# Patient Record
Sex: Male | Born: 1945
Health system: Southern US, Community
[De-identification: ages and names within clinical notes are randomized; demographics above are authoritative.]

## PROBLEM LIST (undated history)

## (undated) ENCOUNTER — Emergency Department (HOSPITAL_COMMUNITY): Admission: EM | Payer: Medicare Other

## (undated) DIAGNOSIS — Z87898 Personal history of other specified conditions: Secondary | ICD-10-CM

## (undated) DIAGNOSIS — F4310 Post-traumatic stress disorder, unspecified: Secondary | ICD-10-CM

## (undated) DIAGNOSIS — F329 Major depressive disorder, single episode, unspecified: Secondary | ICD-10-CM

## (undated) DIAGNOSIS — I10 Essential (primary) hypertension: Secondary | ICD-10-CM

## (undated) DIAGNOSIS — F1010 Alcohol abuse, uncomplicated: Secondary | ICD-10-CM

## (undated) DIAGNOSIS — F1910 Other psychoactive substance abuse, uncomplicated: Secondary | ICD-10-CM

## (undated) DIAGNOSIS — F32A Depression, unspecified: Secondary | ICD-10-CM

## (undated) DIAGNOSIS — R569 Unspecified convulsions: Secondary | ICD-10-CM

## (undated) HISTORY — PX: NO PAST SURGERIES: SHX2092

---

## 2001-05-19 ENCOUNTER — Other Ambulatory Visit (HOSPITAL_COMMUNITY): Admission: RE | Admit: 2001-05-19 | Discharge: 2001-05-30 | Payer: Self-pay | Admitting: Psychiatry

## 2008-09-23 ENCOUNTER — Inpatient Hospital Stay (HOSPITAL_COMMUNITY): Admission: EM | Admit: 2008-09-23 | Discharge: 2008-09-25 | Payer: Self-pay | Admitting: Emergency Medicine

## 2008-09-23 ENCOUNTER — Ambulatory Visit: Payer: Self-pay | Admitting: Internal Medicine

## 2008-09-24 ENCOUNTER — Encounter (INDEPENDENT_AMBULATORY_CARE_PROVIDER_SITE_OTHER): Payer: Self-pay | Admitting: Internal Medicine

## 2009-07-04 ENCOUNTER — Emergency Department (HOSPITAL_COMMUNITY): Admission: EM | Admit: 2009-07-04 | Discharge: 2009-07-04 | Payer: Self-pay | Admitting: Emergency Medicine

## 2009-07-11 ENCOUNTER — Emergency Department (HOSPITAL_COMMUNITY): Admission: EM | Admit: 2009-07-11 | Discharge: 2009-07-11 | Payer: Self-pay | Admitting: Family Medicine

## 2009-09-05 ENCOUNTER — Emergency Department (HOSPITAL_COMMUNITY): Admission: EM | Admit: 2009-09-05 | Discharge: 2009-09-05 | Payer: Self-pay | Admitting: Emergency Medicine

## 2009-11-24 ENCOUNTER — Emergency Department (HOSPITAL_COMMUNITY): Admission: EM | Admit: 2009-11-24 | Discharge: 2009-11-24 | Payer: Self-pay | Admitting: Emergency Medicine

## 2009-12-03 ENCOUNTER — Emergency Department (HOSPITAL_COMMUNITY): Admission: EM | Admit: 2009-12-03 | Discharge: 2009-12-03 | Payer: Self-pay | Admitting: Emergency Medicine

## 2010-05-06 ENCOUNTER — Emergency Department (HOSPITAL_COMMUNITY): Payer: Medicare Other

## 2010-05-06 ENCOUNTER — Inpatient Hospital Stay (HOSPITAL_COMMUNITY)
Admission: EM | Admit: 2010-05-06 | Discharge: 2010-05-12 | DRG: 897 | Disposition: A | Discharge status: Home Health | Payer: Medicare Other | Attending: Family Medicine | Admitting: Family Medicine

## 2010-05-06 DIAGNOSIS — R627 Adult failure to thrive: Secondary | ICD-10-CM | POA: Diagnosis present

## 2010-05-06 DIAGNOSIS — F4310 Post-traumatic stress disorder, unspecified: Secondary | ICD-10-CM | POA: Diagnosis present

## 2010-05-06 DIAGNOSIS — Z9119 Patient's noncompliance with other medical treatment and regimen: Secondary | ICD-10-CM

## 2010-05-06 DIAGNOSIS — F17200 Nicotine dependence, unspecified, uncomplicated: Secondary | ICD-10-CM | POA: Diagnosis present

## 2010-05-06 DIAGNOSIS — R7309 Other abnormal glucose: Secondary | ICD-10-CM | POA: Diagnosis present

## 2010-05-06 DIAGNOSIS — R569 Unspecified convulsions: Secondary | ICD-10-CM | POA: Diagnosis present

## 2010-05-06 DIAGNOSIS — R9431 Abnormal electrocardiogram [ECG] [EKG]: Secondary | ICD-10-CM | POA: Diagnosis present

## 2010-05-06 DIAGNOSIS — I10 Essential (primary) hypertension: Secondary | ICD-10-CM | POA: Diagnosis present

## 2010-05-06 DIAGNOSIS — F1020 Alcohol dependence, uncomplicated: Secondary | ICD-10-CM | POA: Diagnosis present

## 2010-05-06 DIAGNOSIS — F1210 Cannabis abuse, uncomplicated: Secondary | ICD-10-CM | POA: Diagnosis present

## 2010-05-06 DIAGNOSIS — F10939 Alcohol use, unspecified with withdrawal, unspecified: Principal | ICD-10-CM | POA: Diagnosis present

## 2010-05-06 DIAGNOSIS — Z91199 Patient's noncompliance with other medical treatment and regimen due to unspecified reason: Secondary | ICD-10-CM

## 2010-05-06 DIAGNOSIS — F10239 Alcohol dependence with withdrawal, unspecified: Principal | ICD-10-CM | POA: Diagnosis present

## 2010-05-06 LAB — POCT I-STAT, CHEM 8
BUN: 5 mg/dL — ABNORMAL LOW (ref 6–23)
Calcium, Ion: 0.92 mmol/L — ABNORMAL LOW (ref 1.12–1.32)
Creatinine, Ser: 0.7 mg/dL (ref 0.4–1.5)
Glucose, Bld: 267 mg/dL — ABNORMAL HIGH (ref 70–99)
HCT: 39 % (ref 39.0–52.0)
Potassium: 3.8 mEq/L (ref 3.5–5.1)

## 2010-05-06 LAB — CBC
HCT: 33.2 % — ABNORMAL LOW (ref 39.0–52.0)
Hemoglobin: 12.3 g/dL — ABNORMAL LOW (ref 13.0–17.0)
MCHC: 37 g/dL — ABNORMAL HIGH (ref 30.0–36.0)
MCV: 105.1 fL — ABNORMAL HIGH (ref 78.0–100.0)
Platelets: 134 10*3/uL — ABNORMAL LOW (ref 150–400)
RBC: 3.16 MIL/uL — ABNORMAL LOW (ref 4.22–5.81)
RDW: 18 % — ABNORMAL HIGH (ref 11.5–15.5)

## 2010-05-06 LAB — COMPREHENSIVE METABOLIC PANEL
ALT: 27 U/L (ref 0–53)
AST: 115 U/L — ABNORMAL HIGH (ref 0–37)
Albumin: 3.9 g/dL (ref 3.5–5.2)
Alkaline Phosphatase: 85 U/L (ref 39–117)
Calcium: 8.4 mg/dL (ref 8.4–10.5)
Creatinine, Ser: 0.96 mg/dL (ref 0.4–1.5)
GFR calc Af Amer: >60 mL/min (ref >60)
GFR calc non Af Amer: >60 mL/min (ref >60)
Glucose, Bld: 266 mg/dL — ABNORMAL HIGH (ref 70–99)
Potassium: 3.8 mEq/L (ref 3.5–5.1)
Sodium: 134 mEq/L — ABNORMAL LOW (ref 135–145)

## 2010-05-06 LAB — DIFFERENTIAL
Eosinophils Absolute: 0 10*3/uL (ref 0.0–0.7)
Eosinophils Relative: 0 % (ref 0–5)
Lymphocytes Relative: 24 % (ref 12–46)
Lymphs Abs: 1.8 10*3/uL (ref 0.7–4.0)
Monocytes Absolute: 0.3 10*3/uL (ref 0.1–1.0)
Neutro Abs: 5.3 10*3/uL (ref 1.7–7.7)

## 2010-05-06 LAB — CK TOTAL AND CKMB (NOT AT ARMC)
CK, MB: 1.5 ng/mL (ref 0.3–4.0)
CK, MB: 1.7 ng/mL (ref 0.3–4.0)

## 2010-05-06 LAB — OCCULT BLOOD, POC DEVICE: Fecal Occult Bld: NEGATIVE

## 2010-05-07 ENCOUNTER — Inpatient Hospital Stay (HOSPITAL_COMMUNITY): Payer: Medicare Other

## 2010-05-07 DIAGNOSIS — R0789 Other chest pain: Secondary | ICD-10-CM

## 2010-05-07 DIAGNOSIS — R569 Unspecified convulsions: Secondary | ICD-10-CM

## 2010-05-07 DIAGNOSIS — F1020 Alcohol dependence, uncomplicated: Secondary | ICD-10-CM

## 2010-05-07 DIAGNOSIS — F10239 Alcohol dependence with withdrawal, unspecified: Secondary | ICD-10-CM

## 2010-05-07 LAB — PREALBUMIN: Prealbumin: 31 mg/dL (ref 17.0–34.0)

## 2010-05-07 LAB — RAPID URINE DRUG SCREEN, HOSP PERFORMED
Cocaine: NOT DETECTED
Opiates: NOT DETECTED
Tetrahydrocannabinol: POSITIVE — AB

## 2010-05-07 LAB — CBC
HCT: 30.5 % — ABNORMAL LOW (ref 39.0–52.0)
MCV: 103 fL — ABNORMAL HIGH (ref 78.0–100.0)
Platelets: 123 10*3/uL — ABNORMAL LOW (ref 150–400)
RBC: 2.96 MIL/uL — ABNORMAL LOW (ref 4.22–5.81)
WBC: 9.1 10*3/uL (ref 4.0–10.5)

## 2010-05-07 LAB — GLUCOSE, CAPILLARY: Glucose-Capillary: 112 mg/dL — ABNORMAL HIGH (ref 70–99)

## 2010-05-07 LAB — URINALYSIS, ROUTINE W REFLEX MICROSCOPIC
Ketones, ur: 15 mg/dL — AB
Nitrite: NEGATIVE
pH: 6 (ref 5.0–8.0)

## 2010-05-07 LAB — CARDIAC PANEL(CRET KIN+CKTOT+MB+TROPI)
CK, MB: 2.9 ng/mL (ref 0.3–4.0)
Relative Index: 1.6 (ref 0.0–2.5)
Total CK: 150 U/L (ref 7–232)
Troponin I: 0.01 ng/mL (ref 0.00–0.06)

## 2010-05-07 LAB — URINE MICROSCOPIC-ADD ON

## 2010-05-07 LAB — AMMONIA: Ammonia: 40 umol/L — ABNORMAL HIGH (ref 11–35)

## 2010-05-07 LAB — COMPREHENSIVE METABOLIC PANEL
BUN: 1 mg/dL — ABNORMAL LOW (ref 6–23)
Calcium: 8.3 mg/dL — ABNORMAL LOW (ref 8.4–10.5)
Creatinine, Ser: 0.64 mg/dL (ref 0.4–1.5)
Glucose, Bld: 131 mg/dL — ABNORMAL HIGH (ref 70–99)
Total Protein: 7.2 g/dL (ref 6.0–8.3)

## 2010-05-07 LAB — BASIC METABOLIC PANEL
BUN: 3 mg/dL — ABNORMAL LOW (ref 6–23)
CO2: 27 mEq/L (ref 19–32)
Chloride: 104 mEq/L (ref 96–112)
Creatinine, Ser: 0.67 mg/dL (ref 0.4–1.5)
Glucose, Bld: 101 mg/dL — ABNORMAL HIGH (ref 70–99)

## 2010-05-08 LAB — GLUCOSE, CAPILLARY
Glucose-Capillary: 135 mg/dL — ABNORMAL HIGH (ref 70–99)
Glucose-Capillary: 77 mg/dL (ref 70–99)
Glucose-Capillary: 73 mg/dL (ref 70–99)

## 2010-05-08 LAB — COMPREHENSIVE METABOLIC PANEL
Alkaline Phosphatase: 66 U/L (ref 39–117)
BUN: 2 mg/dL — ABNORMAL LOW (ref 6–23)
CO2: 24 mEq/L (ref 19–32)
Chloride: 99 mEq/L (ref 96–112)
Creatinine, Ser: 0.49 mg/dL (ref 0.4–1.5)
GFR calc non Af Amer: >60 mL/min (ref >60)
Total Bilirubin: 1.6 mg/dL — ABNORMAL HIGH (ref 0.3–1.2)

## 2010-05-08 LAB — URINE CULTURE: Special Requests: NEGATIVE

## 2010-05-09 ENCOUNTER — Inpatient Hospital Stay (HOSPITAL_COMMUNITY): Payer: Medicare Other

## 2010-05-09 LAB — COMPREHENSIVE METABOLIC PANEL
AST: 184 U/L — ABNORMAL HIGH (ref 0–37)
Albumin: 3.1 g/dL — ABNORMAL LOW (ref 3.5–5.2)
BUN: 3 mg/dL — ABNORMAL LOW (ref 6–23)
Calcium: 8.4 mg/dL (ref 8.4–10.5)
Chloride: 102 mEq/L (ref 96–112)
Creatinine, Ser: 0.61 mg/dL (ref 0.4–1.5)
GFR calc Af Amer: >60 mL/min (ref >60)
Total Bilirubin: 1.7 mg/dL — ABNORMAL HIGH (ref 0.3–1.2)

## 2010-05-09 LAB — GLUCOSE, CAPILLARY
Glucose-Capillary: 118 mg/dL — ABNORMAL HIGH (ref 70–99)
Glucose-Capillary: 72 mg/dL (ref 70–99)
Glucose-Capillary: 94 mg/dL (ref 70–99)

## 2010-05-09 LAB — CBC
MCH: 38 pg — ABNORMAL HIGH (ref 26.0–34.0)
MCHC: 36.7 g/dL — ABNORMAL HIGH (ref 30.0–36.0)
MCV: 103.5 fL — ABNORMAL HIGH (ref 78.0–100.0)
Platelets: 108 10*3/uL — ABNORMAL LOW (ref 150–400)
RDW: 17.7 % — ABNORMAL HIGH (ref 11.5–15.5)

## 2010-05-10 LAB — GLUCOSE, CAPILLARY
Glucose-Capillary: 120 mg/dL — ABNORMAL HIGH (ref 70–99)
Glucose-Capillary: 102 mg/dL — ABNORMAL HIGH (ref 70–99)

## 2010-05-11 LAB — MAGNESIUM: Magnesium: 1.5 mg/dL (ref 1.5–2.5)

## 2010-05-11 LAB — GLUCOSE, CAPILLARY
Glucose-Capillary: 136 mg/dL — ABNORMAL HIGH (ref 70–99)
Glucose-Capillary: 126 mg/dL — ABNORMAL HIGH (ref 70–99)

## 2010-05-11 LAB — COMPREHENSIVE METABOLIC PANEL
Albumin: 3.1 g/dL — ABNORMAL LOW (ref 3.5–5.2)
BUN: 3 mg/dL — ABNORMAL LOW (ref 6–23)
Calcium: 9.1 mg/dL (ref 8.4–10.5)
Creatinine, Ser: 0.61 mg/dL (ref 0.4–1.5)
Glucose, Bld: 95 mg/dL (ref 70–99)
Total Protein: 6.2 g/dL (ref 6.0–8.3)

## 2010-05-11 LAB — CBC
HCT: 30.1 % — ABNORMAL LOW (ref 39.0–52.0)
MCHC: 36.5 g/dL — ABNORMAL HIGH (ref 30.0–36.0)
MCV: 103.4 fL — ABNORMAL HIGH (ref 78.0–100.0)
Platelets: 107 10*3/uL — ABNORMAL LOW (ref 150–400)
RDW: 17.8 % — ABNORMAL HIGH (ref 11.5–15.5)

## 2010-05-12 LAB — GLUCOSE, CAPILLARY
Glucose-Capillary: 116 mg/dL — ABNORMAL HIGH (ref 70–99)
Glucose-Capillary: 112 mg/dL — ABNORMAL HIGH (ref 70–99)

## 2010-05-20 LAB — DIFFERENTIAL
Basophils Relative: 0 % (ref 0–1)
Eosinophils Relative: 19 % — ABNORMAL HIGH (ref 0–5)
Monocytes Absolute: 0.5 10*3/uL (ref 0.1–1.0)
Monocytes Relative: 6 % (ref 3–12)
Neutro Abs: 3.3 10*3/uL (ref 1.7–7.7)

## 2010-05-20 LAB — COMPREHENSIVE METABOLIC PANEL
AST: 69 U/L — ABNORMAL HIGH (ref 0–37)
Albumin: 3.8 g/dL (ref 3.5–5.2)
Alkaline Phosphatase: 84 U/L (ref 39–117)
BUN: 4 mg/dL — ABNORMAL LOW (ref 6–23)
GFR calc Af Amer: >60 mL/min (ref >60)
Potassium: 3 mEq/L — ABNORMAL LOW (ref 3.5–5.1)
Sodium: 135 mEq/L (ref 135–145)
Total Protein: 7 g/dL (ref 6.0–8.3)

## 2010-05-20 LAB — CBC
HCT: 37.5 % — ABNORMAL LOW (ref 39.0–52.0)
Platelets: 111 10*3/uL — ABNORMAL LOW (ref 150–400)
RDW: 20.6 % — ABNORMAL HIGH (ref 11.5–15.5)
WBC: 8.7 10*3/uL (ref 4.0–10.5)

## 2010-05-20 LAB — POCT CARDIAC MARKERS: Troponin i, poc: <0.05 ng/mL (ref 0.00–0.09)

## 2010-05-20 LAB — ETHANOL: Alcohol, Ethyl (B): <5 mg/dL (ref 0–10)

## 2010-05-20 LAB — PROTIME-INR: INR: 1.16 (ref 0.00–1.49)

## 2010-05-21 NOTE — H&P (Signed)
NAME:  Miguel Hanson, [name]               ACCOUNT NO.:  [id number]  MEDICAL RECORD NO.:  [id number]           PATIENT TYPE:  I  LOCATION:  3310                         FACILITY:  [facility]  PHYSICIAN:  Dalonda Simoni A. Sheffield Slider, [name]    DATE OF BIRTH:  10-20-1945  DATE OF ADMISSION:  05/06/2010 DATE OF DISCHARGE:                             HISTORY & PHYSICAL   PRIMARY CARE PHYSIC[facility] [place], [place].  CHIEF COMPLAINT:  Seizure, altered mental status.  HISTORY OF PRESENT ILLNESS:  This is a 65 year old male with a history of chronic alcohol abuse, who presented to the emergency department with altered mental status and seizures beginning today.  The patient was initially found by family members earlier this morning with altered mental status consisting of episodes of staring into space and combativeness.  The patient had been vomiting overnight and early throughout the day prior to this decline in mental status.  The wife describes the patient as "his mouth became twisted" and was fiddling around the kitchen and mumbling incoherently.  EMS was called to the home and on arrival, reportedly the patient was alert and responsive initially.  In route to the hospital, the patient had a generalized tonic-clonic seizure and was postictal on arrival.  He was dosed with Ativan 2 mg IV and was slowly regaining mentation in the emergency department. He was also noted to be hypertensive with tachycardia.  Blood pressure 181/132 and pulse was 111, and this had improved with Ativan dosing.  Notably, the patient has a long history of alcohol abuse and drinks Wild [place] [employer] wine and brandy in large quantities on a daily basis.  He had continued drinking throughout the night last night and this morning despite his severe vomiting. Has been a chronic alcoholic for many decades, and has been to rehab several times previously.  REVIEW OF SYSTEMS:  Positive for chronic nonproductive cough and questionable  blood in stool per wife.  Endorses nausea, vomiting, mild dyspnea, and polyuria.  Denies fall or recent trauma, abdominal pain, diarrhea.  Note:  Review of systems was obtained per wife's history as the patient is lethargic and does not answer questions, level 5 caveat.  PAST MEDICAL HISTORY: 1. Hypertension. 2. Alcohol abuse with question of seizure 1 year ago, has multiple     rehab admissions. 3. C3 fracture in 2010 due to fall. 4. History of prolonged QT. 5. PTSD.  Psychiatrist is Dr. Cory Roughen in [facility].  PAST SURGICAL HISTORY:  None.  SOCIAL HISTORY:  The patient lives with wife and grandson in [place]. 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  He smokes half a pack cigarettes per day. Drinks Wild [employer] and brandy on a daily basis, sometimes purchasing these 3 times a day.  Drug use positive for marijuana only.  FAMILY HISTORY:  Hypertension and lupus in a brother, otherwise unknown.  ALLERGIES:  No known drug allergies.  MEDICATIONS: 1. Sertraline 100 mg p.o. at bedtime. 2. Trazodone 50 mg p.o. at bedtime.  PHYSICAL EXAMINATION:  VITAL SIGNS:  Temperature 97.8, pulse 89, respirations 21, BP 159/105, O2 sat 97% on room air. GENERAL  APPEARANCE:  The patient is lethargic, lying flat in bed, and cachectic. HEENT:  Normocephalic, atraumatic.  PERRLA.  EOMI.  Mucous membranes are moist and very poor dentition.  No oropharyngeal lesion.  Mild scleral icterus. CARDIOVASCULAR:  Regular rate and rhythm.  No murmurs. LUNGS:  Nonlabored respirations.  No rales auscultated.  Breath sounds are distant and respirations are shallow. ABDOMEN:  Soft.  Bowel sounds positive, nontender, nondistended.  No palpable hepatomegaly. GU:  Deferred. RECTAL:  No blood or stool in vault, and no external abnormalities. EXTREMITIES:  No edema, 2+ bilateral DP pulses, and cachectic appearing. NEUROLOGIC:  The patient responds slowly to commands.  5/5 bilateral upper extremity strength.  Cranial nerves  II through XII grossly intact. No gross motor deficits.  LABS AND STUDIES: 1. Sodium 134, potassium 3.8, chloride 97, bicarb 20, BUN 4,     creatinine 0.96, glucose 266.  AST 115, ALT 27, alkaline     phosphatase 85, total protein 7.7, albumin 3.9.  White blood count     7.4, hemoglobin 12.3, hematocrit 33.2, platelets 134, MCV 105. 2. Troponin I 0.01, CK 63, CK-MB 1.5. 3. Alcohol level less than 5. 4. Ammonia 66. 5. INR 1.26, PT 16.  IMAGING: 1. Chest x-ray exam limited, probably no cardiopulmonary disease. 2. Head CT limited by motion artifact. 3. EKG shows normal sinus rhythm with 75 beats per minute and no     ischemic changes.  ASSESSMENT AND PLAN:  This is a 65 year old male with a history of chronic alcohol abuse, who presents with acute alcohol withdrawal seizures. 1. Seizures and withdrawal:  Given the patient's history, his seizures     are most likely secondary to acute alcohol withdrawal.  Per     history, the patient continued drinking today, however, his     alcohol level was less than 5 and perhaps ingestion was limited due     to vomiting.  CT scan was nonproductive as the patient was unable     to lie still during the study, and this will need to be repeated     after the acute phase is over.  Electrolytes and lab studies showed     no other notable etiology for seizures at this time, therefore CT and     will be helpful in ruling out intracranial causes.  The patient was dosed     with Ativan 2 mg on arrival to the emergency department and is likley postictal now.  We will start a CIWA scoring q.6 h per     protocol and will give an additional Ativan 1-2 mg q.2 h p.r.n. for     agitation or seizure activity.  We will keep the patient n.p.o.     with acute altered mental status and start IV fluid hydration,     thiamine, vitamin B1, and multivitamin.  For development of     autonomic symptoms, we will consider addition of clonidine if     hypertension remains  uncontrolled.  We will check UDS, lipase,     Tylenol level with an acute altered mental status.  With his severe     hypertension and agitation, we will cycle cardiac enzymes and     repeat EKG in the a.m.  Notably, his initial set of cardiac enzymes     were negative.  However, point-of-care testing showed mild positive     troponin.  No signs of EKG changes.  2. Hypertension:  Ativan is improving his pressure as this  was     reported to be 181/132 prior to dosing.  The patient likely has a     chronic hypertension, but was untreated as he seems to have refused     most medical care in recent history.  We will give labetalol 10 mg     IV q.6 h p.r.n. for systolic blood pressure greater than 180 and     diastolic blood pressure greater than 100.  The patient will likely     need oral agent after his acute phase is resolved.  3. Hyperammonemia:  The patient is lethargic, most likely secondary to     alcohol versus postictal state.  This is possibly related to     hepatic encephalopathy.  If mental status does not improve     overnight, we will consider starting lactulose.  Repeat ammonia     level in the a.m.  4. Hyperglycemia:  Likely an effect of alcohol versus undiagnosed     diabetes mellitus.  We will check A1c and use sensitive sliding     scale insulin for coverage of CBGs.  5. History of posttraumatic stress disorder:  We will continue home     dose sertraline for now and hold trazodone.  Consider changing this     trazodone to Remeron for potential appetite side effects when the     patient is stable.  6. Tobacco abuse:  We will start nicotine patch 14 mg per day.     Notably, the patient has a chronic cough and we will try to repeat     chest x-ray when he is able to participate with a more detailed PA     and lateral study.  7. Fluids, electrolytes, nutrition:  The patient will be n.p.o. with     acute altered mental status and seizure.  We will start normal     saline  at 125 mL/hour.  Electrolytes are within normal limits     currently, and we will recheck BMP in the a.m.  With multiple manifestations     indicating chronic alcohol disease including mild transaminitis and     macrocytic anemia, we will start vitamin B1, thiamine, and folate.     We will check prealbumin given manifestations of malnutrition and     consider Nutrition consult if indicated.  8. Prophylaxis:  Heparin subcutaneously 5000 units q.8 for venous     thromboembolism prophylaxis and Protonix IV daily until the patient     can tolerate oral.  9. Disposition:  We will admit to step-down unit for acute alcohol     withdrawal and seizure.  The patient notably has had several     alcohol rehab encounters in the past, and we will again offer this     as an option once his acute withdrawal has subsided and he is     medically stable.  The patient's family desires full code status.     We will obtain PT/OT consult when stable.    ______________________________ Lloyd Huger, MD   ______________________________ Arnette Norris. Sheffield Slider, [name]    Cleotis Lema  D:  05/06/2010  T:  05/07/2010  Job:  [id number]  Electronically Signed by Lloyd Huger MD on 05/13/2010 12:05:54 AM Electronically Signed by Zachery Dauer [name] on 05/21/2010 04:25:50 PM

## 2010-05-22 NOTE — Discharge Summary (Signed)
Miguel Hanson, Miguel Hanson               ACCOUNT NO.:  [id number]  MEDICAL RECORD NO.:  [id number]           PATIENT TYPE:  I  LOCATION:  4736                         FACILITY:  [facility]  PHYSICIAN:  Leighton Roach Miguel Hanson, M.D.DATE OF BIRTH:  March 30, 1945  DATE OF ADMISSION:  05/06/2010 DATE OF DISCHARGE:  05/12/2010                              DISCHARGE SUMMARY   DISCHARGE DIAGNOSES: 1. Alcohol withdrawal with seizure. 2. Chronic alcoholism. 3. Hypertension. 4. Post-traumatic stress disorder. 5. Prolonged QT  DISCHARGE MEDICATIONS: 1. Librium 25 mg capsule 1 tablet p.o. daily times 1 day. 2. Folic acid 1 mg p.o. daily. 3. Ensure nutritional supplement p.o. b.i.d. p.r.n. 4. Lisinopril/HCTZ 20/12.5 mg tablets p.o. daily. 5. Multivitamin. 6. NicoDerm 14 mg q.24-hour patch transdermally daily. 7. Omeprazole 20 mg p.o. daily. 8. Thiamine 100 mg p.o. daily. 9. Sertraline 100 mg p.o. daily.  MEDICATIONS DISCONTINUED:  Trazodone 50 mg p.o. nightly.  LAB PROCEDURES: 1. LFTs at time of discharge, AST 45, ALT 32, alkaline phosphatase 73. 2. Hemoglobin 12.3 with MCV 105, platelets 134. 3. Fecal occult blood negative. 4. Chest x-ray showing no acute disease. 5. Head CT showing no acute intracranial abnormalities, but limited by     motion artifact.  CONSULTS: 1. Social work. 2. PT/OT  BRIEF HOSPITAL COURSE:  This is a 65 year old male with a history of chronic alcohol abuse and PTSD, who presented with generalized tonic- clonic seizure secondary to alcohol withdrawal. 1. Alcohol withdrawal seizure.  The patient was admitted after 2     occurrences of generalized tonic-clonic seizure and upon arrival to     the emergency department received two doses of Ativan.  The patient     experienced no further seizure activity. Head CT and electrolytes were     negative for other organic causes of seizure.  Per history, the     seizures were likely induced by recent nausea and vomiting in  conjunction with chronic alcohol use, and on arrival ethanol level     was less than 5 consistent with this history.  The patient had     LFTs consistent with alcoholism with AST 115 and ALT of 27 and     elevated ammonia of 66, and the patient went on to develop outright     alcohol withdrawal on the day after hospitalization.  The patient     developed severe agitation, tremor, psychomotor instability with     hypertension, and tachycardia, and he required multiple doses of     Ativan for several days.  The patient was monitored in step-down     unit during the course of his acute withdrawal and was transitioned     to longer acting Librium for a short taper.  At the time of     discharge, the patient had no further signs or symptoms of     withdrawal and was normotensive.  He has one more day of Librium to     complete his short taper.  The patient was inconsistent with his     desires for or against substance abuse rehab, at times agreeing this would be beneficial  and     at other times desiring only to return home as he is failed many     times rehab in the past. 2. Hypertension.  The patient was found to be hypertensive on arrival     and developed even more severe hypertension during his withdrawal.     The patient has a history of hypertension, however, was     noncompliant with his medications prior to admission.  The patient     was started on lisinopril 20 mg and HCTZ 12.5 mg during this     hospitalization.  Blood pressures were improved, ranging from 110     to 160 systolic on this regimen.  This may require further     titration as an outpatient. 3. Psychosocial.  Social work consult was obtained to counsel Mr.     Cathey regarding benefits of alcoholic rehab.  Again,     the patient wavered on his desire and was not hopeful for recovery     given his history of greater than 4 decades of alcohol abuse.  The     patient was counseled on harmful effects of alcohol and  informed     of the risks and benefits of this course.  Additionally, physical     therapy evaluated the patient and recommended 24-hour assistance after discharge.  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  The patient was     willing to discuss these options, however, remained noncommittal in     his desire to receive services.  FOLLOWUP APPOINTMENTS:  The patient will make appointment with his primary doctor at the [facility] in [place], [place].  FOLLOWUP ISSUES: 1. Alcohol abstinence and/or rehabilitation. 2. Hypertension.  The patient was discharged to home with his wife in stable medical condition.    ______________________________ Miguel Huger, MD   ______________________________ Leighton Roach Miguel Hanson, [name]    JK/MEDQ  D:  05/12/2010  T:  05/13/2010  Job:  [id number]  Electronically Signed by Miguel Huger MD on 05/16/2010 08:50:31 PM Electronically Signed by Miguel Hanson [name] on 05/22/2010 01:37:16 PM

## 2010-06-08 LAB — CARDIAC PANEL(CRET KIN+CKTOT+MB+TROPI)
Relative Index: 1 (ref 0.0–2.5)
Total CK: 149 U/L (ref 7–232)
Troponin I: 0.01 ng/mL (ref 0.00–0.06)

## 2010-06-08 LAB — BASIC METABOLIC PANEL
BUN: <1 mg/dL — ABNORMAL LOW (ref 6–23)
BUN: <1 mg/dL — ABNORMAL LOW (ref 6–23)
CO2: 29 mEq/L (ref 19–32)
Chloride: 101 mEq/L (ref 96–112)
Chloride: 102 mEq/L (ref 96–112)
Creatinine, Ser: 0.52 mg/dL (ref 0.4–1.5)
GFR calc non Af Amer: >60 mL/min (ref >60)
GFR calc non Af Amer: >60 mL/min (ref >60)
Glucose, Bld: 136 mg/dL — ABNORMAL HIGH (ref 70–99)
Potassium: 3.3 mEq/L — ABNORMAL LOW (ref 3.5–5.1)
Potassium: 3.3 mEq/L — ABNORMAL LOW (ref 3.5–5.1)
Potassium: 3.5 mEq/L (ref 3.5–5.1)
Sodium: 135 mEq/L (ref 135–145)
Sodium: 137 mEq/L (ref 135–145)

## 2010-06-08 LAB — CBC
HCT: 35.2 % — ABNORMAL LOW (ref 39.0–52.0)
HCT: 35.7 % — ABNORMAL LOW (ref 39.0–52.0)
HCT: 36.4 % — ABNORMAL LOW (ref 39.0–52.0)
HCT: 36.9 % — ABNORMAL LOW (ref 39.0–52.0)
Hemoglobin: 12.3 g/dL — ABNORMAL LOW (ref 13.0–17.0)
Hemoglobin: 12.6 g/dL — ABNORMAL LOW (ref 13.0–17.0)
Hemoglobin: 12.9 g/dL — ABNORMAL LOW (ref 13.0–17.0)
MCHC: 34.2 g/dL (ref 30.0–36.0)
MCV: 117.8 fL — ABNORMAL HIGH (ref 78.0–100.0)
MCV: 119 fL — ABNORMAL HIGH (ref 78.0–100.0)
MCV: 119.1 fL — ABNORMAL HIGH (ref 78.0–100.0)
Platelets: 102 10*3/uL — ABNORMAL LOW (ref 150–400)
Platelets: 110 10*3/uL — ABNORMAL LOW (ref 150–400)
RBC: 3.03 MIL/uL — ABNORMAL LOW (ref 4.22–5.81)
RBC: 3.06 MIL/uL — ABNORMAL LOW (ref 4.22–5.81)
RBC: 3.08 MIL/uL — ABNORMAL LOW (ref 4.22–5.81)
RDW: 15.9 % — ABNORMAL HIGH (ref 11.5–15.5)
WBC: 10.2 10*3/uL (ref 4.0–10.5)
WBC: 10.9 10*3/uL — ABNORMAL HIGH (ref 4.0–10.5)
WBC: 10.9 10*3/uL — ABNORMAL HIGH (ref 4.0–10.5)
WBC: 9.5 10*3/uL (ref 4.0–10.5)

## 2010-06-08 LAB — PHOSPHORUS: Phosphorus: 2.6 mg/dL (ref 2.3–4.6)

## 2010-06-08 LAB — DIFFERENTIAL
Basophils Relative: 1 % (ref 0–1)
Basophils Relative: 1 % (ref 0–1)
Eosinophils Absolute: 0.9 10*3/uL — ABNORMAL HIGH (ref 0.0–0.7)
Eosinophils Relative: 17 % — ABNORMAL HIGH (ref 0–5)
Eosinophils Relative: 3 % (ref 0–5)
Eosinophils Relative: 9 % — ABNORMAL HIGH (ref 0–5)
Lymphs Abs: 2.6 10*3/uL (ref 0.7–4.0)
Lymphs Abs: 4.8 10*3/uL — ABNORMAL HIGH (ref 0.7–4.0)
Monocytes Absolute: 0.7 10*3/uL (ref 0.1–1.0)
Monocytes Absolute: 0.7 10*3/uL (ref 0.1–1.0)
Monocytes Relative: 6 % (ref 3–12)
Monocytes Relative: 7 % (ref 3–12)
Monocytes Relative: 7 % (ref 3–12)
Neutro Abs: 3.4 10*3/uL (ref 1.7–7.7)
Neutro Abs: 5.8 10*3/uL (ref 1.7–7.7)
Neutro Abs: 6.2 10*3/uL (ref 1.7–7.7)
Neutrophils Relative %: 60 % (ref 43–77)
nRBC: 0 /100 WBC

## 2010-06-08 LAB — LIPID PANEL
HDL: 54 mg/dL (ref >39)
LDL Cholesterol: 63 mg/dL (ref 0–99)
Total CHOL/HDL Ratio: 2.4 RATIO
VLDL: 13 mg/dL (ref 0–40)

## 2010-06-08 LAB — POCT CARDIAC MARKERS
CKMB, poc: <1 ng/mL — ABNORMAL LOW (ref 1.0–8.0)
Myoglobin, poc: 88.7 ng/mL (ref 12–200)

## 2010-06-08 LAB — GLUCOSE, CAPILLARY: Glucose-Capillary: 118 mg/dL — ABNORMAL HIGH (ref 70–99)

## 2010-06-08 LAB — TROPONIN I: Troponin I: 0.04 ng/mL (ref 0.00–0.06)

## 2010-06-08 LAB — CK TOTAL AND CKMB (NOT AT ARMC): CK, MB: 0.7 ng/mL (ref 0.3–4.0)

## 2010-06-08 LAB — MAGNESIUM
Magnesium: 1.8 mg/dL (ref 1.5–2.5)
Magnesium: 2 mg/dL (ref 1.5–2.5)

## 2010-06-08 LAB — COMPREHENSIVE METABOLIC PANEL
BUN: 4 mg/dL — ABNORMAL LOW (ref 6–23)
Calcium: 8.8 mg/dL (ref 8.4–10.5)
GFR calc non Af Amer: >60 mL/min (ref >60)
Glucose, Bld: 135 mg/dL — ABNORMAL HIGH (ref 70–99)
Total Protein: 6.4 g/dL (ref 6.0–8.3)

## 2010-06-08 LAB — TSH: TSH: 0.467 u[IU]/mL (ref 0.350–4.500)

## 2010-07-15 NOTE — Consult Note (Signed)
Miguel Hanson, Miguel Hanson               ACCOUNT NO.:  [id number]   MEDICAL RECORD NO.:  [id number]          PATIENT TYPE:  INP   LOCATION:  4735                         FACILITY:  [facility]   PHYSICIAN:  Kathaleen Maser. Pool, [name]    DATE OF BIRTH:  Apr 16, 1945   DATE OF CONSULTATION:  DATE OF DISCHARGE:                                 CONSULTATION   REASON FOR CONSULTATION:  Anterior inferior chip/avulsion fracture of C3  without spinal cord injury.   HISTORY OF PRESENT ILLNESS:  Miguel Hanson is a 65 year old black male with  history of posttraumatic stress disorder, hypertension, and alcoholism  who presents after a syncopal/near syncopal spell with a resultant fall.  The patient suffered a laceration to his left brow.  The patient has  been conscious throughout his time following the fall.  He shows no  other signs of injury.  He does complain of some mild neck pain.  He  denies any upper or lower extremity symptoms.   PHYSICAL EXAMINATION:  GENERAL:  On exam, he is awake and alert,  reasonably oriented, and appropriate.  NEUROLOGIC:  Cranial nerve function is intact.  Extremities are normal.  Deep tendon reflexes are normoactive.  There is no evidence of long-  track signs.  The patient is able to stand and ambulate without  difficulty.  He is currently in a [place] J collar.  He has moderate mid  cervical tenderness, but minimal spasm.  HEENT:  Examination of his head, ears, eyes, and nose demonstrates a  small left frontal laceration which has been closed primarily.   CT scan of the cervical spine demonstrates an anterior inferior chip  fracture of C3 without significant extension into the body.  There is no  evidence of obvious ligamentous disruption or instability.  The spinal  canal itself is well preserved without complication.  There is no  evidence of other injury.   The patient has suffered an anterior inferior fracture of the body of  C3.  This is something that should heal with  external immobilization.  The patient is to be in a [place] J collar for least 8 weeks, possibly as  long as 12.  I will plan on following him up as an outpatient.            ______________________________  Kathaleen Maser. Pool, [name]     HAP/MEDQ  D:  09/23/2008  T:  09/24/2008  Job:  [id number]
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
## 2010-08-25 ENCOUNTER — Encounter: Payer: Self-pay | Admitting: Internal Medicine

## 2010-08-25 ENCOUNTER — Encounter (HOSPITAL_COMMUNITY): Payer: Self-pay | Admitting: Radiology

## 2010-08-25 ENCOUNTER — Emergency Department (HOSPITAL_COMMUNITY): Payer: Medicare Other

## 2010-08-25 ENCOUNTER — Inpatient Hospital Stay (HOSPITAL_COMMUNITY)
Admission: EM | Admit: 2010-08-25 | Discharge: 2010-08-26 | DRG: 897 | Disposition: A | Discharge status: Home / Self Care | Payer: Medicare Other | Attending: Internal Medicine | Admitting: Internal Medicine

## 2010-08-25 DIAGNOSIS — R569 Unspecified convulsions: Secondary | ICD-10-CM | POA: Diagnosis present

## 2010-08-25 DIAGNOSIS — D696 Thrombocytopenia, unspecified: Secondary | ICD-10-CM | POA: Diagnosis present

## 2010-08-25 DIAGNOSIS — F10239 Alcohol dependence with withdrawal, unspecified: Principal | ICD-10-CM | POA: Diagnosis present

## 2010-08-25 DIAGNOSIS — F4310 Post-traumatic stress disorder, unspecified: Secondary | ICD-10-CM | POA: Diagnosis present

## 2010-08-25 DIAGNOSIS — Z781 Physical restraint status: Secondary | ICD-10-CM | POA: Diagnosis present

## 2010-08-25 DIAGNOSIS — F17200 Nicotine dependence, unspecified, uncomplicated: Secondary | ICD-10-CM | POA: Diagnosis present

## 2010-08-25 DIAGNOSIS — I10 Essential (primary) hypertension: Secondary | ICD-10-CM | POA: Diagnosis present

## 2010-08-25 DIAGNOSIS — D72829 Elevated white blood cell count, unspecified: Secondary | ICD-10-CM | POA: Diagnosis present

## 2010-08-25 DIAGNOSIS — F10939 Alcohol use, unspecified with withdrawal, unspecified: Principal | ICD-10-CM | POA: Diagnosis present

## 2010-08-25 DIAGNOSIS — E0590 Thyrotoxicosis, unspecified without thyrotoxic crisis or storm: Secondary | ICD-10-CM | POA: Diagnosis present

## 2010-08-25 HISTORY — DX: Essential (primary) hypertension: I10

## 2010-08-25 LAB — MAGNESIUM: Magnesium: 1.3 mg/dL — ABNORMAL LOW (ref 1.5–2.5)

## 2010-08-25 LAB — CBC
MCH: 36.8 pg — ABNORMAL HIGH (ref 26.0–34.0)
MCHC: 37.4 g/dL — ABNORMAL HIGH (ref 30.0–36.0)
MCV: 98.4 fL (ref 78.0–100.0)
Platelets: 143 10*3/uL — ABNORMAL LOW (ref 150–400)
RDW: 17.1 % — ABNORMAL HIGH (ref 11.5–15.5)

## 2010-08-25 LAB — URINALYSIS, ROUTINE W REFLEX MICROSCOPIC
Bilirubin Urine: NEGATIVE
Glucose, UA: NEGATIVE mg/dL
Hgb urine dipstick: NEGATIVE
Ketones, ur: NEGATIVE mg/dL
Leukocytes, UA: NEGATIVE
Nitrite: NEGATIVE
Protein, ur: NEGATIVE mg/dL
Specific Gravity, Urine: 1.014 (ref 1.005–1.030)
Urobilinogen, UA: 0.2 mg/dL (ref 0.0–1.0)
pH: 5.5 (ref 5.0–8.0)

## 2010-08-25 LAB — COMPREHENSIVE METABOLIC PANEL
ALT: 21 U/L (ref 0–53)
AST: 45 U/L — ABNORMAL HIGH (ref 0–37)
Albumin: 3.5 g/dL (ref 3.5–5.2)
BUN: 3 mg/dL — ABNORMAL LOW (ref 6–23)
CO2: 22 mEq/L (ref 19–32)
Calcium: 7.5 mg/dL — ABNORMAL LOW (ref 8.4–10.5)
Chloride: 103 mEq/L (ref 96–112)
Creatinine, Ser: 0.54 mg/dL (ref 0.50–1.35)
GFR calc non Af Amer: >60 mL/min (ref >60)
Glucose, Bld: 128 mg/dL — ABNORMAL HIGH (ref 70–99)
Sodium: 137 mEq/L (ref 135–145)
Total Bilirubin: 0.6 mg/dL (ref 0.3–1.2)
Total Protein: 6.9 g/dL (ref 6.0–8.3)

## 2010-08-25 LAB — BLOOD GAS, ARTERIAL
Acid-base deficit: 3.8 mmol/L — ABNORMAL HIGH (ref 0.0–2.0)
Drawn by: 32526
O2 Content: 2 L/min
pCO2 arterial: 30.1 mmHg — ABNORMAL LOW (ref 35.0–45.0)
pH, Arterial: 7.432 (ref 7.350–7.450)
pO2, Arterial: 117 mmHg — ABNORMAL HIGH (ref 80.0–100.0)

## 2010-08-25 LAB — DIFFERENTIAL
Basophils Absolute: 0 K/uL (ref 0.0–0.1)
Basophils Relative: 0 % (ref 0–1)
Eosinophils Absolute: 0.3 K/uL (ref 0.0–0.7)
Eosinophils Relative: 3 % (ref 0–5)
Lymphocytes Relative: 28 % (ref 12–46)
Lymphs Abs: 3.1 K/uL (ref 0.7–4.0)
Monocytes Absolute: 0.5 K/uL (ref 0.1–1.0)
Monocytes Relative: 5 % (ref 3–12)
Neutro Abs: 7 K/uL (ref 1.7–7.7)
Neutrophils Relative %: 64 % (ref 43–77)
Smear Review: DECREASED

## 2010-08-25 LAB — RAPID URINE DRUG SCREEN, HOSP PERFORMED
Amphetamines: NOT DETECTED
Barbiturates: NOT DETECTED
Benzodiazepines: POSITIVE — AB
Cocaine: NOT DETECTED
Opiates: NOT DETECTED
Tetrahydrocannabinol: NOT DETECTED

## 2010-08-25 LAB — ACETAMINOPHEN LEVEL: Acetaminophen (Tylenol), Serum: <15 ug/mL (ref 10–30)

## 2010-08-25 LAB — POCT I-STAT, CHEM 8
Chloride: 105 mEq/L (ref 96–112)
Creatinine, Ser: 0.5 mg/dL (ref 0.50–1.35)
Glucose, Bld: 177 mg/dL — ABNORMAL HIGH (ref 70–99)
HCT: 46 % (ref 39.0–52.0)
Potassium: 4.5 mEq/L (ref 3.5–5.1)

## 2010-08-25 LAB — ETHANOL: Alcohol, Ethyl (B): <11 mg/dL (ref 0–11)

## 2010-08-25 LAB — HEPATIC FUNCTION PANEL
ALT: 25 U/L (ref 0–53)
AST: 73 U/L — ABNORMAL HIGH (ref 0–37)
Albumin: 3.9 g/dL (ref 3.5–5.2)
Alkaline Phosphatase: 85 U/L (ref 39–117)
Bilirubin, Direct: <0.1 mg/dL (ref 0.0–0.3)
Total Bilirubin: 0.4 mg/dL (ref 0.3–1.2)
Total Protein: 7.6 g/dL (ref 6.0–8.3)

## 2010-08-25 LAB — HEMOGLOBIN A1C
Hgb A1c MFr Bld: 5.1 % (ref <5.7)
Mean Plasma Glucose: 100 mg/dL (ref <117)

## 2010-08-25 LAB — CK TOTAL AND CKMB (NOT AT ARMC)
CK, MB: 1.5 ng/mL (ref 0.3–4.0)
Relative Index: INVALID (ref 0.0–2.5)

## 2010-08-25 LAB — TROPONIN I: Troponin I: <0.3 ng/mL

## 2010-08-25 LAB — LACTIC ACID, PLASMA: Lactic Acid, Venous: 10.2 mmol/L — ABNORMAL HIGH (ref 0.5–2.2)

## 2010-08-25 LAB — LIPASE, BLOOD: Lipase: 5 U/L — ABNORMAL LOW (ref 11–59)
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
## 2010-08-26 ENCOUNTER — Other Ambulatory Visit (HOSPITAL_COMMUNITY): Payer: Medicare Other

## 2010-08-26 DIAGNOSIS — R569 Unspecified convulsions: Secondary | ICD-10-CM

## 2010-08-26 LAB — CBC
HCT: 34.7 % — ABNORMAL LOW (ref 39.0–52.0)
Hemoglobin: 13 g/dL (ref 13.0–17.0)
MCH: 36.7 pg — ABNORMAL HIGH (ref 26.0–34.0)
MCHC: 37.5 g/dL — ABNORMAL HIGH (ref 30.0–36.0)
MCV: 98 fL (ref 78.0–100.0)
RDW: 17.1 % — ABNORMAL HIGH (ref 11.5–15.5)

## 2010-08-26 LAB — BASIC METABOLIC PANEL
CO2: 22 mEq/L (ref 19–32)
Calcium: 7.7 mg/dL — ABNORMAL LOW (ref 8.4–10.5)
Glucose, Bld: 79 mg/dL (ref 70–99)
Potassium: 3.3 mEq/L — ABNORMAL LOW (ref 3.5–5.1)
Sodium: 134 mEq/L — ABNORMAL LOW (ref 135–145)

## 2010-08-26 LAB — MAGNESIUM: Magnesium: 1.8 mg/dL (ref 1.5–2.5)

## 2010-08-26 LAB — LACTIC ACID, PLASMA: Lactic Acid, Venous: 1.6 mmol/L (ref 0.5–2.2)

## 2010-08-27 LAB — URINE CULTURE: Culture  Setup Time: 201206260016

## 2010-08-31 LAB — CULTURE, BLOOD (ROUTINE X 2)
Culture  Setup Time: 201206251427
Culture: NO GROWTH
Culture: NO GROWTH

## 2010-10-09 NOTE — Discharge Summary (Signed)
Miguel Hanson, Miguel Hanson NO.:  [id number]  MEDICAL RECORD NO.:  [id number]  LOCATION:  3302                         FACILITY:  [facility]  PHYSICIAN:  Blanch Media, M.D.DATE OF BIRTH:  [birth year]  DATE OF ADMISSION:  08/25/2010 DATE OF DISCHARGE:  08/26/2010                              DISCHARGE SUMMARY   DISCHARGE DIAGNOSES: 1. Seizure, secondary to alcohol withdrawal. 2. Polysubstance abuse. 3. Hypertension. 4. Posttraumatic stress disorder.  DISCHARGE MEDICATIONS: 1. Protonix 40 mg p.o. b.i.d. 2. Folic acid 1 mg p.o. daily. 3. Thiamine 100 mg p.o. daily. 4. Multivitamin p.o. daily. 5. Librium 25 mg p.o. daily. 6. Lisinopril/hydrochlorothiazide 20/12.5 mg p.o. daily. 7. Sertraline 100 mg p.o. daily  DISPOSITION AND FOLLOWUP:  [name] was discharged from [facility] on August 26, 2010, in stable and improved condition.  He had no other episodes of seizure, tremors, nausea, vomiting, fever, or tachycardia.  He will need to continue to take his Librium, folic acid, and thiamine as well as his sertraline.  He will need to follow up with his primary care provider at the [facility] in [place] in 1-2 weeks.  He reported that he does not remember the name of his primary care physician, therefore we could not a schedule an appointment for him prior to discharge.  At that time, the physician should reevaluate him for recurrent seizures and continue to counsel him on polysubstance abuse  as he may benefit from participation in AA or any other outpatient rehab.  Also follow up on his blood pressure.  CONSULTATION:  None.  PROCEDURE PERFORMED: 1. Chest x-ray on August 25, 2010, shows no acute cardiopulmonary     findings. 2. CT of head without contrast on August 25, 2010, shows no acute intra     cranial abnormality.  No significant change.  Stable atrophy and     chronic white matter disease. 3. EKG on August 25, 2010, shows heart rate of 87, no acute ST-T wave   abnormalities.  There is LVH.  ADMISSION HISTORY:  [name] is a 65 year old man with a past medical history of PTSD, hypertension, chronic alcoholism, and multiple seizure episodes in the past who presented with a seizure.  He was not able to give a history because of his altered mental status and was very combative in the ED.  Per ED and EMS report, he had 2 witnessed episodes of seizure in the ED and in EMS en route, which was described as generalized tonic-clonic type.  Of note, the record in e- chart showed that he has had multiple seizures in the past secondary to  alcohol withdrawal.  PHYSICAL EXAMINATION ON ADMISSION: VITAL SIGNS:  Temperature 98.0, pulse 90, blood pressure 139/86,  respiration 18, O2 saturation 100% on 2 liters nasal cannula. GENERAL:  Very disheveled, combative, agitated, and somnolent but arousable to sternal rub. HEAD:  Normocephalic and atraumatic. EYES:  Pupils are equal, round, and reactive to light and accommodation. Extraocular movements intact.  No signs of anemia or jaundice. NOSE:  Mucous membranes dry, noninflamed, nonerythematous. THROAT:  Oropharynx nonerythematous, no exudate. NECK:  No deformities, masses, or tenderness noted.  Supple.  No carotid bruits or JVD.  LUNGS:  Normal respiratory effort.  Clear to auscultation bilaterally, but decreased at bases without crackle or wheezes. HEART:  Tachycardic, S1 and S2, regular rhythm.  No murmurs, gallops, or rubs. ABDOMEN:  Soft, normal bowel sounds, nondistended, nontender.  No organomegaly. EXTREMITIES:  No pretibial edema. NEUROLOGIC:  Limited neuro exam secondary to altered mental status.  Can follow commands.  Cranial nerves II-XII grossly intact.  Motor strength 5/5 in upper and lower extremities.  Could not assess gait or asterixis. SKIN:  No scars or rashes.  Xeroderma.  ADMISSION LABORATORY DATA:  Sodium 137, potassium 3.3, chloride 103, bicarb 22, BUN 3, creatinine 0.54, glucose  128, T-bili 0.6, alk phos 68, AST 45, ALT 21, total protein 6.9, albumin 3.5, and calcium 7.5.  WBC 10.9, hemoglobin 14.2, hematocrit 38, platelets 143.  UA was negative. UDS positive for benzodiazepines.  Acetaminophen level less than 15, lipase 5, magnesium 1.3.  Lactic acid 10.2.  Alcohol less than 11.  CK total 95, CK-MB 1.5, and troponin less than 0.30.  HOSPITAL COURSE: 1. Generalized tonic-clonic seizure secondary to alcohol withdrawal.     [name] was admitted to the Step-Down Unit and was given normal     saline at 100 mL per hour.  He was then placed on CIWA protocol     with Ativan.  An EEG was not warranted because of the low probability     of capturing an active seizure.  We repleted his electrolyte     abnormalities including hypomagnesemum 1.3 and potassium.  He     remained seizure-free throughout hospital course and his     mental status improved as he was alert and oriented x 3 at the time     of discharge.  2. Polysubstance abuse including tobacco and alcohol use.  [name]     was placed on CIWA protocol and a nicotine patch and was seen by      Social Work for additional counseling.  3. Hypertension.  Initially, the blood pressure was adequately controlled,     therefore we held all of his oral antihypertensive medications as     her was not alert and oriented to swallow pills and was at     high risk for aspiration.  Hydralazine IV p.r.n. was ordered for     elevated blood pressures.  4. PTSD.  We held his sertraline during the hospital course because of his     initial altered mental status.  5. Thrombocytopenia, likely secondary to his chronic alcohol abuse.     Platelets were stable between 100-140, therefore no transfusion was      needed, given that he was not actively bleeding.  We continued      to monitor his CBC throughout hospital course.  6. Leukocytosis, likely secondary to stress demargination from his seizure     episodes.  Other  infectious diseases were ruled out with a negative     chest x-ray, urinalysis and culture, and blood cultures x 2 preliminary.       No other sources of infection could be identified.  Also, he remained      afebrile throughout hospital course.  7. DVT prophylaxis.  Mr. Alsip received heparin 5000 units subcu t.i.d.  DISCHARGE VITAL SIGNS:  stable  DISCHARGE LABORATORY DATA:  Urine culture on August 25, 2010, shows 4000 colonies, insignificant growth.  Blood culture on August 25, 2010, x 2 preliminary report shows no growth to date.  Magnesium 1.8, sodium 134,  potassium 3.3, chloride 100, bicarb 22, BUN less than 3, creatinine 0.47, glucose 79, and calcium 7.7.  Lactic acid 1.6.  WBC 13.7, hemoglobin 13, hematocrit 34.7, platelets 107.  The discharge summary was reviewed and signed for Dr. Rogelia Boga.   ______________________________ Carrolyn Meiers, MD   ______________________________ Blanch Media, [name]   MH/MEDQ  D:  08/28/2010  T:  08/29/2010  Job:  [id number]  Electronically Signed by Carrolyn Meiers MD on 10/02/2010 12:17:55 PM Electronically Signed by Doneen Poisson  on 10/09/2010 09:11:30 AM

## 2010-11-22 ENCOUNTER — Inpatient Hospital Stay (HOSPITAL_COMMUNITY)
Admission: EM | Admit: 2010-11-22 | Discharge: 2010-11-25 | DRG: 897 | Disposition: A | Discharge status: Home / Self Care | Payer: Medicare Other | Attending: Internal Medicine | Admitting: Internal Medicine

## 2010-11-22 ENCOUNTER — Encounter: Payer: Self-pay | Admitting: Internal Medicine

## 2010-11-22 ENCOUNTER — Emergency Department (HOSPITAL_COMMUNITY): Payer: Medicare Other

## 2010-11-22 DIAGNOSIS — F1210 Cannabis abuse, uncomplicated: Secondary | ICD-10-CM | POA: Diagnosis present

## 2010-11-22 DIAGNOSIS — R55 Syncope and collapse: Secondary | ICD-10-CM | POA: Diagnosis present

## 2010-11-22 DIAGNOSIS — S022XXA Fracture of nasal bones, initial encounter for closed fracture: Secondary | ICD-10-CM | POA: Diagnosis present

## 2010-11-22 DIAGNOSIS — K219 Gastro-esophageal reflux disease without esophagitis: Secondary | ICD-10-CM | POA: Diagnosis present

## 2010-11-22 DIAGNOSIS — F3289 Other specified depressive episodes: Secondary | ICD-10-CM | POA: Diagnosis present

## 2010-11-22 DIAGNOSIS — F329 Major depressive disorder, single episode, unspecified: Secondary | ICD-10-CM | POA: Diagnosis present

## 2010-11-22 DIAGNOSIS — L02619 Cutaneous abscess of unspecified foot: Secondary | ICD-10-CM | POA: Diagnosis present

## 2010-11-22 DIAGNOSIS — K5900 Constipation, unspecified: Secondary | ICD-10-CM | POA: Diagnosis present

## 2010-11-22 DIAGNOSIS — W19XXXA Unspecified fall, initial encounter: Secondary | ICD-10-CM | POA: Diagnosis present

## 2010-11-22 DIAGNOSIS — F4310 Post-traumatic stress disorder, unspecified: Secondary | ICD-10-CM | POA: Diagnosis present

## 2010-11-22 DIAGNOSIS — F10239 Alcohol dependence with withdrawal, unspecified: Principal | ICD-10-CM | POA: Diagnosis present

## 2010-11-22 DIAGNOSIS — Z79899 Other long term (current) drug therapy: Secondary | ICD-10-CM

## 2010-11-22 DIAGNOSIS — R569 Unspecified convulsions: Secondary | ICD-10-CM | POA: Diagnosis present

## 2010-11-22 DIAGNOSIS — F17200 Nicotine dependence, unspecified, uncomplicated: Secondary | ICD-10-CM | POA: Diagnosis present

## 2010-11-22 DIAGNOSIS — F1020 Alcohol dependence, uncomplicated: Secondary | ICD-10-CM | POA: Diagnosis present

## 2010-11-22 DIAGNOSIS — I658 Occlusion and stenosis of other precerebral arteries: Secondary | ICD-10-CM | POA: Diagnosis present

## 2010-11-22 DIAGNOSIS — F10939 Alcohol use, unspecified with withdrawal, unspecified: Principal | ICD-10-CM | POA: Diagnosis present

## 2010-11-22 DIAGNOSIS — Y92009 Unspecified place in unspecified non-institutional (private) residence as the place of occurrence of the external cause: Secondary | ICD-10-CM

## 2010-11-22 DIAGNOSIS — D539 Nutritional anemia, unspecified: Secondary | ICD-10-CM | POA: Diagnosis present

## 2010-11-22 DIAGNOSIS — L97509 Non-pressure chronic ulcer of other part of unspecified foot with unspecified severity: Secondary | ICD-10-CM | POA: Diagnosis present

## 2010-11-22 DIAGNOSIS — E538 Deficiency of other specified B group vitamins: Secondary | ICD-10-CM | POA: Diagnosis present

## 2010-11-22 DIAGNOSIS — I10 Essential (primary) hypertension: Secondary | ICD-10-CM | POA: Diagnosis present

## 2010-11-22 DIAGNOSIS — E876 Hypokalemia: Secondary | ICD-10-CM | POA: Diagnosis not present

## 2010-11-22 DIAGNOSIS — I6529 Occlusion and stenosis of unspecified carotid artery: Secondary | ICD-10-CM | POA: Diagnosis present

## 2010-11-22 LAB — BASIC METABOLIC PANEL
BUN: 8 mg/dL (ref 6–23)
Calcium: 8.3 mg/dL — ABNORMAL LOW (ref 8.4–10.5)
Creatinine, Ser: 0.6 mg/dL (ref 0.50–1.35)
GFR calc Af Amer: >60 mL/min (ref >60)
GFR calc non Af Amer: >60 mL/min (ref >60)

## 2010-11-22 LAB — HEPATIC FUNCTION PANEL
ALT: 10 U/L (ref 0–53)
AST: 26 U/L (ref 0–37)
Albumin: 3.4 g/dL — ABNORMAL LOW (ref 3.5–5.2)
Indirect Bilirubin: 0.5 mg/dL (ref 0.3–0.9)
Total Protein: 7 g/dL (ref 6.0–8.3)

## 2010-11-22 LAB — CBC
HCT: 30.8 % — ABNORMAL LOW (ref 39.0–52.0)
MCH: 36.8 pg — ABNORMAL HIGH (ref 26.0–34.0)
MCHC: 36.7 g/dL — ABNORMAL HIGH (ref 30.0–36.0)
MCV: 100.3 fL — ABNORMAL HIGH (ref 78.0–100.0)
Platelets: 147 10*3/uL — ABNORMAL LOW (ref 150–400)
RDW: 16.7 % — ABNORMAL HIGH (ref 11.5–15.5)

## 2010-11-22 LAB — RPR: RPR Ser Ql: NONREACTIVE

## 2010-11-22 LAB — RAPID HIV SCREEN (WH-MAU): Rapid HIV Screen: NONREACTIVE

## 2010-11-22 LAB — VITAMIN B12: Vitamin B-12: 361 pg/mL (ref 211–911)

## 2010-11-22 LAB — CARDIAC PANEL(CRET KIN+CKTOT+MB+TROPI)
CK, MB: 2.8 ng/mL (ref 0.3–4.0)
Troponin I: <0.3 ng/mL (ref <0.30)

## 2010-11-22 LAB — DIFFERENTIAL
Eosinophils Absolute: 0.6 10*3/uL (ref 0.0–0.7)
Eosinophils Relative: 6 % — ABNORMAL HIGH (ref 0–5)
Lymphocytes Relative: 32 % (ref 12–46)
Lymphs Abs: 3.3 10*3/uL (ref 0.7–4.0)
Monocytes Absolute: 0.8 10*3/uL (ref 0.1–1.0)

## 2010-11-22 LAB — T4, FREE: Free T4: 0.77 ng/dL — ABNORMAL LOW (ref 0.80–1.80)

## 2010-11-22 LAB — FERRITIN: Ferritin: 620 ng/mL — ABNORMAL HIGH (ref 22–322)

## 2010-11-22 LAB — CK TOTAL AND CKMB (NOT AT ARMC): CK, MB: 2.6 ng/mL (ref 0.3–4.0)

## 2010-11-22 LAB — ETHANOL: Alcohol, Ethyl (B): <11 mg/dL (ref 0–11)

## 2010-11-22 NOTE — Consult Note (Signed)
Miguel Hanson, SCHWABE NO.:  [id number]  MEDICAL RECORD NO.:  [id number]  LOCATION:  [facility]                         FACILITY:  [facility]  PHYSICIAN:  [name] Settle, MD   DATE OF BIRTH:  Aug 19, 1945  DATE OF CONSULTATION:  11/22/2010 DATE OF DISCHARGE:                                CONSULTATION   REQUESTING PHYSICIAN:  [facility]  REASON FOR CONSULTATION:  Seizure.  HISTORY OF PRESENT ILLNESS:  The patient is a 65 year old African American male with a history of chronic alcoholism who apparently had a syncopal episode at home.  He said that he had a bowel movement and he was very constipated and when he stood up, he passed out.  His wife found him in the bathroom floor and called the ambulance.  Upon arrival, the ambulance found his blood glucose to be 130 and he had normal vital signs.  He was brought over to the hospital, at which point he underwent a CT scan of the brain.  In the CT scan, the patient had a generalized tonic-clonic seizure lasting 2 minutes and was given Ativan.  Subsequent CT scan was reviewed by me personally and indicates no acute hemorrhages.  The ventricles are normal and there are no abnormal hypodensities present.  CT scan of the cervical spine shows no cervical vertebral fractures.  The patient was then somewhat postictal and he is now returned back to baseline.  He has had 2 or 3 other seizures in the past, all of which were alcohol withdrawal related.  He tells me that his last alcoholic drink was 2 days ago, that was significantly less than what he had been drinking 2 or 3 days prior to that.  He denies any illicit drug use, but no urine drug screen has been performed.  PAST MEDICAL HISTORY: 1. Chronic alcoholism. 2. Hypertension. 3. Gastroesophageal reflux disease. 4. Depression.  PAST SURGICAL HISTORY:  Negative.  CURRENT MEDICATIONS: 1. Lisinopril. 2. Magnesium oxide. 3. Omeprazole. 4.  Zoloft. 5. Trazodone.  ALLERGIES:  No known drug allergies.  SOCIAL HISTORY:  He denies smoking.  He is a chronic alcoholic and denies any illicit drug use other than marijuana.  FAMILY HISTORY:  Noncontributory.  REVIEW OF SYSTEMS:  No fever, no meningismus, no diplopia.  No dysphagia.  No chest pain or shortness of breath. No diarrhea or constipation.  Other review of systems are negative.  PHYSICAL EXAMINATION:  VITAL SIGNS:  Blood pressure is 156/90, pulse of 100, temperature 97.8, and pulse oximetry 97% on room air. GENERAL:  He is awake and alert, fully oriented.  Language is fluent. Comprehension, naming and repetition are intact. HEENT:  Pupils are equal and reactive.  Extraocular movements are intact.  Face is symmetrical.  Tongue is midline. EXTREMITIES:  Strength is 5/5 bilaterally in upper and lower extremities and all muscle groups.  Reflexes are +1 and symmetrical.  Sensation is intact to all modalities.  Coordination is fully intact.  There is no Babinski sign.  There is no Hoffman sign. SKIN:  There are no skin rashes other than the bruises that he had from the falls and the ulceration in his foot.  LABORATORY DATA:  CBC showed WBC count 10.2, hemoglobin of 11.3, an MCV of 100, platelets are 147.  Basic metabolic panel shows a sodium 409, potassium 3.8, chloride 106, bicarb 24, BUN 8, creatinine 0.6, glucose 135, calcium is 8.3.  EKG is normal sinus rhythm.  IMPRESSION/PLAN: 1. Vasovagal syncope due to Valsalva  maneuver from bowel movement-     related constipation. 2. Probable alcohol withdrawal-related seizure.  The timing fits and     the decreasing alcohol intake fits very well with this.  I do not     suspect underlying epilepsy, however, if concern continued for     this, then an outpatient routine EEG can be performed to rule out     any seizure tendency.  He denies any illicit drug use, but I do     recommend doing a urine drug screen to rule out  cocaine or     amphetamines as underlying contributing factor.  He has already     received Ativan and no further antiepileptic drug treatment is     needed. 3. The patient has macrocytic anemia and consideration can be given to     checking his vitamin B12 and folic acid level and supplement that     can be added as needed.          ______________________________ [name] Settle, MD     SE/MEDQ  D:  11/22/2010  T:  11/22/2010  Job:  [id number]  Electronically Signed by [name] Settle MD on 11/22/2010 04:26:43 PM
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
## 2010-11-23 ENCOUNTER — Inpatient Hospital Stay (HOSPITAL_COMMUNITY): Payer: Medicare Other

## 2010-11-23 DIAGNOSIS — R55 Syncope and collapse: Secondary | ICD-10-CM

## 2010-11-23 DIAGNOSIS — W19XXXA Unspecified fall, initial encounter: Secondary | ICD-10-CM

## 2010-11-23 LAB — URINALYSIS, MICROSCOPIC ONLY
Glucose, UA: NEGATIVE mg/dL
Ketones, ur: 15 mg/dL — AB
Leukocytes, UA: NEGATIVE
Nitrite: NEGATIVE
Specific Gravity, Urine: 1.018 (ref 1.005–1.030)
pH: 6 (ref 5.0–8.0)

## 2010-11-23 LAB — CBC
HCT: 27.4 % — ABNORMAL LOW (ref 39.0–52.0)
Hemoglobin: 9.9 g/dL — ABNORMAL LOW (ref 13.0–17.0)
MCV: 101.5 fL — ABNORMAL HIGH (ref 78.0–100.0)
RBC: 2.7 MIL/uL — ABNORMAL LOW (ref 4.22–5.81)
RDW: 16.5 % — ABNORMAL HIGH (ref 11.5–15.5)
WBC: 8.2 10*3/uL (ref 4.0–10.5)

## 2010-11-23 LAB — BASIC METABOLIC PANEL
BUN: 4 mg/dL — ABNORMAL LOW (ref 6–23)
BUN: 4 mg/dL — ABNORMAL LOW (ref 6–23)
CO2: 24 mEq/L (ref 19–32)
CO2: 28 mEq/L (ref 19–32)
Calcium: 7.8 mg/dL — ABNORMAL LOW (ref 8.4–10.5)
Chloride: 102 mEq/L (ref 96–112)
Creatinine, Ser: <0.47 mg/dL — ABNORMAL LOW (ref 0.50–1.35)
Glucose, Bld: 82 mg/dL (ref 70–99)
Glucose, Bld: 97 mg/dL (ref 70–99)
Potassium: 2.8 mEq/L — ABNORMAL LOW (ref 3.5–5.1)
Sodium: 132 mEq/L — ABNORMAL LOW (ref 135–145)

## 2010-11-23 LAB — RAPID URINE DRUG SCREEN, HOSP PERFORMED
Amphetamines: NOT DETECTED
Benzodiazepines: NOT DETECTED
Cocaine: NOT DETECTED

## 2010-11-23 LAB — CARDIAC PANEL(CRET KIN+CKTOT+MB+TROPI)
Relative Index: 1.7 (ref 0.0–2.5)
Total CK: 150 U/L (ref 7–232)

## 2010-11-24 DIAGNOSIS — R55 Syncope and collapse: Secondary | ICD-10-CM

## 2010-11-25 ENCOUNTER — Ambulatory Visit (HOSPITAL_COMMUNITY): Admission: RE | Admit: 2010-11-25 | Payer: Medicare Other | Source: Ambulatory Visit

## 2010-11-25 LAB — CULTURE, BLOOD (ROUTINE X 2): Culture  Setup Time: 201209221850

## 2010-11-25 LAB — BASIC METABOLIC PANEL
BUN: 4 mg/dL — ABNORMAL LOW (ref 6–23)
CO2: 25 mEq/L (ref 19–32)
Calcium: 8.5 mg/dL (ref 8.4–10.5)
Glucose, Bld: 77 mg/dL (ref 70–99)
Potassium: 4.5 mEq/L (ref 3.5–5.1)
Sodium: 133 mEq/L — ABNORMAL LOW (ref 135–145)

## 2010-11-28 LAB — CULTURE, BLOOD (ROUTINE X 2): Culture  Setup Time: 201209221850

## 2010-12-10 NOTE — Discharge Summary (Signed)
Miguel Hanson, Miguel Hanson NO.:  [id number]  MEDICAL RECORD NO.:  [id number]  LOCATION:  3737                         FACILITY:  [facility]  PHYSICIAN:  Doneen Poisson, MD     DATE OF BIRTH:  02-Apr-1945  DATE OF ADMISSION:  11/22/2010 DATE OF DISCHARGE:  11/25/2010                              DISCHARGE SUMMARY   DISCHARGE DIAGNOSES: 1. Syncope likely vasovagal secondary to micturition. 2. Alcohol withdrawal with seizures. 3. Right foot cellulitis secondary to chronic tinea pedis. 4. Nasal fracture. 5. Contusion and laceration to the forehead. 6. Macrocytic anemia secondary to chronic alcoholism. 7. Chronic alcoholism. 8. Post-traumatic stress disorder. 9. Hypertension. 10.Congestive heart failure, stable, grade 1 diastolic dysfunction.  DISCHARGE MEDICATIONS: 1. Fluconazole 100 mg p.o. daily x 7 days. 2. Folic acid 1 mg p.o. daily. 3. Librium 25 mg p.o. daily . 4. Mupirocin 2% ointment applied to foot wounds including between toes     until scab forms twice daily.  Keep protective and clean bandage. 5. Thiamine 100 mg p.o. daily. 6. Doxycycline 100 mg p.o. b.i.d. x7 days. 7. Miconazole powder 2%, instructions; once finished with fluconazole     pills apply powder twice a day to see including in between toes for     4 weeks, use cotton balls, keep toe separated. 8. Lisinopril 10 mg p.o. daily. 9. Magnesium oxide 800 mg p.o. daily. 10.Multivitamin one p.o. daily. 11.Omeprazole 40 mg p.o. daily. 12.Sertraline 200 mg p.o. daily.  DISPOSITION AND FOLLOWUP: 1. Miguel Hanson was discharged in stable and improved condition with     follow up to be scheduled with his primary care physician, Miguel Hanson at the [facility] within     5-7 days for removal of stitches and evaluation for EEG, telephone     number [phone].  2. ENT evaluation per the [facility] in [place], [place] for evaluation of his  nasal fracture.  3. Keep appointment that has already been scheduled for the dentist to      extract his teeth.  He reports having scheduled this appointment prior      to admission to the hospital.  PROCEDURES:  CT of the head without contrast and CT of the cervical spine without contrast; impression; negative for bleed or other acute intracranial process, atrial and nonspecific white matter changes. Bilateral nasal bone fractures displaced on the right.  CT of cervical spine; impression; negative for fracture or other acute bony abnormality.  Bilateral carotid bifurcation plaque.  Both of these imagings were conducted on November 22, 2010.  Also, on November 22, 2010, x-ray of the right foot with no evidence of right foot fracture dislocation with normal calcaneus and no joint effusion.  November 24, 2010, carotid duplex study demonstrated no significant extracranial carotid artery stenosis.  Vertebral patent with antegrade flow.  CONSULTATIONS: 1. 11/22/10 Neurology.  Impression:  Miguel Hanson likely suffered vasovagal     syncope with an acute alcohol withdrawal seizure. 2. 11/22/10 Otolaryngology (ENT)reviewed the  CT scan demonstrating nasal      fracture and recommended follow up as an outpatient once the swelling  has subsided over his nasal fracture.  BRIEF ADMITTING HISTORY AND PHYSICAL:  Miguel Hanson is a 65 year old man admitted with syncope after waking up in the middle of night and urinating.  He suffered a tonic-clonic seizure in the CT scanner of the emergency department.  His history is significant for multiple admissions for alcohol withdrawal seizures with his last drink of alcohol the night prior to admission.  He denied presyncopal symptoms,  chest pain, shortness of breath, weakness or bowel or urine incontinence.  PHYSICAL EXAMINATION: VITAL SIGNS:  Temperature 97.8, blood pressure 152/90, pulse 99,  respirations 22, oxygen saturation 96% on room  air. GENERAL:  Elderly black man, cachectic, lying on stretcher.  He  appeared to be cold and uncomfortable in no acute distress. HEAD AND NECK:  Normocephalic, had a shaved head with contusion and swelling to the midline of his forehead with sutures intact. Pupils were equal and reactive to light and accommodation.  Extraocular muscles intact.  Positive arcus senilis, sclerae was slightly icteric bilaterally, mucous membranes moist, not inflammed, poor dentition with loss of most of his teeth that preceded this incident, prominent nasal bridge deformity which is nontender, nares with thromboses bilaterally right greater than left, no active bleeding appreciated, no blood noted in external acoustic meati bilaterally, tympanic membranes clear bilaterally, cervical collar in place, no carotid bruits appreciated, no JVD. LUNGS:  Normal respiratory effort.  Clear to auscultation bilaterally without crackles or wheezes. HEART:  Regular rate and rhythm.  S1-S2 normal without gallops, murmurs or rubs. ABDOMEN:  Hyperactive bowel sounds, soft, nondistended, nontender.  Liver  appreciated beyond costophrenic angle. EXTREMITIES:  No pretibial edema, pulses intact bilaterally, right lower foot with marked erythema, swelling, hyperesthesia, multiple small decubiti between digits, there was a small decubitus on the plantar aspect of his foot which was black, dry without appreciable weeping but as exquisitely tender, thus rendering a limited foot exam. NEUROLOGIC:  Alert, oriented, appropriate and cooperative throughout the  exam.  No facial droop or hemiparesis appreciated, motor strength 5/5  bilateral upper extremities, 5/5 left lower extremity, 4/5 right lower  extremity, sensation grossly intact bilateral lower extremities, negative  Babinski.  ADMISSION LABORATORIES:  White blood cell count 10.2, hemoglobin 11.3,  hematocrit 30.8, MCV 100.3, platelets 147.  54% neutrophils.  Sodium 142,   potassium 3.8, chloride 106, bicarb 24, BUN 8, creatinine 0.60, glucose 135,  calcium 8.3, eGFR > 60%.  Alcohol level < 11, magnesium 1.6, troponin < 0.3,  CK-MB 10.6.  HIV screen nonreactive.  Total bilirubin 0.7, direct bilirubin  0.2, indirect bilirubin 0.5, alkaline phosphatase 102, AST 26, ALT 10, total  protein 7.0, albumin 3.4, TSH 0.486.  T4 0.77.  Urine drug screen positive  for tetrahydrocannabinol. Urinalysis negative with exception of 15 ketones.  HOSPITAL COURSE BY PROBLEMS: 1. Alcohol withdrawal seizures/syncope.  Mr. Gusler was admitted to the Inpatient      Internal Medicine Teaching Service and placed on CIWA protocol      whereby he was without additional seizures, demonstrated decreased      agitation but remained tremulous thoroughout his stay.  Social     Work was consulted and counseled for alcohol abuse.  He was started on Librium,      and discharged with instructions to continue taking the Librium until      further evaluation by his PCP Miguel Hanson and/or Neurology      at the [facility] in [place], [place].  2. Facial trauma: Mr.  Robillard suffered facial trauma including laceration      to his forehead requiring sutures in addition to displaced nasal fracture.      The CT scans were reviewed by ENT and deemed to be manageable as an      outpatient once the edema subsided. He is to follow-up with his PCP for      referral to ENT within the [facility].   3. Mr. Davie had a right foot ulceration and maceration between the     toes worrisome for osteomyelitis.  Unfortunately, he was     tremulous in spite of pre-sedation with morphine and Ativan, thus a     diagnostic MRI could not be performed.  Within 24 hours, the foot     wound started to look more fungal in nature.  He also     verified that he has suffered from chronic "athlete's foot" with     prior episodes of open foot wounds and maceration to the opposite     foot which  subsequently resolved.  Wound Care was consulted and     he was started on oral fluconazole, bacitracin ointment application      and cotton balls between his toes to allow for better air exposure      which would facilitate healing.  By discharge, he was alert, less tremulous,     hemodynamically stable with resolution of edema to his forehead and     nasal bridge and general improvement in his right foot.  DISCHARGE VITALS AND LABORATORIES:  Temperature 98.5, pulse 74, respirations 19, blood pressure 154/81, oxygen saturation 100%.  Blood cultures were negative for growth at 5 days.  Sodium 133, potassium 4.5, chloride 101, bicarb 25, BUN 4, creatinine < 0.47, glucose 77, calcium 8.5, magnesium 2.1.  White blood cell count 8.2, hemoglobin 9.9, hematocrit 27.4, MCV 101.5, platelets 121.   ______________________________ Kristie Cowman, MD   ______________________________ Doneen Poisson, MD   KS/MEDQ  D:  12/03/2010  T:  12/03/2010  Job:  [id number]  cc:   Miguel Hanson  Electronically Signed by Kristie Cowman MD on 12/04/2010 03:05:48 PM Electronically Signed by Doneen Poisson  on 12/10/2010 11:41:12 AM

## 2011-03-07 ENCOUNTER — Encounter (HOSPITAL_COMMUNITY): Payer: Self-pay | Admitting: Emergency Medicine

## 2011-03-07 ENCOUNTER — Inpatient Hospital Stay (HOSPITAL_COMMUNITY)
Admission: EM | Admit: 2011-03-07 | Discharge: 2011-03-10 | DRG: 897 | Disposition: A | Discharge status: Home / Self Care | Payer: Medicare Other | Attending: Internal Medicine | Admitting: Internal Medicine

## 2011-03-07 ENCOUNTER — Emergency Department (HOSPITAL_COMMUNITY): Payer: Medicare Other

## 2011-03-07 DIAGNOSIS — F10939 Alcohol use, unspecified with withdrawal, unspecified: Principal | ICD-10-CM | POA: Diagnosis present

## 2011-03-07 DIAGNOSIS — F4310 Post-traumatic stress disorder, unspecified: Secondary | ICD-10-CM | POA: Diagnosis present

## 2011-03-07 DIAGNOSIS — J4489 Other specified chronic obstructive pulmonary disease: Secondary | ICD-10-CM | POA: Diagnosis present

## 2011-03-07 DIAGNOSIS — I10 Essential (primary) hypertension: Secondary | ICD-10-CM | POA: Diagnosis present

## 2011-03-07 DIAGNOSIS — R569 Unspecified convulsions: Secondary | ICD-10-CM | POA: Diagnosis present

## 2011-03-07 DIAGNOSIS — F10239 Alcohol dependence with withdrawal, unspecified: Principal | ICD-10-CM

## 2011-03-07 DIAGNOSIS — F1010 Alcohol abuse, uncomplicated: Secondary | ICD-10-CM | POA: Diagnosis present

## 2011-03-07 DIAGNOSIS — F1020 Alcohol dependence, uncomplicated: Secondary | ICD-10-CM | POA: Diagnosis present

## 2011-03-07 DIAGNOSIS — F17200 Nicotine dependence, unspecified, uncomplicated: Secondary | ICD-10-CM | POA: Diagnosis present

## 2011-03-07 DIAGNOSIS — G40909 Epilepsy, unspecified, not intractable, without status epilepticus: Secondary | ICD-10-CM | POA: Diagnosis present

## 2011-03-07 DIAGNOSIS — J449 Chronic obstructive pulmonary disease, unspecified: Secondary | ICD-10-CM | POA: Diagnosis present

## 2011-03-07 DIAGNOSIS — D72829 Elevated white blood cell count, unspecified: Secondary | ICD-10-CM | POA: Diagnosis present

## 2011-03-07 DIAGNOSIS — J329 Chronic sinusitis, unspecified: Secondary | ICD-10-CM | POA: Diagnosis present

## 2011-03-07 HISTORY — DX: Unspecified convulsions: R56.9

## 2011-03-07 HISTORY — DX: Alcohol abuse, uncomplicated: F10.10

## 2011-03-07 HISTORY — DX: Other psychoactive substance abuse, uncomplicated: F19.10

## 2011-03-07 NOTE — ED Notes (Signed)
WUJ:WJXB<JY> Expected date:03/07/11<BR> Expected time:10:18 PM<BR> Means of arrival:Ambulance<BR> Comments:<BR> EMS 261 GC, 65 yom seizure w hx

## 2011-03-07 NOTE — ED Provider Notes (Signed)
History     CSN: [id number]  Arrival date & time 03/07/11  2237   First MD Initiated Contact with Patient 03/07/11 2257      Chief Complaint  Patient presents with  . Seizures  . Alcohol Problem  . Drug Problem    (Consider location/radiation/quality/duration/timing/severity/associated sxs/prior treatment) Patient is a 66 y.o. male presenting with seizures, alcohol problem, and drug problem. The history is provided by the patient.  Seizures   Alcohol Problem  Drug Problem   patient presents after having an alcohol withdrawal seizure. Patient's last drink was 24 hours ago, has history of same. Patient only drinks a bottle of wine or more a day. No loss of bladder function. No tongue biting. Denies any headache at this time. No abdominal pain vomiting fever or neck pain recently. Denies any photophobia. Nothing makes the symptoms better or worse. Patient called EMS and blood sugar was above 90 and patient was transported here  Past Medical History  Diagnosis Date  . Hypertension   . Seizures   . ETOH abuse   . Drug abuse     History reviewed. No pertinent past surgical history.  History reviewed. No pertinent family history.  History  Substance Use Topics  . Smoking status: Not on file  . Smokeless tobacco: Not on file  . Alcohol Use: Yes     abuse/binge      Review of Systems  Unable to perform ROS Neurological: Positive for seizures.  All other systems reviewed and are negative.    Allergies  Review of patient's allergies indicates no known allergies.  Home Medications  No current outpatient prescriptions on file.  BP 163/103  Pulse 106  Temp(Src) 98.6 F (37 C) (Oral)  Resp 18  SpO2 99%  Physical Exam  Nursing note and vitals reviewed. Constitutional: He is oriented to person, place, and time. He appears well-developed and well-nourished.  Non-toxic appearance. No distress.  HENT:  Head: Normocephalic and atraumatic.  Eyes: Conjunctivae, EOM  and lids are normal. Pupils are equal, round, and reactive to light.  Neck: Normal range of motion. Neck supple. No tracheal deviation present. No mass present.  Cardiovascular: Regular rhythm and normal heart sounds.  Tachycardia present.  Exam reveals no gallop.   No murmur heard. Pulmonary/Chest: Effort normal and breath sounds normal. No stridor. No respiratory distress. He has no decreased breath sounds. He has no wheezes. He has no rhonchi. He has no rales.  Abdominal: Soft. Normal appearance and bowel sounds are normal. He exhibits no distension. There is no tenderness. There is no rebound and no CVA tenderness.  Musculoskeletal: Normal range of motion. He exhibits no edema and no tenderness.  Neurological: He is alert and oriented to person, place, and time. He has normal strength. No cranial nerve deficit or sensory deficit. GCS eye subscore is 4. GCS verbal subscore is 5. GCS motor subscore is 6.  Skin: Skin is warm and dry. No abrasion and no rash noted.  Psychiatric: He has a normal mood and affect. His speech is normal and behavior is normal.    ED Course  Procedures (including critical care time)   Labs Reviewed  ETHANOL  CBC  COMPREHENSIVE METABOLIC PANEL  URINE RAPID DRUG SCREEN (HOSP PERFORMED)   No results found.   No diagnosis found.    MDM  Patient given IV fluids and Ativan here. Patient to be admitted for treatment of his alcohol withdrawal seizures `        Ethelene Browns T  Freida Busman, MD 03/08/11 0454

## 2011-03-07 NOTE — ED Notes (Signed)
MD at bedside. 

## 2011-03-07 NOTE — ED Notes (Signed)
Per EMS: Pt with known drug and etoh problems per wife, was drinking heavily since New Years and is non compliant with seizure medications. Pt had witnessed seizure for approx 2 minutes per wife, was cool and diaphoretic, beligerant on scene. PIV #20 left a/c.

## 2011-03-07 NOTE — ED Notes (Signed)
Pt phone number: [phone]

## 2011-03-08 ENCOUNTER — Emergency Department (HOSPITAL_COMMUNITY): Payer: Medicare Other

## 2011-03-08 ENCOUNTER — Encounter (HOSPITAL_COMMUNITY): Payer: Self-pay | Admitting: Family Medicine

## 2011-03-08 DIAGNOSIS — F1010 Alcohol abuse, uncomplicated: Secondary | ICD-10-CM | POA: Diagnosis present

## 2011-03-08 DIAGNOSIS — G40909 Epilepsy, unspecified, not intractable, without status epilepticus: Secondary | ICD-10-CM | POA: Diagnosis present

## 2011-03-08 DIAGNOSIS — F4310 Post-traumatic stress disorder, unspecified: Secondary | ICD-10-CM

## 2011-03-08 DIAGNOSIS — I10 Essential (primary) hypertension: Secondary | ICD-10-CM | POA: Diagnosis present

## 2011-03-08 LAB — COMPREHENSIVE METABOLIC PANEL
ALT: 16 U/L (ref 0–53)
BUN: 7 mg/dL (ref 6–23)
CO2: 22 mEq/L (ref 19–32)
CO2: 22 mEq/L (ref 19–32)
Calcium: 8.8 mg/dL (ref 8.4–10.5)
Calcium: 8.9 mg/dL (ref 8.4–10.5)
Chloride: 94 mEq/L — ABNORMAL LOW (ref 96–112)
Creatinine, Ser: 0.73 mg/dL (ref 0.50–1.35)
Creatinine, Ser: 0.71 mg/dL (ref 0.50–1.35)
GFR calc Af Amer: >90 mL/min (ref >90)
GFR calc Af Amer: >90 mL/min (ref >90)
GFR calc non Af Amer: >90 mL/min (ref >90)
GFR calc non Af Amer: >90 mL/min (ref >90)
Glucose, Bld: 87 mg/dL (ref 70–99)
Sodium: 134 mEq/L — ABNORMAL LOW (ref 135–145)
Total Bilirubin: 1 mg/dL (ref 0.3–1.2)

## 2011-03-08 LAB — CBC
HCT: 37.2 % — ABNORMAL LOW (ref 39.0–52.0)
HCT: 37.8 % — ABNORMAL LOW (ref 39.0–52.0)
Hemoglobin: 13.8 g/dL (ref 13.0–17.0)
MCH: 33.9 pg (ref 26.0–34.0)
MCH: 33.3 pg (ref 26.0–34.0)
MCV: 90.7 fL (ref 78.0–100.0)
MCV: 91.1 fL (ref 78.0–100.0)
RBC: 4.1 MIL/uL — ABNORMAL LOW (ref 4.22–5.81)
RBC: 4.15 MIL/uL — ABNORMAL LOW (ref 4.22–5.81)
WBC: 13.6 10*3/uL — ABNORMAL HIGH (ref 4.0–10.5)

## 2011-03-08 LAB — PHOSPHORUS: Phosphorus: 1.8 mg/dL — ABNORMAL LOW (ref 2.3–4.6)

## 2011-03-08 LAB — RAPID URINE DRUG SCREEN, HOSP PERFORMED
Amphetamines: NOT DETECTED
Barbiturates: NOT DETECTED
Opiates: NOT DETECTED
Tetrahydrocannabinol: POSITIVE — AB

## 2011-03-08 MED ORDER — MAGNESIUM OXIDE 400 MG PO TABS
400.0000 mg | ORAL_TABLET | Freq: Two times a day (BID) | ORAL | Status: DC
  Administered 2011-03-08 – 2011-03-10 (×4): 400 mg via ORAL
  Filled 2011-03-08 (×8): qty 1

## 2011-03-08 MED ORDER — THIAMINE HCL 100 MG/ML IJ SOLN
100.0000 mg | Freq: Every day | INTRAMUSCULAR | Status: DC
  Administered 2011-03-09: 100 mg via INTRAVENOUS
  Filled 2011-03-08 (×4): qty 2

## 2011-03-08 MED ORDER — FOLIC ACID 1 MG PO TABS
1.0000 mg | ORAL_TABLET | Freq: Every day | ORAL | Status: DC
  Administered 2011-03-08 – 2011-03-10 (×2): 1 mg via ORAL
  Filled 2011-03-08 (×4): qty 1

## 2011-03-08 MED ORDER — AMLODIPINE BESYLATE 5 MG PO TABS
5.0000 mg | ORAL_TABLET | Freq: Every day | ORAL | Status: DC
  Administered 2011-03-08 – 2011-03-10 (×2): 5 mg via ORAL
  Filled 2011-03-08 (×4): qty 1

## 2011-03-08 MED ORDER — ADULT MULTIVITAMIN W/MINERALS CH
1.0000 | ORAL_TABLET | Freq: Every day | ORAL | Status: DC
  Administered 2011-03-08 – 2011-03-10 (×2): 1 via ORAL
  Filled 2011-03-08 (×5): qty 1

## 2011-03-08 MED ORDER — LORAZEPAM 1 MG PO TABS
0.0000 mg | ORAL_TABLET | Freq: Four times a day (QID) | ORAL | Status: AC
  Administered 2011-03-08: 1 mg via ORAL
  Administered 2011-03-09 (×2): 2 mg via ORAL
  Administered 2011-03-09: 1 mg via ORAL
  Administered 2011-03-10: 2 mg via ORAL
  Filled 2011-03-08 (×2): qty 2
  Filled 2011-03-08: qty 1
  Filled 2011-03-08: qty 2
  Filled 2011-03-08: qty 1

## 2011-03-08 MED ORDER — LORAZEPAM 2 MG/ML IJ SOLN
1.0000 mg | Freq: Four times a day (QID) | INTRAMUSCULAR | Status: DC | PRN
  Administered 2011-03-09: 1 mg via INTRAVENOUS
  Filled 2011-03-08: qty 1

## 2011-03-08 MED ORDER — LORAZEPAM 2 MG/ML IJ SOLN
1.0000 mg | Freq: Once | INTRAMUSCULAR | Status: AC
  Administered 2011-03-08: 1 mg via INTRAVENOUS
  Filled 2011-03-08: qty 1

## 2011-03-08 MED ORDER — MOXIFLOXACIN HCL IN NACL 400 MG/250ML IV SOLN
400.0000 mg | INTRAVENOUS | Status: DC
  Administered 2011-03-08 – 2011-03-10 (×3): 400 mg via INTRAVENOUS
  Filled 2011-03-08 (×3): qty 250

## 2011-03-08 MED ORDER — POTASSIUM CHLORIDE 20 MEQ/15ML (10%) PO LIQD
40.0000 meq | Freq: Once | ORAL | Status: AC
  Administered 2011-03-08: 40 meq via ORAL
  Filled 2011-03-08: qty 30

## 2011-03-08 MED ORDER — ONDANSETRON HCL 4 MG PO TABS: 4.0000 mg | ORAL_TABLET | Freq: Four times a day (QID) | ORAL | Status: DC | PRN

## 2011-03-08 MED ORDER — LISINOPRIL 10 MG PO TABS
10.0000 mg | ORAL_TABLET | Freq: Every day | ORAL | Status: DC
  Administered 2011-03-08 – 2011-03-10 (×2): 10 mg via ORAL
  Filled 2011-03-08 (×4): qty 1

## 2011-03-08 MED ORDER — ENOXAPARIN SODIUM 40 MG/0.4ML ~~LOC~~ SOLN
40.0000 mg | SUBCUTANEOUS | Status: DC
  Administered 2011-03-08 – 2011-03-10 (×3): 40 mg via SUBCUTANEOUS
  Filled 2011-03-08 (×4): qty 0.4

## 2011-03-08 MED ORDER — LORAZEPAM 1 MG PO TABS: 1.0000 mg | ORAL_TABLET | Freq: Four times a day (QID) | ORAL | Status: DC | PRN

## 2011-03-08 MED ORDER — POTASSIUM CHLORIDE IN NACL 20-0.9 MEQ/L-% IV SOLN
INTRAVENOUS | Status: DC
  Administered 2011-03-08: 05:00:00 via INTRAVENOUS
  Filled 2011-03-08 (×10): qty 1000

## 2011-03-08 MED ORDER — ONDANSETRON HCL 4 MG/2ML IJ SOLN: 4.0000 mg | Freq: Four times a day (QID) | INTRAMUSCULAR | Status: DC | PRN

## 2011-03-08 MED ORDER — VITAMIN B-1 100 MG PO TABS
100.0000 mg | ORAL_TABLET | Freq: Every day | ORAL | Status: DC
  Administered 2011-03-08 – 2011-03-10 (×2): 100 mg via ORAL
  Filled 2011-03-08 (×4): qty 1

## 2011-03-08 MED ORDER — PHENYTOIN 50 MG PO CHEW
100.0000 mg | CHEWABLE_TABLET | Freq: Three times a day (TID) | ORAL | Status: DC
  Administered 2011-03-08 – 2011-03-10 (×6): 100 mg via ORAL
  Filled 2011-03-08 (×13): qty 2

## 2011-03-08 MED ORDER — SERTRALINE HCL 100 MG PO TABS
100.0000 mg | ORAL_TABLET | Freq: Every day | ORAL | Status: DC
  Administered 2011-03-08 – 2011-03-10 (×2): 100 mg via ORAL
  Filled 2011-03-08 (×5): qty 1

## 2011-03-08 MED ORDER — LORAZEPAM 1 MG PO TABS
0.0000 mg | ORAL_TABLET | Freq: Two times a day (BID) | ORAL | Status: DC
  Administered 2011-03-10: 1 mg via ORAL
  Filled 2011-03-08: qty 1

## 2011-03-08 MED ORDER — GADOBENATE DIMEGLUMINE 529 MG/ML IV SOLN
15.0000 mL | Freq: Once | INTRAVENOUS | Status: AC | PRN
  Administered 2011-03-08: 15 mL via INTRAVENOUS

## 2011-03-08 MED ORDER — ALUM & MAG HYDROXIDE-SIMETH 200-200-20 MG/5ML PO SUSP: 30.0000 mL | Freq: Four times a day (QID) | ORAL | Status: DC | PRN

## 2011-03-08 MED ORDER — MAGNESIUM OXIDE 400 MG PO TABS
400.0000 mg | ORAL_TABLET | Freq: Two times a day (BID) | ORAL | Status: DC
  Administered 2011-03-08: 400 mg via ORAL
  Filled 2011-03-08 (×4): qty 1

## 2011-03-08 NOTE — ED Notes (Signed)
Attempt to call report to 5E, floor no answer

## 2011-03-08 NOTE — H&P (Signed)
[facility]   [facility]  Chief Complaint:  seizures  HPI: This is a 66 year old gentleman was worked up for seizures. The patient states that his wife called [phone] at the had seizures. He states he is a history of seizures, he does drink, he states he drinks anything, "you know these old veterans'. He states he smokes. Patient's a fair historian. History provided by patient, who doesn't appear particularly post ictal  Review of Systems: Positives bolded  The patient denies anorexia, fever, weight loss,, vision loss, decreased hearing, hoarseness, chest pain, syncope, dyspnea on exertion, peripheral edema, balance deficits, hemoptysis, abdominal pain, melena, hematochezia, severe indigestion/heartburn, hematuria, incontinence, genital sores, muscle weakness, suspicious skin lesions, transient blindness, difficulty walking, depression, unusual weight change, abnormal bleeding, enlarged lymph nodes, angioedema, and breast masses.  Past Medical History: Past Medical History  Diagnosis Date  . Hypertension   . Seizures   . ETOH abuse   . Drug abuse    History reviewed. No pertinent past surgical history.  Medications: Prior to Admission medications   Medication Sig Start Date End Date Taking? Authorizing Provider  lisinopril (PRINIVIL,ZESTRIL) 20 MG tablet Take 10 mg by mouth daily.     Yes Historical Provider, MD  magnesium oxide (MAG-OX) 400 MG tablet Take 400 mg by mouth 2 (two) times daily.     Yes Historical Provider, MD  sertraline (ZOLOFT) 100 MG tablet Take 100 mg by mouth daily.     Yes Historical Provider, MD    Allergies:  No Known Allergies  Social History:  reports that he has been smoking.  He does not have any smokeless tobacco history on file. He reports that he drinks alcohol. He reports that he uses illicit drugs (IV).  Family History: History reviewed. No pertinent family history.  Physical Exam: Filed Vitals:   03/07/11 2242 03/08/11 0030 03/08/11 0115 03/08/11 0130  BP:    162/106 164/102  Pulse:   109 105  Temp:      TempSrc:      Resp:  17  16  SpO2: 99%  98% 95%    General:  Alert and oriented times three, cachectic  no acute distress Eyes: PERRLA, pink conjunctiva, no scleral icterus ENT: Moist oral mucosa, neck supple, no thyromegaly Lungs: clear to ascultation, no wheeze, no crackles, no use of accessory muscles Cardiovascular: regular rate and rhythm, no regurgitation, no gallops, no murmurs. No carotid bruits, no JVD Abdomen: soft, positive BS, non-tender, non-distended, no organomegaly, not an acute abdomen GU: not examined Neuro: CN II - XII grossly intact, sensation intact Musculoskeletal: strength 5/5 all extremities, no clubbing, cyanosis or edema Skin: no rash, no subcutaneous crepitation, no decubitus    Labs on Admission:   [facility] 03/07/11 2307  NA 133*  K 3.5  CL 94*  CO2 22  GLUCOSE 107*  BUN 7  CREATININE 0.73  CALCIUM 8.8  MG --  PHOS --    Basename 03/07/11 2307  AST 59*  ALT 17  ALKPHOS 72  BILITOT 1.0  PROT 8.0  ALBUMIN 4.0   No results found for this basename: LIPASE:2,AMYLASE:2 in the last 72 hours  Basename 03/07/11 2307  WBC 13.6*  NEUTROABS --  HGB 13.9  HCT 37.2*  MCV 90.7  PLT 103*   No results found for this basename: CKTOTAL:3,CKMB:3,CKMBINDEX:3,TROPONINI:3 in the last 72 hours No components found with this basename: POCBNP:3 No results found for this basename: DDIMER:2 in the last 72 hours No results found for this basename: HGBA1C:2 in the  last 72 hours No results found for this basename: CHOL:2,HDL:2,LDLCALC:2,TRIG:2,CHOLHDL:2,LDLDIRECT:2 in the last 72 hours No results found for this basename: TSH,T4TOTAL,FREET3,T3FREE,THYROIDAB in the last 72 hours No results found for this basename: VITAMINB12:2,FOLATE:2,FERRITIN:2,TIBC:2,IRON:2,RETICCTPCT:2 in the last 72 hours  Micro Results: No results found for this or any previous visit (from the past 240 hour(s)).   Radiological Exams on  Admission: Ct Head Wo Contrast  03/08/2011  *RADIOLOGY REPORT*  Clinical Data: Seizure, pain.  CT HEAD WITHOUT CONTRAST,CT MAXILLOFACIAL WITHOUT CONTRAST  Technique:  Contiguous axial images were obtained from the base of the skull through the vertex without contrast.,Technique: Multidetector CT imaging of the maxillofacial structures was performed. Multiplanar CT image reconstructions were also generated.  Comparison: 11/22/2010  Findings:  Head: Prominence of the sulci, cisterns, and ventricles, in keeping with volume loss. There are subcortical and periventricular white matter hypodensities, a nonspecific finding most often seen with chronic microangiopathic changes.  There is no evidence for acute hemorrhage, overt hydrocephalus, mass lesion, or abnormal extra-axial fluid collection.  No definite CT evidence for acute cortical based (large artery) infarction. Focal hypodensity within the left thalamus may represent an age indeterminate lacunar infarction.  Right frontal scalp hematoma.  No underlying calvarial fracture.  Maxillofacial:  Globes are symmetric.  Lenses located.  No retrobulbar hematoma.  Chronic sclerosis and thickening of the left sphenoid chamber with associate mucosal thickening and mucous/debris.  Otherwise, the visualized paranasal sinuses and mastoid air cells are clear.  Bilateral nasal bone fractures with diastases on the right at the articulation with the nasal process of the maxilla.  The nasal septum deviates to the left.  No disruption.  Sinus walls and orbital walls are intact.  Poor dentition including periapical lucency of the medial incisor of the maxilla. No mandible fracture. Pterygoid plates, zygomatic arches, and upper cervical spine are intact.  IMPRESSION: Frontal scalp hematoma.  No underlying calvarial fracture.  Focal hypodensity within the left thalamus may represent an age indeterminate lacunar infarction.  Nasal bone fractures.  No additional maxillofacial bone  fractures identified.  Original Report Authenticated By: Waneta Martins, [name]   Ct Maxillofacial Wo Cm  03/08/2011  *RADIOLOGY REPORT*  Clinical Data: Seizure, pain.  CT HEAD WITHOUT CONTRAST,CT MAXILLOFACIAL WITHOUT CONTRAST  Technique:  Contiguous axial images were obtained from the base of the skull through the vertex without contrast.,Technique: Multidetector CT imaging of the maxillofacial structures was performed. Multiplanar CT image reconstructions were also generated.  Comparison: 11/22/2010  Findings:  Head: Prominence of the sulci, cisterns, and ventricles, in keeping with volume loss. There are subcortical and periventricular white matter hypodensities, a nonspecific finding most often seen with chronic microangiopathic changes.  There is no evidence for acute hemorrhage, overt hydrocephalus, mass lesion, or abnormal extra-axial fluid collection.  No definite CT evidence for acute cortical based (large artery) infarction. Focal hypodensity within the left thalamus may represent an age indeterminate lacunar infarction.  Right frontal scalp hematoma.  No underlying calvarial fracture.  Maxillofacial:  Globes are symmetric.  Lenses located.  No retrobulbar hematoma.  Chronic sclerosis and thickening of the left sphenoid chamber with associate mucosal thickening and mucous/debris.  Otherwise, the visualized paranasal sinuses and mastoid air cells are clear.  Bilateral nasal bone fractures with diastases on the right at the articulation with the nasal process of the maxilla.  The nasal septum deviates to the left.  No disruption.  Sinus walls and orbital walls are intact.  Poor dentition including periapical lucency of the medial incisor of the  maxilla. No mandible fracture. Pterygoid plates, zygomatic arches, and upper cervical spine are intact.  IMPRESSION: Frontal scalp hematoma.  No underlying calvarial fracture.  Focal hypodensity within the left thalamus may represent an age indeterminate lacunar  infarction.  Nasal bone fractures.  No additional maxillofacial bone fractures identified.  Original Report Authenticated By: Waneta Martins, [name]    Assessment/Plan Present on Admission:  .Seizure Alcohol abuse Admit to step down Seizure precautions  CIWA protocol Hypertension Poor control, when necessary blood pressure medications PTSD Stable resume home medications Tobacco abuse Nicotine patch   DVT prophylaxis Team 2/Dr.Ranga   Idalee Foxworthy 03/08/2011, 2:46 AM

## 2011-03-08 NOTE — ED Notes (Signed)
Report received from previous RN. First contact with patient. Pt is alert, oriented in bed. Resting at this moment. Denied pain right now. sts his cc was seizure. Portable chest xray at bedside.

## 2011-03-08 NOTE — ED Notes (Signed)
Transported patient to MRI

## 2011-03-08 NOTE — ED Notes (Signed)
Pt aware of the need for urine 

## 2011-03-08 NOTE — ED Notes (Signed)
Admitting doctor paged, pt will be downgraded to med/surg patient

## 2011-03-08 NOTE — Progress Notes (Signed)
I have seen and examined Miguel Hanson at bedside in the ED. I have also reviewed his chart. Called wife at [phone] but did not get a response and could not leave message. Will order mri brain/eeg/cxr/ua/uc and start dilantin. Patient was apparently on seizure meds till some time last year when his pcp stopped the meds, and he had not had problems till this point. This may well be just etoh withdrawal seizures but there is no way of telling. Please see rest of plan of care per DR Crosley. I have also added norvasc for better bp control. Miguel Canal, MD [phone].

## 2011-03-08 NOTE — ED Notes (Signed)
Wife's home phone: [phone] Aran Menning

## 2011-03-08 NOTE — ED Notes (Addendum)
Pt aware of the need for a urine sample, urinal at bedside. 

## 2011-03-09 ENCOUNTER — Inpatient Hospital Stay (HOSPITAL_COMMUNITY)
Admit: 2011-03-09 | Discharge: 2011-03-09 | Disposition: A | Discharge status: Home / Self Care | Payer: Medicare Other | Attending: Internal Medicine | Admitting: Internal Medicine

## 2011-03-09 LAB — CBC
HCT: 36.7 % — ABNORMAL LOW (ref 39.0–52.0)
Platelets: 106 10*3/uL — ABNORMAL LOW (ref 150–400)
RDW: 14.8 % (ref 11.5–15.5)
WBC: 11.2 10*3/uL — ABNORMAL HIGH (ref 4.0–10.5)

## 2011-03-09 LAB — BASIC METABOLIC PANEL
Chloride: 95 mEq/L — ABNORMAL LOW (ref 96–112)
Creatinine, Ser: 0.79 mg/dL (ref 0.50–1.35)
GFR calc Af Amer: >90 mL/min (ref >90)

## 2011-03-09 LAB — URINALYSIS, ROUTINE W REFLEX MICROSCOPIC
Leukocytes, UA: NEGATIVE
Nitrite: NEGATIVE
Specific Gravity, Urine: 1.012 (ref 1.005–1.030)
pH: 6 (ref 5.0–8.0)

## 2011-03-09 LAB — URINE CULTURE: Culture  Setup Time: 201301080206

## 2011-03-09 MED ORDER — LORAZEPAM 2 MG/ML IJ SOLN
INTRAMUSCULAR | Status: AC
  Administered 2011-03-09: 2 mg via INTRAVENOUS
  Filled 2011-03-09: qty 1

## 2011-03-09 MED ORDER — NICOTINE 21 MG/24HR TD PT24
21.0000 mg | MEDICATED_PATCH | Freq: Every day | TRANSDERMAL | Status: DC
  Administered 2011-03-09 – 2011-03-10 (×2): 21 mg via TRANSDERMAL
  Filled 2011-03-09 (×3): qty 1

## 2011-03-09 MED ORDER — LORAZEPAM 1 MG PO TABS
1.0000 mg | ORAL_TABLET | ORAL | Status: DC | PRN
  Administered 2011-03-09: 1 mg via ORAL
  Filled 2011-03-09: qty 1

## 2011-03-09 MED ORDER — LORAZEPAM 2 MG/ML IJ SOLN
1.0000 mg | INTRAMUSCULAR | Status: DC | PRN
  Administered 2011-03-09 (×2): 2 mg via INTRAVENOUS
  Filled 2011-03-09 (×2): qty 1

## 2011-03-09 MED ORDER — HALOPERIDOL LACTATE 5 MG/ML IJ SOLN
INTRAMUSCULAR | Status: AC
  Administered 2011-03-09: 5 mg
  Filled 2011-03-09: qty 1

## 2011-03-09 MED ORDER — LORAZEPAM 2 MG/ML IJ SOLN: 1.0000 mg | Freq: Once | INTRAMUSCULAR | Status: DC

## 2011-03-09 NOTE — Progress Notes (Signed)
*  PRELIMINARY RESULTS* EEG has been performed.  Miguel Hanson 03/09/2011, 11:36 AM

## 2011-03-09 NOTE — Procedures (Signed)
HISTORY:  A 66 year old male with history of seizures and alcohol abuse presenting with recurrent seizures.  MEDICATIONS:  Ativan, Lovenox, Norvasc, Haldol, lisinopril, Mag-Ox, Dilantin, potassium, Zoloft and vitamins.  CONDITIONS OF RECORDING:  This is a 16-channel EEG carried out with the patient in the asleep state.  DESCRIPTION:  The majority of the tracing the patient was resting and asleep.  The background activity was dominated by a low-voltage poorly organized delta rhythm that was seen over both hemispheres and continuous.  There was some superimposed beta activity that resembled sleep spindles.  No vertex central sharp transients were noted.  The patient was activated during the tracing and with painful stimulation there was noted to be reactivity in the tracing.  Hypoventilation and intermittent photic stimulation were not performed.  IMPRESSION:  This EEG is characterized by generalized background slowing.  Although, this can be seen with sleep cannot rule out the possibility of a diffuse disturbance that is etiologically nonspecific and may include a metabolic encephalopathy among other possibilities. Clinical correlation is recommended.  No epileptiform activity was noted.          ______________________________ Thana Farr, MD    [id number] D:  03/09/2011 17:58:15  T:  03/09/2011 21:08:39  Job #:  [id number]

## 2011-03-09 NOTE — Progress Notes (Signed)
SUBJECTIVE Mr Favila has been agitated today, requiring restraints for his own safety. He denies any complaints. Wants restraints removed.   1. Alcohol withdrawal     Past Medical History  Diagnosis Date  . Hypertension   . Seizures   . ETOH abuse   . Drug abuse    Current Facility-Administered Medications  Medication Dose Route Frequency Provider Last Rate Last Dose  . 0.9 % NaCl with KCl 20 mEq/ L  infusion   Intravenous Continuous Debby Crosley, MD 75 mL/hr at 03/08/11 0442    . alum & mag hydroxide-simeth (MAALOX/MYLANTA) 200-200-20 MG/5ML suspension 30 mL  30 mL Oral Q6H PRN Debby Crosley, MD      . amLODipine (NORVASC) tablet 5 mg  5 mg Oral Daily Jurnie Garritano   5 mg at 03/08/11 1153  . enoxaparin (LOVENOX) injection 40 mg  40 mg Subcutaneous Q24H Debby Crosley, MD   40 mg at 03/09/11 1432  . folic acid (FOLVITE) tablet 1 mg  1 mg Oral Daily Debby Crosley, MD   1 mg at 03/08/11 1339  . haloperidol lactate (HALDOL) 5 MG/ML injection        5 mg at 03/09/11 0727  . lisinopril (PRINIVIL,ZESTRIL) tablet 10 mg  10 mg Oral Daily Debby Crosley, MD   10 mg at 03/08/11 1357  . LORazepam (ATIVAN) tablet 1 mg  1 mg Oral Q4H PRN Mary A. Lynch   1 mg at 03/09/11 0527   Or  . LORazepam (ATIVAN) injection 1 mg  1 mg Intravenous Q4H PRN Mary A. Lynch   2 mg at 03/09/11 0728  . LORazepam (ATIVAN) injection 1 mg  1 mg Intravenous Once Mary A. Lynch      . LORazepam (ATIVAN) tablet 0-4 mg  0-4 mg Oral Q6H Debby Crosley, MD   2 mg at 03/09/11 0603   Followed by  . LORazepam (ATIVAN) tablet 0-4 mg  0-4 mg Oral Q12H Debby Crosley, MD      . magnesium oxide (MAG-OX) tablet 400 mg  400 mg Oral BID Debby Crosley, MD   400 mg at 03/08/11 2210  . moxifloxacin (AVELOX) IVPB 400 mg  400 mg Intravenous Q24H Estela Vinal   400 mg at 03/08/11 1650  . mulitivitamin with minerals tablet 1 tablet  1 tablet Oral Daily Debby Crosley, MD   1 tablet at 03/08/11 1339  . nicotine (NICODERM CQ - dosed in mg/24  hours) patch 21 mg  21 mg Transdermal Daily Mary A. Lynch   21 mg at 03/09/11 1113  . ondansetron (ZOFRAN) tablet 4 mg  4 mg Oral Q6H PRN Debby Crosley, MD       Or  . ondansetron (ZOFRAN) injection 4 mg  4 mg Intravenous Q6H PRN Debby Crosley, MD      . phenytoin (DILANTIN) tablet 100 mg  100 mg Oral TID Maguire Sime   100 mg at 03/08/11 2209  . sertraline (ZOLOFT) tablet 100 mg  100 mg Oral Daily Debby Crosley, MD   100 mg at 03/08/11 1339  . thiamine (VITAMIN B-1) tablet 100 mg  100 mg Oral Daily Debby Crosley, MD   100 mg at 03/08/11 1339   Or  . thiamine (B-1) injection 100 mg  100 mg Intravenous Daily Debby Crosley, MD   100 mg at 03/09/11 1114  . DISCONTD: LORazepam (ATIVAN) injection 1 mg  1 mg Intravenous Q6H PRN Debby Crosley, MD   1 mg at 03/09/11 0431  . DISCONTD: LORazepam (ATIVAN) tablet  1 mg  1 mg Oral Q6H PRN Gery Pray, MD      . DISCONTD: magnesium oxide (MAG-OX) tablet 400 mg  400 mg Oral BID Hyun Reali   400 mg at 03/08/11 1154   No Known Allergies Principal Problem:  *Seizure Active Problems:  Alcohol abuse  Hypertension  PTSD (post-traumatic stress disorder)   Vital signs in last 24 hours: Temp:  [98.3 F (36.8 C)-98.6 F (37 C)] 98.6 F (37 C) (01/06 2200) Pulse Rate:  [74-100] 100  (01/06 2200) Resp:  [18-20] 20  (01/06 2200) BP: (154)/(83-98) 154/98 mmHg (01/06 2200) SpO2:  [97 %-99 %] 99 % (01/06 2200) Weight:  [58.1 kg (128 lb 1.4 oz)] 128 lb 1.4 oz (58.1 kg) (01/06 1739) Weight change:  Last BM Date: 03/07/11  Intake/Output from previous day:   Intake/Output this shift:    Lab Results:  Basename 03/09/11 0010 03/08/11 1140  WBC 11.2* 12.8*  HGB 13.4 13.8  HCT 36.7* 37.8*  PLT 106* 106*   BMET  Basename 03/09/11 0010 03/08/11 1140  NA 132* 134*  K 3.8 3.4*  CL 95* 97  CO2 26 22  GLUCOSE 84 87  BUN 10 8  CREATININE 0.79 0.71  CALCIUM 8.9 8.9    Studies/Results: Ct Head Wo Contrast  03/08/2011  *RADIOLOGY REPORT*  Clinical  Data: Seizure, pain.  CT HEAD WITHOUT CONTRAST,CT MAXILLOFACIAL WITHOUT CONTRAST  Technique:  Contiguous axial images were obtained from the base of the skull through the vertex without contrast.,Technique: Multidetector CT imaging of the maxillofacial structures was performed. Multiplanar CT image reconstructions were also generated.  Comparison: 11/22/2010  Findings:  Head: Prominence of the sulci, cisterns, and ventricles, in keeping with volume loss. There are subcortical and periventricular white matter hypodensities, a nonspecific finding most often seen with chronic microangiopathic changes.  There is no evidence for acute hemorrhage, overt hydrocephalus, mass lesion, or abnormal extra-axial fluid collection.  No definite CT evidence for acute cortical based (large artery) infarction. Focal hypodensity within the left thalamus may represent an age indeterminate lacunar infarction.  Right frontal scalp hematoma.  No underlying calvarial fracture.  Maxillofacial:  Globes are symmetric.  Lenses located.  No retrobulbar hematoma.  Chronic sclerosis and thickening of the left sphenoid chamber with associate mucosal thickening and mucous/debris.  Otherwise, the visualized paranasal sinuses and mastoid air cells are clear.  Bilateral nasal bone fractures with diastases on the right at the articulation with the nasal process of the maxilla.  The nasal septum deviates to the left.  No disruption.  Sinus walls and orbital walls are intact.  Poor dentition including periapical lucency of the medial incisor of the maxilla. No mandible fracture. Pterygoid plates, zygomatic arches, and upper cervical spine are intact.  IMPRESSION: Frontal scalp hematoma.  No underlying calvarial fracture.  Focal hypodensity within the left thalamus may represent an age indeterminate lacunar infarction.  Nasal bone fractures.  No additional maxillofacial bone fractures identified.  Original Report Authenticated By: Waneta Martins, [name]     Mr Laqueta Jean ZO Contrast  03/08/2011  *RADIOLOGY REPORT*  Clinical Data: Seizures.  Alcohol withdrawal.  Abnormal CT head.  MRI HEAD WITHOUT AND WITH CONTRAST  Technique:  Multiplanar, multiecho pulse sequences of the brain and surrounding structures were obtained according to standard protocol without and with intravenous contrast  Contrast: 15mL MULTIHANCE GADOBENATE DIMEGLUMINE 529 MG/ML IV SOLN  Comparison: CT head without contrast 03/08/2011.  CT head without contrast 11/22/2010.  Findings: No significant lesion is evident  within the left thalamus to account for the CT findings.  This was likely artifactual.  The diffusion weighted images demonstrate no evidence for acute or subacute infarct.  Moderate to periventricular and subcortical white matter changes advanced for age.  Age advanced atrophy is present.  Flow is present in the major intracranial arteries.  The globes orbits are intact.  Mild mucosal thickening is present within the ethmoid air cells and frontal sinuses.  A small left sphenoid sinus is near completely opacified.  The mastoid air cells are clear.  The postcontrast images demonstrate no pathologic enhancement. Dedicated imaging of the temporal lobes demonstrates normal size and signal of the hippocampal structures bilaterally.  IMPRESSION:  1.  No focal lesion to account for the CT finding.  This may have been artifactual. 2.  No acute intracranial abnormality. 3.  Age advanced atrophy and diffuse white matter disease.  This may be related to chronic microvascular ischemia.  The global atrophy could be related to chronic alcohol abuse. 4.  Mild sinus disease as described.  Original Report Authenticated By: Jamesetta Orleans. MATTERN, [name]   Dg Chest Port 1 View  03/08/2011  *RADIOLOGY REPORT*  Clinical Data: Smoker with chest pain and shortness of breath.  PORTABLE CHEST - 1 VIEW  Comparison: Smoker with chest pain and shortness of breath.  Findings: Normal sized heart.  Hyperexpanded lungs  with diffuse peribronchial thickening and accentuation of the interstitial markings.  Bilateral nipple shadows.  Diffuse osteopenia.  IMPRESSION:  1.  No acute abnormality. 2.  Stable changes of COPD and chronic bronchitis.  Original Report Authenticated By: Darrol Angel, [name]   Ct Maxillofacial Wo Cm  03/08/2011  *RADIOLOGY REPORT*  Clinical Data: Seizure, pain.  CT HEAD WITHOUT CONTRAST,CT MAXILLOFACIAL WITHOUT CONTRAST  Technique:  Contiguous axial images were obtained from the base of the skull through the vertex without contrast.,Technique: Multidetector CT imaging of the maxillofacial structures was performed. Multiplanar CT image reconstructions were also generated.  Comparison: 11/22/2010  Findings:  Head: Prominence of the sulci, cisterns, and ventricles, in keeping with volume loss. There are subcortical and periventricular white matter hypodensities, a nonspecific finding most often seen with chronic microangiopathic changes.  There is no evidence for acute hemorrhage, overt hydrocephalus, mass lesion, or abnormal extra-axial fluid collection.  No definite CT evidence for acute cortical based (large artery) infarction. Focal hypodensity within the left thalamus may represent an age indeterminate lacunar infarction.  Right frontal scalp hematoma.  No underlying calvarial fracture.  Maxillofacial:  Globes are symmetric.  Lenses located.  No retrobulbar hematoma.  Chronic sclerosis and thickening of the left sphenoid chamber with associate mucosal thickening and mucous/debris.  Otherwise, the visualized paranasal sinuses and mastoid air cells are clear.  Bilateral nasal bone fractures with diastases on the right at the articulation with the nasal process of the maxilla.  The nasal septum deviates to the left.  No disruption.  Sinus walls and orbital walls are intact.  Poor dentition including periapical lucency of the medial incisor of the maxilla. No mandible fracture. Pterygoid plates, zygomatic  arches, and upper cervical spine are intact.  IMPRESSION: Frontal scalp hematoma.  No underlying calvarial fracture.  Focal hypodensity within the left thalamus may represent an age indeterminate lacunar infarction.  Nasal bone fractures.  No additional maxillofacial bone fractures identified.  Original Report Authenticated By: Waneta Martins, [name]    Medications: I have reviewed the patient's current medications.   Physical exam GENERAL- alert HEAD- normal atraumatic, no neck  masses, normal thyroid, no jvd RESPIRATORY- appears well, vitals normal, no respiratory distress, acyanotic, normal RR, ear and throat exam is normal, neck free of mass or lymphadenopathy, chest clear, no wheezing, crepitations, rhonchi, normal symmetric air entry CVS- regular rate and rhythm, S1, S2 normal, no murmur, click, rub or gallop ABDOMEN- abdomen is soft without significant tenderness, masses, organomegaly or guarding NEURO- disoriented in time. EXTREMITIES- extremities normal, atraumatic, no cyanosis or edema  Plan 1. Seizure activity in alcholic with history of seizures who came off meds in the past year, per his history- [facility] neurolog[facility], Dr Thad Ranger, who recommended resuming anti seizure meds as there is no way to distinguish withdrawal seizures from generic seizures. Continue dilantin. Check level in am. Follow eeg. 2. Sinusitis/copd- d2 avelox, to complete 7 days. 3. Alcoholism in withdrawal- continue CIWA, restraints as necessary. 4. Htn- Bp fluctuating, element of agitation. Continue current meds. 5. Depression- on setraline. Dvt/gi prophylaxis. Hopefully home soon    Durga Saldarriaga 03/09/2011 5:27 PM Pager: [phone].

## 2011-03-10 DIAGNOSIS — J329 Chronic sinusitis, unspecified: Secondary | ICD-10-CM | POA: Diagnosis present

## 2011-03-10 DIAGNOSIS — J449 Chronic obstructive pulmonary disease, unspecified: Secondary | ICD-10-CM | POA: Diagnosis present

## 2011-03-10 MED ORDER — MOXIFLOXACIN HCL 400 MG PO TABS
400.0000 mg | ORAL_TABLET | Freq: Every day | ORAL | Status: DC
  Administered 2011-03-10: 400 mg via ORAL
  Filled 2011-03-10 (×2): qty 1

## 2011-03-10 MED ORDER — THIAMINE HCL 100 MG PO TABS: 100.0000 mg | ORAL_TABLET | Freq: Every day | ORAL | Status: DC

## 2011-03-10 MED ORDER — PHENYTOIN 50 MG PO CHEW: 100.0000 mg | CHEWABLE_TABLET | Freq: Three times a day (TID) | ORAL | Status: DC

## 2011-03-10 MED ORDER — MOXIFLOXACIN HCL 400 MG PO TABS: 400.0000 mg | ORAL_TABLET | Freq: Every day | ORAL | Status: AC

## 2011-03-10 MED ORDER — ADULT MULTIVITAMIN W/MINERALS CH: 1.0000 | ORAL_TABLET | Freq: Every day | ORAL | Status: DC

## 2011-03-10 MED ORDER — AMLODIPINE BESYLATE 5 MG PO TABS: 5.0000 mg | ORAL_TABLET | Freq: Every day | ORAL | Status: DC

## 2011-03-10 NOTE — Discharge Summary (Signed)
DISCHARGE SUMMARY  Miguel Hanson  Miguel#: [id number]  DOB:12-Apr-1945  Date of Admission: 03/07/2011 Date of Discharge: 03/10/2011  Attending Physician:Maralyn Witherell  Patient's PCP:No primary provider on file.  Consults: none.  Discharge Diagnoses: Present on Admission:  .Seizure .Alcohol abuse .Hypertension .Sinusitis .COPD (chronic obstructive pulmonary disease)    Current Discharge Medication List    START taking these medications   Details  amLODipine (NORVASC) 5 MG tablet Take 1 tablet (5 mg total) by mouth daily. Qty: 30 tablet, Refills: 0    Multiple Vitamin (MULITIVITAMIN WITH MINERALS) TABS Take 1 tablet by mouth daily. Qty: 30 tablet, Refills: 0    phenytoin (DILANTIN) 50 MG tablet Chew 2 tablets (100 mg total) by mouth 3 (three) times daily. Qty: 45 tablet, Refills: 0      CONTINUE these medications which have NOT CHANGED   Details  lisinopril (PRINIVIL,ZESTRIL) 20 MG tablet Take 10 mg by mouth daily.      magnesium oxide (MAG-OX) 400 MG tablet Take 400 mg by mouth 2 (two) times daily.      sertraline (ZOLOFT) 100 MG tablet Take 100 mg by mouth daily.           Hospital Course: Miguel Hanson was admitted on 03/08/11 with history of seizures and fall at home. Patient reported history of seizures but per his wife he took himself off meds. Patient had been drinking night before admission, hence concern for alcohol withdrawal seizures. A brain MRI showed mild sinus disease but no acute abnormalities. He had leukocytosis likely due to sinus infection, versus copd exacerbation. I placed him on dilantin, and encouraged him to follow with his PCP, and to not take himself off meds. He developed alcohol withdrawal but today he is at baseline, ambulating without assistance. I discussed the plan of care with his wife over the phone. He is discharged in stable condition.   Day of Discharge BP 127/80  Pulse 105  Temp(Src) 98.6 F (37 C) (Oral)  Resp 18  Ht 5\' 9"  (1.753 m)   Wt 58.1 kg (128 lb 1.4 oz)  BMI 18.92 kg/m2  SpO2 97%  Physical Exam: At baseline.  Results for orders placed during the hospital encounter of 03/07/11 (from the past 24 hour(s))  URINALYSIS, ROUTINE W REFLEX MICROSCOPIC     Status: Abnormal   Collection Time   03/09/11  9:28 PM      Component Value Range   Color, Urine YELLOW  YELLOW    APPearance HAZY (*) CLEAR    Specific Gravity, Urine 1.012  1.005 - 1.030    pH 6.0  5.0 - 8.0    Glucose, UA NEGATIVE  NEGATIVE (mg/dL)   Hgb urine dipstick NEGATIVE  NEGATIVE    Bilirubin Urine NEGATIVE  NEGATIVE    Ketones, ur 40 (*) NEGATIVE (mg/dL)   Protein, ur NEGATIVE  NEGATIVE (mg/dL)   Urobilinogen, UA 0.2  0.0 - 1.0 (mg/dL)   Nitrite NEGATIVE  NEGATIVE    Leukocytes, UA NEGATIVE  NEGATIVE     Disposition: home today.   Follow-up Appts: Discharge Orders    Future Orders Please Complete By Expires   Diet - low sodium heart healthy      Increase activity slowly            Time spent in discharge (includes decision making & examination of pt): 20 minutes  Signed: Brewer Hanson 03/10/2011, 4:27 PM

## 2011-03-10 NOTE — Progress Notes (Signed)
Physical Therapy One-Time Evaluation Patient Details Name: Miguel Hanson MRN: [id number] DOB: 1945-07-21 Today's Date: 03/10/2011 [id number] Ev1   Problem List:  Patient Active Problem List  Diagnoses  . Seizure  . Alcohol abuse  . Hypertension  . PTSD (post-traumatic stress disorder)  . Sinusitis  . COPD (chronic obstructive pulmonary disease)    Past Medical History:  Past Medical History  Diagnosis Date  . Hypertension   . Seizures   . ETOH abuse   . Drug abuse    Past Surgical History: History reviewed. No pertinent past surgical history.  PT Assessment/Plan/Recommendation PT Assessment Clinical Impression Statement: Discussed with patient events that caused fall prior to admission.  Sounds as if could have had vasovagal response with straining on toilet.  Advised to try not to strain and educated in sitting awhile after first sitting up in bed and stand few seconds before taking off walking for postural accomodation to position changes.  Pt. verbalized understanding.  Also noted pt. with lower teeth loose and reports happened with fall, but has had eval for partial here in [place], but wants to pursue at [place].  Encouraged pt. to follow up and discussed ideas for meals with loose teeth.  No further PT needs at this time, but educated pt. to follow up with MD if having difficulty with mobility at home. PT Recommendation/Assessment: Patent does not need any further PT services No Skilled PT: All education completed;Patient at baseline level of functioning;Patient is modified independent with all activity/mobility PT Recommendation Follow Up Recommendations: No PT follow up Equipment Recommended: None recommended by PT PT Goals     PT Evaluation Precautions/Restrictions    Prior Functioning  Home Living Lives With: Spouse (wife has MS and has HHPT/aide/RN) Type of Home: House Home Layout: One level Home Access: Stairs to enter Entrance Stairs-Rails: Right Bathroom  Shower/Tub: Naval architect Equipment: Shower chair with back Prior Function Level of Independence: Independent with basic ADLs;Independent with transfers;Independent with gait Driving: No Cognition Cognition Arousal/Alertness: Awake/alert Overall Cognitive Status: Appears within functional limits for tasks assessed Sensation/Coordination   Extremity Assessment RLE Assessment RLE Assessment: Within Functional Limits (for ambulation, not formally assessed) LLE Assessment LLE Assessment: Within Functional Limits (for ambulation, not formally assessed) Mobility (including Balance) Bed Mobility Bed Mobility: Yes Supine to Sit: 7: Independent Sit to Supine: 7: Independent Transfers Transfers: Yes Sit to Stand: 6: Modified independent (Device/Increase time);With upper extremity assist Stand to Sit: 6: Modified independent (Device/Increase time);With upper extremity assist Ambulation/Gait Ambulation/Gait: Yes Ambulation/Gait Assistance: 6: Modified independent (Device/Increase time) Ambulation Distance (Feet): 90 Feet Assistive device: None Gait Pattern: Decreased stride length (wide based gait) Gait velocity: WNL  Balance Balance Assessed: Yes Static Standing Balance Static Standing - Balance Support: No upper extremity supported Static Standing - Level of Assistance: 7: Independent Static Standing - Comment/# of Minutes: Patient does have evidence for imbalance with ambulation, but reports walking is at baseline Exercise    End of Session PT - End of Session Activity Tolerance: Patient tolerated treatment well Patient left: in bed;with call bell in reach General Behavior During Session: [facility] for tasks performed Cognition: [facility] for tasks performed  [facility] 03/10/2011, 4:43 PM

## 2011-04-02 ENCOUNTER — Emergency Department (HOSPITAL_COMMUNITY): Payer: Medicare Other

## 2011-04-02 ENCOUNTER — Other Ambulatory Visit: Payer: Self-pay

## 2011-04-02 ENCOUNTER — Emergency Department (HOSPITAL_COMMUNITY)
Admission: EM | Admit: 2011-04-02 | Discharge: 2011-04-02 | Disposition: A | Discharge status: Home / Self Care | Payer: Medicare Other | Attending: Emergency Medicine | Admitting: Emergency Medicine

## 2011-04-02 ENCOUNTER — Encounter (HOSPITAL_COMMUNITY): Payer: Self-pay

## 2011-04-02 DIAGNOSIS — G40909 Epilepsy, unspecified, not intractable, without status epilepticus: Secondary | ICD-10-CM | POA: Insufficient documentation

## 2011-04-02 DIAGNOSIS — R569 Unspecified convulsions: Secondary | ICD-10-CM

## 2011-04-02 DIAGNOSIS — F17200 Nicotine dependence, unspecified, uncomplicated: Secondary | ICD-10-CM | POA: Insufficient documentation

## 2011-04-02 DIAGNOSIS — Z79899 Other long term (current) drug therapy: Secondary | ICD-10-CM | POA: Insufficient documentation

## 2011-04-02 DIAGNOSIS — I10 Essential (primary) hypertension: Secondary | ICD-10-CM | POA: Insufficient documentation

## 2011-04-02 HISTORY — DX: Post-traumatic stress disorder, unspecified: F43.10

## 2011-04-02 HISTORY — DX: Personal history of other specified conditions: Z87.898

## 2011-04-02 HISTORY — DX: Other psychoactive substance abuse, uncomplicated: F19.10

## 2011-04-02 HISTORY — DX: Alcohol abuse, uncomplicated: F10.10

## 2011-04-02 LAB — POCT I-STAT, CHEM 8
BUN: <3 mg/dL — ABNORMAL LOW (ref 6–23)
Calcium, Ion: 1.09 mmol/L — ABNORMAL LOW (ref 1.12–1.32)
Chloride: 104 mEq/L (ref 96–112)
Glucose, Bld: 113 mg/dL — ABNORMAL HIGH (ref 70–99)
HCT: 43 % (ref 39.0–52.0)
Potassium: 3.7 mEq/L (ref 3.5–5.1)

## 2011-04-02 LAB — PHENYTOIN LEVEL, TOTAL: Phenytoin Lvl: <2.5 ug/mL — ABNORMAL LOW (ref 10.0–20.0)

## 2011-04-02 MED ORDER — LORAZEPAM 1 MG PO TABS
1.0000 mg | ORAL_TABLET | Freq: Once | ORAL | Status: AC
  Administered 2011-04-02: 1 mg via ORAL
  Filled 2011-04-02: qty 1

## 2011-04-02 MED ORDER — SODIUM CHLORIDE 0.9 % IV SOLN
1000.0000 mg | INTRAVENOUS | Status: AC
  Administered 2011-04-02: 1000 mg via INTRAVENOUS
  Filled 2011-04-02: qty 20

## 2011-04-02 MED ORDER — PHENYTOIN SODIUM EXTENDED 100 MG PO CAPS: 100.0000 mg | ORAL_CAPSULE | Freq: Three times a day (TID) | ORAL | Status: DC

## 2011-04-02 NOTE — ED Notes (Signed)
Pt. Arrived EMS from , c/o witnessed sz at home falling out of bed.

## 2011-04-02 NOTE — ED Notes (Signed)
Pt placed on monitor, with continuous blood pressure and pulse oximetry

## 2011-04-02 NOTE — ED Provider Notes (Signed)
History     CSN: [id number]  Arrival date & time 04/02/11  0404   Chief Complaint  Patient presents with  . Seizures    pt. had witnessed sz this am, pt. fell out of bed.    HPI Pt was seen at 0445.  Per pt, c/o sudden onset and resolution of one episode of generalized seizure that began PTA.  Family at home witnessed the seizure, apparently pt fell out of bed while he had the seizure.  Pt states he has been "cutting down on my drinking" for the past 1-2 months.  Endorses he takes dilantin for seizures and "ran out of it" approx 3-4 days ago.  Denies CP/SOB, no cough, no fevers, no abd pain, no neck or back pain, no headache, no visual changes, no focal motor weakness, no tingling/numbness in extremities.      Past Medical History  Diagnosis Date  . Hypertension   . Seizures   . ETOH abuse   . Drug abuse   . PTSD (post-traumatic stress disorder)   . H/O prolonged Q-T interval on ECG   . Polysubstance abuse   . Alcohol abuse     No past surgical history on file.   History  Substance Use Topics  . Smoking status: Current Some Day Smoker  . Smokeless tobacco: Not on file  . Alcohol Use: Yes     abuse/binge    Review of Systems ROS: Statement: All systems negative except as marked or noted in the HPI; Constitutional: Negative for fever and chills. ; ; Eyes: Negative for eye pain, redness and discharge. ; ; ENMT: Negative for ear pain, hoarseness, nasal congestion, sinus pressure and sore throat. ; ; Cardiovascular: Negative for chest pain, palpitations, diaphoresis, dyspnea and peripheral edema. ; ; Respiratory: Negative for cough, wheezing and stridor. ; ; Gastrointestinal: Negative for nausea, vomiting, diarrhea, abdominal pain, blood in stool, hematemesis, jaundice and rectal bleeding. . ; ; Genitourinary: Negative for dysuria, flank pain and hematuria. ; ; Musculoskeletal: Negative for back pain and neck pain. Negative for swelling, +fall OOB.; ; Skin: Negative for pruritus,  rash, abrasions, blisters, bruising and skin lesion.; ; Neuro: Negative for headache, lightheadedness and neck stiffness. Negative for weakness, altered mental status, extremity weakness, paresthesias, and +seizure.     Allergies  Review of patient's allergies indicates no known allergies.  Home Medications   Current Outpatient Rx  Name Route Sig Dispense Refill  . AMLODIPINE BESYLATE 5 MG PO TABS Oral Take 1 tablet (5 mg total) by mouth daily. 30 tablet 0  . LISINOPRIL 20 MG PO TABS Oral Take 10 mg by mouth daily.      Marland Kitchen MAGNESIUM OXIDE 400 MG PO TABS Oral Take 400 mg by mouth 2 (two) times daily.      . ADULT MULTIVITAMIN W/MINERALS CH Oral Take 1 tablet by mouth daily. 30 tablet 0  . PHENYTOIN 50 MG PO CHEW Oral Chew 2 tablets (100 mg total) by mouth 3 (three) times daily. 45 tablet 0  . SERTRALINE HCL 100 MG PO TABS Oral Take 100 mg by mouth daily.      . THIAMINE HCL 100 MG PO TABS Oral Take 1 tablet (100 mg total) by mouth daily. 15 tablet 0    BP 167/123  Pulse 102  Temp(Src) 97.9 F (36.6 C) (Oral)  Resp 20  SpO2 97%  Physical Exam 0450: Physical examination:  Nursing notes reviewed; Vital signs and O2 SAT reviewed;  Constitutional: Well developed, Well nourished,  Well hydrated, Disheveled, In no acute distress; Head:  Normocephalic, atraumatic; Eyes: EOMI, PERRL, No scleral icterus; ENMT: Mouth and pharynx normal, Mucous membranes moist; Neck: Supple, Full range of motion, No lymphadenopathy; Cardiovascular: Regular rate and rhythm, No murmur, rub, or gallop; Respiratory: Breath sounds clear & equal bilaterally, No rales, rhonchi, wheezes, or rub, Normal respiratory effort/excursion; Chest: Nontender, Movement normal; Abdomen: Soft, Nontender, Nondistended, Normal bowel sounds; Extremities: Pulses normal, No tenderness, No edema, No calf edema or asymmetry.; Neuro: AA&Ox3, Major CN grossly intact. Speech clear, no facial droop.  No gross focal motor or sensory deficits in  extremities.; Skin: Color normal, Warm, Dry, no rash.    ED Course  Procedures    MDM  MDM Reviewed: nursing note, previous chart and vitals Reviewed previous: ECG Interpretation: ECG, labs and CT scan    Date: 04/02/2011  Rate: 97  Rhythm: normal sinus rhythm  QRS Axis: normal  Intervals: QT prolonged  ST/T Wave abnormalities: normal  Conduction Disutrbances:first-degree A-V block   Narrative Interpretation:   Old EKG Reviewed: unchanged; no significant changes from previous EKG dated 11/23/2010.   Results for orders placed during the hospital encounter of 04/02/11  PHENYTOIN LEVEL, TOTAL      Component Value Range   Phenytoin Lvl <2.5 (*) 10.0 - 20.0 (ug/mL)  ETHANOL      Component Value Range   Alcohol, Ethyl (B) <11  0 - 11 (mg/dL)  POCT I-STAT, CHEM 8      Component Value Range   Sodium 140  135 - 145 (mEq/L)   Potassium 3.7  3.5 - 5.1 (mEq/L)   Chloride 104  96 - 112 (mEq/L)   BUN <3 (*) 6 - 23 (mg/dL)   Creatinine, Ser 1.61  0.50 - 1.35 (mg/dL)   Glucose, Bld 096 (*) 70 - 99 (mg/dL)   Calcium, Ion 0.45 (*) 1.12 - 1.32 (mmol/L)   TCO2 22  0 - 100 (mmol/L)   Hemoglobin 14.6  13.0 - 17.0 (g/dL)   HCT 40.9  81.1 - 91.4 (%)   Ct Head Wo Contrast 04/02/2011  *RADIOLOGY REPORT*  Clinical Data: Larey Seat down.  Possible seizure.  CT HEAD WITHOUT CONTRAST  Technique:  Contiguous axial images were obtained from the base of the skull through the vertex without contrast.  Comparison: 03/08/2011  Findings: Mild cerebral atrophy.  Low attenuation change in the deep white matter consistent with small vessel ischemia.  No focal mass effect or midline shift.  No abnormal extra-axial fluid collections.  Gray-white matter junctions are distinct.  Basal cisterns are not effaced.  No evidence of acute intracranial hemorrhage.  Intracranial contents are stable since previous study. Sclerosis in the left sphenoid bone without expansile change.  No depressed skull fractures.  Displaced nasal  bone fractures appears stable since prior study.  Opacification and air fluid level in the left maxillary antrum appears new and could be due to acute sinus disease.  Inflammatory mucosal thickening in the ethmoid air cells.  IMPRESSION: No evidence of acute intracranial hemorrhage, mass lesion, or acute infarct.  The inflammatory changes in the paranasal sinuses, inflammatory changes in the paranasal sinuses, possibly acute.  Old nasal bone fractures.  Nonspecific sclerosis in the left sphenoid bone.  Original Report Authenticated By: Marlon Pel, [name]     7:17 AM:  Pt not taking his dilantin.  Will give IV dilantin load here, rx for same.  Per EPIC chart review, pt was admitted earlier this month for same symptoms (seizure).  It  was undetermined at that time if pt's seizures were due to etoh withdrawal vs seizure disorder, and Neuro MD recommended pt take dilantin.  Pt states he feels "ok," back to his baseline, and "maybe just a little anxious."  Will dose ativan, pt agreeable.  Denies feeling "shakey," no tremors.  Neuro exam unchanged, resps easy, HR 100 during my re-eval.  Sign out to Dr. Fonnie Jarvis.       Tyrene Nader Allison Quarry, DO 04/02/11 2318

## 2011-04-02 NOTE — ED Notes (Signed)
Pt in room with practitioner.

## 2011-04-02 NOTE — ED Provider Notes (Signed)
The patient appears awake alert nontoxic and will call for ride home as his Dilantin has been infused without apparent immediate complication.  Hurman Horn, MD 04/02/11 [phone]

## 2011-05-05 ENCOUNTER — Emergency Department (HOSPITAL_COMMUNITY)
Admission: EM | Admit: 2011-05-05 | Discharge: 2011-05-05 | Disposition: A | Discharge status: Home / Self Care | Payer: Medicare Other | Attending: Emergency Medicine | Admitting: Emergency Medicine

## 2011-05-05 ENCOUNTER — Encounter (HOSPITAL_COMMUNITY): Payer: Self-pay | Admitting: *Deleted

## 2011-05-05 DIAGNOSIS — I10 Essential (primary) hypertension: Secondary | ICD-10-CM | POA: Insufficient documentation

## 2011-05-05 DIAGNOSIS — R569 Unspecified convulsions: Secondary | ICD-10-CM

## 2011-05-05 DIAGNOSIS — R4182 Altered mental status, unspecified: Secondary | ICD-10-CM | POA: Insufficient documentation

## 2011-05-05 DIAGNOSIS — G40909 Epilepsy, unspecified, not intractable, without status epilepticus: Secondary | ICD-10-CM | POA: Insufficient documentation

## 2011-05-05 DIAGNOSIS — Z79899 Other long term (current) drug therapy: Secondary | ICD-10-CM | POA: Insufficient documentation

## 2011-05-05 LAB — CBC
HCT: 34.6 % — ABNORMAL LOW (ref 39.0–52.0)
Hemoglobin: 12.7 g/dL — ABNORMAL LOW (ref 13.0–17.0)
MCH: 33.9 pg (ref 26.0–34.0)
MCHC: 36.7 g/dL — ABNORMAL HIGH (ref 30.0–36.0)
RBC: 3.75 MIL/uL — ABNORMAL LOW (ref 4.22–5.81)

## 2011-05-05 LAB — PHENYTOIN LEVEL, TOTAL: Phenytoin Lvl: <2.5 ug/mL — ABNORMAL LOW (ref 10.0–20.0)

## 2011-05-05 LAB — BASIC METABOLIC PANEL
BUN: 5 mg/dL — ABNORMAL LOW (ref 6–23)
CO2: 22 mEq/L (ref 19–32)
Glucose, Bld: 168 mg/dL — ABNORMAL HIGH (ref 70–99)
Potassium: 3.4 mEq/L — ABNORMAL LOW (ref 3.5–5.1)
Sodium: 129 mEq/L — ABNORMAL LOW (ref 135–145)

## 2011-05-05 MED ORDER — SODIUM CHLORIDE 0.9 % IV SOLN
1000.0000 mg | Freq: Once | INTRAVENOUS | Status: AC
  Administered 2011-05-05: 1000 mg via INTRAVENOUS
  Filled 2011-05-05: qty 20

## 2011-05-05 MED ORDER — ONDANSETRON HCL 4 MG/2ML IJ SOLN
INTRAMUSCULAR | Status: AC
  Administered 2011-05-05: 4 mg via INTRAVENOUS
  Filled 2011-05-05: qty 2

## 2011-05-05 MED ORDER — ONDANSETRON HCL 4 MG/2ML IJ SOLN
4.0000 mg | Freq: Once | INTRAMUSCULAR | Status: AC
  Administered 2011-05-05: 4 mg via INTRAVENOUS

## 2011-05-05 MED ORDER — LORAZEPAM 2 MG/ML IJ SOLN
2.0000 mg | Freq: Once | INTRAMUSCULAR | Status: AC
  Administered 2011-05-05: 2 mg via INTRAVENOUS
  Filled 2011-05-05: qty 1

## 2011-05-05 NOTE — ED Notes (Signed)
Per EMS: pt from home with c/o of seizures. Wife called ems because pt was having seizures. Pt has hx of seizures associated with ETOH. Per the wife pt was drinking heavy yesterday. Pt is non-compliant with seizure medications. Pt is post-ictal. Pt is A&O, incontinent of urine. Per the wife this is the second seizure today. Pt was lying in bed when seizure occurred. ems reports no head injury on pt.

## 2011-05-05 NOTE — ED Notes (Signed)
[id number]<JY> Expected date:<BR> Expected time:<BR> Means of arrival:<BR> Comments:<BR> EMS/seizure

## 2011-05-05 NOTE — ED Provider Notes (Addendum)
History     CSN: [id number]  Arrival date & time 05/05/11  0530   First MD Initiated Contact with Patient 05/05/11 0534      Chief Complaint  Patient presents with  . Seizures    (Consider location/radiation/quality/duration/timing/severity/associated sxs/prior treatment) HPI Comments: Pt has hx of substance abuse with frequent alcohol withdrawal seizures - was seen one month ago for same - wife states that he was in bed this AM and had acute onset of tonic-clonic activity. Lasted several minutes and resolve spontaneously. He was postictal by the time that paramedics arrived and found him to be confused but not seizing. His vital signs in route showed that he was hypertensive per EMS he had no further seizure activity. He was not given any medications by EMS. According to his wife his last drink was earlier in the day yesterday, he is noncompliant with his medications including his phenytoin and this is a chronic problem for him. He has been known to have alcohol withdrawal seizures in the past. Currently the patient is confused and unable to contribute to the history  Patient is a 66 y.o. male presenting with seizures. The history is provided by the EMS personnel, a relative and medical records. The history is limited by the condition of the patient (Altered mental status).  Seizures     Past Medical History  Diagnosis Date  . Hypertension   . Seizures   . ETOH abuse   . Drug abuse   . PTSD (post-traumatic stress disorder)   . H/O prolonged Q-T interval on ECG   . Polysubstance abuse   . Alcohol abuse     History reviewed. No pertinent past surgical history.  History reviewed. No pertinent family history.  History  Substance Use Topics  . Smoking status: Current Some Day Smoker  . Smokeless tobacco: Not on file  . Alcohol Use: Yes     abuse/binge      Review of Systems  Unable to perform ROS: Mental status change  Neurological: Positive for seizures.    Allergies    Review of patient's allergies indicates no known allergies.  Home Medications   Current Outpatient Rx  Name Route Sig Dispense Refill  . AMLODIPINE BESYLATE 5 MG PO TABS Oral Take 1 tablet (5 mg total) by mouth daily. 30 tablet 0  . LISINOPRIL 20 MG PO TABS Oral Take 10 mg by mouth daily.      Marland Kitchen MAGNESIUM OXIDE 400 MG PO TABS Oral Take 400 mg by mouth 2 (two) times daily.      . ADULT MULTIVITAMIN W/MINERALS CH Oral Take 1 tablet by mouth daily. 30 tablet 0  . PHENYTOIN SODIUM EXTENDED 100 MG PO CAPS Oral Take 1 capsule (100 mg total) by mouth 3 (three) times daily. 35 capsule 0  . PHENYTOIN 50 MG PO CHEW Oral Chew 2 tablets (100 mg total) by mouth 3 (three) times daily. 45 tablet 0  . SERTRALINE HCL 100 MG PO TABS Oral Take 100 mg by mouth daily.      . THIAMINE HCL 100 MG PO TABS Oral Take 1 tablet (100 mg total) by mouth daily. 15 tablet 0    BP 156/113  Pulse 111  Temp(Src) 98.3 F (36.8 C) (Oral)  Resp 22  SpO2 98%  Physical Exam  Nursing note and vitals reviewed. Constitutional: He appears well-developed and well-nourished. No distress.  HENT:  Head: Normocephalic and atraumatic.  Mouth/Throat: Oropharynx is clear and moist. No oropharyngeal exudate.  Eyes:  Conjunctivae and EOM are normal. Pupils are equal, round, and reactive to light. Right eye exhibits no discharge. Left eye exhibits no discharge. No scleral icterus.  Neck: Normal range of motion. Neck supple. No JVD present. No thyromegaly present.  Cardiovascular: Normal rate, regular rhythm, normal heart sounds and intact distal pulses.  Exam reveals no gallop and no friction rub.   No murmur heard. Pulmonary/Chest: Effort normal and breath sounds normal. No respiratory distress. He has no wheezes. He has no rales.  Abdominal: Soft. Bowel sounds are normal. He exhibits no distension and no mass. There is no tenderness.  Musculoskeletal: Normal range of motion. He exhibits no edema and no tenderness.  Lymphadenopathy:     He has no cervical adenopathy.  Neurological: He is alert. Coordination normal.       Speech is clear, disoriented, moves all extremities to command  Skin: Skin is warm and dry. No rash noted. No erythema.  Psychiatric: He has a normal mood and affect. His behavior is normal.    ED Course  Procedures (including critical care time)  Labs Reviewed  PHENYTOIN LEVEL, TOTAL - Abnormal; Notable for the following:    Phenytoin Lvl <2.5 (*)    All other components within normal limits  CBC - Abnormal; Notable for the following:    RBC 3.75 (*)    Hemoglobin 12.7 (*)    HCT 34.6 (*)    MCHC 36.7 (*)    RDW 17.2 (*)    Platelets 93 (*)    All other components within normal limits  BASIC METABOLIC PANEL - Abnormal; Notable for the following:    Sodium 129 (*)    Potassium 3.4 (*)    Chloride 91 (*)    Glucose, Bld 168 (*)    BUN 5 (*)    All other components within normal limits   No results found.   1. Seizure       MDM  There is no sign of tongue biting or urinary incontinence, patient appears to be post ictal based on the wife's description of tonic-clonic activity. Due to history of frequent seizures related to alcohol withdrawal and to medication noncompliance labs have been ordered, Ativan has been ordered, reevaluate. Seizure precautions initiated  Tachycardia has resolved completely, patient feels improved, is now at baseline mental status and is alert and oriented. He admits that he has not been taking his seizure medications but is unable to give a reason why since he does endorse having the medications at home. Have encouraged him to reduce his alcohol take, have given him a load of his Dilantin here and will discharge the patient home. The patient refuses to stay for further treatment and evaluation  Laboratory results reveal slight hyponatremia and subtherapeutic level of Dilantin   Vida Roller, MD 05/05/11 9811  Vida Roller, MD 05/05/11 [phone]

## 2011-05-05 NOTE — Discharge Instructions (Signed)
Please reduce the amount of alcohol that you drink, take your seizure medications. You had told me that have a supply of your seizure medicines at home, TAKE THEM EVERY DAY.  Return to the emergency department for seizures that persist despite taking her medication

## 2011-07-05 ENCOUNTER — Emergency Department (HOSPITAL_COMMUNITY): Payer: Medicare Other

## 2011-07-05 ENCOUNTER — Encounter (HOSPITAL_COMMUNITY): Payer: Self-pay

## 2011-07-05 ENCOUNTER — Inpatient Hospital Stay (HOSPITAL_COMMUNITY)
Admission: EM | Admit: 2011-07-05 | Discharge: 2011-07-07 | DRG: 101 | Disposition: A | Discharge status: Home / Self Care | Payer: Medicare Other | Attending: Internal Medicine | Admitting: Internal Medicine

## 2011-07-05 DIAGNOSIS — E871 Hypo-osmolality and hyponatremia: Secondary | ICD-10-CM | POA: Diagnosis present

## 2011-07-05 DIAGNOSIS — R64 Cachexia: Secondary | ICD-10-CM | POA: Diagnosis present

## 2011-07-05 DIAGNOSIS — F10239 Alcohol dependence with withdrawal, unspecified: Secondary | ICD-10-CM | POA: Diagnosis present

## 2011-07-05 DIAGNOSIS — F1020 Alcohol dependence, uncomplicated: Secondary | ICD-10-CM | POA: Diagnosis present

## 2011-07-05 DIAGNOSIS — G40309 Generalized idiopathic epilepsy and epileptic syndromes, not intractable, without status epilepticus: Secondary | ICD-10-CM

## 2011-07-05 DIAGNOSIS — Z681 Body mass index (BMI) 19 or less, adult: Secondary | ICD-10-CM

## 2011-07-05 DIAGNOSIS — F4310 Post-traumatic stress disorder, unspecified: Secondary | ICD-10-CM | POA: Diagnosis present

## 2011-07-05 DIAGNOSIS — J449 Chronic obstructive pulmonary disease, unspecified: Secondary | ICD-10-CM

## 2011-07-05 DIAGNOSIS — Z91199 Patient's noncompliance with other medical treatment and regimen due to unspecified reason: Secondary | ICD-10-CM

## 2011-07-05 DIAGNOSIS — I10 Essential (primary) hypertension: Secondary | ICD-10-CM

## 2011-07-05 DIAGNOSIS — R569 Unspecified convulsions: Secondary | ICD-10-CM

## 2011-07-05 DIAGNOSIS — Z9119 Patient's noncompliance with other medical treatment and regimen: Secondary | ICD-10-CM

## 2011-07-05 DIAGNOSIS — J329 Chronic sinusitis, unspecified: Secondary | ICD-10-CM

## 2011-07-05 DIAGNOSIS — G40909 Epilepsy, unspecified, not intractable, without status epilepticus: Secondary | ICD-10-CM | POA: Diagnosis present

## 2011-07-05 DIAGNOSIS — F1010 Alcohol abuse, uncomplicated: Secondary | ICD-10-CM

## 2011-07-05 DIAGNOSIS — F10939 Alcohol use, unspecified with withdrawal, unspecified: Secondary | ICD-10-CM | POA: Diagnosis present

## 2011-07-05 DIAGNOSIS — J4489 Other specified chronic obstructive pulmonary disease: Secondary | ICD-10-CM | POA: Diagnosis present

## 2011-07-05 LAB — RAPID URINE DRUG SCREEN, HOSP PERFORMED
Barbiturates: NOT DETECTED
Cocaine: NOT DETECTED
Opiates: NOT DETECTED

## 2011-07-05 LAB — CBC
Hemoglobin: 12.8 g/dL — ABNORMAL LOW (ref 13.0–17.0)
MCH: 38.4 pg — ABNORMAL HIGH (ref 26.0–34.0)
MCHC: 37.9 g/dL — ABNORMAL HIGH (ref 30.0–36.0)
MCV: 101.5 fL — ABNORMAL HIGH (ref 78.0–100.0)
RBC: 3.33 MIL/uL — ABNORMAL LOW (ref 4.22–5.81)

## 2011-07-05 LAB — COMPREHENSIVE METABOLIC PANEL
ALT: 21 U/L (ref 0–53)
BUN: 4 mg/dL — ABNORMAL LOW (ref 6–23)
CO2: 25 mEq/L (ref 19–32)
Calcium: 8.7 mg/dL (ref 8.4–10.5)
Creatinine, Ser: 0.62 mg/dL (ref 0.50–1.35)
GFR calc Af Amer: >90 mL/min (ref >90)
GFR calc non Af Amer: >90 mL/min (ref >90)
Glucose, Bld: 115 mg/dL — ABNORMAL HIGH (ref 70–99)

## 2011-07-05 LAB — URINALYSIS, ROUTINE W REFLEX MICROSCOPIC
Bilirubin Urine: NEGATIVE
Glucose, UA: NEGATIVE mg/dL
Nitrite: NEGATIVE
Specific Gravity, Urine: 1.017 (ref 1.005–1.030)
pH: 7 (ref 5.0–8.0)

## 2011-07-05 LAB — URINE MICROSCOPIC-ADD ON

## 2011-07-05 LAB — AMMONIA: Ammonia: 25 umol/L (ref 11–60)

## 2011-07-05 LAB — DIFFERENTIAL
Basophils Relative: 1 % (ref 0–1)
Eosinophils Absolute: 0.2 10*3/uL (ref 0.0–0.7)
Eosinophils Relative: 4 % (ref 0–5)
Lymphs Abs: 1.2 10*3/uL (ref 0.7–4.0)
Monocytes Absolute: 0.3 10*3/uL (ref 0.1–1.0)
Monocytes Relative: 8 % (ref 3–12)

## 2011-07-05 LAB — ETHANOL: Alcohol, Ethyl (B): <11 mg/dL (ref 0–11)

## 2011-07-05 LAB — PHENYTOIN LEVEL, TOTAL: Phenytoin Lvl: <2.5 ug/mL — ABNORMAL LOW (ref 10.0–20.0)

## 2011-07-05 MED ORDER — LORAZEPAM 2 MG/ML IJ SOLN
INTRAMUSCULAR | Status: AC
  Administered 2011-07-05: 2 mg
  Filled 2011-07-05: qty 1

## 2011-07-05 MED ORDER — SODIUM CHLORIDE 0.9 % IV SOLN
Freq: Once | INTRAVENOUS | Status: AC
  Administered 2011-07-05: 19:00:00 via INTRAVENOUS

## 2011-07-05 MED ORDER — ONDANSETRON HCL 4 MG/2ML IJ SOLN
INTRAMUSCULAR | Status: AC
  Administered 2011-07-05: 4 mg
  Filled 2011-07-05: qty 2

## 2011-07-05 MED ORDER — ONDANSETRON HCL 4 MG/2ML IJ SOLN: 4.0000 mg | Freq: Once | INTRAMUSCULAR | Status: DC

## 2011-07-05 MED ORDER — LORAZEPAM 2 MG/ML IJ SOLN: 2.0000 mg | Freq: Once | INTRAMUSCULAR | Status: DC

## 2011-07-05 NOTE — ED Notes (Signed)
Per EMS- Patient's wife reports that she heard the patient fall to the floor and patient was having a seizure. Patient was postictal upon arrival and upon arrival to the ED is awake, but remains confused. Patient's wife states that seizures are alcohol related. Patient drank heavily yesterday according to  patient's wife. Last seizure was 3 months ago.

## 2011-07-05 NOTE — ED Notes (Signed)
Patient transported to CT 

## 2011-07-05 NOTE — ED Notes (Signed)
Pt returned from radiology diaphoretic, nonverbal and attempting to vomit, pt very resistive to assistive measures. Pt suctioned, tooth found in mouth. Dr. Freida Busman at bedside.
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
## 2011-07-05 NOTE — ED Provider Notes (Signed)
History     CSN: [id number]  Arrival date & time 07/05/11  1746   First MD Initiated Contact with Patient 07/05/11 1837      Chief Complaint  Patient presents with  . Seizures    (Consider location/radiation/quality/duration/timing/severity/associated sxs/prior treatment) Patient is a 66 y.o. male presenting with seizures.  Seizures     Past Medical History  Diagnosis Date  . Hypertension   . Seizures   . ETOH abuse   . Drug abuse   . PTSD (post-traumatic stress disorder)   . H/O prolonged Q-T interval on ECG   . Polysubstance abuse   . Alcohol abuse     History reviewed. No pertinent past surgical history.  No family history on file.  History  Substance Use Topics  . Smoking status: Current Some Day Smoker  . Smokeless tobacco: Not on file  . Alcohol Use: Yes     abuse/binge      Review of Systems  Unable to perform ROS Neurological: Positive for seizures.    Allergies  Review of patient's allergies indicates no known allergies.  Home Medications   Current Outpatient Rx  Name Route Sig Dispense Refill  . AMLODIPINE BESYLATE 5 MG PO TABS Oral Take 1 tablet (5 mg total) by mouth daily. 30 tablet 0  . LISINOPRIL 20 MG PO TABS Oral Take 10 mg by mouth daily.      Marland Kitchen PHENYTOIN SODIUM EXTENDED 100 MG PO CAPS Oral Take 1 capsule (100 mg total) by mouth 3 (three) times daily. 35 capsule 0  . SERTRALINE HCL 100 MG PO TABS Oral Take 100 mg by mouth daily.      Marland Kitchen MAGNESIUM OXIDE 400 MG PO TABS Oral Take 400 mg by mouth 2 (two) times daily.      . ADULT MULTIVITAMIN W/MINERALS CH Oral Take 1 tablet by mouth daily. 30 tablet 0  . THIAMINE HCL 100 MG PO TABS Oral Take 1 tablet (100 mg total) by mouth daily. 15 tablet 0    BP 162/105  Pulse 106  Temp(Src) 98.5 F (36.9 C) (Oral)  Resp 18  SpO2 100%  Physical Exam  Nursing note and vitals reviewed. Constitutional: He appears well-developed and well-nourished. He appears cachectic.  Non-toxic appearance. No  distress.  HENT:  Head: Normocephalic and atraumatic.  Eyes: Conjunctivae, EOM and lids are normal. Pupils are equal, round, and reactive to light.  Neck: Normal range of motion. Neck supple. No tracheal deviation present. No mass present.  Cardiovascular: Normal rate, regular rhythm and normal heart sounds.  Exam reveals no gallop.   No murmur heard. Pulmonary/Chest: Effort normal and breath sounds normal. No stridor. No respiratory distress. He has no decreased breath sounds. He has no wheezes. He has no rhonchi. He has no rales.  Abdominal: Soft. Normal appearance and bowel sounds are normal. He exhibits no distension. There is no tenderness. There is no rebound and no CVA tenderness.  Musculoskeletal: Normal range of motion. He exhibits no edema and no tenderness.  Neurological: He is alert. He has normal strength. No cranial nerve deficit or sensory deficit.       Moves all 4 extremities  Skin: Skin is warm and dry. No abrasion and no rash noted.  Psychiatric: He has a normal mood and affect. His affect is not blunt. His speech is delayed. He is slowed.    ED Course  Procedures (including critical care time)   Labs Reviewed  CBC  DIFFERENTIAL  COMPREHENSIVE METABOLIC PANEL  ETHANOL  URINE RAPID DRUG SCREEN (HOSP PERFORMED)  URINALYSIS, ROUTINE W REFLEX MICROSCOPIC  URINE CULTURE  PHENYTOIN LEVEL, TOTAL   No results found.   No diagnosis found.    MDM  Patient had a witnessed tonic-clonic seizure here and was given Ativan and responded well to this. Known history of seizure disorder secondary to EtOH abuse which is confirmed by his daughter today. His alcohol level here today as 0 on suspect that the cause of his seizure is from this. And head CTand cspine ct  are negative. He will be admitted  CRITICAL CARE Performed by: Toy Baker   Total critical care time: 70  Critical care time was exclusive of separately billable procedures and treating other  patients.  Critical care was necessary to treat or prevent imminent or life-threatening deterioration.  Critical care was time spent personally by me on the following activities: development of treatment plan with patient and/or surrogate as well as nursing, discussions with consultants, evaluation of patient's response to treatment, examination of patient, obtaining history from patient or surrogate, ordering and performing treatments and interventions, ordering and review of laboratory studies, ordering and review of radiographic studies, pulse oximetry and re-evaluation of patient's condition.        Toy Baker, MD 07/05/11 [phone]

## 2011-07-05 NOTE — ED Notes (Signed)
OZH:YQMVH<QI> Expected date:07/05/11<BR> Expected time: 5:45 PM<BR> Means of arrival:Ambulance<BR> Comments:<BR> M61. 65 m. SZ, hx of same. 10-12 mins

## 2011-07-06 DIAGNOSIS — E782 Mixed hyperlipidemia: Secondary | ICD-10-CM

## 2011-07-06 DIAGNOSIS — I10 Essential (primary) hypertension: Secondary | ICD-10-CM

## 2011-07-06 DIAGNOSIS — G40309 Generalized idiopathic epilepsy and epileptic syndromes, not intractable, without status epilepticus: Secondary | ICD-10-CM

## 2011-07-06 LAB — URINE CULTURE: Culture  Setup Time: 201305060233

## 2011-07-06 LAB — CBC
MCH: 37.6 pg — ABNORMAL HIGH (ref 26.0–34.0)
MCV: 102.9 fL — ABNORMAL HIGH (ref 78.0–100.0)
Platelets: 109 10*3/uL — ABNORMAL LOW (ref 150–400)
RDW: 17 % — ABNORMAL HIGH (ref 11.5–15.5)

## 2011-07-06 LAB — BASIC METABOLIC PANEL
CO2: 25 mEq/L (ref 19–32)
Calcium: 8.9 mg/dL (ref 8.4–10.5)
Creatinine, Ser: 0.64 mg/dL (ref 0.50–1.35)
GFR calc non Af Amer: >90 mL/min (ref >90)
Glucose, Bld: 105 mg/dL — ABNORMAL HIGH (ref 70–99)

## 2011-07-06 LAB — PHENYTOIN LEVEL, TOTAL: Phenytoin Lvl: <2.5 ug/mL — ABNORMAL LOW (ref 10.0–20.0)

## 2011-07-06 LAB — MRSA PCR SCREENING: MRSA by PCR: NEGATIVE

## 2011-07-06 MED ORDER — LORAZEPAM 1 MG PO TABS
0.0000 mg | ORAL_TABLET | Freq: Four times a day (QID) | ORAL | Status: DC
  Administered 2011-07-06: 2 mg via ORAL
  Administered 2011-07-06 – 2011-07-07 (×2): 1 mg via ORAL
  Filled 2011-07-06: qty 2

## 2011-07-06 MED ORDER — HALOPERIDOL LACTATE 5 MG/ML IJ SOLN
INTRAMUSCULAR | Status: AC
  Filled 2011-07-06: qty 1

## 2011-07-06 MED ORDER — SODIUM CHLORIDE 0.9 % IV SOLN
1000.0000 mg | Freq: Once | INTRAVENOUS | Status: AC
  Administered 2011-07-06: 1000 mg via INTRAVENOUS
  Filled 2011-07-06: qty 20

## 2011-07-06 MED ORDER — ENSURE COMPLETE PO LIQD
237.0000 mL | Freq: Two times a day (BID) | ORAL | Status: DC
  Administered 2011-07-07 (×2): 237 mL via ORAL

## 2011-07-06 MED ORDER — VITAMIN B-1 100 MG PO TABS
100.0000 mg | ORAL_TABLET | Freq: Every day | ORAL | Status: DC
  Administered 2011-07-06 – 2011-07-07 (×2): 100 mg via ORAL
  Filled 2011-07-06 (×2): qty 1

## 2011-07-06 MED ORDER — ONDANSETRON HCL 4 MG/2ML IJ SOLN: 4.0000 mg | Freq: Four times a day (QID) | INTRAMUSCULAR | Status: DC | PRN

## 2011-07-06 MED ORDER — ENOXAPARIN SODIUM 40 MG/0.4ML ~~LOC~~ SOLN
40.0000 mg | SUBCUTANEOUS | Status: DC
  Administered 2011-07-06 – 2011-07-07 (×2): 40 mg via SUBCUTANEOUS
  Filled 2011-07-06 (×3): qty 0.4

## 2011-07-06 MED ORDER — ADULT MULTIVITAMIN W/MINERALS CH
1.0000 | ORAL_TABLET | Freq: Every day | ORAL | Status: DC
  Administered 2011-07-07: 1 via ORAL
  Filled 2011-07-06 (×2): qty 1

## 2011-07-06 MED ORDER — NICOTINE 14 MG/24HR TD PT24
14.0000 mg | MEDICATED_PATCH | Freq: Every day | TRANSDERMAL | Status: DC
  Administered 2011-07-06 – 2011-07-07 (×2): 14 mg via TRANSDERMAL
  Filled 2011-07-06 (×2): qty 1

## 2011-07-06 MED ORDER — HALOPERIDOL LACTATE 5 MG/ML IJ SOLN
5.0000 mg | Freq: Once | INTRAMUSCULAR | Status: AC
  Administered 2011-07-06: 5 mg via INTRAVENOUS

## 2011-07-06 MED ORDER — LISINOPRIL 10 MG PO TABS
10.0000 mg | ORAL_TABLET | Freq: Every day | ORAL | Status: DC
  Administered 2011-07-06 – 2011-07-07 (×2): 10 mg via ORAL
  Filled 2011-07-06 (×2): qty 1

## 2011-07-06 MED ORDER — SERTRALINE HCL 100 MG PO TABS
100.0000 mg | ORAL_TABLET | Freq: Every day | ORAL | Status: DC
  Administered 2011-07-06 – 2011-07-07 (×2): 100 mg via ORAL
  Filled 2011-07-06 (×2): qty 1

## 2011-07-06 MED ORDER — LORAZEPAM 1 MG PO TABS: 0.0000 mg | ORAL_TABLET | Freq: Two times a day (BID) | ORAL | Status: DC

## 2011-07-06 MED ORDER — POTASSIUM CHLORIDE IN NACL 20-0.9 MEQ/L-% IV SOLN
INTRAVENOUS | Status: DC
  Administered 2011-07-06 – 2011-07-07 (×3): via INTRAVENOUS
  Filled 2011-07-06 (×4): qty 1000

## 2011-07-06 MED ORDER — ACETAMINOPHEN 650 MG RE SUPP: 650.0000 mg | Freq: Four times a day (QID) | RECTAL | Status: DC | PRN

## 2011-07-06 MED ORDER — ONDANSETRON HCL 4 MG PO TABS: 4.0000 mg | ORAL_TABLET | Freq: Four times a day (QID) | ORAL | Status: DC | PRN

## 2011-07-06 MED ORDER — METOPROLOL TARTRATE 1 MG/ML IV SOLN: 5.0000 mg | INTRAVENOUS | Status: DC | PRN

## 2011-07-06 MED ORDER — LORAZEPAM 2 MG/ML IJ SOLN
2.0000 mg | INTRAMUSCULAR | Status: DC | PRN
  Administered 2011-07-06: 2 mg via INTRAVENOUS
  Filled 2011-07-06 (×2): qty 1

## 2011-07-06 MED ORDER — PHENYTOIN SODIUM EXTENDED 100 MG PO CAPS
100.0000 mg | ORAL_CAPSULE | Freq: Three times a day (TID) | ORAL | Status: DC
  Administered 2011-07-06 – 2011-07-07 (×5): 100 mg via ORAL
  Filled 2011-07-06 (×6): qty 1

## 2011-07-06 MED ORDER — LORAZEPAM 1 MG PO TABS
1.0000 mg | ORAL_TABLET | Freq: Four times a day (QID) | ORAL | Status: DC | PRN
  Administered 2011-07-07: 1 mg via ORAL
  Filled 2011-07-06 (×3): qty 1

## 2011-07-06 MED ORDER — MAGNESIUM OXIDE 400 (241.3 MG) MG PO TABS
400.0000 mg | ORAL_TABLET | Freq: Two times a day (BID) | ORAL | Status: DC
  Administered 2011-07-06 – 2011-07-07 (×3): 400 mg via ORAL
  Filled 2011-07-06 (×4): qty 1

## 2011-07-06 MED ORDER — LORAZEPAM 2 MG/ML IJ SOLN: 2.0000 mg | Freq: Once | INTRAMUSCULAR | Status: DC

## 2011-07-06 MED ORDER — ACETAMINOPHEN 325 MG PO TABS: 650.0000 mg | ORAL_TABLET | Freq: Four times a day (QID) | ORAL | Status: DC | PRN

## 2011-07-06 MED ORDER — FOLIC ACID 1 MG PO TABS
1.0000 mg | ORAL_TABLET | Freq: Every day | ORAL | Status: DC
  Administered 2011-07-06 – 2011-07-07 (×2): 1 mg via ORAL
  Filled 2011-07-06 (×2): qty 1

## 2011-07-06 MED ORDER — AMLODIPINE BESYLATE 5 MG PO TABS
5.0000 mg | ORAL_TABLET | Freq: Every day | ORAL | Status: DC
  Administered 2011-07-06 – 2011-07-07 (×2): 5 mg via ORAL
  Filled 2011-07-06 (×2): qty 1

## 2011-07-06 MED ORDER — LORAZEPAM 2 MG/ML IJ SOLN
1.0000 mg | Freq: Four times a day (QID) | INTRAMUSCULAR | Status: DC | PRN
  Administered 2011-07-06: 1 mg via INTRAVENOUS
  Filled 2011-07-06: qty 1
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
## 2011-07-06 NOTE — Progress Notes (Signed)
eLink Physician-Brief Progress Note Patient Name: Berel Najjar DOB: [birth year] MRN: [id number]  Date of Service  07/06/2011   HPI/Events of Note  Call from nurse reporting agitation in patient on CIWA protocol for ETOH withdrawal.  Patient remains restless and agitated despite dosing with ativan.  Nurse is concerned the the ativan may be causing more agitation.  QTc is less than 500 on EKG  eICU Interventions  Plan: One time dose of haldol 5 mg to see how patient tolerates.   Intervention Category Intermediate Interventions:  (agitation)  Mckenzie Bove 07/06/2011, 6:35 AM

## 2011-07-06 NOTE — Progress Notes (Signed)
   CARE MANAGEMENT NOTE 07/06/2011  Patient:  Miguel Hanson, Miguel Hanson   Account Number:  [id number]  Date Initiated:  07/06/2011  Documentation initiated by:  Jiles Crocker  Subjective/Objective Assessment:   ADMITTED WITH SEIZURES     Action/Plan:   LIVES AT HOME WITH SPOUSE   Anticipated DC Date:  07/13/2011   Anticipated DC Plan:  HOME/SELF CARE          Status of service:  In process, will continue to follow Medicare Important Message given?   (If response is "NO", the following Medicare IM given date fields will be blank)  Discharge Disposition:  HOME  Per UR Regulation:  Reviewed for med. necessity/level of care/duration of stay  Comments:  07/06/2011- B Jasminne Mealy RN, BSN, MHA

## 2011-07-06 NOTE — Clinical Documentation Improvement (Signed)
MALNUTRITION DOCUMENTATION CLARIFICATION  THIS DOCUMENT IS NOT A PERMANENT PART OF THE MEDICAL RECORD  TO RESPOND TO THE THIS QUERY, FOLLOW THE INSTRUCTIONS BELOW:  1. If needed, update documentation for the patient's encounter via the notes activity.  2. Access this query again and click edit on the In [employer].  3. After updating, or not, click F2 to complete all highlighted (required) fields concerning your review. Select "additional documentation in the medical record" OR "no additional documentation provided".  4. Click Sign note button.  5. The deficiency will fall out of your In Basket *Please let us know if you are not able to complete this workflow by phone or e-mail (listed below).  Please update your documentation within the medical record to reflect your response to this query.                                                                                        07/09/11   Dear Dr. Elisabeth [facility],  In a better effort to capture your patient's severity of illness, reflect appropriate length of stay and utilization of resources, a review of the patient medical record has revealed the following indicators.    Based on your clinical judgment, please clarify and document in a progress note and/or discharge summary the clinical condition associated with the following supporting information:  In responding to this query please exercise your independent judgment.  The fact that a query is asked, does not imply that any particular answer is desired or expected.  Pt admitted with Seizures.  According to the ROS on HP pt cachetic    Please clarify if cachetic can be further specified as one of the diagnosis listed below in setting of BMI=18.4 and  documented ETOH abuse and document in pn and d/c summary  Thank you for all that you do for our patients!    Possible Clinical Conditions?   _______Severe Malnutrition   _______Protein Calorie  Malnutrition _______Severe Protein Calorie Malnutrition  _______Other Condition________________ _______Cannot clinically determine     Supporting Information: Risk Factors: Seizure, ETOH, PTSD, cachetic per ROS,   Nutrition Note: 07/10/11 DOCUMENTATION CODES  Per approved criteria   -Severe malnutrition in the context of social or environmental circumstances  -Underweight   Jobe, Anastasia Fiedler  07/10/11, 5:06 PM   Signs & Symptoms: HP General:  Alert and oriented, cachectic, no acute distress   BMI=18.4 5'6'/113lbs    Treatment  Monitoring mulitivitamin with minerals tablet 1 tablet  thiamine (VITAMIN B-1) tablet 100 mg  folic acid (FOLVITE) tablet 1 mg      Low sodium heart diet  You may use possible, probable, or suspect with inpatient documentation. possible, probable, suspected diagnoses MUST be documented at the time of discharge  Reviewed: additional documentation provided in d/c summary 07/07/2011.  Thank You,  Enis Slipper  RN, BSN, CCDS Clinical Documentation Specialist [facility] Dept Pager: [phone] / E-mail: [email or web address]  Health Information Management [place]

## 2011-07-06 NOTE — Progress Notes (Signed)
INITIAL ADULT NUTRITION ASSESSMENT Date: 07/06/2011   Time: 5:06 PM Reason for Assessment: Nutrition Risk- dysphagia and appears severely malnourished  ASSESSMENT: Male 66 y.o.  Dx: Seizure, ETOH abuse  Hx:  Past Medical History  Diagnosis Date  . Hypertension   . Seizures   . ETOH abuse   . Drug abuse   . PTSD (post-traumatic stress disorder)   . H/O prolonged Q-T interval on ECG   . Polysubstance abuse   . Alcohol abuse     Related Meds:  Scheduled Meds:   . sodium chloride   Intravenous Once  . amLODipine  5 mg Oral Daily  . enoxaparin  40 mg Subcutaneous Q24H  . folic acid  1 mg Oral Daily  . haloperidol lactate      . haloperidol lactate  5 mg Intravenous Once  . lisinopril  10 mg Oral Daily  . LORazepam      . LORazepam  2 mg Intravenous Once  . LORazepam  2 mg Intravenous Once  . LORazepam  0-4 mg Oral Q6H   Followed by  . LORazepam  0-4 mg Oral Q12H  . magnesium oxide  400 mg Oral BID  . mulitivitamin with minerals  1 tablet Oral Daily  . nicotine  14 mg Transdermal Daily  . ondansetron      . ondansetron (ZOFRAN) IV  4 mg Intravenous Once  . phenytoin (DILANTIN) IV  1,000 mg Intravenous Once  . phenytoin  100 mg Oral TID  . sertraline  100 mg Oral Daily  . thiamine  100 mg Oral Daily   Continuous Infusions:   . 0.9 % NaCl with KCl 20 mEq / L 75 mL/hr at 07/06/11 1000   PRN Meds:.acetaminophen, acetaminophen, LORazepam, LORazepam, LORazepam, metoprolol, ondansetron (ZOFRAN) IV, ondansetron   Ht: 5\' 6"  (167.6 cm)  Wt: 113 lb 8.6 oz (51.5 kg)  Ideal Wt: 64.5kg % Ideal Wt: 80  Usual Wt:  Wt Readings from Last 15 Encounters:  07/06/11 113 lb 8.6 oz (51.5 kg)  03/08/11 128 lb 1.4 oz (58.1 kg)   % Usual Wt: 89  Body mass index is 18.33 kg/(m^2).  Food/Nutrition Related Hx: Sleeping, unable to awaken to obtain  Labs:  CMP     Component Value Date/Time   NA 129* 07/06/2011 0330   K 3.7 07/06/2011 0330   CL 91* 07/06/2011 0330   CO2 25 07/06/2011  0330   GLUCOSE 105* 07/06/2011 0330   BUN 3* 07/06/2011 0330   CREATININE 0.64 07/06/2011 0330   CALCIUM 8.9 07/06/2011 0330   PROT 7.7 07/05/2011 1854   ALBUMIN 4.2 07/05/2011 1854   AST 81* 07/05/2011 1854   ALT 21 07/05/2011 1854   ALKPHOS 74 07/05/2011 1854   BILITOT 0.9 07/05/2011 1854   GFRNONAA >90 07/06/2011 0330   GFRAA >90 07/06/2011 0330    I/O last 3 completed shifts: In: -  Out: 300 [Urine:300] Total I/O In: 0  Out: 645 [Urine:645]   Diet Order: Heart Healthy   Supplements/Tube Feeding:none  IVF:    0.9 % NaCl with KCl 20 mEq / L Last Rate: 75 mL/hr at 07/06/11 1000    Estimated Nutritional Needs:per day   Kcal: [id number] Protein: 6575 Fluid: >1.5L  Pt sleeping. Fetal position.  Very thin appearing with severe depletion of muscle mass and body fat.  Pt with ETOH abuse.  Expect nutritional intake inadequate.  Pt meets criteria for Severe Malnutrition (social-etoh abuse) and underweight. Lunch untouched.  Unable to arouse.  NUTRITION DIAGNOSIS: -Inadequate oral intake (NI-2.1).  Status: Ongoing  RELATED TO: ETOH abuse  AS EVIDENCE BY: observed poor intake  MONITORING/EVALUATION(Goals): Monitor:  Intake, labs, weight Goal:  Meet>90% estimated needs with meals and supplements to prevent weight loss.  EDUCATION NEEDS: -No education needs identified at this time  INTERVENTION: Ensure Clinical Strength bid Consider changing diet to Regular  Dietitian [phone]  DOCUMENTATION CODES Per approved criteria  -Severe  malnutrition in the context of social or environmental circumstances -Underweight    Ardis Fullwood, Anastasia Fiedler 07/06/2011, 5:06 PM

## 2011-07-07 DIAGNOSIS — F4310 Post-traumatic stress disorder, unspecified: Secondary | ICD-10-CM

## 2011-07-07 DIAGNOSIS — F1010 Alcohol abuse, uncomplicated: Secondary | ICD-10-CM

## 2011-07-07 DIAGNOSIS — I10 Essential (primary) hypertension: Secondary | ICD-10-CM

## 2011-07-07 DIAGNOSIS — G40309 Generalized idiopathic epilepsy and epileptic syndromes, not intractable, without status epilepticus: Secondary | ICD-10-CM

## 2011-07-07 MED ORDER — PHENYTOIN SODIUM EXTENDED 100 MG PO CAPS: 100.0000 mg | ORAL_CAPSULE | Freq: Three times a day (TID) | ORAL | Status: DC

## 2011-07-07 MED ORDER — FOLIC ACID 1 MG PO TABS: 1.0000 mg | ORAL_TABLET | Freq: Every day | ORAL | Status: AC

## 2011-07-07 MED ORDER — AMLODIPINE BESYLATE 5 MG PO TABS: 5.0000 mg | ORAL_TABLET | Freq: Every day | ORAL | Status: DC

## 2011-07-07 MED ORDER — THIAMINE HCL 100 MG PO TABS: 100.0000 mg | ORAL_TABLET | Freq: Every day | ORAL | Status: DC

## 2011-07-07 MED ORDER — LISINOPRIL 20 MG PO TABS: 10.0000 mg | ORAL_TABLET | Freq: Every day | ORAL | Status: DC

## 2011-07-07 MED ORDER — MAGNESIUM OXIDE 400 MG PO TABS: 400.0000 mg | ORAL_TABLET | Freq: Two times a day (BID) | ORAL | Status: DC

## 2011-07-07 MED ORDER — SERTRALINE HCL 100 MG PO TABS: 100.0000 mg | ORAL_TABLET | Freq: Every day | ORAL | Status: DC

## 2011-07-07 MED ORDER — ADULT MULTIVITAMIN W/MINERALS CH: 1.0000 | ORAL_TABLET | Freq: Every day | ORAL | Status: DC

## 2011-07-07 NOTE — Discharge Instructions (Signed)
Epilepsy  A seizure (convulsion) is a sudden change in brain function that causes a change in behavior, muscle activity, or ability to remain awake and alert. If a person has recurring seizures, this is called epilepsy.  CAUSES   Epilepsy is a disorder with many possible causes. Anything that disturbs the normal pattern of brain cell activity can lead to seizures. Seizure can be caused from illness to brain damage to abnormal brain development. Epilepsy may develop because of:   An abnormality in brain wiring.   An imbalance of nerve signaling chemicals (neurotransmitters).   Some combination of these factors.  Scientists are learning an increasing amount about genetic causes of seizures.  SYMPTOMS   The symptoms of a seizure can vary greatly from one person to another. These may include:   An aura, or warning that tells a person they are about to have a seizure.   Abnormal sensations, such as abnormal smell or seeing flashing lights.   Sudden, general body stiffness.   Rhythmic jerking of the face, arm, or leg - on one or both sides.   Sudden change in consciousness.   The person may appear to be awake but not responding.   They may appear to be asleep but cannot be awakened.   Grimacing, chewing, lip smacking, or drooling.   Often there is a period of sleepiness after a seizure.  DIAGNOSIS   The description you give to your caregiver about what you experienced will help them understand your problems. Equally important is the description by any witnesses to your seizure. A physical exam, including a detailed neurological exam, is necessary. An EEG (electroencephalogram) is a painless test of your brain waves. In this test a diagram is created of your brain waves. These diagrams can be interpreted by a specialist. Pictures of your brain are usually taken with:   An MRI.   A CT scan.  Lab tests may be done to look for:   Signs of infection.   Abnormal blood chemistry.  PREVENTION   There is no way to  prevent the development of epilepsy. If you have seizures that are typically triggered by an event (such as flashing lights), try to avoid the trigger. This can help you avoid a seizure.   PROGNOSIS   Most people with epilepsy lead outwardly normal lives. While epilepsy cannot currently be cured, for some people it does eventually go away. Most seizures do not cause brain damage. It is not uncommon for people with epilepsy, especially children, to develop behavioral and emotional problems. These problems are sometimes the consequence of medicine for seizures or social stress. For some people with epilepsy, the risk of seizures restricts their independence and recreational activities. For example, some states refuse drivers licenses to people with epilepsy.  Most women with epilepsy can become pregnant. They should discuss their epilepsy and the medicine they are taking with their caregivers. Women with epilepsy have a 90 percent or better chance of having a normal, healthy baby.  RISKS AND COMPLICATIONS   People with epilepsy are at increased risk of falls, accidents, and injuries. People with epilepsy are at special risk for two life-threatening conditions. These are status epilepticus and sudden unexplained death (extremely rare). Status epilepticus is a long lasting, continuous seizure that is a medical emergency.  TREATMENT   Once epilepsy is diagnosed, it is important to begin treatment as soon as possible. For about 80 percent of those diagnosed with epilepsy, seizures can be controlled with modern medicines   and surgical techniques. Some antiepileptic drugs can interfere with the effectiveness of oral contraceptives. In 1997, the FDA approved a pacemaker for the brain the (vagus nerve stimulator). This stimulator can be used for people with seizures that are not well-controlled by medicine. Studies have shown that in some cases, children may experience fewer seizures if they maintain a strict diet. The strict  diet is called the ketogenic diet. This diet is rich in fats and low in carbohydrates.  HOME CARE INSTRUCTIONS    Your caregiver will make recommendations about driving and safety in normal activities. Follow these carefully.   Take any medicine prescribed exactly as directed.   Do any blood tests requested to monitor the levels of your medicine.   The people you live and work with should know that you are prone to seizures. They should receive instructions on how to help you. In general, a witness to a seizure should:   Cushion your head and body.   Turn you on your side.   Avoid unnecessarily restraining you.   Not place anything inside your mouth.   Call for local emergency medical help if there is any question about what has occurred.   Keep a seizure diary. Record what you recall about any seizure, especially any possible trigger.   If your caregiver has given you a follow-up appointment, it is very important to keep that appointment. Not keeping the appointment could result in permanent injury and disability. If there is any problem keeping the appointment, you must call back to this facility for assistance.  SEEK MEDICAL CARE IF:    You develop signs of infection or other illness. This might increase the risk of a seizure.   You seem to be having more frequent seizures.   Your seizure pattern is changing.  SEEK IMMEDIATE MEDICAL CARE IF:    A seizure does not stop after a few moments.   A seizure causes any difficulty in breathing.   A seizure results in a very severe headache.   A seizure leaves you with the inability to speak or use a part of your body.  MAKE SURE YOU:    Understand these instructions.   Will watch your condition.   Will get help right away if you are not doing well or get worse.  Document Released: 02/16/2005 Document Revised: 02/05/2011 Document Reviewed: 09/23/2007  [employer] Patient Information 2012 [employer].

## 2011-07-07 NOTE — Discharge Summary (Addendum)
Patient ID: Miguel Hanson MRN: [id number] DOB/AGE: 1945-06-24 66 y.o.  Admit date: 07/05/2011 Discharge date: 07/07/2011  Primary Care Physician:  Provider Not In System  Assessment and Plan:   Principal Problem:   *Seizure disorder  - likely secondary to combination of non compliance and alcohol abuse  - patient was loaded with dilantin on admission and now on 3 x daily regimen  - no further seizure episodes witnessed by staff - patient is medically stable to go home today; he has declined physical therapy offers and reports he is very non compliant with medications as he is drinking. I have offered to patient to perhaps follow up with outpatient rehab but he has declined that offer as well. At this time he feels comfortable leaving home and reports he lives with is wife and she helps him around. I have advised him to stop drinking but he said he will think about it. I have provided all prescriptions on discharge and have encouraged him to be compliant although I am not confident that he will be. I have also mentioned to him to follow up with PCP for re evaluation of cervical lymph nodes found on CT cervical spine and he verbalized understanding and reported he will make an appointment.  Active Problems:   Alcohol withdrawal  - CIWA protocol initiated  - no reports of seizures or withdrawal per RN  - we will continue thiamine and MVI on discharge  Cachexia - BMI 18.4 - secondary to poor nutrition secondary to alcohol abuse  Protein calorie malnutrition - secondary to history of alcohol abuse, poor oral intake/progressive failure to thrive - nutritional counseling provided by me, encouraged PO intake  HTN  - BP at goal - continue current regimen with lisinopril and norvasc   Hyponatremia  - perhaps secondary to alcohol abuse  - patient refused morning lab draws  Disposition  - medically stable for discahrge   Medication List  As of 07/07/2011  2:29 PM   TAKE these  medications         amLODipine 5 MG tablet   Commonly known as: NORVASC   Take 1 tablet (5 mg total) by mouth daily.      folic acid 1 MG tablet   Commonly known as: FOLVITE   Take 1 tablet (1 mg total) by mouth daily.      lisinopril 20 MG tablet   Commonly known as: PRINIVIL,ZESTRIL   Take 0.5 tablets (10 mg total) by mouth daily.      magnesium oxide 400 MG tablet   Commonly known as: MAG-OX   Take 1 tablet (400 mg total) by mouth 2 (two) times daily.      mulitivitamin with minerals Tabs   Take 1 tablet by mouth daily.      phenytoin 100 MG ER capsule   Commonly known as: DILANTIN   Take 1 capsule (100 mg total) by mouth 3 (three) times daily.      sertraline 100 MG tablet   Commonly known as: ZOLOFT   Take 1 tablet (100 mg total) by mouth daily.      thiamine 100 MG tablet   Take 1 tablet (100 mg total) by mouth daily.            Significant Diagnostic Studies:  Ct Head Wo Contrast 07/05/2011   IMPRESSION:  1.  No acute intracranial findings.  Stable atrophy and white matter disease. 2.  No acute skull fracture. 3.  Remote nasal bone fractures.  CT  CERVICAL SPINE  Findings: The sagittal reformatted images demonstrate normal alignment of the cervical vertebral bodies.  Disc spaces and vertebral bodies are maintained.  No acute bony findings or abnormal prevertebral soft tissue swelling.  The facets are normally aligned.  No facet or laminar fractures are seen. No large disc protrusions.  The neural foramen are patent.  The skull base C1 and C1-C2 articulations are maintained.  The dens is normal.  There are scattered cervical lymph nodes.  The lung apices are clear.  IMPRESSION:  Normal alignment and no acute bony findings.  No change since prior study.    Ct Cervical Spine Wo Contrast 07/05/2011   IMPRESSION:  1.  No acute intracranial findings.  Stable atrophy and white matter disease. 2.  No acute skull fracture. 3.  Remote nasal bone fractures.  CT CERVICAL SPINE   Findings: The sagittal reformatted images demonstrate normal alignment of the cervical vertebral bodies.  Disc spaces and vertebral bodies are maintained.  No acute bony findings or abnormal prevertebral soft tissue swelling.  The facets are normally aligned.  No facet or laminar fractures are seen. No large disc protrusions.  The neural foramen are patent.  The skull base C1 and C1-C2 articulations are maintained.  The dens is normal.  There are scattered cervical lymph nodes.  The lung apices are clear.  IMPRESSION:  Normal alignment and no acute bony findings.  No change since prior study.   Brief H and P: 66 year old male that was brought in because of seizures. Patient had the grand mal seizure witnessed in the ER and was given Ativan. Patient was observed in ICU for 24 hours and then transferred to medical floor for further observation. Patient was also loaded with dilantin, no further seizures noted since admission.  Physical Exam on Discharge:  Filed Vitals:   07/06/11 2102 07/07/11 0500 07/07/11 0530 07/07/11 1408  BP: 107/68  117/71 128/75  Pulse: 102 94 86 98  Temp: 98.3 F (36.8 C)  97.8 F (36.6 C) 98.2 F (36.8 C)  TempSrc: Oral  Oral Oral  Resp: 18  18 18   Height:      Weight:      SpO2: 98%  95% 97%     Intake/Output Summary (Last 24 hours) at 07/07/11 1429 Last data filed at 07/07/11 1300  Gross per 24 hour  Intake   1655 ml  Output    125 ml  Net   1530 ml    General: Alert, awake, oriented x3, in no acute distress. HEENT: No bruits, no goiter. Heart: Regular rate and rhythm, without murmurs, rubs, gallops. Lungs: Clear to auscultation bilaterally. Abdomen: Soft, nontender, nondistended, positive bowel sounds. Extremities: No clubbing cyanosis or edema with positive pedal pulses. Neuro: Grossly intact, nonfocal.  CBC:    Component Value Date/Time   WBC 7.8 07/06/2011 0330   HGB 12.9* 07/06/2011 0330   HCT 35.3* 07/06/2011 0330   PLT 109* 07/06/2011 0330   MCV  102.9* 07/06/2011 0330   NEUTROABS 1.8 07/05/2011 1854   LYMPHSABS 1.2 07/05/2011 1854   MONOABS 0.3 07/05/2011 1854   EOSABS 0.2 07/05/2011 1854   BASOSABS 0.0 07/05/2011 1854    Basic Metabolic Panel:    Component Value Date/Time   NA 129* 07/06/2011 0330   K 3.7 07/06/2011 0330   CL 91* 07/06/2011 0330   CO2 25 07/06/2011 0330   BUN 3* 07/06/2011 0330   CREATININE 0.64 07/06/2011 0330   GLUCOSE 105* 07/06/2011 0330   CALCIUM  8.9 07/06/2011 0330   Time spent on Discharge: Greater than 45 minutes  Signed: Ivylynn Hoppes 07/07/2011, 2:29 PM

## 2011-08-10 ENCOUNTER — Encounter (HOSPITAL_COMMUNITY): Payer: Self-pay

## 2011-08-10 ENCOUNTER — Inpatient Hospital Stay (HOSPITAL_COMMUNITY)
Admission: EM | Admit: 2011-08-10 | Discharge: 2011-08-13 | DRG: 896 | Disposition: A | Discharge status: Home Health | Payer: Medicare Other | Attending: Internal Medicine | Admitting: Internal Medicine

## 2011-08-10 DIAGNOSIS — R1110 Vomiting, unspecified: Secondary | ICD-10-CM

## 2011-08-10 DIAGNOSIS — F4310 Post-traumatic stress disorder, unspecified: Secondary | ICD-10-CM | POA: Diagnosis present

## 2011-08-10 DIAGNOSIS — F1020 Alcohol dependence, uncomplicated: Secondary | ICD-10-CM | POA: Diagnosis present

## 2011-08-10 DIAGNOSIS — I10 Essential (primary) hypertension: Secondary | ICD-10-CM | POA: Diagnosis present

## 2011-08-10 DIAGNOSIS — R4182 Altered mental status, unspecified: Secondary | ICD-10-CM | POA: Diagnosis present

## 2011-08-10 DIAGNOSIS — E871 Hypo-osmolality and hyponatremia: Secondary | ICD-10-CM | POA: Diagnosis present

## 2011-08-10 DIAGNOSIS — G9341 Metabolic encephalopathy: Secondary | ICD-10-CM | POA: Diagnosis present

## 2011-08-10 DIAGNOSIS — R569 Unspecified convulsions: Secondary | ICD-10-CM

## 2011-08-10 DIAGNOSIS — G40909 Epilepsy, unspecified, not intractable, without status epilepticus: Secondary | ICD-10-CM | POA: Diagnosis present

## 2011-08-10 DIAGNOSIS — F10939 Alcohol use, unspecified with withdrawal, unspecified: Principal | ICD-10-CM | POA: Diagnosis present

## 2011-08-10 DIAGNOSIS — R270 Ataxia, unspecified: Secondary | ICD-10-CM

## 2011-08-10 DIAGNOSIS — Z79899 Other long term (current) drug therapy: Secondary | ICD-10-CM

## 2011-08-10 DIAGNOSIS — F10239 Alcohol dependence with withdrawal, unspecified: Principal | ICD-10-CM | POA: Diagnosis present

## 2011-08-10 DIAGNOSIS — F1010 Alcohol abuse, uncomplicated: Secondary | ICD-10-CM

## 2011-08-10 NOTE — ED Notes (Signed)
AVW:[id number]<XB> Expected date:08/10/11<BR> Expected time:<BR> Means of arrival:<BR> Comments:<BR> EMS 261 GC - seizure/hx of same

## 2011-08-10 NOTE — ED Notes (Addendum)
Per EMS, pt has hx of seizures.  Per wife, pt has not had his seizure meds in one month.  Known ETOH and drank heavily yesterday.  Wife found pt seizing on bed at around 2245 covered in vomit and urine.  Postictal on arrival to home.  Pt responding to verbal stimuli.  Diaphoretic and cool.  Vitals:  164/118, hr 124 and bounding, resp16 and regular, spo2 96% ra.  IV LAC, 18 gage.  Hx No other noted.

## 2011-08-11 ENCOUNTER — Emergency Department (HOSPITAL_COMMUNITY): Payer: Medicare Other

## 2011-08-11 ENCOUNTER — Encounter (HOSPITAL_COMMUNITY): Payer: Self-pay

## 2011-08-11 DIAGNOSIS — R569 Unspecified convulsions: Secondary | ICD-10-CM

## 2011-08-11 DIAGNOSIS — I10 Essential (primary) hypertension: Secondary | ICD-10-CM

## 2011-08-11 DIAGNOSIS — R4182 Altered mental status, unspecified: Secondary | ICD-10-CM | POA: Diagnosis present

## 2011-08-11 DIAGNOSIS — F10239 Alcohol dependence with withdrawal, unspecified: Secondary | ICD-10-CM

## 2011-08-11 DIAGNOSIS — F329 Major depressive disorder, single episode, unspecified: Secondary | ICD-10-CM

## 2011-08-11 LAB — COMPREHENSIVE METABOLIC PANEL
AST: 46 U/L — ABNORMAL HIGH (ref 0–37)
Albumin: 3.7 g/dL (ref 3.5–5.2)
CO2: 22 mEq/L (ref 19–32)
Calcium: 8.8 mg/dL (ref 8.4–10.5)
Creatinine, Ser: 0.59 mg/dL (ref 0.50–1.35)
GFR calc non Af Amer: >90 mL/min (ref >90)

## 2011-08-11 LAB — CBC
HCT: 33.3 % — ABNORMAL LOW (ref 39.0–52.0)
MCHC: 37.3 g/dL — ABNORMAL HIGH (ref 30.0–36.0)
MCV: 103.4 fL — ABNORMAL HIGH (ref 78.0–100.0)
RDW: 17.4 % — ABNORMAL HIGH (ref 11.5–15.5)

## 2011-08-11 LAB — URINALYSIS, ROUTINE W REFLEX MICROSCOPIC
Glucose, UA: NEGATIVE mg/dL
Hgb urine dipstick: NEGATIVE
Protein, ur: 30 mg/dL — AB
pH: 7 (ref 5.0–8.0)

## 2011-08-11 LAB — RAPID URINE DRUG SCREEN, HOSP PERFORMED
Amphetamines: NOT DETECTED
Opiates: NOT DETECTED

## 2011-08-11 LAB — HEPATIC FUNCTION PANEL
Albumin: 4.1 g/dL (ref 3.5–5.2)
Bilirubin, Direct: 0.1 mg/dL (ref 0.0–0.3)
Indirect Bilirubin: 0.5 mg/dL (ref 0.3–0.9)
Total Bilirubin: 0.6 mg/dL (ref 0.3–1.2)

## 2011-08-11 LAB — DIFFERENTIAL
Basophils Absolute: 0 10*3/uL (ref 0.0–0.1)
Basophils Relative: 1 % (ref 0–1)
Eosinophils Relative: 5 % (ref 0–5)
Monocytes Absolute: 0.3 10*3/uL (ref 0.1–1.0)

## 2011-08-11 LAB — LIPASE, BLOOD: Lipase: 36 U/L (ref 11–59)

## 2011-08-11 LAB — GLUCOSE, CAPILLARY: Glucose-Capillary: 106 mg/dL — ABNORMAL HIGH (ref 70–99)

## 2011-08-11 LAB — URINE MICROSCOPIC-ADD ON

## 2011-08-11 LAB — ETHANOL: Alcohol, Ethyl (B): <11 mg/dL (ref 0–11)

## 2011-08-11 LAB — PHENYTOIN LEVEL, TOTAL: Phenytoin Lvl: <2.5 ug/mL — ABNORMAL LOW (ref 10.0–20.0)

## 2011-08-11 MED ORDER — LORAZEPAM 2 MG/ML IJ SOLN: 0.0000 mg | Freq: Two times a day (BID) | INTRAMUSCULAR | Status: DC

## 2011-08-11 MED ORDER — THIAMINE HCL 100 MG/ML IJ SOLN
100.0000 mg | Freq: Every day | INTRAMUSCULAR | Status: DC
  Filled 2011-08-11 (×3): qty 1

## 2011-08-11 MED ORDER — ADULT MULTIVITAMIN W/MINERALS CH: 1.0000 | ORAL_TABLET | Freq: Every day | ORAL | Status: DC

## 2011-08-11 MED ORDER — VITAMIN B-1 100 MG PO TABS: 100.0000 mg | ORAL_TABLET | Freq: Every day | ORAL | Status: DC

## 2011-08-11 MED ORDER — LISINOPRIL 20 MG PO TABS: 20.0000 mg | ORAL_TABLET | Freq: Every day | ORAL | Status: DC

## 2011-08-11 MED ORDER — SODIUM CHLORIDE 0.9 % IV SOLN
1000.0000 mg | Freq: Once | INTRAVENOUS | Status: AC
  Administered 2011-08-11: 1000 mg via INTRAVENOUS
  Filled 2011-08-11: qty 20

## 2011-08-11 MED ORDER — FOLIC ACID 1 MG PO TABS: 1.0000 mg | ORAL_TABLET | Freq: Every day | ORAL | Status: DC

## 2011-08-11 MED ORDER — PHENYTOIN SODIUM EXTENDED 100 MG PO CAPS: 100.0000 mg | ORAL_CAPSULE | Freq: Three times a day (TID) | ORAL | Status: DC

## 2011-08-11 MED ORDER — ENOXAPARIN SODIUM 40 MG/0.4ML ~~LOC~~ SOLN
40.0000 mg | SUBCUTANEOUS | Status: DC
  Administered 2011-08-11 – 2011-08-12 (×2): 40 mg via SUBCUTANEOUS
  Filled 2011-08-11 (×3): qty 0.4

## 2011-08-11 MED ORDER — LORAZEPAM 1 MG PO TABS
1.0000 mg | ORAL_TABLET | Freq: Four times a day (QID) | ORAL | Status: DC | PRN
  Administered 2011-08-11: 1 mg via ORAL
  Filled 2011-08-11: qty 1

## 2011-08-11 MED ORDER — VITAMIN B-1 100 MG PO TABS
100.0000 mg | ORAL_TABLET | Freq: Every day | ORAL | Status: DC
  Administered 2011-08-11 – 2011-08-13 (×3): 100 mg via ORAL
  Filled 2011-08-11 (×3): qty 1

## 2011-08-11 MED ORDER — AMLODIPINE BESYLATE 5 MG PO TABS
5.0000 mg | ORAL_TABLET | Freq: Every day | ORAL | Status: DC
  Administered 2011-08-11 – 2011-08-13 (×3): 5 mg via ORAL
  Filled 2011-08-11 (×3): qty 1

## 2011-08-11 MED ORDER — LORAZEPAM 2 MG/ML IJ SOLN
1.0000 mg | Freq: Four times a day (QID) | INTRAMUSCULAR | Status: DC | PRN
  Administered 2011-08-11: 1 mg via INTRAVENOUS
  Filled 2011-08-11 (×3): qty 1

## 2011-08-11 MED ORDER — LORAZEPAM 2 MG/ML IJ SOLN
0.0000 mg | Freq: Four times a day (QID) | INTRAMUSCULAR | Status: DC
  Administered 2011-08-11: 2 mg via INTRAVENOUS

## 2011-08-11 MED ORDER — HYDRALAZINE HCL 20 MG/ML IJ SOLN: 10.0000 mg | Freq: Four times a day (QID) | INTRAMUSCULAR | Status: DC | PRN

## 2011-08-11 MED ORDER — LORAZEPAM 2 MG/ML IJ SOLN: 1.0000 mg | Freq: Four times a day (QID) | INTRAMUSCULAR | Status: DC | PRN

## 2011-08-11 MED ORDER — FOLIC ACID 1 MG PO TABS
1.0000 mg | ORAL_TABLET | Freq: Every day | ORAL | Status: DC
  Administered 2011-08-11 – 2011-08-13 (×3): 1 mg via ORAL
  Filled 2011-08-11 (×3): qty 1

## 2011-08-11 MED ORDER — MAGNESIUM OXIDE 400 (241.3 MG) MG PO TABS
400.0000 mg | ORAL_TABLET | Freq: Two times a day (BID) | ORAL | Status: DC
  Administered 2011-08-11 – 2011-08-13 (×5): 400 mg via ORAL
  Filled 2011-08-11 (×6): qty 1

## 2011-08-11 MED ORDER — THIAMINE HCL 100 MG PO TABS: 100.0000 mg | ORAL_TABLET | Freq: Every day | ORAL | Status: DC

## 2011-08-11 MED ORDER — PHENYTOIN SODIUM EXTENDED 100 MG PO CAPS
100.0000 mg | ORAL_CAPSULE | Freq: Three times a day (TID) | ORAL | Status: DC
  Administered 2011-08-11 – 2011-08-13 (×7): 100 mg via ORAL
  Filled 2011-08-11 (×8): qty 1

## 2011-08-11 MED ORDER — LORAZEPAM 2 MG/ML IJ SOLN: 0.0000 mg | Freq: Four times a day (QID) | INTRAMUSCULAR | Status: DC

## 2011-08-11 MED ORDER — LORAZEPAM 2 MG/ML IJ SOLN
INTRAMUSCULAR | Status: AC
  Administered 2011-08-11: 1 mg
  Filled 2011-08-11: qty 1

## 2011-08-11 MED ORDER — ONDANSETRON HCL 4 MG/2ML IJ SOLN
4.0000 mg | Freq: Once | INTRAMUSCULAR | Status: AC
  Administered 2011-08-11: 4 mg via INTRAVENOUS
  Filled 2011-08-11: qty 2

## 2011-08-11 MED ORDER — LISINOPRIL 10 MG PO TABS
10.0000 mg | ORAL_TABLET | Freq: Every day | ORAL | Status: DC
  Administered 2011-08-11 – 2011-08-13 (×3): 10 mg via ORAL
  Filled 2011-08-11 (×3): qty 1

## 2011-08-11 MED ORDER — THIAMINE HCL 100 MG/ML IJ SOLN: Freq: Once | INTRAVENOUS | Status: DC

## 2011-08-11 MED ORDER — ADULT MULTIVITAMIN W/MINERALS CH
1.0000 | ORAL_TABLET | Freq: Every day | ORAL | Status: DC
Start: 2011-08-11 — End: 2011-08-11

## 2011-08-11 MED ORDER — AMLODIPINE BESYLATE 5 MG PO TABS: 5.0000 mg | ORAL_TABLET | Freq: Every day | ORAL | Status: DC

## 2011-08-11 MED ORDER — SODIUM CHLORIDE 0.9 % IV BOLUS (SEPSIS)
1000.0000 mL | Freq: Once | INTRAVENOUS | Status: AC
  Administered 2011-08-11: 1000 mL via INTRAVENOUS

## 2011-08-11 MED ORDER — SERTRALINE HCL 100 MG PO TABS
100.0000 mg | ORAL_TABLET | Freq: Every day | ORAL | Status: DC
  Administered 2011-08-11 – 2011-08-13 (×3): 100 mg via ORAL
  Filled 2011-08-11 (×3): qty 1

## 2011-08-11 MED ORDER — SODIUM CHLORIDE 0.9 % IJ SOLN
3.0000 mL | Freq: Two times a day (BID) | INTRAMUSCULAR | Status: DC
  Administered 2011-08-11 – 2011-08-13 (×4): 3 mL via INTRAVENOUS

## 2011-08-11 MED ORDER — AMLODIPINE BESYLATE 5 MG PO TABS
5.0000 mg | ORAL_TABLET | Freq: Once | ORAL | Status: AC
  Administered 2011-08-11: 5 mg via ORAL
  Filled 2011-08-11: qty 1

## 2011-08-11 MED ORDER — MAGNESIUM SULFATE 40 MG/ML IJ SOLN
2.0000 g | Freq: Once | INTRAMUSCULAR | Status: AC
  Administered 2011-08-11: 2 g via INTRAVENOUS
  Filled 2011-08-11: qty 50

## 2011-08-11 MED ORDER — THIAMINE HCL 100 MG/ML IJ SOLN: 100.0000 mg | Freq: Every day | INTRAMUSCULAR | Status: DC

## 2011-08-11 MED ORDER — LORAZEPAM 2 MG/ML IJ SOLN
0.0000 mg | Freq: Four times a day (QID) | INTRAMUSCULAR | Status: AC
  Administered 2011-08-11: 2 mg via INTRAVENOUS
  Administered 2011-08-11 – 2011-08-12 (×2): 1 mg via INTRAVENOUS
  Filled 2011-08-11 (×2): qty 1

## 2011-08-11 MED ORDER — LORAZEPAM 1 MG PO TABS
1.0000 mg | ORAL_TABLET | Freq: Three times a day (TID) | ORAL | Status: DC
  Administered 2011-08-11 – 2011-08-13 (×6): 1 mg via ORAL
  Filled 2011-08-11 (×5): qty 1

## 2011-08-11 MED ORDER — ADULT MULTIVITAMIN W/MINERALS CH
1.0000 | ORAL_TABLET | Freq: Every day | ORAL | Status: DC
  Administered 2011-08-11 – 2011-08-13 (×3): 1 via ORAL
  Filled 2011-08-11 (×3): qty 1

## 2011-08-11 MED ORDER — OMEPRAZOLE 20 MG PO CPDR: 20.0000 mg | DELAYED_RELEASE_CAPSULE | Freq: Every day | ORAL | Status: DC

## 2011-08-11 MED ORDER — PANTOPRAZOLE SODIUM 40 MG IV SOLR
40.0000 mg | Freq: Once | INTRAVENOUS | Status: AC
Start: 2011-08-11 — End: 2011-08-11
  Administered 2011-08-11: 40 mg via INTRAVENOUS
  Filled 2011-08-11: qty 40

## 2011-08-11 NOTE — ED Notes (Signed)
Reported to physician assistant that patient is very unsteady on his feet and needs two person assist during ambulation.

## 2011-08-11 NOTE — ED Notes (Signed)
Nurse Tech Denisa pulled from her assignment and re-assign to be this Retail buyer.

## 2011-08-11 NOTE — ED Notes (Signed)
Report received from Night RN, First contact with patient, pt expressing wanting to go home using a bus, play some "scratch off" and come back. Hand tremors noted only at movement. Pt is alert, but disoriented to where he is and the date. According the night RN, pt was ambulated at 0645 and was unable to ambulate independently. Pt attempted to use the urinal and dropped the urinal on the floor. Informed the Charge Nurse that this patient needs a Recruitment consultant, no sitter available at the moment.

## 2011-08-11 NOTE — ED Notes (Signed)
This RN was standing at the nursing station and suddenly heard a thump, upon arrival to room, pt was found on the floor sitting against the bed, alert. No signs of injury noted. Charge Nurse informed and again Diplomatic Services operational officer, no sitter available in house at the moment. EDP. Dr. Bernette Mayers informed.

## 2011-08-11 NOTE — ED Notes (Signed)
Patient transported to X-ray 

## 2011-08-11 NOTE — ED Notes (Signed)
Patient confusing pulse oximeter probe with telephone, stating year is 45, unsure of president at this moment. Patient sts he is in a hospital but doesn't know which one. Patient reoriented, calm and cooperative.

## 2011-08-11 NOTE — Consult Note (Signed)
Reason for Consult:Seizure disorder Referring Physician: Dhungel  CC: Breakthrough seizures  HPI: Miguel Hanson is an 66 y.o. male with a history of ETOH abuse and seizure disorder who was brought in by EMS after having a witnessed seizure at home.  Patient unable to give any history.  Although patient reports being compliant with medications his Dilantin level on presentation was <2.5.  Patient was IV loaded with Dilantin and consult called for further recommendation  Past Medical History  Diagnosis Date  . Hypertension   . Seizures   . ETOH abuse   . Drug abuse   . PTSD (post-traumatic stress disorder)   . H/O prolonged Q-T interval on ECG   . Polysubstance abuse   . Alcohol abuse     History reviewed. No pertinent past surgical history.  History reviewed. No pertinent family history.  Social History:  reports that he has been smoking Cigarettes.  He has a 20 pack-year smoking history. He does not have any smokeless tobacco history on file. He reports that he drinks alcohol. He reports that he uses illicit drugs (Marijuana).  No Known Allergies  Medications:  I have reviewed the patient's current medications. Prior to Admission:  Prescriptions prior to admission  Medication Sig Dispense Refill  . amLODipine (NORVASC) 5 MG tablet Take 1 tablet (5 mg total) by mouth daily.  30 tablet  0  . folic acid (FOLVITE) 1 MG tablet Take 1 tablet (1 mg total) by mouth daily.  30 tablet  0  . lisinopril (PRINIVIL,ZESTRIL) 20 MG tablet Take 0.5 tablets (10 mg total) by mouth daily.  30 tablet  0  . magnesium oxide (MAG-OX) 400 MG tablet Take 1 tablet (400 mg total) by mouth 2 (two) times daily.  60 tablet  0  . Multiple Vitamin (MULITIVITAMIN WITH MINERALS) TABS Take 1 tablet by mouth daily.  30 tablet  0  . phenytoin (DILANTIN) 100 MG ER capsule Take 1 capsule (100 mg total) by mouth 3 (three) times daily.  35 capsule  0  . sertraline (ZOLOFT) 100 MG tablet Take 1 tablet (100 mg total) by  mouth daily.  30 tablet  0  . thiamine 100 MG tablet Take 1 tablet (100 mg total) by mouth daily.  30 tablet  0   Scheduled:   . amLODipine  5 mg Oral Once  . amLODipine  5 mg Oral Daily  . enoxaparin  40 mg Subcutaneous Q24H  . folic acid  1 mg Oral Daily  . lisinopril  10 mg Oral Daily  . LORazepam      . LORazepam  0-4 mg Intravenous Q6H   Followed by  . LORazepam  0-4 mg Intravenous Q12H  . magnesium oxide  400 mg Oral BID  . magnesium sulfate 1 - 4 g bolus IVPB  2 g Intravenous Once  . multivitamin with minerals  1 tablet Oral Daily  . ondansetron (ZOFRAN) IV  4 mg Intravenous Once  . pantoprazole (PROTONIX) IV  40 mg Intravenous Once  . phenytoin (DILANTIN) IV  1,000 mg Intravenous Once  . phenytoin  100 mg Oral TID  . sertraline  100 mg Oral Daily  . sodium chloride  1,000 mL Intravenous Once  . sodium chloride  3 mL Intravenous Q12H  . thiamine  100 mg Oral Daily   Or  . thiamine  100 mg Intravenous Daily  . DISCONTD: folic acid  1 mg Oral Daily  . DISCONTD: folic acid  1 mg Oral Daily  .  DISCONTD: LORazepam  0-4 mg Intravenous Q6H  . DISCONTD: LORazepam  0-4 mg Intravenous Q12H  . DISCONTD: LORazepam  0-4 mg Intravenous Q6H  . DISCONTD: LORazepam  0-4 mg Intravenous Q12H  . DISCONTD: multivitamin with minerals  1 tablet Oral Daily  . DISCONTD: multivitamin with minerals  1 tablet Oral Daily  . DISCONTD: banana bag IV fluid 1000 mL   Intravenous Once  . DISCONTD: thiamine  100 mg Intravenous Daily  . DISCONTD: thiamine  100 mg Oral Daily  . DISCONTD: thiamine  100 mg Oral Daily    ROS: History obtained from the patient  General ROS: negative for - chills, fatigue, fever, night sweats, weight gain or weight loss Psychological ROS: negative for - behavioral disorder, hallucinations, memory difficulties, mood swings or suicidal ideation Ophthalmic ROS: negative for - blurry vision, double vision, eye pain or loss of vision ENT ROS: negative for - epistaxis, nasal  discharge, oral lesions, sore throat, tinnitus or vertigo Allergy and Immunology ROS: negative for - hives or itchy/watery eyes Hematological and Lymphatic ROS: negative for - bleeding problems, bruising or swollen lymph nodes Endocrine ROS: negative for - galactorrhea, hair pattern changes, polydipsia/polyuria or temperature intolerance Respiratory ROS: negative for - cough, hemoptysis, shortness of breath or wheezing Cardiovascular ROS: negative for - chest pain, dyspnea on exertion, edema or irregular heartbeat Gastrointestinal ROS: negative for - abdominal pain, diarrhea, hematemesis, nausea/vomiting or stool incontinence Genito-Urinary ROS: negative for - dysuria, hematuria, incontinence or urinary frequency/urgency Musculoskeletal ROS: negative for - joint swelling or muscular weakness Neurological ROS: as noted in HPI Dermatological ROS: negative for rash and skin lesion changes Physical Examination: Blood pressure 135/94, pulse 112, temperature 98.6 F (37 C), temperature source Oral, resp. rate 20, height 5\' 9"  (1.753 m), weight 56.4 kg (124 lb 5.4 oz), SpO2 98.00%.  Neurologic Examination Mental Status: Lethargic.  Knows he is in the hospital but does no know which one.  Unable to tell me the  Speech fluent without evidence of aphasia.  Able to follow 3 step commands without difficulty. Cranial Nerves: II: visual fields grossly normal, pupils equal, round, reactive to light and accommodation III,IV, VI: ptosis not present, extra-ocular motions intact bilaterally V,VII: decrease right NLF, facial light touch sensation normal bilaterally VIII: hearing normal bilaterally IX,X: gag reflex present XI: trapezius strength/neck flexion strength normal bilaterally XII: tongue strength normal  Motor: Right : Upper extremity   5/5    Left:     Upper extremity   5/5  Lower extremity   5/5     Lower extremity   5/5 Tone and bulk:normal tone throughout; no atrophy noted Sensory: Pinprick  and light touch intact throughout, bilaterally Deep Tendon Reflexes: 2+ with absent left AJ Plantars: Right: downgoing   Left: upgoing Cerebellar: normal finger-to-nose and normal heel-to-shin test  Results for orders placed during the hospital encounter of 08/10/11 (from the past 48 hour(s))  GLUCOSE, CAPILLARY     Status: Abnormal   Collection Time   08/11/11  1:28 AM      Component Value Range Comment   Glucose-Capillary 106 (*) 70 - 99 (mg/dL)    Comment 1 Documented in Chart      Comment 2 Notify RN     CBC     Status: Abnormal   Collection Time   08/11/11  1:29 AM      Component Value Range Comment   WBC 4.4  4.0 - 10.5 (K/uL)    RBC 3.22 (*) 4.22 - 5.81 (  MIL/uL)    Hemoglobin 12.7 (*) 13.0 - 17.0 (g/dL)    HCT 78.2 (*) 95.6 - 52.0 (%)    MCV 103.4 (*) 78.0 - 100.0 (fL)    MCH 39.4 (*) 26.0 - 34.0 (pg)    MCHC 37.3 (*) 30.0 - 36.0 (g/dL)    RDW 21.3 (*) 08.6 - 15.5 (%)    Platelets 123 (*) 150 - 400 (K/uL)   DIFFERENTIAL     Status: Normal   Collection Time   08/11/11  1:29 AM      Component Value Range Comment   Neutrophils Relative 56  43 - 77 (%)    Neutro Abs 2.4  1.7 - 7.7 (K/uL)    Lymphocytes Relative 31  12 - 46 (%)    Lymphs Abs 1.4  0.7 - 4.0 (K/uL)    Monocytes Relative 8  3 - 12 (%)    Monocytes Absolute 0.3  0.1 - 1.0 (K/uL)    Eosinophils Relative 5  0 - 5 (%)    Eosinophils Absolute 0.2  0.0 - 0.7 (K/uL)    Basophils Relative 1  0 - 1 (%)    Basophils Absolute 0.0  0.0 - 0.1 (K/uL)   COMPREHENSIVE METABOLIC PANEL     Status: Abnormal   Collection Time   08/11/11  1:29 AM      Component Value Range Comment   Sodium 132 (*) 135 - 145 (mEq/L)    Potassium 3.5  3.5 - 5.1 (mEq/L)    Chloride 95 (*) 96 - 112 (mEq/L)    CO2 22  19 - 32 (mEq/L)    Glucose, Bld 103 (*) 70 - 99 (mg/dL)    BUN 4 (*) 6 - 23 (mg/dL)    Creatinine, Ser 5.78  0.50 - 1.35 (mg/dL)    Calcium 8.8  8.4 - 10.5 (mg/dL)    Total Protein 7.4  6.0 - 8.3 (g/dL)    Albumin 3.7  3.5 - 5.2  (g/dL)    AST 46 (*) 0 - 37 (U/L)    ALT 14  0 - 53 (U/L)    Alkaline Phosphatase 73  39 - 117 (U/L)    Total Bilirubin 0.4  0.3 - 1.2 (mg/dL)    GFR calc non Af Amer >90  >90 (mL/min)    GFR calc Af Amer >90  >90 (mL/min)   LIPASE, BLOOD     Status: Normal   Collection Time   08/11/11  1:29 AM      Component Value Range Comment   Lipase 36  11 - 59 (U/L)   ETHANOL     Status: Normal   Collection Time   08/11/11  1:29 AM      Component Value Range Comment   Alcohol, Ethyl (B) <11  0 - 11 (mg/dL)   PHENYTOIN LEVEL, TOTAL     Status: Abnormal   Collection Time   08/11/11  1:29 AM      Component Value Range Comment   Phenytoin Lvl <2.5 (*) 10.0 - 20.0 (ug/mL)   URINE RAPID DRUG SCREEN (HOSP PERFORMED)     Status: Normal   Collection Time   08/11/11  2:01 AM      Component Value Range Comment   Opiates NONE DETECTED  NONE DETECTED     Cocaine NONE DETECTED  NONE DETECTED     Benzodiazepines NONE DETECTED  NONE DETECTED     Amphetamines NONE DETECTED  NONE DETECTED     Tetrahydrocannabinol NONE  DETECTED  NONE DETECTED     Barbiturates NONE DETECTED  NONE DETECTED    URINALYSIS, ROUTINE W REFLEX MICROSCOPIC     Status: Abnormal   Collection Time   08/11/11  2:01 AM      Component Value Range Comment   Color, Urine YELLOW  YELLOW     APPearance CLEAR  CLEAR     Specific Gravity, Urine 1.018  1.005 - 1.030     pH 7.0  5.0 - 8.0     Glucose, UA NEGATIVE  NEGATIVE (mg/dL)    Hgb urine dipstick NEGATIVE  NEGATIVE     Bilirubin Urine NEGATIVE  NEGATIVE     Ketones, ur NEGATIVE  NEGATIVE (mg/dL)    Protein, ur 30 (*) NEGATIVE (mg/dL)    Urobilinogen, UA 0.2  0.0 - 1.0 (mg/dL)    Nitrite NEGATIVE  NEGATIVE     Leukocytes, UA NEGATIVE  NEGATIVE    URINE MICROSCOPIC-ADD ON     Status: Normal   Collection Time   08/11/11  2:01 AM      Component Value Range Comment   Squamous Epithelial / LPF RARE  RARE     WBC, UA 0-2  <3 (WBC/hpf)    RBC / HPF 0-2  <3 (RBC/hpf)    Bacteria, UA RARE   RARE     Urine-Other MUCOUS PRESENT     MAGNESIUM     Status: Abnormal   Collection Time   08/11/11  9:05 AM      Component Value Range Comment   Magnesium 1.3 (*) 1.5 - 2.5 (mg/dL)   HEPATIC FUNCTION PANEL     Status: Abnormal   Collection Time   08/11/11  9:05 AM      Component Value Range Comment   Total Protein 8.0  6.0 - 8.3 (g/dL)    Albumin 4.1  3.5 - 5.2 (g/dL)    AST 51 (*) 0 - 37 (U/L) NO VISIBLE HEMOLYSIS   ALT 15  0 - 53 (U/L)    Alkaline Phosphatase 81  39 - 117 (U/L)    Total Bilirubin 0.6  0.3 - 1.2 (mg/dL)    Bilirubin, Direct 0.1  0.0 - 0.3 (mg/dL) NO VISIBLE HEMOLYSIS   Indirect Bilirubin 0.5  0.3 - 0.9 (mg/dL)     No results found for this or any previous visit (from the past 240 hour(s)).  Dg Chest 2 View  08/11/2011  *RADIOLOGY REPORT*  Clinical Data: Seizure.  Hypertension.  Smoker.  CHEST - 2 VIEW  Comparison: 03/08/2011  Findings: Midline trachea.  Normal heart size.  Atherosclerosis in the transverse aorta. No pleural effusion or pneumothorax. Moderate interstitial thickening. Clear lungs.  Numerous upper and mid thoracic compression deformities.  Grossly similar to 05/07/2010.  No canal compromise. Lateral view degraded by patient arm position.  IMPRESSION: COPD/chronic bronchitis. No acute superimposed process.  Thoracic compression deformities.  Favored to be similar to 05/07/2010.  Original Report Authenticated By: Consuello Bossier, [name]   Ct Head Wo Contrast  08/11/2011  *RADIOLOGY REPORT*  Clinical Data: Seizure.  History of seizures.  Ethanol and drug abuse.  CT HEAD WITHOUT CONTRAST  Technique:  Contiguous axial images were obtained from the base of the skull through the vertex without contrast.  Comparison: 07/05/2011  Findings: Bone windows demonstrate remote right nasal bone fracture with septal deviation to the right.  Left sphenoid sinus mucosal thickening with chronic osseous thickening.  Near complete opacification of left frontal sinus.  Aerated petrous  apices. Clear mastoid air cells.  Soft tissue windows demonstrate advanced cerebral atrophy.  Mild low density in the periventricular white matter likely related to small vessel disease.Cerebellar atrophy as well. No  mass lesion, hemorrhage, hydrocephalus, acute infarct, intra-axial, or extra- axial fluid collection.  IMPRESSION:  1. No acute intracranial abnormality. 2.  Sinus disease. 3.  Age advanced cerebral and cerebellar atrophy.  Mild small vessel ischemic change.  Original Report Authenticated By: Consuello Bossier, [name]     Assessment/Plan:  Patient Active Hospital Problem List: Seizure, convulsive (08/11/2011)   Assessment: Patient with a history of seizures.  Non-compliant.  Loaded with Dilantin.  Will restart on home dose.  Level should be repeated.  Once level therapeutic and patient back to baseline mental status would discharge to home with outpatient follow up.     Plan:  1.  Dilantin level in AM  2.  Restart maintenance Dilantin dose prescribed for home.    Thana Farr, MD [facility] [phone] 08/11/2011, 3:20 PM

## 2011-08-11 NOTE — ED Provider Notes (Signed)
History     CSN: [id number]  Arrival date & time 08/10/11  2337   First MD Initiated Contact with Patient 08/11/11 0049      Chief Complaint  Patient presents with  . Seizures   Level V applies secondary to altered mental status  HPI  History provided by patient and EMS report. Patient is a 66 year old male history of hypertension, polysubstance abuse, alcohol abuse and seizure disorder. Patient arrives by EMS after reported seizure at home. Per EMS patient's wife states that patient had a seizure while in his bed somewhat routine 10:30 and 11:00. Patient does appear confused and postictal upon EMS arrival. Patient was also covered in vomit at that time. Patient's wife report that patient had been using alcohol. Patient was responding to some verbal stimuli. Patient did have an IV placed with fluids. There is no other intervention taken. Patient has not had any recurrent seizures. Patient does not recall the episode and is unable to provide any additional history. Patient is confused with the year and location.    Past Medical History  Diagnosis Date  . Hypertension   . Seizures   . ETOH abuse   . Drug abuse   . PTSD (post-traumatic stress disorder)   . H/O prolonged Q-T interval on ECG   . Polysubstance abuse   . Alcohol abuse     History reviewed. No pertinent past surgical history.  History reviewed. No pertinent family history.  History  Substance Use Topics  . Smoking status: Current Some Day Smoker  . Smokeless tobacco: Not on file  . Alcohol Use: Yes     abuse/binge      Review of Systems  Unable to perform ROS: Mental status change    Allergies  Review of patient's allergies indicates no known allergies.  Home Medications   Current Outpatient Rx  Name Route Sig Dispense Refill  . AMLODIPINE BESYLATE 5 MG PO TABS Oral Take 1 tablet (5 mg total) by mouth daily. 30 tablet 0  . FOLIC ACID 1 MG PO TABS Oral Take 1 tablet (1 mg total) by mouth daily. 30  tablet 0  . LISINOPRIL 20 MG PO TABS Oral Take 0.5 tablets (10 mg total) by mouth daily. 30 tablet 0  . MAGNESIUM OXIDE 400 MG PO TABS Oral Take 1 tablet (400 mg total) by mouth 2 (two) times daily. 60 tablet 0  . ADULT MULTIVITAMIN W/MINERALS CH Oral Take 1 tablet by mouth daily. 30 tablet 0  . PHENYTOIN SODIUM EXTENDED 100 MG PO CAPS Oral Take 1 capsule (100 mg total) by mouth 3 (three) times daily. 35 capsule 0  . SERTRALINE HCL 100 MG PO TABS Oral Take 1 tablet (100 mg total) by mouth daily. 30 tablet 0  . THIAMINE HCL 100 MG PO TABS Oral Take 1 tablet (100 mg total) by mouth daily. 30 tablet 0    BP 167/109  Pulse 104  Temp(Src) 98.6 F (37 C) (Oral)  Resp 19  SpO2 100%  Physical Exam  Nursing note and vitals reviewed. Constitutional: He appears well-developed and well-nourished. No distress.       Patient covered in vomit.  HENT:  Head: Normocephalic and atraumatic.  Eyes: Conjunctivae and EOM are normal.       Arcus senilis  Cardiovascular: Normal rate and regular rhythm.   Pulmonary/Chest: Effort normal and breath sounds normal. No respiratory distress. He has no wheezes. He has no rales.  Abdominal: Soft. There is tenderness. There is no rebound  and no guarding.       Diffuse tenderness. No peritoneal signs.  Neurological: He is alert. No cranial nerve deficit.       Patient oriented to place or date. Patient is unable to provide place current year. Patient can list his family members names.  Strength equal bilaterally. No drift.  Skin: Skin is warm.  Psychiatric: He has a normal mood and affect. His behavior is normal.    ED Course  Procedures  Results for orders placed during the hospital encounter of 08/10/11  CBC      Component Value Range   WBC 4.4  4.0 - 10.5 (K/uL)   RBC 3.22 (*) 4.22 - 5.81 (MIL/uL)   Hemoglobin 12.7 (*) 13.0 - 17.0 (g/dL)   HCT 16.1 (*) 09.6 - 52.0 (%)   MCV 103.4 (*) 78.0 - 100.0 (fL)   MCH 39.4 (*) 26.0 - 34.0 (pg)   MCHC 37.3 (*)  30.0 - 36.0 (g/dL)   RDW 04.5 (*) 40.9 - 15.5 (%)   Platelets 123 (*) 150 - 400 (K/uL)  DIFFERENTIAL      Component Value Range   Neutrophils Relative 56  43 - 77 (%)   Neutro Abs 2.4  1.7 - 7.7 (K/uL)   Lymphocytes Relative 31  12 - 46 (%)   Lymphs Abs 1.4  0.7 - 4.0 (K/uL)   Monocytes Relative 8  3 - 12 (%)   Monocytes Absolute 0.3  0.1 - 1.0 (K/uL)   Eosinophils Relative 5  0 - 5 (%)   Eosinophils Absolute 0.2  0.0 - 0.7 (K/uL)   Basophils Relative 1  0 - 1 (%)   Basophils Absolute 0.0  0.0 - 0.1 (K/uL)  COMPREHENSIVE METABOLIC PANEL      Component Value Range   Sodium 132 (*) 135 - 145 (mEq/L)   Potassium 3.5  3.5 - 5.1 (mEq/L)   Chloride 95 (*) 96 - 112 (mEq/L)   CO2 22  19 - 32 (mEq/L)   Glucose, Bld 103 (*) 70 - 99 (mg/dL)   BUN 4 (*) 6 - 23 (mg/dL)   Creatinine, Ser 8.11  0.50 - 1.35 (mg/dL)   Calcium 8.8  8.4 - 91.4 (mg/dL)   Total Protein 7.4  6.0 - 8.3 (g/dL)   Albumin 3.7  3.5 - 5.2 (g/dL)   AST 46 (*) 0 - 37 (U/L)   ALT 14  0 - 53 (U/L)   Alkaline Phosphatase 73  39 - 117 (U/L)   Total Bilirubin 0.4  0.3 - 1.2 (mg/dL)   GFR calc non Af Amer >90  >90 (mL/min)   GFR calc Af Amer >90  >90 (mL/min)  LIPASE, BLOOD      Component Value Range   Lipase 36  11 - 59 (U/L)  ETHANOL      Component Value Range   Alcohol, Ethyl (B) <11  0 - 11 (mg/dL)  URINE RAPID DRUG SCREEN (HOSP PERFORMED)      Component Value Range   Opiates NONE DETECTED  NONE DETECTED    Cocaine NONE DETECTED  NONE DETECTED    Benzodiazepines NONE DETECTED  NONE DETECTED    Amphetamines NONE DETECTED  NONE DETECTED    Tetrahydrocannabinol NONE DETECTED  NONE DETECTED    Barbiturates NONE DETECTED  NONE DETECTED   URINALYSIS, ROUTINE W REFLEX MICROSCOPIC      Component Value Range   Color, Urine YELLOW  YELLOW    APPearance CLEAR  CLEAR    Specific  Gravity, Urine 1.018  1.005 - 1.030    pH 7.0  5.0 - 8.0    Glucose, UA NEGATIVE  NEGATIVE (mg/dL)   Hgb urine dipstick NEGATIVE  NEGATIVE     Bilirubin Urine NEGATIVE  NEGATIVE    Ketones, ur NEGATIVE  NEGATIVE (mg/dL)   Protein, ur 30 (*) NEGATIVE (mg/dL)   Urobilinogen, UA 0.2  0.0 - 1.0 (mg/dL)   Nitrite NEGATIVE  NEGATIVE    Leukocytes, UA NEGATIVE  NEGATIVE   PHENYTOIN LEVEL, TOTAL      Component Value Range   Phenytoin Lvl <2.5 (*) 10.0 - 20.0 (ug/mL)  GLUCOSE, CAPILLARY      Component Value Range   Glucose-Capillary 106 (*) 70 - 99 (mg/dL)   Comment 1 Documented in Chart     Comment 2 Notify RN    URINE MICROSCOPIC-ADD ON      Component Value Range   Squamous Epithelial / LPF RARE  RARE    WBC, UA 0-2  <3 (WBC/hpf)   RBC / HPF 0-2  <3 (RBC/hpf)   Bacteria, UA RARE  RARE    Urine-Other MUCOUS PRESENT         Dg Chest 2 View  08/11/2011  *RADIOLOGY REPORT*  Clinical Data: Seizure.  Hypertension.  Smoker.  CHEST - 2 VIEW  Comparison: 03/08/2011  Findings: Midline trachea.  Normal heart size.  Atherosclerosis in the transverse aorta. No pleural effusion or pneumothorax. Moderate interstitial thickening. Clear lungs.  Numerous upper and mid thoracic compression deformities.  Grossly similar to 05/07/2010.  No canal compromise. Lateral view degraded by patient arm position.  IMPRESSION: COPD/chronic bronchitis. No acute superimposed process.  Thoracic compression deformities.  Favored to be similar to 05/07/2010.  Original Report Authenticated By: Consuello Bossier, [name]   Ct Head Wo Contrast  08/11/2011  *RADIOLOGY REPORT*  Clinical Data: Seizure.  History of seizures.  Ethanol and drug abuse.  CT HEAD WITHOUT CONTRAST  Technique:  Contiguous axial images were obtained from the base of the skull through the vertex without contrast.  Comparison: 07/05/2011  Findings: Bone windows demonstrate remote right nasal bone fracture with septal deviation to the right.  Left sphenoid sinus mucosal thickening with chronic osseous thickening.  Near complete opacification of left frontal sinus.  Aerated petrous apices. Clear mastoid air  cells.  Soft tissue windows demonstrate advanced cerebral atrophy.  Mild low density in the periventricular white matter likely related to small vessel disease.Cerebellar atrophy as well. No  mass lesion, hemorrhage, hydrocephalus, acute infarct, intra-axial, or extra- axial fluid collection.  IMPRESSION:  1. No acute intracranial abnormality. 2.  Sinus disease. 3.  Age advanced cerebral and cerebellar atrophy.  Mild small vessel ischemic change.  Original Report Authenticated By: Consuello Bossier, [name]     1. Seizure   2. Alcohol abuse   3. Vomiting   4. Hypertension       MDM  Patient seen and evaluated. Patient in no acute distress. Patient covered in vomit. Patient is awake and alert and responds. Patient does have signs of altered mental status.   Patient has continued to do well without any seizure activity. Labs are unremarkable today as well as normal imaging. Patient does have low Dilantin level. We'll plan to load Dilantin here in the emergency room.   Patient given Dilantin. Blood pressures have improved. Heart rate occasionally slightly tachycardic but this is thought secondary to decreasing alcohol levels. IV fluid bolus given. Patient has been ambulating and states she's ready  to return home. Patient appears at baseline mental status.    Angus Seller, [place] 08/11/11 [phone]

## 2011-08-11 NOTE — ED Provider Notes (Signed)
Patient with a history of polysubstance abuse, seizures, and hypertension care resumed from [place].  Patient presented to the emergency department for a seizure and altered mental status.  Patient was postictal at that time and a loading dose of Dilantin was given do to low levels being found.  CT of head negative for for acute intracranial abnormalities.  Plan per previous provider is to have patient ambulated at 8 a.m. for reassessment prior to discharge.  8:07 AM  Patient had unwitnessed fall in emergency department. Will page [facility] to admit patient for ataxia.  Repeat CT not indicated because no focal neuro deficits found on reexamination, patient denies hitting head, and he denies loss of consciousness.  Discussed with Dr. Renae Gloss who agrees with my plan to admit patient.  8:27 AM The patient appears reasonably stabilized for admission considering the current resources, flow, and capabilities available in the ED at this time, and I doubt any other [facility] requiring further screening and/or treatment in the ED prior to admission. [facility] team 5 tele   Jaci Carrel, [place] 08/11/11 [phone]

## 2011-08-11 NOTE — ED Provider Notes (Signed)
Medical screening examination/treatment/procedure(s) were conducted as a shared visit with non-physician practitioner(s) and myself.  I personally evaluated the patient during the encounter  Pt with seizure history, subtherapeutic on dilantin. Given Ativan for seizures and for concern of EtOH withdrawal. Unsteady on feet, will need to be reassessed for clearing.   Akaya Proffit B. Bernette Mayers, MD 08/11/11 [phone]

## 2011-08-11 NOTE — ED Notes (Signed)
Admission MD in room, will transport pt when admission MD finish his assessment.

## 2011-08-11 NOTE — Discharge Instructions (Signed)
You were seen and evaluated today for your seizure. Your lab tests, x-rays and CAT scan had not shown any concerning causes for your seizure. Your lab test today do show that your Dilantin level was low and you're given this medication in the emergency room to prevent further seizures. Please take your medication as prescribed to prevent any additional seizures. It is recommended that you do not drink any alcohol. If you feel you need help with your alcohol use please use the resource guide below to call for help. At this time your providers feel you may return home and followup with your primary care provider. Continue to take your blood pressure medications as prescribed. Return to emergency room for any worsening symptoms.   Seizure, Adult A seizure is when the body shakes uncontrollably (convulsion). It can be a scary experience. A seizure is not a diagnosis. It is a sign that something else may be wrong with brain and/or spinal cord (central nervous system). In the Emergency Department, your condition is evaluated. The seizure is then treated. You will likely need follow-up with your caregiver. You will possibly need further testing and evaluation. Your caregiver or the specialist to whom you are referred will determine if further treatment is needed. After a seizure, you may be confused, dazed and drowsy. These problems (symptoms) often follow a seizure. Medication given to treat the seizure may also cause some of these changes. The time following a seizure is known as a refractory period. Hospital admission is seldom required unless there are other conditions present such as trauma or metabolic problems. Sometimes the seizure activity follows a fainting episode. This may have been caused by a brief drop in blood pressure. These fainting (syncopal) seizures are generally not a cause for concern.  HOME CARE INSTRUCTIONS   Follow up with your caregiver as suggested.   If any problems happen, get help  right away.   Do not swim or drive until your caregiver says it is okay.  Document Released: 02/14/2000 Document Revised: 02/05/2011 Document Reviewed: 02/04/2011 [facility] Patient Information 2012 [place], [place].     Alcohol Problems Most adults who drink alcohol drink in moderation (not a lot) are at low risk for developing problems related to their drinking. However, all drinkers, including low-risk drinkers, should know about the health risks connected with drinking alcohol. RECOMMENDATIONS FOR LOW-RISK DRINKING  Drink in moderation. Moderate drinking is defined as follows:   Men - no more than 2 drinks per day.   Nonpregnant women - no more than 1 drink per day.   Over age 53 - no more than 1 drink per day.  A standard drink is 12 grams of pure alcohol, which is equal to a 12 ounce bottle of beer or wine cooler, a 5 ounce glass of wine, or 1.5 ounces of distilled spirits (such as whiskey, brandy, vodka, or rum).  ABSTAIN FROM (DO NOT DRINK) ALCOHOL:  When pregnant or considering pregnancy.   When taking a medication that interacts with alcohol.   If you are alcohol dependent.   A medical condition that prohibits drinking alcohol (such as ulcer, liver disease, or heart disease).  DISCUSS WITH YOUR CAREGIVER:  If you are at risk for coronary heart disease, discuss the potential benefits and risks of alcohol use: Light to moderate drinking is associated with lower rates of coronary heart disease in certain populations (for example, men over age 5 and postmenopausal women). Infrequent or nondrinkers are advised not to begin light to moderate  drinking to reduce the risk of coronary heart disease so as to avoid creating an alcohol-related problem. Similar protective effects can likely be gained through proper diet and exercise.   Women and the elderly have smaller amounts of body water than men. As a result women and the elderly achieve a higher blood alcohol concentration after  drinking the same amount of alcohol.   Exposing a fetus to alcohol can cause a broad range of birth defects referred to as Fetal Alcohol Syndrome (FAS) or Alcohol-Related Birth Defects (ARBD). Although FAS/ARBD is connected with excessive alcohol consumption during pregnancy, studies also have reported neurobehavioral problems in infants born to mothers reporting drinking an average of 1 drink per day during pregnancy.   Heavier drinking (the consumption of more than 4 drinks per occasion by men and more than 3 drinks per occasion by women) impairs learning (cognitive) and psychomotor functions and increases the risk of alcohol-related problems, including accidents and injuries.  CAGE QUESTIONS:   Have you ever felt that you should Cut down on your drinking?   Have people Annoyed you by criticizing your drinking?   Have you ever felt bad or Guilty about your drinking?   Have you ever had a drink first thing in the morning to steady your nerves or get rid of a hangover (Eye opener)?  If you answered positively to any of these questions: You may be at risk for alcohol-related problems if alcohol consumption is:   Men: Greater than 14 drinks per week or more than 4 drinks per occasion.   Women: Greater than 7 drinks per week or more than 3 drinks per occasion.  Do you or your family have a medical history of alcohol-related problems, such as:  Blackouts.   Sexual dysfunction.   Depression.   Trauma.   Liver dysfunction.   Sleep disorders.   Hypertension.   Chronic abdominal pain.   Has your drinking ever caused you problems, such as problems with your family, problems with your work (or school) performance, or accidents/injuries?   Do you have a compulsion to drink or a preoccupation with drinking?   Do you have poor control or are you unable to stop drinking once you have started?   Do you have to drink to avoid withdrawal symptoms?   Do you have problems with withdrawal  such as tremors, nausea, sweats, or mood disturbances?   Does it take more alcohol than in the past to get you high?   Do you feel a strong urge to drink?   Do you change your plans so that you can have a drink?   Do you ever drink in the morning to relieve the shakes or a hangover?  If you have answered a number of the previous questions positively, it may be time for you to talk to your caregivers, family, and friends and see if they think you have a problem. Alcoholism is a chemical dependency that keeps getting worse and will eventually destroy your health and relationships. Many alcoholics end up dead, impoverished, or in prison. This is often the end result of all chemical dependency.  Do not be discouraged if you are not ready to take action immediately.   Decisions to change behavior often involve up and down desires to change and feeling like you cannot decide.   Try to think more seriously about your drinking behavior.   Think of the reasons to quit.  WHERE TO GO FOR ADDITIONAL INFORMATION   The [employer]  on Alcohol Abuse and Alcoholism (NIAAA)www.niaaa.nih.gov   [employer] on Alcoholism and Drug Dependence (NCADD)www.ncadd.org   American Society of Addiction Medicine (ASAM)www.https://anderson-johnson.com/  Document Released: 02/16/2005 Document Revised: 02/05/2011 Document Reviewed: 10/05/2007 [facility] Patient Information 2012 [place], [place].     RESOURCE GUIDE  Chronic Pain Problems: Contact [facility]  [phone] Patients need to be referred by their primary care doctor.  Insufficient Money for Medicine: Contact United Way:  call "211" or [facility] [phone].  No Primary Care Doctor: - Call Health Connect  [phone] - can help you locate a primary care doctor that  accepts your insurance, provides certain services, etc. - Physician Referral Service- [phone]  Agencies that provide inexpensive medical care: - [facility]  [phone] - [facility]  [phone] - [facility]  [phone] - Women's Clinic  [phone] - Planned Parenthood  [phone] [facility]  [phone]  Medicaid-accepting [facility] Providers: - [facility]- [address]  [phone], Mon-Fri 9am-7pm, Sat 9am-1pm - [facility]- [address] [place], [address] [place]  [phone] - [facility]- [address] [place]  [phone] [facility]- [address]  [phone] - Renaye Rakers- [address] [place], [address], [phone]  Only accepts [place] patients after they have their name  applied to their card  Self Pay (no insurance) in [place]: - Sickle Cell Patients: Dr Willey Blade, [facility]  [address] [place], [phone] - [facility]- [address] [place]  [phone]       [facility] [place]- [address]       -     [facility]- see information above (Speak to [employer] if you do not have insurance)       -  [facility]- [address] [place], [phone]       -  [facility]- [address],  [phone]       -  [facility]- [address], [phone]       -  Dr Julio Sicks-  [address], [place], [phone]       -  [facility]- [address] [phone]       -  [facility]- [address] [phone], also [address], [phone]       -    [facility]- [address] [place], [phone], 1st & 3rd Saturday   every month, 10am-1pm  1) Find a Doctor and Pay Out of Pocket Although you won't have to find out who is covered by your insurance plan, it is a good idea to ask around and get recommendations. You will then need to call the office and see if the doctor you have chosen will accept you as a new patient and what types of  options they offer for patients who are self-pay. Some doctors offer discounts or will set up payment plans for their patients who do not have insurance, but you will need to ask so you aren't surprised when you get to your appointment.  2) Contact Your Local Health Department Not all health departments have doctors that can see patients for sick visits, but many do, so it is  worth a call to see if yours does. If you don't know where your local health department is, you can check in your phone book. The CDC also has a tool to help you locate your state's health department, and many state websites also have listings of all of their local health departments.  3) Find a Walk-in Clinic If your illness is not likely to be very severe or complicated, you may want to try a walk in clinic. These are popping up all over the country in pharmacies, drugstores, and shopping centers. They're usually staffed by nurse practitioners or physician assistants that have been trained to treat common illnesses and complaints. They're usually fairly quick and inexpensive. However, if you have serious medical issues or chronic medical problems, these are probably not your best option  STD Testing - [facility] Department of [facility], STD Clinic, [address], [place], phone [phone] or [phone].  Monday - Friday, call for an appointment. [facility] Department of [employer], STD Clinic, [address], [place], phone [phone] or [phone].  Monday - Friday, call for an appointment.  Abuse/Neglect: [facility] Child Abuse Hotline 380-034-1247 [facility] Child Abuse Hotline (716)391-9473 (After Hours)  Emergency Shelter:  [facility] 3521000761  Maternity Homes: - [facility] [place] [facility] 510-390-3181 - [facility] [phone]  MRSA Hotline #:   (541)627-1267  [facility] Resources  [facility] [place]  United Way [facility] Dept. [address]                 [address]         [address] [place] [address]  Blondell Reveal Phone:  [phone]                                  Phone:  [phone]                   Phone:  [phone]  [facility] Health, [phone] - [facility]([phone]       -     [facility] in [place], [address]                                  ([phone], [facility] Child Abuse Hotline (424)857-5610 or 580-219-0866 (After Hours)   Behavioral Health Services  Substance Abuse Resources: - [facility] Drug Services  412-239-9183 - [facility] [phone] - [facility] [phone] Floydene Flock (972)233-9296 - Residential & Outpatient Substance Abuse Program  939-691-6498  Psychological Services: [facility]  [phone] Services  [phone] - [facility], ([phone] [place]. [address] [place], ACCESS LINE: 337-612-4115 or 364-287-2654, [employer].co.za  Dental Assistance  If  unable to pay or uninsured, contact:  [facility] or [facility]. to become qualified for the adult dental clinic.  Patients with Medicaid: [facility] 919-692-4145 [address], [phone] [address], [phone]  If unable to pay, or uninsured, contact [facility] [phone]) or [facility] [phone] in [place], [phone] in [facility]) to become qualified for the adult dental clinic  Other Low-Cost Community Dental Services: - [facility]- [address] [place], [place], [place], [address], [phone], [address], 2nd and 4th Thursday of the month at 6:30am.  10 clients each day by appointment, can sometimes see  walk-in patients if someone does not show for an appointment. [facility]- [address] [place], [place], [address], [phone] - [facility]- [address] [place], [place], [address], [phone] - [facility]- [phone] [facility]- [phone] [facility] Department- [phone]

## 2011-08-11 NOTE — H&P (Signed)
PCP:  Provider Not In System   DOA:  08/10/2011 11:45 PM  Chief Complaint:  generalized seizures  HPI: 66 y/o male with hx of HTN, EtOH abuse and seizure disorder (primary vs EtOH withdrawal) , PTSD, depression who was brought in by EMS for witnessed seizures at home. History obtained from ED notes as patient quite confused and could not reach his wife at this time. He was noted to have a dilantin level of <2.5 and given a loading dose of 1000 mg dilantin. he is not sure why he's in the hospital and says he drinks everyday unable to quantify the amount.  Patient was also noted to have uncontrolled HTN and whiole attempting to gert out of bed rell on the floor without sustaining any injury.  A head CT done in the ED prior tot he event was unremarkabel. Given seizures adn unsteady gait with elevated BP [facility]. Patient has been admitted to the hospital several times in apst for seizures presumably etoh witdrawal. It appears that he was started on dilantin since January and had an EEG done at that time without any obvious seizure activity. Patient seems to be non compliant with his meds and actively drinking. He denies any pain, headache, nausea, vomiting, dizziness, chest pan , palpitations , SOB , bowel or urinary symptoms. However information is limited due to his confusion. On questioning he knows where he is but thinks its because of his HTN.   Allergies: No Known Allergies  Prior to Admission medications   Medication Sig Start Date End Date Taking? Authorizing Provider  amLODipine (NORVASC) 5 MG tablet Take 1 tablet (5 mg total) by mouth daily. 07/07/11 07/06/12 Yes Alma Concepcion Elk, MD  folic acid (FOLVITE) 1 MG tablet Take 1 tablet (1 mg total) by mouth daily. 07/07/11 07/06/12 Yes Alma Concepcion Elk, MD  lisinopril (PRINIVIL,ZESTRIL) 20 MG tablet Take 0.5 tablets (10 mg total) by mouth daily. 07/07/11  Yes Alison Murray, MD  magnesium oxide (MAG-OX) 400 MG tablet Take 1 tablet  (400 mg total) by mouth 2 (two) times daily. 07/07/11  Yes Alison Murray, MD  Multiple Vitamin (MULITIVITAMIN WITH MINERALS) TABS Take 1 tablet by mouth daily. 07/07/11  Yes Alison Murray, MD  phenytoin (DILANTIN) 100 MG ER capsule Take 1 capsule (100 mg total) by mouth 3 (three) times daily. 07/07/11 07/06/12 Yes Alison Murray, MD  sertraline (ZOLOFT) 100 MG tablet Take 1 tablet (100 mg total) by mouth daily. 07/07/11  Yes Alison Murray, MD  thiamine 100 MG tablet Take 1 tablet (100 mg total) by mouth daily. 07/07/11 07/06/12 Yes Alison Murray, MD  amLODipine (NORVASC) 5 MG tablet Take 1 tablet (5 mg total) by mouth daily. 08/11/11 08/10/12  Angus Seller, PA  lisinopril (PRINIVIL,ZESTRIL) 20 MG tablet Take 1 tablet (20 mg total) by mouth daily. 08/11/11 08/10/12  Angus Seller, PA  omeprazole (PRILOSEC) 20 MG capsule Take 1 capsule (20 mg total) by mouth daily. 08/11/11 08/10/12  Angus Seller, PA  phenytoin (DILANTIN) 100 MG ER capsule Take 1 capsule (100 mg total) by mouth 3 (three) times daily. 08/11/11 08/10/12  Angus Seller, PA    Past Medical History  Diagnosis Date  . Hypertension   . Seizures   . ETOH abuse   . Drug abuse   . PTSD (post-traumatic stress disorder)   . H/O prolonged Q-T interval on ECG   . Polysubstance abuse   . Alcohol abuse  History reviewed. No pertinent past surgical history.  Social History:  reports that he has been smoking.  He does not have any smokeless tobacco history on file. He reports that he drinks alcohol. He reports that he uses illicit drugs (Marijuana).  History reviewed. No pertinent family history.  Review of Systems:  ROS limited due to patient's confusion Constitutional: Denies fever, chills, diaphoresis, appetite change and fatigue.  HEENT: Denies photophobia, eye pain, redness, hearing loss, ear pain, congestion, sore throat, rhinorrhea, sneezing, mouth sores, trouble swallowing, neck pain, neck stiffness and tinnitus.   Respiratory: Denies SOB,  DOE, cough, chest tightness,  and wheezing.   Cardiovascular: Denies chest pain, palpitations and leg swelling.  Gastrointestinal: Denies nausea, vomiting, abdominal pain, diarrhea, constipation, blood in stool and abdominal distention.  Genitourinary: Denies dysuria, urgency, frequency, hematuria, flank pain and difficulty urinating.  Musculoskeletal: Denies myalgias, back pain, joint swelling, arthralgias and gait problem.  Skin: Denies pallor, rash and wound.  Neurological: Denies dizziness, seizures, syncope, weakness, light-headedness, numbness and headaches.  Hematological: Denies adenopathy. Easy bruising, personal or family bleeding history  Psychiatric/Behavioral: Denies suicidal ideation, mood changes, confusion, nervousness, sleep disturbance and agitation   Physical Exam:  Filed Vitals:   08/11/11 0500 08/11/11 0634 08/11/11 0759 08/11/11 0833  BP: 151/107 150/84 147/101 137/93  Pulse:  104 96 105  Temp:   98.9 F (37.2 C)   TempSrc:   Oral   Resp: 21 21 20 20   SpO2:  100% 96% 97%    Constitutional: Vital signs reviewed.  Patient is a thin built elderly male in NAD  Head: Normocephalic and atraumatic Ear: TM normal bilaterally Mouth: Poor dentition, dry oral mucosa Eyes: PERRL, EOMI, conjunctivae normal, No scleral icterus. no nystagmus  Neck: Supple, Trachea midline normal ROM, No JVD, mass, thyromegaly, or carotid bruit present.  Cardiovascular: RRR, S1 normal, S2 normal, no MRG, pulses symmetric and intact bilaterally Pulmonary/Chest: CTAB, no wheezes, rales, or rhonchi Abdominal: Soft. Non-tender, non-distended, bowel sounds are normal, no masses, organomegaly, or guarding present.  GU: no CVA tenderness Musculoskeletal: No joint deformities, erythema, or stiffness, ROM full and no nontender Ext: no edema and no cyanosis, pulses palpable bilaterally (DP and PT) Hematology: no cervical, inginal, or axillary adenopathy.  Neurological: A&O x1, Strenght is normal and  symmetric bilaterally, cranial nerve II-XII are grossly intact, no focal motor deficit, unsteady gait, no tremors Skin: Warm, dry and intact. No rash, cyanosis, or clubbing.    Labs on Admission:  Results for orders placed during the hospital encounter of 08/10/11 (from the past 48 hour(s))  GLUCOSE, CAPILLARY     Status: Abnormal   Collection Time   08/11/11  1:28 AM      Component Value Range Comment   Glucose-Capillary 106 (*) 70 - 99 (mg/dL)    Comment 1 Documented in Chart      Comment 2 Notify RN     CBC     Status: Abnormal   Collection Time   08/11/11  1:29 AM      Component Value Range Comment   WBC 4.4  4.0 - 10.5 (K/uL)    RBC 3.22 (*) 4.22 - 5.81 (MIL/uL)    Hemoglobin 12.7 (*) 13.0 - 17.0 (g/dL)    HCT 16.1 (*) 09.6 - 52.0 (%)    MCV 103.4 (*) 78.0 - 100.0 (fL)    MCH 39.4 (*) 26.0 - 34.0 (pg)    MCHC 37.3 (*) 30.0 - 36.0 (g/dL)    RDW 04.5 (*) 40.9 -  15.5 (%)    Platelets 123 (*) 150 - 400 (K/uL)   DIFFERENTIAL     Status: Normal   Collection Time   08/11/11  1:29 AM      Component Value Range Comment   Neutrophils Relative 56  43 - 77 (%)    Neutro Abs 2.4  1.7 - 7.7 (K/uL)    Lymphocytes Relative 31  12 - 46 (%)    Lymphs Abs 1.4  0.7 - 4.0 (K/uL)    Monocytes Relative 8  3 - 12 (%)    Monocytes Absolute 0.3  0.1 - 1.0 (K/uL)    Eosinophils Relative 5  0 - 5 (%)    Eosinophils Absolute 0.2  0.0 - 0.7 (K/uL)    Basophils Relative 1  0 - 1 (%)    Basophils Absolute 0.0  0.0 - 0.1 (K/uL)   COMPREHENSIVE METABOLIC PANEL     Status: Abnormal   Collection Time   08/11/11  1:29 AM      Component Value Range Comment   Sodium 132 (*) 135 - 145 (mEq/L)    Potassium 3.5  3.5 - 5.1 (mEq/L)    Chloride 95 (*) 96 - 112 (mEq/L)    CO2 22  19 - 32 (mEq/L)    Glucose, Bld 103 (*) 70 - 99 (mg/dL)    BUN 4 (*) 6 - 23 (mg/dL)    Creatinine, Ser 4.58  0.50 - 1.35 (mg/dL)    Calcium 8.8  8.4 - 10.5 (mg/dL)    Total Protein 7.4  6.0 - 8.3 (g/dL)    Albumin 3.7  3.5 - 5.2  (g/dL)    AST 46 (*) 0 - 37 (U/L)    ALT 14  0 - 53 (U/L)    Alkaline Phosphatase 73  39 - 117 (U/L)    Total Bilirubin 0.4  0.3 - 1.2 (mg/dL)    GFR calc non Af Amer >90  >90 (mL/min)    GFR calc Af Amer >90  >90 (mL/min)   LIPASE, BLOOD     Status: Normal   Collection Time   08/11/11  1:29 AM      Component Value Range Comment   Lipase 36  11 - 59 (U/L)   ETHANOL     Status: Normal   Collection Time   08/11/11  1:29 AM      Component Value Range Comment   Alcohol, Ethyl (B) <11  0 - 11 (mg/dL)   PHENYTOIN LEVEL, TOTAL     Status: Abnormal   Collection Time   08/11/11  1:29 AM      Component Value Range Comment   Phenytoin Lvl <2.5 (*) 10.0 - 20.0 (ug/mL)   URINE RAPID DRUG SCREEN (HOSP PERFORMED)     Status: Normal   Collection Time   08/11/11  2:01 AM      Component Value Range Comment   Opiates NONE DETECTED  NONE DETECTED     Cocaine NONE DETECTED  NONE DETECTED     Benzodiazepines NONE DETECTED  NONE DETECTED     Amphetamines NONE DETECTED  NONE DETECTED     Tetrahydrocannabinol NONE DETECTED  NONE DETECTED     Barbiturates NONE DETECTED  NONE DETECTED    URINALYSIS, ROUTINE W REFLEX MICROSCOPIC     Status: Abnormal   Collection Time   08/11/11  2:01 AM      Component Value Range Comment   Color, Urine YELLOW  YELLOW     APPearance  CLEAR  CLEAR     Specific Gravity, Urine 1.018  1.005 - 1.030     pH 7.0  5.0 - 8.0     Glucose, UA NEGATIVE  NEGATIVE (mg/dL)    Hgb urine dipstick NEGATIVE  NEGATIVE     Bilirubin Urine NEGATIVE  NEGATIVE     Ketones, ur NEGATIVE  NEGATIVE (mg/dL)    Protein, ur 30 (*) NEGATIVE (mg/dL)    Urobilinogen, UA 0.2  0.0 - 1.0 (mg/dL)    Nitrite NEGATIVE  NEGATIVE     Leukocytes, UA NEGATIVE  NEGATIVE    URINE MICROSCOPIC-ADD ON     Status: Normal   Collection Time   08/11/11  2:01 AM      Component Value Range Comment   Squamous Epithelial / LPF RARE  RARE     WBC, UA 0-2  <3 (WBC/hpf)    RBC / HPF 0-2  <3 (RBC/hpf)    Bacteria, UA RARE   RARE     Urine-Other MUCOUS PRESENT     MAGNESIUM     Status: Abnormal   Collection Time   08/11/11  9:05 AM      Component Value Range Comment   Magnesium 1.3 (*) 1.5 - 2.5 (mg/dL)     Radiological Exams on Admission: CT HEAD WITHOUT CONTRAST  Technique: Contiguous axial images were obtained from the base of  the skull through the vertex without contrast.  Comparison: 07/05/2011  Findings: Bone windows demonstrate remote right nasal bone fracture  with septal deviation to the right. Left sphenoid sinus mucosal  thickening with chronic osseous thickening. Near complete  opacification of left frontal sinus. Aerated petrous apices. Clear  mastoid air cells.  Soft tissue windows demonstrate advanced cerebral atrophy. Mild  low density in the periventricular white matter likely related to  small vessel disease.Cerebellar atrophy as well. No mass lesion,  hemorrhage, hydrocephalus, acute infarct, intra-axial, or extra-  axial fluid collection.  IMPRESSION:  1. No acute intracranial abnormality.  2. Sinus disease.  3. Age advanced cerebral and cerebellar atrophy. Mild small  vessel ischemic change.   Assessment/Plan   *Seizure, likely etoh withdrawal Admit to tele  patient quite confused and possibly intoxicated vs post ictal Given loading dose of dilantin in ED Monitor neuro checks and seizure precautions Will continue  home dose of dilantin for now. There definitely is a compliance issues given such low levels  i have asked neurology to evaluate patient and further recommend any changes in meds PT eval  Altered mental status Likely post ictal vs etoh intoxication/ withdrawal  follow lytes closely  and replenish head CT with no acute changes Check TSH and B12 levels neurochecks   Alcohol abuse with intoxication/ withdrawal CIWA protocol i will order banana bag.  cont home dose thiamine and folate SW consult  needs counseling on etoh cessation once more oriented    Hypertension, uncontrolled Will resume home meds  add prn hydralazine  Dehydration and hyponatremia  continue banana bag followed by IV fluids Low Na likely secondary to this . Check in am   DVT prophylaxis Sq lovenox  Diet: low sodium  Full code     Time Spent on Admission: 55 minutes  Analeese Andreatta 08/11/2011, 9:45 AM

## 2011-08-11 NOTE — ED Provider Notes (Signed)
Medical screening examination/treatment/procedure(s) were performed by non-physician practitioner and as supervising physician I was immediately available for consultation/collaboration.   [name], MD 08/11/11 2123 

## 2011-08-11 NOTE — ED Notes (Signed)
Attempted to call report to the floor, Receiving RN not available to take report, sts will call me back in 2 minutes. Will follow up

## 2011-08-12 DIAGNOSIS — F10239 Alcohol dependence with withdrawal, unspecified: Secondary | ICD-10-CM

## 2011-08-12 DIAGNOSIS — R569 Unspecified convulsions: Secondary | ICD-10-CM

## 2011-08-12 DIAGNOSIS — I10 Essential (primary) hypertension: Secondary | ICD-10-CM

## 2011-08-12 DIAGNOSIS — F329 Major depressive disorder, single episode, unspecified: Secondary | ICD-10-CM

## 2011-08-12 LAB — BASIC METABOLIC PANEL
GFR calc Af Amer: >90 mL/min (ref >90)
GFR calc non Af Amer: >90 mL/min (ref >90)
Glucose, Bld: 104 mg/dL — ABNORMAL HIGH (ref 70–99)
Potassium: 3.7 mEq/L (ref 3.5–5.1)
Sodium: 131 mEq/L — ABNORMAL LOW (ref 135–145)

## 2011-08-12 LAB — CBC
Hemoglobin: 12.7 g/dL — ABNORMAL LOW (ref 13.0–17.0)
Platelets: 142 10*3/uL — ABNORMAL LOW (ref 150–400)
RBC: 3.33 MIL/uL — ABNORMAL LOW (ref 4.22–5.81)
WBC: 9 10*3/uL (ref 4.0–10.5)

## 2011-08-12 LAB — PHENYTOIN LEVEL, TOTAL: Phenytoin Lvl: 21.7 ug/mL — ABNORMAL HIGH (ref 10.0–20.0)

## 2011-08-12 NOTE — Progress Notes (Signed)
[facility] PROGRESS NOTE  Miguel Hanson YQM:[id number] DOB: Jan 22, 1946 DOA: 08/10/2011 PCP: Provider Not In System  Principal Problem:  *Seizure, convulsive Active Problems:  Seizure  Alcohol abuse  Hypertension  Altered mental status   Assessment/Plan: *Seizure, likely etoh withdrawal   No more seizure activity overnight. Given loading dose of dilantin in ED  Monitor neuro checks and seizure precautions  Will continue home dose of dilantin for now. There definitely is a compliance issues given such low levels  Appreciate neurology recommendations.  PT eval  Altered mental status  Resolved  Likely post ictal vs etoh intoxication/ withdrawal  follow lytes closely and replenish  head CT with no acute changes  Check TSH and B12 levels  neurochecks  Alcohol abuse with intoxication/ withdrawal  CIWA protocol   banana bag on admission. cont home dose thiamine and folate  SW consult  needs counseling on etoh cessation once more oriented  Hypertension, uncontrolled  Will resume home meds  add prn hydralazine  Dehydration and hyponatremia  continue banana bag followed by IV fluids  Low Na likely secondary to this . Check in am  DVT prophylaxis  Sq lovenox    Code Status: FULL Marjorie Smolder, MD  [facility] Pager [phone]  If 7PM-7AM, please contact night-coverage [email or web address] Password [email or web address] 08/12/2011, 3:40 PM   LOS: 2 days   Brief narrative: Miguel Hanson is an 66 y.o. male with a history of ETOH abuse and seizure disorder who was brought in by EMS after having a witnessed seizure at home. Patient unable to give any history. Although patient reports being compliant with medications his Dilantin level on presentation was <2.5. Patient was IV loaded with Dilantin in ED.   Consultants:  neuro  Procedures:  CT HEAD  Antibiotics:  NONE  HPI/Subjective: NO COMPLAINTS.   Objective: Filed Vitals:   08/12/11 0800 08/12/11  1219 08/12/11 1300 08/12/11 1350  BP: 115/66 120/68 119/76 125/72  Pulse: 92 90 95 88  Temp:   98.6 F (37 C)   TempSrc:      Resp:   14   Height:      Weight:      SpO2:   98%     Intake/Output Summary (Last 24 hours) at 08/12/11 1540 Last data filed at 08/11/11 2100  Gross per 24 hour  Intake      0 ml  Output    650 ml  Net   -650 ml    Exam:   General:  ALERT AFEBRILE  Cardiovascular: s1s2 heard RRR  Respiratory: CTAB, no wheezing or rhonchi  Abdomen: soft NT ND BS+  Data Reviewed: Basic Metabolic Panel:  Lab 08/12/11 4401 08/11/11 0905 08/11/11 0129  NA 131* -- 132*  K 3.7 -- 3.5  CL 94* -- 95*  CO2 26 -- 22  GLUCOSE 104* -- 103*  BUN 4* -- 4*  CREATININE 0.73 -- 0.59  CALCIUM 9.0 -- 8.8  MG -- 1.3* --  PHOS -- -- --   Liver Function Tests:  Lab 08/11/11 0905 08/11/11 0129  AST 51* 46*  ALT 15 14  ALKPHOS 81 73  BILITOT 0.6 0.4  PROT 8.0 7.4  ALBUMIN 4.1 3.7    Lab 08/11/11 0129  LIPASE 36  AMYLASE --   No results found for this basename: AMMONIA:5 in the last 168 hours CBC:  Lab 08/12/11 0500 08/11/11 0129  WBC 9.0 4.4  NEUTROABS -- 2.4  HGB 12.7* 12.7*  HCT 35.1* 33.3*  MCV 105.4* 103.4*  PLT 142* 123*   Cardiac Enzymes: No results found for this basename: CKTOTAL:5,CKMB:5,CKMBINDEX:5,TROPONINI:5 in the last 168 hours BNP: No components found with this basename: POCBNP:5 CBG:  Lab 08/11/11 0128  GLUCAP 106*    No results found for this or any previous visit (from the past 240 hour(s)).   Studies: Dg Chest 2 View  08/11/2011  *RADIOLOGY REPORT*  Clinical Data: Seizure.  Hypertension.  Smoker.  CHEST - 2 VIEW  Comparison: 03/08/2011  Findings: Midline trachea.  Normal heart size.  Atherosclerosis in the transverse aorta. No pleural effusion or pneumothorax. Moderate interstitial thickening. Clear lungs.  Numerous upper and mid thoracic compression deformities.  Grossly similar to 05/07/2010.  No canal compromise. Lateral view  degraded by patient arm position.  IMPRESSION: COPD/chronic bronchitis. No acute superimposed process.  Thoracic compression deformities.  Favored to be similar to 05/07/2010.  Original Report Authenticated By: Consuello Bossier, [name]   Ct Head Wo Contrast  08/11/2011  *RADIOLOGY REPORT*  Clinical Data: Seizure.  History of seizures.  Ethanol and drug abuse.  CT HEAD WITHOUT CONTRAST  Technique:  Contiguous axial images were obtained from the base of the skull through the vertex without contrast.  Comparison: 07/05/2011  Findings: Bone windows demonstrate remote right nasal bone fracture with septal deviation to the right.  Left sphenoid sinus mucosal thickening with chronic osseous thickening.  Near complete opacification of left frontal sinus.  Aerated petrous apices. Clear mastoid air cells.  Soft tissue windows demonstrate advanced cerebral atrophy.  Mild low density in the periventricular white matter likely related to small vessel disease.Cerebellar atrophy as well. No  mass lesion, hemorrhage, hydrocephalus, acute infarct, intra-axial, or extra- axial fluid collection.  IMPRESSION:  1. No acute intracranial abnormality. 2.  Sinus disease. 3.  Age advanced cerebral and cerebellar atrophy.  Mild small vessel ischemic change.  Original Report Authenticated By: Consuello Bossier, [name]    Scheduled Meds:   . amLODipine  5 mg Oral Daily  . enoxaparin  40 mg Subcutaneous Q24H  . folic acid  1 mg Oral Daily  . lisinopril  10 mg Oral Daily  . LORazepam  0-4 mg Intravenous Q6H   Followed by  . LORazepam  0-4 mg Intravenous Q12H  . LORazepam  1 mg Oral Q8H  . magnesium oxide  400 mg Oral BID  . multivitamin with minerals  1 tablet Oral Daily  . phenytoin  100 mg Oral TID  . sertraline  100 mg Oral Daily  . sodium chloride  3 mL Intravenous Q12H  . thiamine  100 mg Oral Daily   Or  . thiamine  100 mg Intravenous Daily   Continuous Infusions:      LOS: 2 days

## 2011-08-12 NOTE — Care Management Note (Unsigned)
    Page 1 of 2   08/13/2011     1:17:36 PM   CARE MANAGEMENT NOTE 08/13/2011  Patient:  Miguel Hanson, Miguel Hanson   Account Number:  [id number]  Date Initiated:  08/12/2011  Documentation initiated by:  Lanier Clam  Subjective/Objective Assessment:   ADMITTED W/SEIZURE     Action/Plan:   FROM HOME W/SPOUSE   Anticipated DC Date:  08/13/2011   Anticipated DC Plan:  HOME W HOME HEALTH SERVICES      DC Planning Services  CM consult      Choice offered to / List presented to:  C-1 Patient           Status of service:  In process, will continue to follow Medicare Important Message given?   (If response is "NO", the following Medicare IM given date fields will be blank) Date Medicare IM given:   Date Additional Medicare IM given:    Discharge Disposition:    Per UR Regulation:  Reviewed for med. necessity/level of care/duration of stay  If discussed at Long Length of Stay Meetings, dates discussed:    Comments:  08/13/11 Ami Thornsberry RN,BSN NCM [phone] MD IS ANTICIPATING D/C HOME.PT-HH,RW. [facility] CONTACTED FOR HH,DME,& ?D/C.I HAVE LEFT VM W/[place] [facility](TRANSFER COORDIN) ABOUT ?D/C HOME TODAY,& NOT FOR [facility]ER TO [facility] PER MD.  08/12/11 Suni Jarnagin RN,BSN NCM [phone] [place] [facility] RESPONDED-PATIENT IS SERVICE CONNECTED W/[place].HAVE FAXED PATIENT CONSENT TO TRANSFER.IF MD AGREE THAT PATIENT WILL NEED TO TRANSFER FORMS ARE ON CHART FOR MD TO COMPLETE.SPOKE TO SPOUSE,&DTR IN LAW W/UPDATE.DTR IN LAW VERY CONCERNED ABOUT PRIOR HOSPITAL VISITS FOR SEIZURES,& HX OF ETOH.MD UPDATED. PATIENT STATES HIS PCP IS OUT OF [place] [facility],DR. ROBERT Mosquera.WILL TRY TO F/U.LEFT VM W/[place] [facility])[phone] LAURIE.SINCE VA IS NOT HIS PRIMARY INSURANCE SO TRANSFER TO THE VA WOULD NOT BE ANTICIPATED.IT'S NOTED THAT DTR IN LAW LISA WOULD LIKE PATIENT TO TRANSFER TO  [facility] FOR DETOX.CALLED LISA TEL#[phone],SHE WANTED ME TO CALL HER OUTSIDE OF PATIENT'S RM,RECEIVED PATIENT'S PERMISSION  TO CALL DTR IN LAW.LEFT VM W/MY CALL BACKTEL#.

## 2011-08-12 NOTE — Clinical Documentation Improvement (Signed)
CHANGE MENTAL STATUS DOCUMENTATION CLARIFICATION   THIS DOCUMENT IS NOT A PERMANENT PART OF THE MEDICAL RECORD  TO RESPOND TO THE THIS QUERY, FOLLOW THE INSTRUCTIONS BELOW:  1. If needed, update documentation for the patient's encounter via the notes activity.  2. Access this query again and click edit on the In [employer].  3. After updating, or not, click F2 to complete all highlighted (required) fields concerning your review. Select "additional documentation in the medical record" OR "no additional documentation provided".  4. Click Sign note button.  5. The deficiency will fall out of your In Basket *Please let us know if you are not able to complete this workflow by phone or e-mail (listed below).         08/12/11  Dear Dr. [facility]  In an effort to better capture your patient's severity of illness, reflect appropriate length of stay and utilization of resources, a review of the patient medical record has revealed the following indicators.    Based on your clinical judgment, please clarify and document in a progress note and/or discharge summary the clinical condition associated with the following supporting information:  In responding to this query please exercise your independent judgment.  The fact that a query is asked, does not imply that any particular answer is desired or expected.  08/11/11 H&P.Marland KitchenMarland Kitchen"Altered mental status-Likely post ictal vs etoh intoxication/ withdrawal"... For accurate Dx specificity & severity can noted "AMS" be further clarified w/ cond being eval'd, mon'd & tx'd. Thank you  Possible Clinical Conditions?  Encephalopathy (describe type if known) - Anoxic - Septic - Alcoholic  - Hepatic - Hypertensive - Metabolic -Toxic  Drug induced confusion/delirium Acute confusion Acute delirium  Acute exacerbation of known dementia (indicate type) New diagnosis of Dementia, Alzheimer's, cerebral atherosclerosis  Hyponatremia /  Hypernatremia Poisoning / Overdose Hypoxemia / Hypoxia  Other Condition (please specify)  Cannot Clinically Determine  Supporting Information: Risk Factors:  Signs & Symptoms: 08/11/11: H&P..Marland Kitchen"Altered mental status-Likely post ictal vs etoh intoxication/ withdrawal..."..Marland Kitchen  Diagnostics: Lab: 08/11/11 1:29 AM  Sodium 132 (*)  Potassium 3.5  Chloride 95 (*)  Alcohol, Ethyl (B) <11  Phenytoin Lvl <2.5 (*)  Magnesium 1.3 (*)   Radiology: CT scan see below note  Treatment: 08/11/10 H&P..."... follow lytes closely and replenish, head CT with no acute changes, Check TSH and B12 levels, neurochecks..."  Reviewed: additional documentation in the MEDICAL RECORD [id number]/17/13>per dc summ 08/13/11.ORM  Thank You,  Toribio Harbour, RN, BSN, CCDS Certified Clinical Documentation Specialist Pager: [phone]  Health Information Management [place]

## 2011-08-12 NOTE — Evaluation (Signed)
Physical Therapy Evaluation Patient Details Name: Miguel Hanson MRN: [id number] DOB: 04-14-1945 Today's Date: 08/12/2011 Time: 4098-1191 PT Time Calculation (min): 12 min  PT Assessment / Plan / Recommendation Clinical Impression  66 yo male admitted with seizure, ETOH abuse/withdrawal. Pt unsteady on evaluation however pt states he is fine despite stumbling. Pt reports his wife is in a wheelchair and that he cares for her.    PT Assessment  Patient needs continued PT services    Follow Up Recommendations  Home health PT;Supervision for mobility/OOB    Barriers to Discharge Decreased caregiver support      lEquipment Recommendations   (to be determined)    Recommendations for Other Services OT consult   Frequency Min 3X/week    Precautions / Restrictions Precautions Precautions: Fall Restrictions Weight Bearing Restrictions: No   Pertinent Vitals/Pain       Mobility  Bed Mobility Bed Mobility: Sit to Supine Supine to Sit: 6: Modified independent (Device/Increase time) Transfers Transfers: Sit to Stand;Stand to Sit Sit to Stand: 4: Min assist Stand to Sit: 4: Min assist Details for Transfer Assistance: VCs safety. Pt slightly impulsive. A to stabilize while standing at EOB-pt unsteady Ambulation/Gait Ambulation/Gait Assistance: 4: Min assist Ambulation Distance (Feet): 350 Feet Assistive device: None Ambulation/Gait Assistance Details: A to stabilize throughout ambulation. May need to assess ambulation with RW on next session. Pt unsteady with mulitple instances of stumbling/wavering.  Gait Pattern: Decreased step length - left;Decreased step length - right;Decreased stride length;Step-through pattern    Exercises     PT Diagnosis: Difficulty walking;Abnormality of gait  PT Problem List: Decreased balance;Decreased mobility;Decreased knowledge of use of DME PT Treatment Interventions: DME instruction;Gait training;Functional mobility training;Therapeutic  activities;Therapeutic exercise;Balance training;Patient/family education   PT Goals Acute Rehab PT Goals PT Goal Formulation: With patient Time For Goal Achievement: 08/19/11 Potential to Achieve Goals: Good Pt will go Supine/Side to Sit: with modified independence PT Goal: Supine/Side to Sit - Progress: Goal set today Pt will go Sit to Supine/Side: with modified independence PT Goal: Sit to Supine/Side - Progress: Goal set today Pt will go Sit to Stand: with supervision PT Goal: Sit to Stand - Progress: Goal set today Pt will Ambulate: with supervision;>150 feet;with least restrictive assistive device PT Goal: Ambulate - Progress: Goal set today  Visit Information  Last PT Received On: 08/12/11 Assistance Needed: +1    Subjective Data  Subjective: "Let's walk" Patient Stated Goal: Home   Prior Functioning  Home Living Lives With: Spouse (grandson) Type of Home: House Home Access: Level entry Additional Comments: pt does not have any DME but wife does Prior Function Level of Independence: Independent Communication Communication: No difficulties    Cognition  Overall Cognitive Status: Impaired Area of Impairment: Awareness of deficits;Safety/judgement Arousal/Alertness: Awake/alert Orientation Level: Appears intact for tasks assessed Behavior During Session: [facility] for tasks performed Safety/Judgement: Decreased awareness of safety precautions;Decreased awareness of need for assistance;Decreased safety judgement for tasks assessed    Extremity/Trunk Assessment Right Lower Extremity Assessment RLE ROM/Strength/Tone: [facility] for tasks assessed Left Lower Extremity Assessment LLE ROM/Strength/Tone: [facility] for tasks assessed Trunk Assessment Trunk Assessment: Normal   Balance    End of Session PT - End of Session Equipment Utilized During Treatment: Gait belt Activity Tolerance: Patient tolerated treatment well Patient left: in bed;with bed alarm set   Rebeca Alert  [facility] 08/12/2011, 12:40 PM [phone]

## 2011-08-13 DIAGNOSIS — F329 Major depressive disorder, single episode, unspecified: Secondary | ICD-10-CM

## 2011-08-13 DIAGNOSIS — R569 Unspecified convulsions: Secondary | ICD-10-CM

## 2011-08-13 DIAGNOSIS — F10239 Alcohol dependence with withdrawal, unspecified: Secondary | ICD-10-CM

## 2011-08-13 DIAGNOSIS — I10 Essential (primary) hypertension: Secondary | ICD-10-CM

## 2011-08-13 MED ORDER — MAGNESIUM OXIDE 400 (241.3 MG) MG PO TABS: 400.0000 mg | ORAL_TABLET | Freq: Two times a day (BID) | ORAL | Status: DC

## 2011-08-13 MED ORDER — OMEPRAZOLE 20 MG PO CPDR: 20.0000 mg | DELAYED_RELEASE_CAPSULE | Freq: Every day | ORAL | Status: DC

## 2011-08-13 MED ORDER — AMLODIPINE BESYLATE 5 MG PO TABS: 5.0000 mg | ORAL_TABLET | Freq: Every day | ORAL | Status: DC

## 2011-08-13 MED ORDER — PHENYTOIN SODIUM EXTENDED 100 MG PO CAPS: 100.0000 mg | ORAL_CAPSULE | Freq: Three times a day (TID) | ORAL | Status: DC

## 2011-08-13 MED ORDER — THIAMINE HCL 100 MG PO TABS: 100.0000 mg | ORAL_TABLET | Freq: Every day | ORAL | Status: DC

## 2011-08-13 NOTE — Discharge Summary (Signed)
Physician Discharge Summary  Miguel Hanson ZOX:[id number] DOB: 05/21/1945 DOA: 08/10/2011  PCP: Provider Not In System  Admit date: 08/10/2011 Discharge date: 08/13/2011  Discharge Diagnoses:  Principal Problem:  *Seizure, convulsive Active Problems:  Seizure  Alcohol abuse  Hypertension  Altered mental status   Discharge Condition: stable  Diet recommendation: Low sodium diet  History of present illness:    Hospital Course:   Seizure:  from likely ETOH withdrawal . Neurology consult was obtained. Dilantin level was sub therapeutic, probably from non compliance to medications. Given loading dose of dilantin in ED. No more seizure activity during the hospitalization.  Repeat Dilantin levels wee therapeutic.  Will continue home dose of dilantin for now. There definitely is a compliance issues given such low levels. Educated him on compliance to medications. Recommend checking Dilantin Level in one week at PCP office.   Altered mental status : secondary to seizures and metabolic encephalopathy and ETOH intoxication and questionable ETOH withdrawal. Resolved after admission.  head CT with no acute changes    Alcohol abuse with intoxication/ withdrawal : CIWA protocol was put on and banana bag on admission.  Continued  home dose thiamine and folate   counseled on etoh cessation  Hypertension, uncontrolled  Resumed home medications.   Dehydration and hyponatremia  Hyponatremia improved with IV fluids.  Recommend checking BMP in one week.    Procedures:  CT HEAD  CXR  Consultations:  Neurology   Discharge Exam: Filed Vitals:   08/13/11 1300  BP: 123/76  Pulse: 82  Temp: 98.3 F (36.8 C)  Resp: 17   Filed Vitals:   08/12/11 2207 08/13/11 0557 08/13/11 1022 08/13/11 1300  BP: 134/83 112/80 118/78 123/76  Pulse: 84 87  82  Temp: 98.2 F (36.8 C) 98.6 F (37 C)  98.3 F (36.8 C)  TempSrc: Oral Oral    Resp: 14 16  17   Height:      Weight:      SpO2: 98%  97%  97%   General: alert afebrile comfortable.  Cardiovascular: S1S2  RRR  Respiratory: CTAB. No wheezing or rhonchi.  Discharge Instructions  Discharge Orders    Future Orders Please Complete By Expires   Diet - low sodium heart healthy      Discharge instructions      Comments:   Follow up with your PCP in one week and check DILANTIN LEVEL at the time of the visit.   Activity as tolerated - No restrictions      Driving Restrictions      Comments:   No driving, as we have clearly indicated that you have been non compliant to anti seizure medications.  Education about compliance to medications given.   Other Restrictions      Comments:   No alcohol intake as it might interfere with your medications.     Medication List  As of 08/13/2011  3:38 PM   TAKE these medications         amLODipine 5 MG tablet   Commonly known as: NORVASC   Take 1 tablet (5 mg total) by mouth daily.      amLODipine 5 MG tablet   Commonly known as: NORVASC   Take 1 tablet (5 mg total) by mouth daily.      folic acid 1 MG tablet   Commonly known as: FOLVITE   Take 1 tablet (1 mg total) by mouth daily.      lisinopril 20 MG tablet   Commonly known as:  PRINIVIL,ZESTRIL   Take 0.5 tablets (10 mg total) by mouth daily.      lisinopril 20 MG tablet   Commonly known as: PRINIVIL,ZESTRIL   Take 1 tablet (20 mg total) by mouth daily.      magnesium oxide 400 MG tablet   Commonly known as: MAG-OX   Take 1 tablet (400 mg total) by mouth 2 (two) times daily.      multivitamin with minerals Tabs   Take 1 tablet by mouth daily.      omeprazole 20 MG capsule   Commonly known as: PRILOSEC   Take 1 capsule (20 mg total) by mouth daily.      phenytoin 100 MG ER capsule   Commonly known as: DILANTIN   Take 1 capsule (100 mg total) by mouth 3 (three) times daily.      phenytoin 100 MG ER capsule   Commonly known as: DILANTIN   Take 1 capsule (100 mg total) by mouth 3 (three) times daily.       sertraline 100 MG tablet   Commonly known as: ZOLOFT   Take 1 tablet (100 mg total) by mouth daily.      thiamine 100 MG tablet   Take 1 tablet (100 mg total) by mouth daily.           Follow-up Information    Follow up with Your Doctor.      Follow up with [facility].      Follow up with Juanda Bond [facility]. (CALL FOR APPT)    Contact information:   [phone]      Follow up with [facility] (CALL FOR APPT)    Contact information:   [phone]          The results of significant diagnostics from this hospitalization (including imaging, microbiology, ancillary and laboratory) are listed below for reference.    Significant Diagnostic Studies: Dg Chest 2 View  08/11/2011  *RADIOLOGY REPORT*  Clinical Data: Seizure.  Hypertension.  Smoker.  CHEST - 2 VIEW  Comparison: 03/08/2011  Findings: Midline trachea.  Normal heart size.  Atherosclerosis in the transverse aorta. No pleural effusion or pneumothorax. Moderate interstitial thickening. Clear lungs.  Numerous upper and mid thoracic compression deformities.  Grossly similar to 05/07/2010.  No canal compromise. Lateral view degraded by patient arm position.  IMPRESSION: COPD/chronic bronchitis. No acute superimposed process.  Thoracic compression deformities.  Favored to be similar to 05/07/2010.  Original Report Authenticated By: Consuello Bossier, [name]   Ct Head Wo Contrast  08/11/2011  *RADIOLOGY REPORT*  Clinical Data: Seizure.  History of seizures.  Ethanol and drug abuse.  CT HEAD WITHOUT CONTRAST  Technique:  Contiguous axial images were obtained from the base of the skull through the vertex without contrast.  Comparison: 07/05/2011  Findings: Bone windows demonstrate remote right nasal bone fracture with septal deviation to the right.  Left sphenoid sinus mucosal thickening with chronic osseous thickening.  Near complete opacification of left frontal sinus.  Aerated petrous apices. Clear  mastoid air cells.  Soft tissue windows demonstrate advanced cerebral atrophy.  Mild low density in the periventricular white matter likely related to small vessel disease.Cerebellar atrophy as well. No  mass lesion, hemorrhage, hydrocephalus, acute infarct, intra-axial, or extra- axial fluid collection.  IMPRESSION:  1. No acute intracranial abnormality. 2.  Sinus disease. 3.  Age advanced cerebral and cerebellar atrophy.  Mild small vessel ischemic change.  Original Report Authenticated By: Consuello Bossier, [name]  Microbiology: No results found for this or any previous visit (from the past 240 hour(s)).   Labs: Basic Metabolic Panel:  Lab 08/12/11 1610 08/11/11 0905 08/11/11 0129  NA 131* -- 132*  K 3.7 -- 3.5  CL 94* -- 95*  CO2 26 -- 22  GLUCOSE 104* -- 103*  BUN 4* -- 4*  CREATININE 0.73 -- 0.59  CALCIUM 9.0 -- 8.8  MG -- 1.3* --  PHOS -- -- --   Liver Function Tests:  Lab 08/11/11 0905 08/11/11 0129  AST 51* 46*  ALT 15 14  ALKPHOS 81 73  BILITOT 0.6 0.4  PROT 8.0 7.4  ALBUMIN 4.1 3.7    Lab 08/11/11 0129  LIPASE 36  AMYLASE --   No results found for this basename: AMMONIA:5 in the last 168 hours CBC:  Lab 08/12/11 0500 08/11/11 0129  WBC 9.0 4.4  NEUTROABS -- 2.4  HGB 12.7* 12.7*  HCT 35.1* 33.3*  MCV 105.4* 103.4*  PLT 142* 123*   Cardiac Enzymes: No results found for this basename: CKTOTAL:5,CKMB:5,CKMBINDEX:5,TROPONINI:5 in the last 168 hours BNP: BNP (last 3 results) No results found for this basename: PROBNP:3 in the last 8760 hours CBG:  Lab 08/11/11 0128  GLUCAP 106*    Time coordinating discharge: 65 minutes.  SignedKathlen Mody, MD  [facility] 08/13/2011, 3:38 PM

## 2011-08-13 NOTE — Progress Notes (Signed)
Patient discharged to home in accompaniment of his sister in law. Stable upon discharge. Verbalizes understanding of need to take meds as prescribed, seizure precautions, not to drive, to discontinue ETOH use as well as follow up instructions. Ginny Forth

## 2011-08-13 NOTE — Progress Notes (Signed)
MD notified that pt stated that he "falls all the time at home" and is the primary caretaker of his wife who is wheelchair bound and has MS.

## 2011-08-13 NOTE — Progress Notes (Addendum)
Physical Therapy Treatment Patient Details Name: Miguel Hanson MRN: [id number] DOB: Nov 06, 1945 Today's Date: 08/13/2011 Time: 6213-0865 PT Time Calculation (min): 10 min  PT Assessment / Plan / Recommendation Comments on Treatment Session  Improved stability with use of RW during ambulation. Pt continues to demonstrate impaired balance. Continue to recommend HHPT for general strength and balance training. Also pt will benefit from RW use.     Follow Up Recommendations  Home health PT; Supervision with mobility/OOB    Barriers to Discharge        Equipment Recommendations  Rolling walker with 5" wheels    Recommendations for Other Services    Frequency Min 3X/week   Plan Discharge plan remains appropriate    Precautions / Restrictions Precautions Precautions: Fall Restrictions Weight Bearing Restrictions: No   Pertinent Vitals/Pain     Mobility  Bed Mobility Bed Mobility: Supine to Sit;Sit to Supine Supine to Sit: 6: Modified independent (Device/Increase time) Sit to Supine: 6: Modified independent (Device/Increase time) Transfers Transfers: Sit to Stand;Stand to Sit Sit to Stand: 4: Min assist Stand to Sit: 4: Min guard Details for Transfer Assistance: VCs safety. Pt unsteady with initial standing. Assist needed to prevent LOB Ambulation/Gait Ambulation/Gait Assistance: 4: Min guard Ambulation Distance (Feet): 350 Feet Assistive device: Rolling walker Ambulation/Gait Assistance Details: Improved stability with use of RW.  Gait Pattern: Step-through pattern    Exercises General Exercises - Lower Extremity Hip Flexion/Marching: AROM;Both;Standing (12 reps) Heel Raises: AROM;Both;Standing (12 reps)   PT Diagnosis:    PT Problem List:   PT Treatment Interventions:     PT Goals Acute Rehab PT Goals PT Goal: Supine/Side to Sit - Progress: Met PT Goal: Sit to Supine/Side - Progress: Met PT Goal: Sit to Stand - Progress: Progressing toward goal PT Goal: Ambulate -  Progress: Progressing toward goal  Visit Information  Last PT Received On: 08/13/11 Assistance Needed: +1    Subjective Data  Subjective: "I've been in the bed" Patient Stated Goal: Home   Cognition  Overall Cognitive Status: Impaired Area of Impairment: Awareness of deficits;Safety/judgement Arousal/Alertness: Awake/alert Orientation Level: Appears intact for tasks assessed Behavior During Session: [facility] for tasks performed Safety/Judgement: Decreased awareness of safety precautions;Decreased awareness of need for assistance    Balance     End of Session PT - End of Session Equipment Utilized During Treatment: Gait belt Activity Tolerance: Patient tolerated treatment well Patient left: in bed;with call bell/phone within reach;with bed alarm set    Rebeca Alert [facility] 08/13/2011, 12:19 PM [phone]

## 2011-09-15 ENCOUNTER — Emergency Department (HOSPITAL_COMMUNITY)
Admission: EM | Admit: 2011-09-15 | Discharge: 2011-09-15 | Disposition: A | Discharge status: Home / Self Care | Payer: Medicare Other | Attending: Emergency Medicine | Admitting: Emergency Medicine

## 2011-09-15 ENCOUNTER — Encounter (HOSPITAL_COMMUNITY): Payer: Self-pay | Admitting: Emergency Medicine

## 2011-09-15 DIAGNOSIS — Z91199 Patient's noncompliance with other medical treatment and regimen due to unspecified reason: Secondary | ICD-10-CM | POA: Insufficient documentation

## 2011-09-15 DIAGNOSIS — Z9119 Patient's noncompliance with other medical treatment and regimen: Secondary | ICD-10-CM | POA: Insufficient documentation

## 2011-09-15 DIAGNOSIS — F1010 Alcohol abuse, uncomplicated: Secondary | ICD-10-CM | POA: Insufficient documentation

## 2011-09-15 DIAGNOSIS — Z9114 Patient's other noncompliance with medication regimen: Secondary | ICD-10-CM

## 2011-09-15 DIAGNOSIS — R569 Unspecified convulsions: Secondary | ICD-10-CM | POA: Insufficient documentation

## 2011-09-15 DIAGNOSIS — F17200 Nicotine dependence, unspecified, uncomplicated: Secondary | ICD-10-CM | POA: Insufficient documentation

## 2011-09-15 DIAGNOSIS — Z79899 Other long term (current) drug therapy: Secondary | ICD-10-CM | POA: Insufficient documentation

## 2011-09-15 DIAGNOSIS — I10 Essential (primary) hypertension: Secondary | ICD-10-CM | POA: Insufficient documentation

## 2011-09-15 LAB — BASIC METABOLIC PANEL
Chloride: 100 mEq/L (ref 96–112)
GFR calc Af Amer: >90 mL/min (ref >90)
Potassium: 4 mEq/L (ref 3.5–5.1)

## 2011-09-15 LAB — CBC
HCT: 34.1 % — ABNORMAL LOW (ref 39.0–52.0)
Platelets: 145 10*3/uL — ABNORMAL LOW (ref 150–400)
RDW: 17.5 % — ABNORMAL HIGH (ref 11.5–15.5)
WBC: 8 10*3/uL (ref 4.0–10.5)

## 2011-09-15 LAB — ETHANOL: Alcohol, Ethyl (B): <11 mg/dL (ref 0–11)

## 2011-09-15 MED ORDER — ONDANSETRON HCL 4 MG/2ML IJ SOLN
INTRAMUSCULAR | Status: AC
  Administered 2011-09-15: 4 mg
  Filled 2011-09-15: qty 2

## 2011-09-15 MED ORDER — ONDANSETRON 8 MG PO TBDP: 8.0000 mg | ORAL_TABLET | Freq: Three times a day (TID) | ORAL | Status: DC | PRN

## 2011-09-15 MED ORDER — LORAZEPAM 2 MG/ML IJ SOLN: 1.0000 mg | Freq: Four times a day (QID) | INTRAMUSCULAR | Status: DC | PRN

## 2011-09-15 MED ORDER — ADULT MULTIVITAMIN W/MINERALS CH
1.0000 | ORAL_TABLET | Freq: Every day | ORAL | Status: DC
  Administered 2011-09-15: 1 via ORAL
  Filled 2011-09-15: qty 1

## 2011-09-15 MED ORDER — VITAMIN B-1 100 MG PO TABS
100.0000 mg | ORAL_TABLET | Freq: Every day | ORAL | Status: DC
  Administered 2011-09-15: 100 mg via ORAL
  Filled 2011-09-15: qty 1

## 2011-09-15 MED ORDER — FOLIC ACID 1 MG PO TABS
1.0000 mg | ORAL_TABLET | Freq: Every day | ORAL | Status: DC
  Administered 2011-09-15: 1 mg via ORAL
  Filled 2011-09-15: qty 1

## 2011-09-15 MED ORDER — THIAMINE HCL 100 MG/ML IJ SOLN: 100.0000 mg | Freq: Every day | INTRAMUSCULAR | Status: DC

## 2011-09-15 MED ORDER — SODIUM CHLORIDE 0.9 % IV SOLN
1000.0000 mg | Freq: Once | INTRAVENOUS | Status: AC
  Administered 2011-09-15: 1000 mg via INTRAVENOUS
  Filled 2011-09-15 (×2): qty 20

## 2011-09-15 MED ORDER — LORAZEPAM 1 MG PO TABS
1.0000 mg | ORAL_TABLET | Freq: Four times a day (QID) | ORAL | Status: DC | PRN
  Administered 2011-09-15: 1 mg via ORAL
  Filled 2011-09-15: qty 1

## 2011-09-15 MED ORDER — LORAZEPAM 1 MG PO TABS: 1.0000 mg | ORAL_TABLET | Freq: Four times a day (QID) | ORAL | Status: AC | PRN

## 2011-09-15 MED ORDER — PHENYTOIN 50 MG PO CHEW: 400.0000 mg | CHEWABLE_TABLET | Freq: Three times a day (TID) | ORAL | Status: DC

## 2011-09-15 MED ORDER — SODIUM CHLORIDE 0.9 % IV BOLUS (SEPSIS)
1000.0000 mL | Freq: Once | INTRAVENOUS | Status: AC
  Administered 2011-09-15: 1000 mL via INTRAVENOUS

## 2011-09-15 NOTE — ED Provider Notes (Signed)
I saw and evaluated the patient, reviewed the resident's note and I agree with the findings and plan.  Seizure day may represent breakthrough seizure secondary to noncompliance with his anticonvulsant medications.  The patient was loaded with IV Dilantin in the emergency department.  At time of discharge the patient became more tachycardic and hypertensive as well as tremulous and appeared to be developing alcohol withdrawal.  His seizure may represent alcohol withdrawal but the patient presented to not have any other clinical signs to suggest alcohol withdrawal.  The patient was given a dose of Ativan and a recommendation was made to admit the patient and the hospital for 24-hour observation and ongoing treatment of what appeared to be alcohol withdrawal.  Despite this recommendation the patient is adamant that he would like to be discharged home.  He understands the risk of discharge home which includes alcohol withdrawal, seizure, and death.  He understands these risks and is willing to take that risk.  He understands that he can return the emergency department if he changes his mind and that he can return to the emergency department for any new or worsening symptoms.  He was discharged home with a short course of Ativan to be taken when necessary withdrawal symptoms.  He also has his Dilantin at home and states he will be compliant with his Dilantin.  The patient seems to have the capacity to make his own decisions.  He understands the risks and does have some mental status at this time.  There is no indication for involuntary commitment.   Lyanne Co, MD 09/15/11 1125

## 2011-09-15 NOTE — ED Provider Notes (Signed)
History     CSN: [id number]  Arrival date & time 09/15/11  0645   None     Chief Complaint  Patient presents with  . Seizures    (Consider location/radiation/quality/duration/timing/severity/associated sxs/prior treatment) HPI   Miguel Hanson is a 66 year old gentleman with alcoholism who presents this morning after an apparent seizure by witnessed his wife. His wife is not present and the patient has not recollection of the events. He does not remember what he did yesterday, but says he "cannot remember the last time I drank that much." He is a daily alcohol drinker and states that he doesn't take medications on a days when he drinks. He know was prescribed a seizure medication that he filled from the pharmacy but has not started taking. He has approximately 4 episodes of seizures after drinking during 2013, for which he has been evaluated in the hospital. The patient denies any chest pain, shortness of breath, musculoskeletal pain, lacerations, and bruises.   Past Medical History  Diagnosis Date  . Hypertension   . Seizures   . ETOH abuse   . Drug abuse   . PTSD (post-traumatic stress disorder)   . H/O prolonged Q-T interval on ECG   . Polysubstance abuse   . Alcohol abuse     History reviewed. No pertinent past surgical history.  History reviewed. No pertinent family history.  History  Substance Use Topics  . Smoking status: Current Some Day Smoker -- 0.5 packs/day for 40 years    Types: Cigarettes  . Smokeless tobacco: Not on file  . Alcohol Use: Yes     abuse/binge      Review of Systems  Constitutional: Negative.   HENT: Negative.   Eyes: Negative.   Respiratory: Negative.   Cardiovascular: Negative.   Gastrointestinal: Negative.   Genitourinary: Negative.   Musculoskeletal: Negative.   Neurological: Negative.   Hematological: Negative.   Psychiatric/Behavioral: Negative.     Allergies  Review of patient's allergies indicates no known  allergies.  Home Medications   Current Outpatient Rx  Name Route Sig Dispense Refill  . AMLODIPINE BESYLATE 5 MG PO TABS Oral Take 1 tablet (5 mg total) by mouth daily. 30 tablet 0  . FOLIC ACID 1 MG PO TABS Oral Take 1 tablet (1 mg total) by mouth daily. 30 tablet 0  . LISINOPRIL 20 MG PO TABS Oral Take 20 mg by mouth daily.    Marland Kitchen MAGNESIUM OXIDE 400 (241.3 MG) MG PO TABS Oral Take 400 mg by mouth 2 (two) times daily.    . ADULT MULTIVITAMIN W/MINERALS CH Oral Take 1 tablet by mouth daily. 30 tablet 0  . OMEPRAZOLE 20 MG PO CPDR Oral Take 20 mg by mouth daily.    Marland Kitchen PHENYTOIN SODIUM EXTENDED 100 MG PO CAPS Oral Take 1 capsule (100 mg total) by mouth 3 (three) times daily. 35 capsule 0  . SERTRALINE HCL 100 MG PO TABS Oral Take 1 tablet (100 mg total) by mouth daily. 30 tablet 0  . THIAMINE HCL 100 MG PO TABS Oral Take 100 mg by mouth daily.    Marland Kitchen LORAZEPAM 1 MG PO TABS Oral Take 1 tablet (1 mg total) by mouth every 6 (six) hours as needed for anxiety. 15 tablet 0    BP 150/89  Pulse 108  Temp 98.7 F (37.1 C)  Resp 13  Ht 5\' 9"  (1.753 m)  Wt 124 lb (56.246 kg)  BMI 18.31 kg/m2  SpO2 96%  Physical Exam  Constitutional: He appears cachectic. He is cooperative.  Non-toxic appearance.  HENT:  Head: Normocephalic.  Right Ear: Tympanic membrane normal. No hemotympanum.  Left Ear: Tympanic membrane normal. No hemotympanum.  Nose: Nose normal.  Mouth/Throat: Uvula is midline. Mucous membranes are dry.  Eyes: Conjunctivae and EOM are normal. Pupils are equal, round, and reactive to light.       Pupils 2 mm bilaterally   Neck: Trachea normal and normal range of motion.  Cardiovascular: Regular rhythm and normal pulses.  Tachycardia present.   Pulmonary/Chest: Effort normal and breath sounds normal.  Abdominal: Soft. Normal appearance. There is no tenderness.  Neurological: He is alert. He has normal strength and normal reflexes. No cranial nerve deficit or sensory deficit. GCS eye  subscore is 4. GCS verbal subscore is 4. GCS motor subscore is 6.  Skin: Skin is warm, dry and intact. No abrasion and no rash noted.    ED Course  Procedures (including critical care time)  Labs Reviewed  CBC - Abnormal; Notable for the following:    RBC 3.45 (*)     Hemoglobin 12.9 (*)     HCT 34.1 (*)     MCH 37.4 (*)     MCHC 37.8 (*)     RDW 17.5 (*)     Platelets 145 (*)     All other components within normal limits  BASIC METABOLIC PANEL - Abnormal; Notable for the following:    Sodium 134 (*)     CO2 18 (*)     Glucose, Bld 105 (*)     All other components within normal limits  PHENYTOIN LEVEL, TOTAL - Abnormal; Notable for the following:    Phenytoin Lvl <2.5 (*)     All other components within normal limits  ETHANOL   No results found.   1. Seizure   2. H/O medication noncompliance       MDM  66 year old man with chronic alcoholism and numerous alcohol withdrawal seizures who presents with a similar episode today. He was found to have a subtherapuetic dilantin level. Therefore, the etiology of the seizure could be alcohol withdrawal versus untreated seizure disorder. He was given a loading dose of dilantin and IV fluids. His withdrawal symptoms worsened throughout the morning, and he required 1 mg of ativan. Thereafter, he was still tachycardic, tremulous, and hypertensive with a CIWA score of 9. The patient counseled that he needed inpatient admission for alcohol withdrawal treatment. He refused on account of needing to care for his disabled wife. He was explained the risks of alcohol by Dr. Patria Mane and acknowledged his desire to leave in spite of the risks. He was prescribed Ativan to take at home PRN for tremulousness. He was also encouraged to return to the hospital if he felt like he needed to.         Garnetta Buddy, MD 09/15/11 1121

## 2011-09-15 NOTE — ED Notes (Signed)
ZOX:[id number]<AV> Expected date:09/15/11<BR> Expected time: 6:31 AM<BR> Means of arrival:Ambulance<BR> Comments:<BR> seizure

## 2011-09-15 NOTE — ED Notes (Signed)
As per EMS wife stated pt had seizure lasting . LOC. Pt was confused when EMS arrived ,pt was incont of urine

## 2011-09-15 NOTE — ED Notes (Signed)
Dilantin did not arrive, pharmacy confirmed that it was sent. Pharmacy will remix and tube again

## 2011-11-13 ENCOUNTER — Encounter (HOSPITAL_COMMUNITY): Payer: Self-pay | Admitting: *Deleted

## 2011-11-13 ENCOUNTER — Emergency Department (HOSPITAL_COMMUNITY)
Admission: EM | Admit: 2011-11-13 | Discharge: 2011-11-13 | Disposition: A | Discharge status: Home / Self Care | Payer: Medicare Other | Attending: Emergency Medicine | Admitting: Emergency Medicine

## 2011-11-13 DIAGNOSIS — S335XXA Sprain of ligaments of lumbar spine, initial encounter: Secondary | ICD-10-CM | POA: Insufficient documentation

## 2011-11-13 DIAGNOSIS — I10 Essential (primary) hypertension: Secondary | ICD-10-CM | POA: Insufficient documentation

## 2011-11-13 DIAGNOSIS — F1010 Alcohol abuse, uncomplicated: Secondary | ICD-10-CM | POA: Insufficient documentation

## 2011-11-13 DIAGNOSIS — F17200 Nicotine dependence, unspecified, uncomplicated: Secondary | ICD-10-CM | POA: Insufficient documentation

## 2011-11-13 DIAGNOSIS — X58XXXA Exposure to other specified factors, initial encounter: Secondary | ICD-10-CM | POA: Insufficient documentation

## 2011-11-13 DIAGNOSIS — S39012A Strain of muscle, fascia and tendon of lower back, initial encounter: Secondary | ICD-10-CM

## 2011-11-13 MED ORDER — IBUPROFEN 400 MG PO TABS
400.0000 mg | ORAL_TABLET | Freq: Once | ORAL | Status: AC
  Administered 2011-11-13: 400 mg via ORAL
  Filled 2011-11-13: qty 1

## 2011-11-13 MED ORDER — METHOCARBAMOL 500 MG PO TABS
1000.0000 mg | ORAL_TABLET | Freq: Once | ORAL | Status: AC
  Administered 2011-11-13: 1000 mg via ORAL
  Filled 2011-11-13: qty 2

## 2011-11-13 MED ORDER — IBUPROFEN 400 MG PO TABS: 400.0000 mg | ORAL_TABLET | Freq: Four times a day (QID) | ORAL | Status: AC | PRN

## 2011-11-13 MED ORDER — METHOCARBAMOL 500 MG PO TABS: 500.0000 mg | ORAL_TABLET | Freq: Two times a day (BID) | ORAL | Status: AC

## 2011-11-13 NOTE — ED Notes (Signed)
Pt reports he was lifting wife into the car last Saturday and has had lower back pain since. Ambulatory without difficulty. Reports most pain is at night. Reports no relief with home meds.

## 2011-11-13 NOTE — ED Provider Notes (Addendum)
History     CSN: [id number]  Arrival date & time 11/13/11  1200   First MD Initiated Contact with Patient 11/13/11 1223      Chief Complaint  Patient presents with  . Back Pain    (Consider location/radiation/quality/duration/timing/severity/associated sxs/prior treatment) HPI Pt states he was lifting last Saturday and is complaining of low back pain since. No incontinence, fever, weakness, numbness Past Medical History  Diagnosis Date  . Hypertension   . Seizures   . ETOH abuse   . Drug abuse   . PTSD (post-traumatic stress disorder)   . H/O prolonged Q-T interval on ECG   . Polysubstance abuse   . Alcohol abuse     History reviewed. No pertinent past surgical history.  History reviewed. No pertinent family history.  History  Substance Use Topics  . Smoking status: Current Some Day Smoker -- 0.5 packs/day for 40 years    Types: Cigarettes  . Smokeless tobacco: Not on file  . Alcohol Use: Yes     abuse/binge      Review of Systems  Constitutional: Negative for fever and chills.  Gastrointestinal: Negative for nausea, vomiting and abdominal pain.  Genitourinary: Negative for dysuria, flank pain and difficulty urinating.  Musculoskeletal: Positive for myalgias and back pain.  Skin: Negative for rash and wound.  Neurological: Negative for weakness and numbness.    Allergies  Review of patient's allergies indicates no known allergies.  Home Medications   Current Outpatient Rx  Name Route Sig Dispense Refill  . AMLODIPINE BESYLATE 5 MG PO TABS Oral Take 1 tablet (5 mg total) by mouth daily. 30 tablet 0  . FOLIC ACID 1 MG PO TABS Oral Take 1 tablet (1 mg total) by mouth daily. 30 tablet 0  . IBUPROFEN 400 MG PO TABS Oral Take 1 tablet (400 mg total) by mouth every 6 (six) hours as needed for pain. 30 tablet 0  . LISINOPRIL 20 MG PO TABS Oral Take 20 mg by mouth daily.    Marland Kitchen MAGNESIUM OXIDE 400 (241.3 MG) MG PO TABS Oral Take 400 mg by mouth 2 (two) times  daily.    Marland Kitchen METHOCARBAMOL 500 MG PO TABS Oral Take 1 tablet (500 mg total) by mouth 2 (two) times daily. 20 tablet 0  . ADULT MULTIVITAMIN W/MINERALS CH Oral Take 1 tablet by mouth daily. 30 tablet 0  . OMEPRAZOLE 20 MG PO CPDR Oral Take 20 mg by mouth daily.    Marland Kitchen PHENYTOIN SODIUM EXTENDED 100 MG PO CAPS Oral Take 1 capsule (100 mg total) by mouth 3 (three) times daily. 35 capsule 0  . SERTRALINE HCL 100 MG PO TABS Oral Take 1 tablet (100 mg total) by mouth daily. 30 tablet 0  . THIAMINE HCL 100 MG PO TABS Oral Take 100 mg by mouth daily.      BP 155/106  Pulse 102  Temp 97.9 F (36.6 C) (Oral)  Resp 16  SpO2 96%  Physical Exam  Nursing note and vitals reviewed. Constitutional: He is oriented to person, place, and time. He appears well-developed and well-nourished. No distress.  HENT:  Head: Normocephalic and atraumatic.  Mouth/Throat: Oropharynx is clear and moist.  Eyes: EOM are normal. Pupils are equal, round, and reactive to light.  Neck: Normal range of motion. Neck supple.  Cardiovascular: Normal rate and regular rhythm.   Pulmonary/Chest: Effort normal and breath sounds normal. No respiratory distress. He has no wheezes. He has no rales.  Abdominal: Soft. Bowel sounds are  normal.  Musculoskeletal: Normal range of motion. He exhibits tenderness (paraspinal lumbar TTP without midline tenderness, neg straight leg raise). He exhibits no edema.  Neurological: He is alert and oriented to person, place, and time.       5/5 motor, sensation fully intact. Ambulating without difficulty  Skin: Skin is warm and dry. No rash noted. No erythema.  Psychiatric: He has a normal mood and affect. His behavior is normal.    ED Course  Procedures (including critical care time)  Labs Reviewed - No data to display No results found.   1. Lumbar strain       MDM  I personally performed the services described in this documentation, which was scribed in my presence. The recorded  information has been reviewed and considered.  No concerning symptoms or signs of serious causes for pt's low back pain. Suspect muscle strain due to heavy lifting.         Loren Racer, MD 11/13/11 1242  Loren Racer, MD 12/25/11 [phone]

## 2011-12-05 ENCOUNTER — Emergency Department (HOSPITAL_COMMUNITY)
Admission: EM | Admit: 2011-12-05 | Discharge: 2011-12-05 | Disposition: A | Discharge status: Home / Self Care | Payer: Medicare Other | Attending: Emergency Medicine | Admitting: Emergency Medicine

## 2011-12-05 DIAGNOSIS — Z91199 Patient's noncompliance with other medical treatment and regimen due to unspecified reason: Secondary | ICD-10-CM | POA: Insufficient documentation

## 2011-12-05 DIAGNOSIS — I10 Essential (primary) hypertension: Secondary | ICD-10-CM | POA: Insufficient documentation

## 2011-12-05 DIAGNOSIS — Z9119 Patient's noncompliance with other medical treatment and regimen: Secondary | ICD-10-CM | POA: Insufficient documentation

## 2011-12-05 DIAGNOSIS — R569 Unspecified convulsions: Secondary | ICD-10-CM

## 2011-12-05 DIAGNOSIS — G40909 Epilepsy, unspecified, not intractable, without status epilepticus: Secondary | ICD-10-CM | POA: Insufficient documentation

## 2011-12-05 DIAGNOSIS — Z9114 Patient's other noncompliance with medication regimen: Secondary | ICD-10-CM

## 2011-12-05 DIAGNOSIS — Z79899 Other long term (current) drug therapy: Secondary | ICD-10-CM | POA: Insufficient documentation

## 2011-12-05 LAB — CBC
HCT: 33.4 % — ABNORMAL LOW (ref 39.0–52.0)
Hemoglobin: 12.7 g/dL — ABNORMAL LOW (ref 13.0–17.0)
MCH: 37.1 pg — ABNORMAL HIGH (ref 26.0–34.0)
MCHC: 38 g/dL — ABNORMAL HIGH (ref 30.0–36.0)
RBC: 3.42 MIL/uL — ABNORMAL LOW (ref 4.22–5.81)

## 2011-12-05 LAB — COMPREHENSIVE METABOLIC PANEL
ALT: 18 U/L (ref 0–53)
Alkaline Phosphatase: 122 U/L — ABNORMAL HIGH (ref 39–117)
BUN: 5 mg/dL — ABNORMAL LOW (ref 6–23)
CO2: 22 mEq/L (ref 19–32)
Calcium: 8.5 mg/dL (ref 8.4–10.5)
GFR calc Af Amer: >90 mL/min (ref >90)
GFR calc non Af Amer: >90 mL/min (ref >90)
Glucose, Bld: 120 mg/dL — ABNORMAL HIGH (ref 70–99)
Potassium: 4 mEq/L (ref 3.5–5.1)
Sodium: 130 mEq/L — ABNORMAL LOW (ref 135–145)
Total Protein: 7.1 g/dL (ref 6.0–8.3)

## 2011-12-05 LAB — ETHANOL: Alcohol, Ethyl (B): <11 mg/dL (ref 0–11)

## 2011-12-05 MED ORDER — PHENYTOIN SODIUM EXTENDED 100 MG PO CAPS: 300.0000 mg | ORAL_CAPSULE | Freq: Every day | ORAL | Status: DC

## 2011-12-05 MED ORDER — PHENYTOIN SODIUM EXTENDED 100 MG PO CAPS
400.0000 mg | ORAL_CAPSULE | Freq: Three times a day (TID) | ORAL | Status: DC
  Administered 2011-12-05: 400 mg via ORAL
  Filled 2011-12-05: qty 4

## 2011-12-05 MED ORDER — LORAZEPAM 2 MG/ML IJ SOLN
1.0000 mg | Freq: Once | INTRAMUSCULAR | Status: AC
  Administered 2011-12-05: 1 mg via INTRAVENOUS
  Filled 2011-12-05: qty 1

## 2011-12-05 NOTE — ED Notes (Signed)
Pt alert and oriented x4. Respirations even and unlabored, bilateral symmetrical rise and fall of chest. Skin warm and dry. In no acute distress. Denies needs.  Ambulating without assistance. Verbalized understanding d/c instructions. Escorted to the d/c window. Given a bus pass.

## 2011-12-05 NOTE — ED Notes (Signed)
Bed:WA16<BR> Expected date:<BR> Expected time:<BR> Means of arrival:<BR> Comments:<BR> ems seizure

## 2011-12-05 NOTE — ED Notes (Addendum)
Per ems pt is from home. Pt has hx of seizures, pt was found in posticatal state, standing up in room drooling. When ems arrived pt laying in bed and had another seizure, last 1 min. Slighty combative postictal. At present cooperative. Pt lost bowel control.  18 g L AC  Pt hx of alcoholic, wife reported to ems that pt has been on a bender for the last couple days, also reports pt is noncompliant with medications.

## 2011-12-05 NOTE — ED Notes (Signed)
Pt being cleaned at present. Pt soiled his underwear and pants, pt aware pants and underwear were discarded due to being covered in stool.

## 2011-12-05 NOTE — ED Provider Notes (Signed)
History     CSN: [id number]  Arrival date & time 12/05/11  0810   First MD Initiated Contact with Patient 12/05/11 [phone]      Chief Complaint  Patient presents with  . Seizures     The history is provided by the patient and the EMS personnel.   as reported that the patient a tonic-clonic seizure at home today.  He has a history of alcohol abuse.  He also has a history of epilepsy for which he takes Dilantin.  He reports to me that he has been noncompliant with his Dilantin.  He denies recent abstinence from alcohol use.  He reports his last alcohol use was last evening and was a normal amount for him.  His had a long-standing history of seizures.  He is unable to tell me why he refuses to take his medications at times.  He reports sometimes he forgets.  He does not have a clear reason for not taking his medications at this time.  There is no family available for additional history.  He's had no fevers.  He reports no headache at this time.  He has no complaints.  He had urinary incontinence and postictal period  Past Medical History  Diagnosis Date  . Hypertension   . Seizures   . ETOH abuse   . Drug abuse   . PTSD (post-traumatic stress disorder)   . H/O prolonged Q-T interval on ECG   . Polysubstance abuse   . Alcohol abuse     No past surgical history on file.  No family history on file.  History  Substance Use Topics  . Smoking status: Current Some Day Smoker -- 0.5 packs/day for 40 years    Types: Cigarettes  . Smokeless tobacco: Not on file  . Alcohol Use: Yes     abuse/binge      Review of Systems  Neurological: Positive for seizures.  All other systems reviewed and are negative.    Allergies  Review of patient's allergies indicates no known allergies.  Home Medications   Current Outpatient Rx  Name Route Sig Dispense Refill  . FOLIC ACID 1 MG PO TABS Oral Take 1 tablet (1 mg total) by mouth daily. 30 tablet 0  . LISINOPRIL 20 MG PO TABS Oral Take 10 mg  by mouth daily.     Marland Kitchen PHENYTOIN SODIUM EXTENDED 100 MG PO CAPS Oral Take 1 capsule (100 mg total) by mouth 3 (three) times daily. 35 capsule 0  . PHENYTOIN SODIUM EXTENDED 100 MG PO CAPS Oral Take 3 capsules (300 mg total) by mouth daily. 90 capsule 0    Please take 300 mg at 2pm today and 300mg  at 5pm t ...  . SERTRALINE HCL 100 MG PO TABS Oral Take 200 mg by mouth daily.    . THIAMINE HCL 100 MG PO TABS Oral Take 100 mg by mouth daily.      BP 144/96  Pulse 94  Temp 98.1 F (36.7 C) (Oral)  Resp 19  SpO2 96%  Physical Exam  Nursing note and vitals reviewed. Constitutional: He is oriented to person, place, and time. He appears well-developed and well-nourished.  HENT:  Head: Normocephalic and atraumatic.  Eyes: EOM are normal. Pupils are equal, round, and reactive to light.  Neck: Normal range of motion.  Cardiovascular: Normal rate, regular rhythm, normal heart sounds and intact distal pulses.   Pulmonary/Chest: Effort normal and breath sounds normal. No respiratory distress.  Abdominal: Soft. He exhibits no  distension. There is no tenderness.  Musculoskeletal: Normal range of motion.  Neurological: He is alert and oriented to person, place, and time.       5/5 strength in major muscle groups of  bilateral upper and lower extremities. Speech normal. No facial asymetry.   Skin: Skin is warm and dry.  Psychiatric: He has a normal mood and affect. Judgment normal.    ED Course  Procedures (including critical care time)  Labs Reviewed  PHENYTOIN LEVEL, TOTAL - Abnormal; Notable for the following:    Phenytoin Lvl 2.7 (*)     All other components within normal limits  CBC - Abnormal; Notable for the following:    RBC 3.42 (*)     Hemoglobin 12.7 (*)     HCT 33.4 (*)     MCH 37.1 (*)     MCHC 38.0 (*)     RDW 16.9 (*)     Platelets 128 (*)     All other components within normal limits  COMPREHENSIVE METABOLIC PANEL - Abnormal; Notable for the following:    Sodium 130 (*)      Glucose, Bld 120 (*)     BUN 5 (*)     AST 68 (*)     Alkaline Phosphatase 122 (*)     All other components within normal limits  ETHANOL   No results found.   1. Seizure   2. Hx of medication noncompliance   3. Seizure secondary to subtherapeutic anticonvulsant medication       MDM  10:27 AM The patient continues to feel good in the emergency department.  Seizure secondary to Dilantin noncompliance.  The patient will be given an oral load of Dilantin today.  He is alert and oriented.  Discharge home in good condition.  The patient is instructed to followup with his neurologist.  I've instructed the patient return to the emergency department for new or worsening symptoms.  No fever.  No meningeal signs.        Lyanne Co, MD 12/05/11 [phone]

## 2011-12-16 ENCOUNTER — Observation Stay (HOSPITAL_COMMUNITY)
Admission: EM | Admit: 2011-12-16 | Discharge: 2011-12-18 | Disposition: A | Discharge status: Home / Self Care | Payer: Medicare Other | Attending: Internal Medicine | Admitting: Internal Medicine

## 2011-12-16 ENCOUNTER — Encounter (HOSPITAL_COMMUNITY): Payer: Self-pay

## 2011-12-16 ENCOUNTER — Emergency Department (HOSPITAL_COMMUNITY): Payer: Medicare Other

## 2011-12-16 DIAGNOSIS — J329 Chronic sinusitis, unspecified: Secondary | ICD-10-CM

## 2011-12-16 DIAGNOSIS — I10 Essential (primary) hypertension: Secondary | ICD-10-CM

## 2011-12-16 DIAGNOSIS — F1210 Cannabis abuse, uncomplicated: Secondary | ICD-10-CM | POA: Insufficient documentation

## 2011-12-16 DIAGNOSIS — R569 Unspecified convulsions: Secondary | ICD-10-CM

## 2011-12-16 DIAGNOSIS — X58XXXA Exposure to other specified factors, initial encounter: Secondary | ICD-10-CM | POA: Insufficient documentation

## 2011-12-16 DIAGNOSIS — F17200 Nicotine dependence, unspecified, uncomplicated: Secondary | ICD-10-CM | POA: Insufficient documentation

## 2011-12-16 DIAGNOSIS — G40401 Other generalized epilepsy and epileptic syndromes, not intractable, with status epilepticus: Principal | ICD-10-CM | POA: Insufficient documentation

## 2011-12-16 DIAGNOSIS — S1093XA Contusion of unspecified part of neck, initial encounter: Secondary | ICD-10-CM | POA: Insufficient documentation

## 2011-12-16 DIAGNOSIS — J449 Chronic obstructive pulmonary disease, unspecified: Secondary | ICD-10-CM | POA: Insufficient documentation

## 2011-12-16 DIAGNOSIS — F4310 Post-traumatic stress disorder, unspecified: Secondary | ICD-10-CM

## 2011-12-16 DIAGNOSIS — Z23 Encounter for immunization: Secondary | ICD-10-CM | POA: Insufficient documentation

## 2011-12-16 DIAGNOSIS — E871 Hypo-osmolality and hyponatremia: Secondary | ICD-10-CM

## 2011-12-16 DIAGNOSIS — G40901 Epilepsy, unspecified, not intractable, with status epilepticus: Secondary | ICD-10-CM

## 2011-12-16 DIAGNOSIS — Z79899 Other long term (current) drug therapy: Secondary | ICD-10-CM | POA: Insufficient documentation

## 2011-12-16 DIAGNOSIS — R4182 Altered mental status, unspecified: Secondary | ICD-10-CM

## 2011-12-16 DIAGNOSIS — G9349 Other encephalopathy: Secondary | ICD-10-CM | POA: Insufficient documentation

## 2011-12-16 DIAGNOSIS — J4489 Other specified chronic obstructive pulmonary disease: Secondary | ICD-10-CM | POA: Insufficient documentation

## 2011-12-16 DIAGNOSIS — F1010 Alcohol abuse, uncomplicated: Secondary | ICD-10-CM

## 2011-12-16 DIAGNOSIS — S0003XA Contusion of scalp, initial encounter: Secondary | ICD-10-CM | POA: Insufficient documentation

## 2011-12-16 LAB — CBC WITH DIFFERENTIAL/PLATELET
Blasts: 0 %
Lymphocytes Relative: 26 % (ref 12–46)
Lymphs Abs: 1.1 10*3/uL (ref 0.7–4.0)
MCH: 37.3 pg — ABNORMAL HIGH (ref 26.0–34.0)
MCHC: 37.6 g/dL — ABNORMAL HIGH (ref 30.0–36.0)
Myelocytes: 0 %
Neutro Abs: 2.4 10*3/uL (ref 1.7–7.7)
Neutrophils Relative %: 60 % (ref 43–77)
Platelets: 154 10*3/uL (ref 150–400)
Promyelocytes Absolute: 0 %
nRBC: 0 /100 WBC

## 2011-12-16 LAB — URINALYSIS, ROUTINE W REFLEX MICROSCOPIC
Hgb urine dipstick: NEGATIVE
Nitrite: NEGATIVE
Protein, ur: 100 mg/dL — AB
Urobilinogen, UA: 1 mg/dL (ref 0.0–1.0)

## 2011-12-16 LAB — CBC
MCH: 37.2 pg — ABNORMAL HIGH (ref 26.0–34.0)
MCV: 98.8 fL (ref 78.0–100.0)
Platelets: 144 10*3/uL — ABNORMAL LOW (ref 150–400)
RBC: 3.39 MIL/uL — ABNORMAL LOW (ref 4.22–5.81)

## 2011-12-16 LAB — PHOSPHORUS: Phosphorus: 2.8 mg/dL (ref 2.3–4.6)

## 2011-12-16 LAB — URINE MICROSCOPIC-ADD ON

## 2011-12-16 LAB — MAGNESIUM: Magnesium: 1.7 mg/dL (ref 1.5–2.5)

## 2011-12-16 LAB — ALBUMIN: Albumin: 3.7 g/dL (ref 3.5–5.2)

## 2011-12-16 LAB — BASIC METABOLIC PANEL
BUN: 3 mg/dL — ABNORMAL LOW (ref 6–23)
CO2: 23 mEq/L (ref 19–32)
Chloride: 95 mEq/L — ABNORMAL LOW (ref 96–112)
GFR calc Af Amer: >90 mL/min (ref >90)
Glucose, Bld: 187 mg/dL — ABNORMAL HIGH (ref 70–99)
Potassium: 3.5 mEq/L (ref 3.5–5.1)

## 2011-12-16 LAB — RAPID URINE DRUG SCREEN, HOSP PERFORMED
Barbiturates: NOT DETECTED
Opiates: NOT DETECTED
Tetrahydrocannabinol: POSITIVE — AB

## 2011-12-16 LAB — ETHANOL: Alcohol, Ethyl (B): <11 mg/dL (ref 0–11)

## 2011-12-16 MED ORDER — MICONAZOLE NITRATE 2 % EX POWD: 1.0000 "application " | CUTANEOUS | Status: DC | PRN

## 2011-12-16 MED ORDER — BISACODYL 5 MG PO TBEC
5.0000 mg | DELAYED_RELEASE_TABLET | Freq: Every day | ORAL | Status: DC | PRN
  Filled 2011-12-16: qty 1

## 2011-12-16 MED ORDER — FOLIC ACID 1 MG PO TABS
1.0000 mg | ORAL_TABLET | Freq: Once | ORAL | Status: AC
  Administered 2011-12-16: 1 mg via ORAL
  Filled 2011-12-16: qty 1

## 2011-12-16 MED ORDER — SERTRALINE HCL 100 MG PO TABS
200.0000 mg | ORAL_TABLET | Freq: Every day | ORAL | Status: DC
  Administered 2011-12-16 – 2011-12-18 (×3): 200 mg via ORAL
  Filled 2011-12-16 (×3): qty 2

## 2011-12-16 MED ORDER — SODIUM CHLORIDE 0.9 % IV BOLUS (SEPSIS)
1000.0000 mL | Freq: Once | INTRAVENOUS | Status: AC
  Administered 2011-12-16: 1000 mL via INTRAVENOUS

## 2011-12-16 MED ORDER — SODIUM CHLORIDE 0.9 % IV SOLN
Freq: Once | INTRAVENOUS | Status: AC
  Administered 2011-12-16: 1000 mL via INTRAVENOUS

## 2011-12-16 MED ORDER — LORAZEPAM 1 MG PO TABS: 1.0000 mg | ORAL_TABLET | Freq: Four times a day (QID) | ORAL | Status: DC | PRN

## 2011-12-16 MED ORDER — AMLODIPINE BESYLATE 5 MG PO TABS
5.0000 mg | ORAL_TABLET | Freq: Every day | ORAL | Status: DC
  Administered 2011-12-16 – 2011-12-18 (×3): 5 mg via ORAL
  Filled 2011-12-16 (×3): qty 1

## 2011-12-16 MED ORDER — FOLIC ACID 1 MG PO TABS: 1.0000 mg | ORAL_TABLET | Freq: Every day | ORAL | Status: DC

## 2011-12-16 MED ORDER — SODIUM CHLORIDE 0.9 % IV SOLN
200.0000 mg | Freq: Once | INTRAVENOUS | Status: AC
  Administered 2011-12-16: 200 mg via INTRAVENOUS
  Filled 2011-12-16: qty 4

## 2011-12-16 MED ORDER — IBUPROFEN 400 MG PO TABS
400.0000 mg | ORAL_TABLET | Freq: Four times a day (QID) | ORAL | Status: DC | PRN
  Filled 2011-12-16: qty 1

## 2011-12-16 MED ORDER — ACETAMINOPHEN 325 MG PO TABS: 650.0000 mg | ORAL_TABLET | Freq: Four times a day (QID) | ORAL | Status: DC | PRN

## 2011-12-16 MED ORDER — ENOXAPARIN SODIUM 40 MG/0.4ML ~~LOC~~ SOLN
40.0000 mg | SUBCUTANEOUS | Status: DC
  Administered 2011-12-16 – 2011-12-17 (×2): 40 mg via SUBCUTANEOUS
  Filled 2011-12-16 (×3): qty 0.4

## 2011-12-16 MED ORDER — THIAMINE HCL 100 MG/ML IJ SOLN
100.0000 mg | Freq: Every day | INTRAMUSCULAR | Status: DC
  Administered 2011-12-16: 100 mg via INTRAVENOUS
  Filled 2011-12-16: qty 2

## 2011-12-16 MED ORDER — ONDANSETRON HCL 4 MG PO TABS: 4.0000 mg | ORAL_TABLET | Freq: Four times a day (QID) | ORAL | Status: DC | PRN

## 2011-12-16 MED ORDER — LORAZEPAM 2 MG/ML IJ SOLN
INTRAMUSCULAR | Status: AC
  Filled 2011-12-16: qty 1

## 2011-12-16 MED ORDER — ONDANSETRON HCL 4 MG/2ML IJ SOLN: 4.0000 mg | Freq: Four times a day (QID) | INTRAMUSCULAR | Status: DC | PRN

## 2011-12-16 MED ORDER — SODIUM CHLORIDE 0.9 % IJ SOLN
3.0000 mL | Freq: Two times a day (BID) | INTRAMUSCULAR | Status: DC
  Administered 2011-12-16 – 2011-12-18 (×2): 3 mL via INTRAVENOUS

## 2011-12-16 MED ORDER — TRAZODONE HCL 50 MG PO TABS
50.0000 mg | ORAL_TABLET | Freq: Every evening | ORAL | Status: DC | PRN
  Filled 2011-12-16: qty 1

## 2011-12-16 MED ORDER — FOLIC ACID 1 MG PO TABS
1.0000 mg | ORAL_TABLET | Freq: Every day | ORAL | Status: DC
  Administered 2011-12-16 – 2011-12-18 (×3): 1 mg via ORAL
  Filled 2011-12-16 (×3): qty 1

## 2011-12-16 MED ORDER — PHENYTOIN SODIUM EXTENDED 100 MG PO CAPS
100.0000 mg | ORAL_CAPSULE | Freq: Three times a day (TID) | ORAL | Status: DC
  Administered 2011-12-16 – 2011-12-18 (×5): 100 mg via ORAL
  Filled 2011-12-16 (×8): qty 1

## 2011-12-16 MED ORDER — LISINOPRIL 10 MG PO TABS
10.0000 mg | ORAL_TABLET | Freq: Every day | ORAL | Status: DC
  Administered 2011-12-16 – 2011-12-18 (×3): 10 mg via ORAL
  Filled 2011-12-16 (×3): qty 1

## 2011-12-16 MED ORDER — THIAMINE HCL 100 MG PO TABS: 100.0000 mg | ORAL_TABLET | Freq: Every day | ORAL | Status: DC

## 2011-12-16 MED ORDER — LORAZEPAM 2 MG/ML IJ SOLN: 2.0000 mg | Freq: Once | INTRAMUSCULAR | Status: DC

## 2011-12-16 MED ORDER — ADULT MULTIVITAMIN W/MINERALS CH
1.0000 | ORAL_TABLET | Freq: Every day | ORAL | Status: DC
  Administered 2011-12-16 – 2011-12-18 (×3): 1 via ORAL
  Filled 2011-12-16 (×3): qty 1

## 2011-12-16 MED ORDER — VITAMIN B-1 100 MG PO TABS
100.0000 mg | ORAL_TABLET | Freq: Every day | ORAL | Status: DC
  Administered 2011-12-16 – 2011-12-18 (×3): 100 mg via ORAL
  Filled 2011-12-16 (×3): qty 1

## 2011-12-16 MED ORDER — SODIUM CHLORIDE 0.9 % IV SOLN: INTRAVENOUS | Status: DC

## 2011-12-16 MED ORDER — ACETAMINOPHEN 650 MG RE SUPP: 650.0000 mg | Freq: Four times a day (QID) | RECTAL | Status: DC | PRN

## 2011-12-16 MED ORDER — LORAZEPAM 2 MG/ML IJ SOLN: 1.0000 mg | Freq: Four times a day (QID) | INTRAMUSCULAR | Status: DC | PRN

## 2011-12-16 NOTE — ED Notes (Signed)
Pt here for seizure, per family heard him fall and seize at 0630 and called 911 at 0715, on ems arrival pt was seizing, unknown if seizine the entire time, on arrival has left sided gaze and not following commands, pt is yawning, pt also was given versed 5 mg im

## 2011-12-16 NOTE — ED Notes (Signed)
Pt stopped seizing, suctioned pt's mouth, airway clear. Pt talking with staff.

## 2011-12-16 NOTE — ED Notes (Signed)
Pt reports he is feeling fine, told to let staff knows if he feels like he is going to have another seizure.

## 2011-12-16 NOTE — ED Provider Notes (Addendum)
History     CSN: [id number]  Arrival date & time 12/16/11  4098   First MD Initiated Contact with Patient 12/16/11 [phone]      Chief Complaint  Patient presents with  . Seizures    (Consider location/radiation/quality/duration/timing/severity/associated sxs/prior treatment) HPI Comments: The history is provided by the patient and the EMS personnel. LEVEL 5 CAVEAT DUE TO ALTERED MENTAL STATUS, POST ICTAL  Pt comes in with cc of seizures. Reportedly, patient had a tonic-clonic seizure at home today, found on the floor close to bed by wife. Pt was actively seizing at EMS arrival, and he was given 5 mg IM versed with relief of his symptoms.  Medical records show he has a history of alcohol abuse, polysubstance abuse and epilepsy for which he takes Dilantin but has been non compliant.  Patient is a 66 y.o. male presenting with seizures. The history is provided by medical records and the EMS personnel.  Seizures     Past Medical History  Diagnosis Date  . Hypertension   . Seizures   . ETOH abuse   . Drug abuse   . PTSD (post-traumatic stress disorder)   . H/O prolonged Q-T interval on ECG   . Polysubstance abuse   . Alcohol abuse     No past surgical history on file.  No family history on file.  History  Substance Use Topics  . Smoking status: Current Some Day Smoker -- 0.5 packs/day for 40 years    Types: Cigarettes  . Smokeless tobacco: Not on file  . Alcohol Use: Yes     abuse/binge      Review of Systems  Unable to perform ROS: Mental status change  Neurological: Positive for seizures.    Allergies  Review of patient's allergies indicates no known allergies.  Home Medications   Current Outpatient Rx  Name Route Sig Dispense Refill  . AMLODIPINE BESYLATE 5 MG PO TABS Oral Take 5 mg by mouth daily.    Marland Kitchen FOLIC ACID 1 MG PO TABS Oral Take 1 tablet (1 mg total) by mouth daily. 30 tablet 0  . IBUPROFEN 400 MG PO TABS Oral Take 400 mg by mouth every 6 (six)  hours as needed. For leg pain    . LISINOPRIL 20 MG PO TABS Oral Take 10 mg by mouth daily.     Marland Kitchen MICONAZOLE NITRATE 2 % EX POWD Topical Apply 1 application topically as needed. For itching    . PHENYTOIN SODIUM EXTENDED 100 MG PO CAPS Oral Take 1 capsule (100 mg total) by mouth 3 (three) times daily. 35 capsule 0  . SERTRALINE HCL 100 MG PO TABS Oral Take 200 mg by mouth daily.    . THIAMINE HCL 100 MG PO TABS Oral Take 100 mg by mouth daily.    . TRAZODONE HCL 50 MG PO TABS Oral Take 50 mg by mouth at bedtime as needed. For sleep      BP 146/108  Pulse 119  Temp 97.8 F (36.6 C) (Oral)  Resp 21  SpO2 99%  Physical Exam  Nursing note and vitals reviewed. Constitutional: He appears well-developed.  HENT:       Abrasion and hematoma to the right forehead  Eyes: Pupils are equal, round, and reactive to light.  Neck: Normal range of motion. Neck supple.       In c-collar  Cardiovascular: Normal rate and regular rhythm.   Pulmonary/Chest: Effort normal and breath sounds normal.  Abdominal: Soft. Bowel sounds are normal.  He exhibits no distension. There is no tenderness.  Skin: Skin is warm.    ED Course  Procedures (including critical care time)   Labs Reviewed  CBC WITH DIFFERENTIAL  BASIC METABOLIC PANEL  PHENYTOIN LEVEL, FREE  MAGNESIUM  PHOSPHORUS  URINALYSIS, ROUTINE W REFLEX MICROSCOPIC  URINE RAPID DRUG SCREEN (HOSP PERFORMED)   Dg Chest Port 1 View  12/16/2011  *RADIOLOGY REPORT*  Clinical Data: Short of breath.  Seizure  PORTABLE CHEST - 1 VIEW  Comparison: 08/11/2011  Findings: Mild left lower lobe airspace disease which may represent atelectasis or pneumonia.  Right lung is clear.  Negative for heart failure or effusion.  IMPRESSION: Mild left lower lobe atelectasis/infiltrate.   Original Report Authenticated By: Camelia Phenes, [name]      No diagnosis found.    MDM   Date: 12/16/2011  Rate: 126  Rhythm: sinus tachycardia  QRS Axis: normal  Intervals:  normal  ST/T Wave abnormalities: normal  Conduction Disutrbances: none  Narrative Interpretation: unremarkable  DDx: -Seizure disorder -Meningitis -Trauma -ICH -Electrolyte abnormality -Metabolic derangement -Stroke -Toxin induced seizures -Medication side effects -Hypoxia -Hypoglycemia   Pt comes in with cc of seizures. Hx of seizures, non compliance and etoh abuse. CBG is WNL. He is post ictal, incontinent. CT head ordered along with basic labs. Will r/o infectious process. Will hydrate.    Derwood Kaplan, MD 12/16/11 0920  Pt will be admitted. Neurology notifed about him as well. Pt is tachycardic, still post ictal, and had 2 seizure episode witnessed by [place]. Dilantin level is low, but not too low. Iv 200 mg ordered after consulting pharmacist. CXR shows opacity, possible pneumonia. He has no leukocytosis, no fevers, no tachypnea, no hypoxia. Favor more of as aspiration picture. No AB given. Tachycardia improved post 2 liters bolus.   Derwood Kaplan, MD 12/16/11 1408  4:44 PM Pt more awake, still confused. Vitals are still WNL. O2 sats 100% still on room air. Again, no clinical impression of pneumonia, and we are going to ask admitting team to monitor the pulmonary status, and reassess for pneumonia.  Derwood Kaplan, MD 12/16/11 [phone]

## 2011-12-16 NOTE — ED Notes (Signed)
Pt here by ems for seizure activity, see triage note

## 2011-12-16 NOTE — Progress Notes (Signed)
MEDICATION RELATED CONSULT NOTE - INITIAL   Pharmacy Consult for Phenytoin "Load" Indication: Seizures  No Known Allergies  Patient Measurements:   Wt: 56.2 kg Ht: 175 cm  Vital Signs: Temp: 97.8 F (36.6 C) (10/16 0829) Temp src: Oral (10/16 0829) BP: 144/112 mmHg (10/16 1049) Pulse Rate: 114  (10/16 1049) Intake/Output from previous day:   Intake/Output from this shift:    Labs:  [facility] 12/16/11 0834  WBC 4.1  HGB 13.2  HCT 35.1*  PLT 154  APTT --  CREATININE 0.64  LABCREA --  CREATININE 0.64  CREAT24HRUR --  MG 1.7  PHOS 2.8  ALBUMIN --  PROT --  ALBUMIN --  AST --  ALT --  ALKPHOS --  BILITOT --  BILIDIR --  IBILI --   The CrCl is unknown because both a height and weight (above a minimum accepted value) are required for this calculation.   Microbiology: No results found for this or any previous visit (from the past 720 hour(s)).  Medical History: Past Medical History  Diagnosis Date  . Hypertension   . Seizures   . ETOH abuse   . Drug abuse   . PTSD (post-traumatic stress disorder)   . H/O prolonged Q-T interval on ECG   . Polysubstance abuse   . Alcohol abuse     Assessment: 66 y.o. M who presented to the [facility] after having a witnessed tonic-clonic seizure earlier today -- with continued active seizures upon EMS arrival. [facility] level upon [facility] to the [facility] was 8.1 -- however this is uncorrected. If corrected by an old albumin, this corrects to around 9.6.   Pharmacy was consulted to "load" patient with DPH. Since the patient's level is estimated to be close to goal -- the patient will not require a full load. Per discussion with the EDP it was decided to give a dose of 200 mg IV x 1 to help get the patient to goal range. PTA the patient was on 100 mg tid.   Goal of Therapy:  Phenytoin level of 10-20 mcg/ml (corrected for low albumin and renal function)  Plan:  1. Phenytoin 200 mg IV x 1 dose 2. Will f/u restart of home phenytoin doses  starting this evening and any additional dose requirements needed.  Georgina Pillion, PharmD, BCPS Clinical Pharmacist Pager: [phone] 12/16/2011 1:56 PM

## 2011-12-16 NOTE — Progress Notes (Signed)
Reason for Consult: seizure Referring Physician: Dr. Donna Bernard  CC: patient confused unable to communicate  HPI: Miguel Hanson is an 66 y.o. male with a reported history of seizures. He apparently had a seizure early this am and his wife called 9-1-1. He has had witnessed seizure activity. His dilantin level is low. He has been post-ictal; however, it is unclear if he has any underlying dementia because the wife has not be able to be contacted. In addition, he drinks alcohol and there is a concern about withdrawal. Also, there are concerns regarding medical compliance.  Past Medical History  Diagnosis Date  . Hypertension   . Seizures   . ETOH abuse   . Drug abuse   . PTSD (post-traumatic stress disorder)   . H/O prolonged Q-T interval on ECG   . Polysubstance abuse   . Alcohol abuse     No past surgical history on file.  No family history on file.  Social History:  reports that he has been smoking Cigarettes.  He has a 20 pack-year smoking history. He does not have any smokeless tobacco history on file. He reports that he drinks alcohol. He reports that he uses illicit drugs (Marijuana).  No Known Allergies  Medications:  Prior to Admission:  Prescriptions prior to admission  Medication Sig Dispense Refill  . amLODipine (NORVASC) 5 MG tablet Take 5 mg by mouth daily.      . folic acid (FOLVITE) 1 MG tablet Take 1 tablet (1 mg total) by mouth daily.  30 tablet  0  . ibuprofen (ADVIL,MOTRIN) 400 MG tablet Take 400 mg by mouth every 6 (six) hours as needed. For leg pain      . lisinopril (PRINIVIL,ZESTRIL) 20 MG tablet Take 10 mg by mouth daily.       . miconazole (MICOTIN) 2 % powder Apply 1 application topically as needed. For itching      . phenytoin (DILANTIN) 100 MG ER capsule Take 1 capsule (100 mg total) by mouth 3 (three) times daily.  35 capsule  0  . sertraline (ZOLOFT) 100 MG tablet Take 200 mg by mouth daily.      Marland Kitchen thiamine 100 MG tablet Take 100 mg by mouth daily.        . traZODone (DESYREL) 50 MG tablet Take 50 mg by mouth at bedtime as needed. For sleep       Scheduled:   . sodium chloride   Intravenous Once  . amLODipine  5 mg Oral Daily  . enoxaparin (LOVENOX) injection  40 mg Subcutaneous Q24H  . folic acid  1 mg Oral Once  . folic acid  1 mg Oral Daily  . lisinopril  10 mg Oral Daily  . multivitamin with minerals  1 tablet Oral Daily  . phenytoin (DILANTIN) IV  200 mg Intravenous Once  . phenytoin  100 mg Oral TID  . sertraline  200 mg Oral Daily  . sodium chloride  1,000 mL Intravenous Once  . sodium chloride  1,000 mL Intravenous Once  . sodium chloride  3 mL Intravenous Q12H  . thiamine  100 mg Oral Daily  . DISCONTD: folic acid  1 mg Oral Daily  . DISCONTD: LORazepam  2 mg Intravenous Once  . DISCONTD: thiamine  100 mg Intravenous Daily  . DISCONTD: thiamine  100 mg Oral Daily    ROS: History obtained from chart review  General ROS: negative for - chills, fatigue, fever, night sweats, weight gain or weight loss Psychological ROS:  negative for - behavioral disorder, hallucinations, memory difficulties, mood swings or suicidal ideation Ophthalmic ROS: negative for - blurry vision, double vision, eye pain or loss of vision ENT ROS: negative for - epistaxis, nasal discharge, oral lesions, sore throat, tinnitus or vertigo Allergy and Immunology ROS: negative for - hives or itchy/watery eyes Hematological and Lymphatic ROS: negative for - bleeding problems, bruising or swollen lymph nodes Endocrine ROS: negative for - galactorrhea, hair pattern changes, polydipsia/polyuria or temperature intolerance Respiratory ROS: negative for - cough, hemoptysis, shortness of breath or wheezing Cardiovascular ROS: negative for - chest pain, dyspnea on exertion, edema or irregular heartbeat Gastrointestinal ROS: negative for - abdominal pain, diarrhea, hematemesis, nausea/vomiting or stool incontinence Genito-Urinary ROS: negative for - dysuria,  hematuria, incontinence or urinary frequency/urgency Musculoskeletal ROS: negative for - joint swelling or muscular weakness Neurological ROS: as noted in HPI Dermatological ROS: negative for rash and skin lesion changes   Physical Examination: Blood pressure 146/95, pulse 78, temperature 98.2 F (36.8 C), temperature source Oral, resp. rate 18, SpO2 95.00%.  Neurologic Examination Mental Status: He is awake, not oriented. His thought content is not appropriate. He does try to answer questions however dysarthric. Really does not follow commands. Mostly he looks at his wrist band and repeats the number 1 to anything that you ask him to say or do. Cranial Nerves: II: visual fields grossly normal (unable to test), pupils equal, round III,IV, VI: ptosis not present, extra-ocular motions intact bilaterally (unable to test) V,VII: patient not able to cooperate with testing VIII: hearing normal bilaterally, exhibits no problem with hearing IX,X: gag reflex present XI: does not actively participate with this test XII: will not open mouth for tongue inspection Motor: patient will not cooperate w/extremity motor exam, he has spontaneous movements of all extremities Sensory: unable to test  Deep Tendon Reflexes: patient unable to cooperate Plantars: Right: downgoing   Left: downgoing Cerebellar: patient is unable to follow commands, gait not tested due to mental status   Results for orders placed during the hospital encounter of 12/16/11 (from the past 48 hour(s))  URINALYSIS, ROUTINE W REFLEX MICROSCOPIC     Status: Abnormal   Collection Time   12/16/11  8:31 AM      Component Value Range Comment   Color, Urine YELLOW  YELLOW    APPearance CLEAR  CLEAR    Specific Gravity, Urine 1.017  1.005 - 1.030    pH 7.0  5.0 - 8.0    Glucose, UA 500 (*) NEGATIVE mg/dL    Hgb urine dipstick NEGATIVE  NEGATIVE    Bilirubin Urine NEGATIVE  NEGATIVE    Ketones, ur NEGATIVE  NEGATIVE mg/dL    Protein,  ur 161 (*) NEGATIVE mg/dL    Urobilinogen, UA 1.0  0.0 - 1.0 mg/dL    Nitrite NEGATIVE  NEGATIVE    Leukocytes, UA NEGATIVE  NEGATIVE   URINE RAPID DRUG SCREEN (HOSP PERFORMED)     Status: Abnormal   Collection Time   12/16/11  8:31 AM      Component Value Range Comment   Opiates NONE DETECTED  NONE DETECTED    Cocaine NONE DETECTED  NONE DETECTED    Benzodiazepines POSITIVE (*) NONE DETECTED    Amphetamines NONE DETECTED  NONE DETECTED    Tetrahydrocannabinol POSITIVE (*) NONE DETECTED    Barbiturates NONE DETECTED  NONE DETECTED   URINE MICROSCOPIC-ADD ON     Status: Normal   Collection Time   12/16/11  8:31 AM  Component Value Range Comment   Squamous Epithelial / LPF RARE  RARE    WBC, UA 0-2  <3 WBC/hpf    RBC / HPF 0-2  <3 RBC/hpf    Bacteria, UA RARE  RARE   CBC WITH DIFFERENTIAL     Status: Abnormal   Collection Time   12/16/11  8:34 AM      Component Value Range Comment   WBC 4.1  4.0 - 10.5 K/uL WHITE COUNT CONFIRMED ON SMEAR   RBC 3.54 (*) 4.22 - 5.81 MIL/uL    Hemoglobin 13.2  13.0 - 17.0 g/dL    HCT 16.1 (*) 09.6 - 52.0 %    MCV 99.2  78.0 - 100.0 fL    MCH 37.3 (*) 26.0 - 34.0 pg    MCHC 37.6 (*) 30.0 - 36.0 g/dL RULED OUT INTERFERING SUBSTANCES   RDW 16.5 (*) 11.5 - 15.5 %    Platelets 154  150 - 400 K/uL    Neutrophils Relative 60  43 - 77 %    Lymphocytes Relative 26  12 - 46 %    Monocytes Relative 9  3 - 12 %    Eosinophils Relative 4  0 - 5 %    Basophils Relative 1  0 - 1 %    Band Neutrophils 0  0 - 10 %    Metamyelocytes Relative 0      Myelocytes 0      Promyelocytes Absolute 0      Blasts 0      nRBC 0  0 /100 WBC    Neutro Abs 2.4  1.7 - 7.7 K/uL    Lymphs Abs 1.1  0.7 - 4.0 K/uL    Monocytes Absolute 0.4  0.1 - 1.0 K/uL    Eosinophils Absolute 0.2  0.0 - 0.7 K/uL    Basophils Absolute 0.0  0.0 - 0.1 K/uL    RBC Morphology TARGET CELLS   PAPPENHEIMER BODIES  BASIC METABOLIC PANEL     Status: Abnormal   Collection Time   12/16/11   8:34 AM      Component Value Range Comment   Sodium 133 (*) 135 - 145 mEq/L    Potassium 3.5  3.5 - 5.1 mEq/L    Chloride 95 (*) 96 - 112 mEq/L    CO2 23  19 - 32 mEq/L    Glucose, Bld 187 (*) 70 - 99 mg/dL    BUN 3 (*) 6 - 23 mg/dL    Creatinine, Ser 0.45  0.50 - 1.35 mg/dL    Calcium 8.8  8.4 - 40.9 mg/dL    GFR calc non Af Amer >90  >90 mL/min    GFR calc Af Amer >90  >90 mL/min   MAGNESIUM     Status: Normal   Collection Time   12/16/11  8:34 AM      Component Value Range Comment   Magnesium 1.7  1.5 - 2.5 mg/dL   PHOSPHORUS     Status: Normal   Collection Time   12/16/11  8:34 AM      Component Value Range Comment   Phosphorus 2.8  2.3 - 4.6 mg/dL   PHENYTOIN LEVEL, TOTAL     Status: Abnormal   Collection Time   12/16/11  8:43 AM      Component Value Range Comment   Phenytoin Lvl 8.1 (*) 10.0 - 20.0 ug/mL   ALBUMIN     Status: Normal   Collection Time  12/16/11  1:58 PM      Component Value Range Comment   Albumin 3.7  3.5 - 5.2 g/dL    Ct Head Wo Contrast 12/16/2011  Chronic microvascular ischemia.  No acute intracranial abnormality. Image quality degraded by motion.  Chronic sinusitis.   Original Report Authenticated By: Camelia Phenes, [name]    Dg Chest Port 1 View 12/16/2011   Mild left lower lobe atelectasis/infiltrate.   Original Report Authenticated By: Camelia Phenes, [name]     Assessment/Plan  66yo male that has been admitted with seizure. He remains confused. This may be related to several things including post-ictal state, alcohol withdrawal, and unknown history of dementia. Would like to talk with family members regarding his pre-hospital status but have not been able to reach.  1) seizure, reloaded with dilantin in ER 2) encephalopathy, multiple reasons including alcoholic. Check ETOH level. 3) Hypertension 4) Polysubstance Abuse, cessation counselling requested. Alcohol withdrawal precautions. 5) Prolonged QT per history 6) check tsh, etoh  level  Job Founds, MBA, [facility] Pager [phone]    Patient was seen and examined by me personally. I agree with the assessment and recommendations.  Venetia Maxon [name] [facility] [phone]

## 2011-12-16 NOTE — ED Notes (Signed)
Pt began seizing. RN and PA at bedside. Dr. Rhunette Croft made aware, came into room.

## 2011-12-16 NOTE — Progress Notes (Signed)
Pt caught taking home medciation of trazadone. Unclear how many pills patient took. Home meds taken from patient and given to family to take home. Attending MD notified. Will continue to monitor. Ramond Craver, RN

## 2011-12-16 NOTE — ED Notes (Signed)
PT's room assignment down graded to med-surg tele bed.

## 2011-12-16 NOTE — ED Notes (Signed)
PT TO RADIOLOGY

## 2011-12-16 NOTE — ED Notes (Signed)
Dr. Rhunette Croft took pt off nasal canula.

## 2011-12-16 NOTE — H&P (Signed)
[facility] History and Physical  Vardaan Leidig ZOX:[id number] DOB: June 01, 1945 DOA: 12/16/2011  Referring physician: Dr. Shyrl Numbers PCP: Provider Not In System  Specialists: Neurology-Dr. Roseanne Reno  Chief Complaint: seizures  HPI: Miguel Hanson is a 66 y.o. male with history of seizures, alcohol abuse, polysubstance abuse who presents with seizures. The history is obtained from ED staff and old records. It is reported out that the patient had a tonic clonic seizure at home today and was found by his wife on the floor close to the bed. EMS was called and they report that when they arrived he was seizing, and he was given IM Versed and his to seizing. He was transported to the ED and per EDP patient had another seizure while in the ED, he was given a dose of Dilantin 200 mg IV and neurology was consulted. Per EDP patient was post ictal following that. . At the time of my exam patient was alert, but confused and unable to answer any questions. Attempts were made to reach patient's wife but he was unavailable. A CT scan of his brain was done and showed chronic microvascular ischemia, no acute intracranial abnormality. Dilantin level was low at 8.1, urine drug screen was positive for marijuana and benzodiazepines. A chest x-ray was done and showed? Mild left lower lobe atelectasis/infiltrate-he was afebrile with no leukocytosis in the ED. She is admitted for further evaluation and management.   Review of Systems: No reports of fever, weight loss,, vision loss, decreased hearing, hoarseness, chest pain, eripheral edema, balance deficits, hemoptysis, abdominal pain, melena, hematochezia, severe indigestion/heartburn, hematuria, transient blindness, difficulty walking, depression, unusual weight change.  Past Medical History  Diagnosis Date  . Hypertension   . Seizures   . ETOH abuse   . Drug abuse   . PTSD (post-traumatic stress disorder)   . H/O prolonged Q-T interval on ECG   . Polysubstance  abuse   . Alcohol abuse    No past surgical history on file. Social History:  reports that he has been smoking Cigarettes.  He has a 20 pack-year smoking history. He does not have any smokeless tobacco history on file. He reports that he drinks alcohol. He reports that he uses illicit drugs (Marijuana).  where does patient live--home with wife   No Known Allergies  No family history on file.-unable to obtain secondary to patient's altered mental  Prior to Admission medications   Medication Sig Start Date End Date Taking? Authorizing Provider  amLODipine (NORVASC) 5 MG tablet Take 5 mg by mouth daily.   Yes Historical Provider, MD  folic acid (FOLVITE) 1 MG tablet Take 1 tablet (1 mg total) by mouth daily. 07/07/11 07/06/12 Yes Alma Concepcion Elk, MD  ibuprofen (ADVIL,MOTRIN) 400 MG tablet Take 400 mg by mouth every 6 (six) hours as needed. For leg pain   Yes Historical Provider, MD  lisinopril (PRINIVIL,ZESTRIL) 20 MG tablet Take 10 mg by mouth daily.  08/11/11 08/10/12 Yes Peter S Dammen, PA  miconazole (MICOTIN) 2 % powder Apply 1 application topically as needed. For itching   Yes Historical Provider, MD  phenytoin (DILANTIN) 100 MG ER capsule Take 1 capsule (100 mg total) by mouth 3 (three) times daily. 07/07/11 07/06/12 Yes Alison Murray, MD  sertraline (ZOLOFT) 100 MG tablet Take 200 mg by mouth daily. 07/07/11  Yes Alison Murray, MD  thiamine 100 MG tablet Take 100 mg by mouth daily. 08/13/11 08/12/12 Yes Kathlen Mody, MD  traZODone (DESYREL) 50 MG tablet Take 50 mg  by mouth at bedtime as needed. For sleep   Yes Historical Provider, MD   Physical Exam: Filed Vitals:   12/16/11 1630 12/16/11 1634 12/16/11 1646 12/16/11 1648  BP: 147/99     Pulse: 100 101 104 94  Temp:      TempSrc:      Resp: 16 15 13 14   SpO2: 100% 100% 93% 96%   Constitutional: Vital signs reviewed.  Patient is a well-developed and well-nourished  in no acute distress and cooperative with exam. Alert disoriented.  Head:  Normocephalic, bruising on right eyelids. Mouth: no erythema or exudates, MMM Eyes: PERRL, EOMI, conjunctivae normal, No scleral icterus.  Neck: Supple, Trachea midline normal ROM, No JVD, mass, thyromegaly, or carotid bruit present.  Cardiovascular: Mildly tachycardic, regular rhythm, S1 normal, S2 normal, no MRG, pulses symmetric and intact bilaterally Pulmonary/Chest: Decreased breath sounds at the bases, otherwise CTAB Abdominal: Soft. Non-tender, non-distended, bowel sounds are normal, no masses, organomegaly, or guarding present.  GU: no CVA tenderness  extremities: No cyanosis and no edema   Neurological: Alert, disoriented, extremities grossly, not reliably following simple commands. Skin: Warm, dry and intact. No rash.    Labs on Admission:  Basic Metabolic Panel:  Lab 12/16/11 1610  NA 133*  K 3.5  CL 95*  CO2 23  GLUCOSE 187*  BUN 3*  CREATININE 0.64  CALCIUM 8.8  MG 1.7  PHOS 2.8   Liver Function Tests:  Lab 12/16/11 1358  AST --  ALT --  ALKPHOS --  BILITOT --  PROT --  ALBUMIN 3.7   No results found for this basename: LIPASE:5,AMYLASE:5 in the last 168 hours No results found for this basename: AMMONIA:5 in the last 168 hours CBC:  Lab 12/16/11 0834  WBC 4.1  NEUTROABS 2.4  HGB 13.2  HCT 35.1*  MCV 99.2  PLT 154   Cardiac Enzymes: No results found for this basename: CKTOTAL:5,CKMB:5,CKMBINDEX:5,TROPONINI:5 in the last 168 hours  BNP (last 3 results) No results found for this basename: PROBNP:3 in the last 8760 hours CBG: No results found for this basename: GLUCAP:5 in the last 168 hours  Radiological Exams on Admission: Ct Head Wo Contrast  12/16/2011  *RADIOLOGY REPORT*  Clinical Data: Seizure.  Head injury.  CT HEAD WITHOUT CONTRAST  Technique:  Contiguous axial images were obtained from the base of the skull through the vertex without contrast.  Comparison: CT 08/11/2011  Findings: Image quality degraded by motion with multiple repeats.  Generalized atrophy and mild chronic microvascular ischemia. Negative for acute infarct.  Negative for hemorrhage or mass.  Right frontal soft tissue swelling.  Negative for skull fracture. Chronic sinusitis is present with thickening of the bone around the left sphenoid sinus.  There is mucosal thickening in the left frontal sinus.  IMPRESSION: Chronic microvascular ischemia.  No acute intracranial abnormality. Image quality degraded by motion.  Chronic sinusitis.   Original Report Authenticated By: Camelia Phenes, [name]    Dg Chest Port 1 View  12/16/2011  *RADIOLOGY REPORT*  Clinical Data: Short of breath.  Seizure  PORTABLE CHEST - 1 VIEW  Comparison: 08/11/2011  Findings: Mild left lower lobe airspace disease which may represent atelectasis or pneumonia.  Right lung is clear.  Negative for heart failure or effusion.  IMPRESSION: Mild left lower lobe atelectasis/infiltrate.   Original Report Authenticated By: Camelia Phenes, [name]       Assessment/Plan   Seizure, convulsive-could be secondary to subtherapeutic Dilantin level versus alcohol -His alcohol level is  less than 11, (but it's unclear if patient had been drinking at all and if so all when he last drank) -Will continue Dilantin, follow recheck level in a.m. -Await neurology evaluation for further recommendations. -When necessary Ativan and follow. Active Problems:  Alcohol abuse -As above, will empirically place on Ativan detox protocol -when necessary and follow  h/o COPD (chronic obstructive pulmonary disease) -Stable, follow  Altered mental status -likely secondary to #1, his baseline is unknown we'll follow. -Also obtain UA and follow.  HTN (hypertension), malignant -Improving blood pressure control in the ED, will resume outpatient medications monitor and further treat accordingly. ? Atelectasis versus infiltrate--per chest x-ray -Patient is afebrile with no leukocytosis and no cough -Will hold off antibiotics for now,  follow repeat chest x-ray in a.m. and further manage accordingly. Hyponatremia, mild -Hydrate follow and recheck  Code Status: Presumed fULL CODE Family Communication: Called spouse at 20 79 but no response Disposition Plan: less than 2days  Time spent: >30MINS  Kela Millin [facility] Pager [phone]  If 7PM-7AM, please contact night-coverage [email or web address] Password [facility] 12/16/2011, 5:34 PM

## 2011-12-16 NOTE — ED Notes (Signed)
Admitting physician at bedside

## 2011-12-16 NOTE — ED Notes (Signed)
PT IS NOW MORE ALLERT.

## 2011-12-17 ENCOUNTER — Observation Stay (HOSPITAL_COMMUNITY): Payer: Medicare Other

## 2011-12-17 LAB — BASIC METABOLIC PANEL
CO2: 24 mEq/L (ref 19–32)
Calcium: 8.4 mg/dL (ref 8.4–10.5)
Chloride: 94 mEq/L — ABNORMAL LOW (ref 96–112)
Glucose, Bld: 94 mg/dL (ref 70–99)
Sodium: 131 mEq/L — ABNORMAL LOW (ref 135–145)

## 2011-12-17 LAB — CBC
HCT: 33.5 % — ABNORMAL LOW (ref 39.0–52.0)
Hemoglobin: 12.6 g/dL — ABNORMAL LOW (ref 13.0–17.0)
MCH: 37.2 pg — ABNORMAL HIGH (ref 26.0–34.0)
MCV: 98.8 fL (ref 78.0–100.0)
RBC: 3.39 MIL/uL — ABNORMAL LOW (ref 4.22–5.81)

## 2011-12-17 LAB — URINE CULTURE
Colony Count: NO GROWTH
Culture: NO GROWTH

## 2011-12-17 NOTE — Progress Notes (Signed)
[facility] PROGRESS NOTE   Assessment/Plan:  Seizure, convulsive (08/11/2011) -Dilantin level sub therapeutic. Likely noncompliance. -Loaded with Dilantin. -Alcohol level less than 11 unknown last drink.  Alcohol abuse (03/08/2011) -continue monitoring with [facility]  COPD (chronic obstructive pulmonary disease) (03/10/2011) -stable  Altered mental status (08/11/2011) -resolved probably secondary to seizures and alcohol.  HTN (hypertension), malignant (12/16/2011) -controlled was on admission probably secondary to withdrawals.  Hyponatremia (12/16/2011) -continue IV fluids, check a basic metabolic panel in the morning    Code Status: Presumed fULL CODE  Family Communication: Called spouse at 16 8 but no response  Disposition Plan: less than 2days    Consultants:  Neurology  Procedures:  None  Antibiotics:  None (indicate start date, and stop date if known)  HPI/Subjective: No incidental condition patient doesn't cages was to continue to drink. He says he sometimes forget to take his medication because he is drinking. Objective: Filed Vitals:   12/16/11 2141 12/17/11 0000 12/17/11 0400 12/17/11 0800  BP: 129/93 124/86 128/86 143/97  Pulse:  102 90 97  Temp:  99.3 F (37.4 C) 99.2 F (37.3 C) 99.2 F (37.3 C)  TempSrc:  Oral Oral   Resp:  16 16 17   SpO2:  98% 96% 100%    Intake/Output Summary (Last 24 hours) at 12/17/11 0840 Last data filed at 12/17/11 0800  Gross per 24 hour  Intake      0 ml  Output    300 ml  Net   -300 ml   There were no vitals filed for this visit.  Exam:  General: Alert, awake, oriented x3, in no acute distress.  HEENT: No bruits, no goiter.  Heart: Regular rate and rhythm, without murmurs, rubs, gallops.  Lungs: Good air movement,  good air movement clear to auscultation. Abdomen: Soft, nontender, nondistended, positive bowel sounds.  Neuro: Grossly intact, nonfocal.   Data Reviewed: Basic Metabolic Panel:  Lab  12/17/11 0559 12/16/11 1917 12/16/11 0834  NA 131* -- 133*  K 3.1* -- 3.5  CL 94* -- 95*  CO2 24 -- 23  GLUCOSE 94 -- 187*  BUN 3* -- 3*  CREATININE 0.49* 0.49* 0.64  CALCIUM 8.4 -- 8.8  MG -- -- 1.7  PHOS -- -- 2.8   Liver Function Tests:  Lab 12/16/11 1358  AST --  ALT --  ALKPHOS --  BILITOT --  PROT --  ALBUMIN 3.7   No results found for this basename: LIPASE:5,AMYLASE:5 in the last 168 hours No results found for this basename: AMMONIA:5 in the last 168 hours CBC:  Lab 12/17/11 0559 12/16/11 1917 12/16/11 0834  WBC 11.4* 9.2 4.1  NEUTROABS -- -- 2.4  HGB 12.6* 12.6* 13.2  HCT 33.5* 33.5* 35.1*  MCV 98.8 98.8 99.2  PLT 149* 144* 154   Cardiac Enzymes: No results found for this basename: CKTOTAL:5,CKMB:5,CKMBINDEX:5,TROPONINI:5 in the last 168 hours BNP (last 3 results) No results found for this basename: PROBNP:3 in the last 8760 hours CBG: No results found for this basename: GLUCAP:5 in the last 168 hours  No results found for this or any previous visit (from the past 240 hour(s)).   Studies: Ct Head Wo Contrast  12/16/2011  *RADIOLOGY REPORT*  Clinical Data: Seizure.  Head injury.  CT HEAD WITHOUT CONTRAST  Technique:  Contiguous axial images were obtained from the base of the skull through the vertex without contrast.  Comparison: CT 08/11/2011  Findings: Image quality degraded by motion with multiple repeats. Generalized atrophy and mild chronic  microvascular ischemia. Negative for acute infarct.  Negative for hemorrhage or mass.  Right frontal soft tissue swelling.  Negative for skull fracture. Chronic sinusitis is present with thickening of the bone around the left sphenoid sinus.  There is mucosal thickening in the left frontal sinus.  IMPRESSION: Chronic microvascular ischemia.  No acute intracranial abnormality. Image quality degraded by motion.  Chronic sinusitis.   Original Report Authenticated By: Camelia Phenes, [name]    Portable Chest 1  View  12/17/2011  *RADIOLOGY REPORT*  Clinical Data: Status post seizure.  Altered mental status.  PORTABLE CHEST - 1 VIEW  Comparison: Plain film of the chest 08/11/2011 and 12/16/2011.  Findings: Lungs are clear.  Heart size is normal.  No pneumothorax or pleural fluid.  IMPRESSION: No acute disease.   Original Report Authenticated By: Bernadene Bell. Maricela Curet, [name]    Dg Chest Port 1 View  12/16/2011  *RADIOLOGY REPORT*  Clinical Data: Short of breath.  Seizure  PORTABLE CHEST - 1 VIEW  Comparison: 08/11/2011  Findings: Mild left lower lobe airspace disease which may represent atelectasis or pneumonia.  Right lung is clear.  Negative for heart failure or effusion.  IMPRESSION: Mild left lower lobe atelectasis/infiltrate.   Original Report Authenticated By: Camelia Phenes, [name]     Scheduled Meds:    . sodium chloride   Intravenous Once  . amLODipine  5 mg Oral Daily  . enoxaparin (LOVENOX) injection  40 mg Subcutaneous Q24H  . folic acid  1 mg Oral Once  . folic acid  1 mg Oral Daily  . lisinopril  10 mg Oral Daily  . multivitamin with minerals  1 tablet Oral Daily  . phenytoin (DILANTIN) IV  200 mg Intravenous Once  . phenytoin  100 mg Oral TID  . sertraline  200 mg Oral Daily  . sodium chloride  1,000 mL Intravenous Once  . sodium chloride  1,000 mL Intravenous Once  . sodium chloride  3 mL Intravenous Q12H  . thiamine  100 mg Oral Daily  . DISCONTD: folic acid  1 mg Oral Daily  . DISCONTD: LORazepam  2 mg Intravenous Once  . DISCONTD: thiamine  100 mg Intravenous Daily  . DISCONTD: thiamine  100 mg Oral Daily   Continuous Infusions:    . sodium chloride 50 mL/hr at 12/16/11 1830     FELIZ Rosine Beat  [facility] Pager [phone]. If 8PM-8AM, please contact night-coverage at [email or web address], password [facility] 12/17/2011, 8:40 AM  LOS: 1 day

## 2011-12-17 NOTE — Care Management Note (Signed)
    Page 1 of 1   12/17/2011     2:10:42 PM   CARE MANAGEMENT NOTE 12/17/2011  Patient:  Miguel Hanson, Miguel Hanson   Account Number:  [id number]  Date Initiated:  12/17/2011  Documentation initiated by:  GRAVES-BIGELOW,Tanmay Halteman  Subjective/Objective Assessment:   Pt admitted with seizures. Dilantin level low. Per MD notes probably noncompliant with medications.     Action/Plan:   CM did call [facility] liaison for [facility] and she stated [facility] has a program that is telephonic that assists with disease management. CM talked to  pt and he is refusing HH services at this time.   Anticipated DC Date:  12/18/2011   Anticipated DC Plan:  HOME/SELF CARE      DC Planning Services  CM consult  Patient refused services      Choice offered to / List presented to:             Status of service:  Completed, signed off Medicare Important Message given?   (If response is "NO", the following Medicare IM given date fields will be blank) Date Medicare IM given:   Date Additional Medicare IM given:    Discharge Disposition:  HOME/SELF CARE  Per UR Regulation:  Reviewed for med. necessity/level of care/duration of stay  If discussed at Long Length of Stay Meetings, dates discussed:    Comments:

## 2011-12-17 NOTE — Progress Notes (Signed)
[facility] PROGRESS [facility]    SUBJECTIVE   Patient is awake, oriented to place, states he does not take his medications daily and continues to drink .5 to 1 pint daily.   OBJECTIVE   Vital signs in last 24 hours: Temp:  [97.8 F (36.6 C)-99.3 F (37.4 C)] 99.2 F (37.3 C) (10/17 0800) Pulse Rate:  [78-168] 97  (10/17 0800) Resp:  [13-30] 17  (10/17 0800) BP: (124-181)/(86-137) 143/97 mmHg (10/17 0800) SpO2:  [82 %-100 %] 100 % (10/17 0800)  Intake/Output from previous day: 10/16 0701 - 10/17 0700 In: -  Out: 100 [Urine:100] Intake/Output this shift: Total I/O In: -  Out: 200 [Urine:200] Nutritional status: Cardiac  Past Medical History  Diagnosis Date  . Hypertension   . Seizures   . ETOH abuse   . Drug abuse   . PTSD (post-traumatic stress disorder)   . H/O prolonged Q-T interval on ECG   . Polysubstance abuse   . Alcohol abuse      Neurologic Exam:  Mental Status: Alert, oriented, thought content appropriate.  Speech fluent without evidence of aphasia.  Able to follow 3 step commands without difficulty. Cranial Nerves: II: Visual fields grossly normal, pupils equal, round, reactive to light and accommodation III,IV, VI: ptosis not present, extra-ocular motions intact bilaterally V,VII: smile symmetric, facial light touch sensation normal bilaterally VIII: hearing normal bilaterally IX,X: gag reflex present XI: bilateral shoulder shrug XII: midline tongue extension Motor: Right : Upper extremity   5/5    Left:     Upper extremity   5/5  Lower extremity   5/5     Lower extremity   5/5 Tone and bulk:normal tone throughout; no atrophy noted Sensory: Pinprick and light touch intact throughout, bilaterally Deep Tendon Reflexes: 2+ and symmetric throughout Plantars: Right: downgoing   Left: downgoing Cerebellar: normal finger-to-nose,  normal heel-to-shin test CV: pulses palpable throughout     Lab  Results: Lab Results  Component Value Date/Time   CHOL  Value: 130        ATP III CLASSIFICATION:  <200     mg/dL   Desirable  782-956  mg/dL   Borderline High  >=213    mg/dL   High        0/86/5784  6:48 PM   Lipid Panel No results found for this basename: CHOL,TRIG,HDL,CHOLHDL,VLDL,LDLCALC in the last 72 hours  Studies/Results: Ct Head Wo Contrast  12/16/2011  *RADIOLOGY REPORT*  Clinical Data: Seizure.  Head injury.  CT HEAD WITHOUT CONTRAST  Technique:  Contiguous axial images were obtained from the base of the skull through the vertex without contrast.  Comparison: CT 08/11/2011  Findings: Image quality degraded by motion with multiple repeats. Generalized atrophy and mild chronic microvascular ischemia. Negative for acute infarct.  Negative for hemorrhage or mass.  Right frontal soft tissue swelling.  Negative for skull fracture. Chronic sinusitis is present with thickening of the bone around the left sphenoid sinus.  There is mucosal thickening in the left frontal sinus.  IMPRESSION: Chronic microvascular ischemia.  No acute intracranial abnormality. Image quality degraded by motion.  Chronic sinusitis.   Original Report Authenticated By: Camelia Phenes, [name]    Portable Chest 1 View  12/17/2011  *RADIOLOGY REPORT*  Clinical Data: Status post  seizure.  Altered mental status.  PORTABLE CHEST - 1 VIEW  Comparison: Plain film of the chest 08/11/2011 and 12/16/2011.  Findings: Lungs are clear.  Heart size is normal.  No pneumothorax or pleural fluid.  IMPRESSION: No acute disease.   Original Report Authenticated By: Bernadene Bell. Maricela Curet, [name]    Dg Chest Port 1 View  12/16/2011  *RADIOLOGY REPORT*  Clinical Data: Short of breath.  Seizure  PORTABLE CHEST - 1 VIEW  Comparison: 08/11/2011  Findings: Mild left lower lobe airspace disease which may represent atelectasis or pneumonia.  Right lung is clear.  Negative for heart failure or effusion.  IMPRESSION: Mild left lower lobe  atelectasis/infiltrate.   Original Report Authenticated By: Camelia Phenes, [name]     Medications:     Scheduled:   . sodium chloride   Intravenous Once  . amLODipine  5 mg Oral Daily  . enoxaparin (LOVENOX) injection  40 mg Subcutaneous Q24H  . folic acid  1 mg Oral Once  . folic acid  1 mg Oral Daily  . lisinopril  10 mg Oral Daily  . multivitamin with minerals  1 tablet Oral Daily  . phenytoin (DILANTIN) IV  200 mg Intravenous Once  . phenytoin  100 mg Oral TID  . sertraline  200 mg Oral Daily  . sodium chloride  1,000 mL Intravenous Once  . sodium chloride  3 mL Intravenous Q12H  . thiamine  100 mg Oral Daily  . DISCONTD: folic acid  1 mg Oral Daily  . DISCONTD: LORazepam  2 mg Intravenous Once  . DISCONTD: thiamine  100 mg Intravenous Daily  . DISCONTD: thiamine  100 mg Oral Daily    Assessment/Plan:    Patient Active Hospital Problem List: Seizure, convulsive (08/11/2011)   Assessment: 66 YO male with history of seizure on Dilantin and self proclaims to be non compliant. Patient was brought to ED for seizure. No further seizures while in hospital. Dilantin level 12.0.     Plan: 1) Continue Dilantin 100 mg TID may change to 300 mg q HS 2) Stop ETOH intake.  3) No driving, operating heavy machinery, perform activities at heights, swimming or participation in water activities until release by outpatient physician.  This has been discussed with patient.    Neurology will S/O      Felicie Morn PA-C [facility] [phone]  12/17/2011, 10:44 AM

## 2011-12-18 NOTE — Discharge Summary (Signed)
Physician Discharge Summary  Miguel Hanson ION:[id number] DOB: Sep 18, 1945 DOA: 12/16/2011  PCP: Provider Not In System  Admit date: 12/16/2011 Discharge date: 12/18/2011  Recommendations for Outpatient Follow-up:  1. Follow up with PCP as an outpatient.  Discharge Diagnoses:  Principal Problem:  *Seizure, convulsive Active Problems:  Alcohol abuse  COPD (chronic obstructive pulmonary disease)  Altered mental status  HTN (hypertension), malignant  Hyponatremia   Discharge Condition: Stable  Diet recommendation: Diet  Filed Weights   12/18/11 0500  Weight: 48.172 kg (106 lb 3.2 oz)    History of present illness:  66 y.o. male with history of seizures, alcohol abuse, polysubstance abuse who presents with seizures. The history is obtained from ED staff and old records. It is reported out that the patient had a tonic clonic seizure at home today and was found by his wife on the floor close to the bed. EMS was called and they report that when they arrived he was seizing, and he was given IM Versed and his to seizing. He was transported to the ED and per EDP patient had another seizure while in the ED, he was given a dose of Dilantin 200 mg IV and neurology was consulted. Per EDP patient was post ictal following that. . At the time of my exam patient was alert, but confused and unable to answer any questions. Attempts were made to reach patient's wife but he was unavailable. A CT scan of his brain was done and showed chronic microvascular ischemia, no acute intracranial abnormality. Dilantin level was low at 8.1, urine drug screen was positive for marijuana and benzodiazepines. A chest x-ray was done and showed? Mild left lower lobe atelectasis/infiltrate-he was afebrile with no leukocytosis in the ED. She is admitted for further evaluation and management.  Hospital Course:  Seizure, convulsive (08/11/2011) -Dilantin level sub therapeutic. Likely noncompliance. , 11 was low on admission  to -Loaded with Dilantin. Continue Dilantin orally. -Alcohol level less than 2 days ago last drink.   Alcohol abuse (03/08/2011) -continue monitoring with [facility] . -Patient has no insight in his addiction. He relates he was to continue to drink as no set date for planned in quitting or cutting back on drinking. He relates he just forgets to take his medications when he is drinking. He relates he doesn't have any trouble getting it he just doesn't remember to take it. He also relays he was to continue drinking. Social programs was offered.  COPD (chronic obstructive pulmonary disease) (03/10/2011) -stable   Altered mental status (08/11/2011) -resolved probably secondary to seizures and alcohol.   -Neurology was consulted, CT scan is below.  HTN (hypertension), malignant (12/16/2011) -controlled was on high an admission, without any further treatment it came down to  Hyponatremia (12/16/2011) -continue IV fluids, check a basic metabolic panel in the morning   Procedures:  EEG which results are pending at this time to  Consultations:  Neurology  Discharge Exam: Filed Vitals:   12/18/11 0000 12/18/11 0400 12/18/11 0500 12/18/11 0800  BP: 130/83 150/94  138/84  Pulse: 80 77  77  Temp: 98.5 F (36.9 C) 98.5 F (36.9 C)  98.5 F (36.9 C)  TempSrc: Oral Oral  Oral  Resp: 20 18  18   Height:   6' (1.829 m)   Weight:   48.172 kg (106 lb 3.2 oz)   SpO2: 99% 98%  99%    General: Awake alert and oriented times Cardiovascular: Regular rate and rhythm Respiratory: Good air movement clear to auscultation  Discharge Instructions  Discharge Orders    Future Orders Please Complete By Expires   Diet - low sodium heart healthy      Increase activity slowly          Medication List     As of 12/18/2011  8:46 AM    TAKE these medications         amLODipine 5 MG tablet   Commonly known as: NORVASC   Take 5 mg by mouth daily.      folic acid 1 MG tablet   Commonly known as:  FOLVITE   Take 1 tablet (1 mg total) by mouth daily.      ibuprofen 400 MG tablet   Commonly known as: ADVIL,MOTRIN   Take 400 mg by mouth every 6 (six) hours as needed. For leg pain      lisinopril 20 MG tablet   Commonly known as: PRINIVIL,ZESTRIL   Take 10 mg by mouth daily.      miconazole 2 % powder   Commonly known as: MICOTIN   Apply 1 application topically as needed. For itching      phenytoin 100 MG ER capsule   Commonly known as: DILANTIN   Take 1 capsule (100 mg total) by mouth 3 (three) times daily.      sertraline 100 MG tablet   Commonly known as: ZOLOFT   Take 200 mg by mouth daily.      thiamine 100 MG tablet   Take 100 mg by mouth daily.      traZODone 50 MG tablet   Commonly known as: DESYREL   Take 50 mg by mouth at bedtime as needed. For sleep           Follow-up Information    Follow up with Provider Not In System.          The results of significant diagnostics from this hospitalization (including imaging, microbiology, ancillary and laboratory) are listed below for reference.    Significant Diagnostic Studies: Ct Head Wo Contrast  12/16/2011  *RADIOLOGY REPORT*  Clinical Data: Seizure.  Head injury.  CT HEAD WITHOUT CONTRAST  Technique:  Contiguous axial images were obtained from the base of the skull through the vertex without contrast.  Comparison: CT 08/11/2011  Findings: Image quality degraded by motion with multiple repeats. Generalized atrophy and mild chronic microvascular ischemia. Negative for acute infarct.  Negative for hemorrhage or mass.  Right frontal soft tissue swelling.  Negative for skull fracture. Chronic sinusitis is present with thickening of the bone around the left sphenoid sinus.  There is mucosal thickening in the left frontal sinus.  IMPRESSION: Chronic microvascular ischemia.  No acute intracranial abnormality. Image quality degraded by motion.  Chronic sinusitis.   Original Report Authenticated By: Camelia Phenes, [name]     Portable Chest 1 View  12/17/2011  *RADIOLOGY REPORT*  Clinical Data: Status post seizure.  Altered mental status.  PORTABLE CHEST - 1 VIEW  Comparison: Plain film of the chest 08/11/2011 and 12/16/2011.  Findings: Lungs are clear.  Heart size is normal.  No pneumothorax or pleural fluid.  IMPRESSION: No acute disease.   Original Report Authenticated By: Bernadene Bell. Maricela Curet, [name]    Dg Chest Port 1 View  12/16/2011  *RADIOLOGY REPORT*  Clinical Data: Short of breath.  Seizure  PORTABLE CHEST - 1 VIEW  Comparison: 08/11/2011  Findings: Mild left lower lobe airspace disease which may represent atelectasis or pneumonia.  Right lung is clear.  Negative for  heart failure or effusion.  IMPRESSION: Mild left lower lobe atelectasis/infiltrate.   Original Report Authenticated By: Camelia Phenes, [name]     Microbiology: Recent Results (from the past 240 hour(s))  URINE CULTURE     Status: Normal   Collection Time   12/16/11  6:20 PM      Component Value Range Status Comment   Specimen Description URINE, RANDOM   Final    Special Requests NONE   Final    Culture  Setup Time 12/16/2011 19:52   Final    Colony Count NO GROWTH   Final    Culture NO GROWTH   Final    Report Status 12/17/2011 FINAL   Final      Labs: Basic Metabolic Panel:  Lab 12/17/11 4540 12/16/11 1917 12/16/11 0834  NA 131* -- 133*  K 3.1* -- 3.5  CL 94* -- 95*  CO2 24 -- 23  GLUCOSE 94 -- 187*  BUN 3* -- 3*  CREATININE 0.49* 0.49* 0.64  CALCIUM 8.4 -- 8.8  MG -- -- 1.7  PHOS -- -- 2.8   Liver Function Tests:  Lab 12/16/11 1358  AST --  ALT --  ALKPHOS --  BILITOT --  PROT --  ALBUMIN 3.7   No results found for this basename: LIPASE:5,AMYLASE:5 in the last 168 hours No results found for this basename: AMMONIA:5 in the last 168 hours CBC:  Lab 12/17/11 0559 12/16/11 1917 12/16/11 0834  WBC 11.4* 9.2 4.1  NEUTROABS -- -- 2.4  HGB 12.6* 12.6* 13.2  HCT 33.5* 33.5* 35.1*  MCV 98.8 98.8 99.2  PLT 149* 144*  154   Cardiac Enzymes: No results found for this basename: CKTOTAL:5,CKMB:5,CKMBINDEX:5,TROPONINI:5 in the last 168 hours BNP: BNP (last 3 results) No results found for this basename: PROBNP:3 in the last 8760 hours CBG: No results found for this basename: GLUCAP:5 in the last 168 hours  Time coordinating discharge: *30 minutes  Signed:  Marinda Elk  [facility] 12/18/2011, 8:46 AM

## 2011-12-18 NOTE — Clinical Social Work Psychosocial (Signed)
     Clinical Social Work Department BRIEF PSYCHOSOCIAL ASSESSMENT 12/18/2011  Patient:  Miguel Hanson, Miguel Hanson     Account Number:  [id number]     Admit date:  12/16/2011  Clinical Social Worker:  Margaree Mackintosh  Date/Time:  12/18/2011 12:00 M  Referred by:  Physician  Date Referred:  12/18/2011 Referred for  Substance Abuse   Other Referral:   Interview type:  Patient Other interview type:    PSYCHOSOCIAL DATA Living Status:  FAMILY Admitted from facility:   Level of care:   Primary support name:  Dois Davenport: [phone] Primary support relationship to patient:  SPOUSE Degree of support available:   Unknown.    CURRENT CONCERNS Current Concerns  Substance Abuse   Other Concerns:    SOCIAL WORK ASSESSMENT / PLAN Clinical Social Worker recieved referral for substance abuse-please see SBIRT for additional details.  CSW reviewed chart and met with pt at bedside.  CSW introduced self, explained role, and provided support.  Per pt, he recieves support and assistance through the [facility] and plans to follow up with them at dc.  Pt states he has been able to decrease his ETOH consumption this past year.  Pt reports at the beginning of the year he would consume a "fifth of vodka" and now he consumes "half a pint of vodka".  Pt credits his support system and determination for achieving this.  Pt does not wish for additional resource information.  CSW to sign off, please re consult if needed.   Assessment/plan status:  No Further Intervention Required Other assessment/ plan:   Information/referral to community resources:   [employer]    PATIENTS/FAMILYS RESPONSE TO PLAN OF CARE: Pt was pleasant and engaged in conversation.  Pt thanked CSW for intervention.

## 2011-12-21 LAB — PHENYTOIN LEVEL, FREE AND TOTAL: Phenytoin Bound: 6.7 mg/L

## 2012-05-05 ENCOUNTER — Other Ambulatory Visit: Payer: Self-pay | Admitting: *Deleted

## 2012-05-15 ENCOUNTER — Emergency Department (HOSPITAL_COMMUNITY)
Admission: EM | Admit: 2012-05-15 | Discharge: 2012-05-15 | Disposition: A | Discharge status: Home / Self Care | Payer: Medicare Other | Attending: Emergency Medicine | Admitting: Emergency Medicine

## 2012-05-15 ENCOUNTER — Encounter (HOSPITAL_COMMUNITY): Payer: Self-pay | Admitting: Emergency Medicine

## 2012-05-15 ENCOUNTER — Emergency Department (HOSPITAL_COMMUNITY): Payer: Medicare Other

## 2012-05-15 DIAGNOSIS — F1210 Cannabis abuse, uncomplicated: Secondary | ICD-10-CM | POA: Insufficient documentation

## 2012-05-15 DIAGNOSIS — G40909 Epilepsy, unspecified, not intractable, without status epilepticus: Secondary | ICD-10-CM | POA: Insufficient documentation

## 2012-05-15 DIAGNOSIS — F4310 Post-traumatic stress disorder, unspecified: Secondary | ICD-10-CM | POA: Insufficient documentation

## 2012-05-15 DIAGNOSIS — F17200 Nicotine dependence, unspecified, uncomplicated: Secondary | ICD-10-CM | POA: Insufficient documentation

## 2012-05-15 DIAGNOSIS — Z79899 Other long term (current) drug therapy: Secondary | ICD-10-CM | POA: Insufficient documentation

## 2012-05-15 DIAGNOSIS — F29 Unspecified psychosis not due to a substance or known physiological condition: Secondary | ICD-10-CM | POA: Insufficient documentation

## 2012-05-15 DIAGNOSIS — I10 Essential (primary) hypertension: Secondary | ICD-10-CM | POA: Insufficient documentation

## 2012-05-15 DIAGNOSIS — Z91199 Patient's noncompliance with other medical treatment and regimen due to unspecified reason: Secondary | ICD-10-CM | POA: Insufficient documentation

## 2012-05-15 DIAGNOSIS — Z8679 Personal history of other diseases of the circulatory system: Secondary | ICD-10-CM | POA: Insufficient documentation

## 2012-05-15 LAB — URINALYSIS, ROUTINE W REFLEX MICROSCOPIC
Nitrite: NEGATIVE
Specific Gravity, Urine: 1.017 (ref 1.005–1.030)
Urobilinogen, UA: 1 mg/dL (ref 0.0–1.0)
pH: 6.5 (ref 5.0–8.0)

## 2012-05-15 LAB — ETHANOL: Alcohol, Ethyl (B): <11 mg/dL (ref 0–11)

## 2012-05-15 LAB — URINE MICROSCOPIC-ADD ON

## 2012-05-15 LAB — CBC WITH DIFFERENTIAL/PLATELET
Basophils Absolute: 0 10*3/uL (ref 0.0–0.1)
Basophils Relative: 1 % (ref 0–1)
Eosinophils Absolute: 0.2 10*3/uL (ref 0.0–0.7)
Hemoglobin: 11.5 g/dL — ABNORMAL LOW (ref 13.0–17.0)
MCH: 40.9 pg — ABNORMAL HIGH (ref 26.0–34.0)
MCHC: 32.9 g/dL (ref 30.0–36.0)
Monocytes Relative: 10 % (ref 3–12)
Neutro Abs: 2.1 10*3/uL (ref 1.7–7.7)
Neutrophils Relative %: 47 % (ref 43–77)
RDW: 18.8 % — ABNORMAL HIGH (ref 11.5–15.5)

## 2012-05-15 LAB — COMPREHENSIVE METABOLIC PANEL
AST: 99 U/L — ABNORMAL HIGH (ref 0–37)
Albumin: 3.7 g/dL (ref 3.5–5.2)
Alkaline Phosphatase: 80 U/L (ref 39–117)
BUN: 4 mg/dL — ABNORMAL LOW (ref 6–23)
Chloride: 98 mEq/L (ref 96–112)
Creatinine, Ser: 0.44 mg/dL — ABNORMAL LOW (ref 0.50–1.35)
Potassium: 3.8 mEq/L (ref 3.5–5.1)
Total Protein: 6.7 g/dL (ref 6.0–8.3)

## 2012-05-15 LAB — PHENYTOIN LEVEL, TOTAL: Phenytoin Lvl: <2.5 ug/mL — ABNORMAL LOW (ref 10.0–20.0)

## 2012-05-15 MED ORDER — SODIUM CHLORIDE 0.9 % IV BOLUS (SEPSIS)
1000.0000 mL | Freq: Once | INTRAVENOUS | Status: AC
  Administered 2012-05-15: 1000 mL via INTRAVENOUS

## 2012-05-15 MED ORDER — PHENYTOIN SODIUM EXTENDED 100 MG PO CAPS: 100.0000 mg | ORAL_CAPSULE | Freq: Three times a day (TID) | ORAL | Status: DC

## 2012-05-15 MED ORDER — LORAZEPAM 2 MG/ML IJ SOLN
1.0000 mg | Freq: Once | INTRAMUSCULAR | Status: AC
  Administered 2012-05-15: 1 mg via INTRAVENOUS
  Filled 2012-05-15: qty 1

## 2012-05-15 MED ORDER — SODIUM CHLORIDE 0.9 % IV SOLN
20.0000 mg/kg | Freq: Once | INTRAVENOUS | Status: AC
  Administered 2012-05-15: 1006 mg via INTRAVENOUS
  Filled 2012-05-15: qty 20.12

## 2012-05-15 NOTE — ED Provider Notes (Signed)
History     CSN: [id number]  Arrival date & time 05/15/12  7829   First MD Initiated Contact with Patient 05/15/12 [phone]      Chief Complaint  Patient presents with  . Seizures    (Consider location/radiation/quality/duration/timing/severity/associated sxs/prior treatment) HPI Comments: Patient reportedly had seizure activity at home today.  He is confused and unable to provide a history. He has a history of polysubstance abuse and alcohol abuse. Patient apparently has history of seizure disorder and takes dilantin. It is unclear whether he hit his head.  No reported fevers.  No family available.  Spoke with patient's wife on the phone who said she heard him fall out of bed and thinks that he had a seizure was tonic-clonic. It lasted for greater than a minute and resolved with Versed again by EMS. She states she's been noncompliant with his Dilantin. His last drink was yesterday.  The history is provided by the spouse and the EMS personnel.    Past Medical History  Diagnosis Date  . Hypertension   . Seizures   . ETOH abuse   . Drug abuse   . PTSD (post-traumatic stress disorder)   . H/O prolonged Q-T interval on ECG   . Polysubstance abuse   . Alcohol abuse     History reviewed. No pertinent past surgical history.  History reviewed. No pertinent family history.  History  Substance Use Topics  . Smoking status: Current Some Day Smoker -- 0.50 packs/day for 40 years    Types: Cigarettes  . Smokeless tobacco: Not on file  . Alcohol Use: Yes     Comment: abuse/binge      Review of Systems  Unable to perform ROS: Mental status change  Neurological: Positive for seizures.    Allergies  Review of patient's allergies indicates no known allergies.  Home Medications   Current Outpatient Rx  Name  Route  Sig  Dispense  Refill  . amLODipine (NORVASC) 5 MG tablet   Oral   Take 5 mg by mouth daily.         . folic acid (FOLVITE) 1 MG tablet   Oral   Take 1 tablet  (1 mg total) by mouth daily.   30 tablet   0   . ibuprofen (ADVIL,MOTRIN) 400 MG tablet   Oral   Take 400 mg by mouth every 6 (six) hours as needed. For leg pain         . lisinopril (PRINIVIL,ZESTRIL) 20 MG tablet   Oral   Take 10 mg by mouth daily.          . miconazole (MICOTIN) 2 % powder   Topical   Apply 1 application topically as needed. For itching         . phenytoin (DILANTIN) 100 MG ER capsule   Oral   Take 1 capsule (100 mg total) by mouth 3 (three) times daily.   35 capsule   0   . sertraline (ZOLOFT) 100 MG tablet   Oral   Take 200 mg by mouth daily.         Marland Kitchen thiamine 100 MG tablet   Oral   Take 100 mg by mouth daily.         . traZODone (DESYREL) 50 MG tablet   Oral   Take 50 mg by mouth at bedtime as needed. For sleep           BP 155/96  Pulse 88  Temp(Src) 98.5 F (36.9 C) (  Oral)  Resp 18  Ht 5\' 11"  (1.803 m)  Wt 111 lb (50.349 kg)  BMI 15.49 kg/m2  SpO2 99%  Physical Exam  Constitutional: He is oriented to person, place, and time. He appears well-developed and well-nourished.  Confused, unable to provide meaningful history does not answer questions appropriately No evidence of trauma  HENT:  Head: Normocephalic and atraumatic.  Mouth/Throat: Oropharynx is clear and moist. No oropharyngeal exudate.  Eyes: Conjunctivae and EOM are normal. Pupils are equal, round, and reactive to light.  Neck: Normal range of motion. Neck supple.  Cardiovascular: Normal rate, regular rhythm and normal heart sounds.   Pulmonary/Chest: Effort normal and breath sounds normal. No respiratory distress.  Abdominal: Soft. There is no tenderness. There is no rebound and no guarding.  Musculoskeletal: Normal range of motion. He exhibits no edema and no tenderness.  Neurological: He is alert and oriented to person, place, and time. No cranial nerve deficit. He exhibits normal muscle tone. Coordination normal.  Moving all extremities, follows commands  intermittently. No asterixis.  Skin: Skin is warm.    ED Course  Procedures (including critical care time)  Labs Reviewed  CBC WITH DIFFERENTIAL - Abnormal; Notable for the following:    RBC 2.81 (*)    Hemoglobin 11.5 (*)    HCT 35.0 (*)    MCV 124.6 (*)    MCH 40.9 (*)    RDW 18.8 (*)    Platelets 131 (*)    All other components within normal limits  COMPREHENSIVE METABOLIC PANEL - Abnormal; Notable for the following:    Sodium 134 (*)    Glucose, Bld 108 (*)    BUN 4 (*)    Creatinine, Ser 0.44 (*)    AST 99 (*)    Total Bilirubin 1.3 (*)    All other components within normal limits  PHENYTOIN LEVEL, TOTAL - Abnormal; Notable for the following:    Phenytoin Lvl <2.5 (*)    All other components within normal limits  URINALYSIS, ROUTINE W REFLEX MICROSCOPIC - Abnormal; Notable for the following:    APPearance CLOUDY (*)    Glucose, UA 100 (*)    Protein, ur 30 (*)    All other components within normal limits  ETHANOL  URINE MICROSCOPIC-ADD ON   Dg Chest 2 View  05/15/2012  *RADIOLOGY REPORT*  Clinical Data: Seizures.  Smoker.  CHEST - 2 VIEW  Comparison: 10/17/2013and 08/11/2011  Findings: Pulmonary hyperinflation again seen, consistent with COPD.  Mild cardiomegaly stable.  Both lungs are clear.  No evidence of pleural effusion.  No mass or lymphadenopathy identified.  Several mid thoracic vertebral body compression fractures are again noted.  IMPRESSION: Stable mild cardiomegaly and COPD.  No active disease.   Original Report Authenticated By: Myles Rosenthal, [name]    Ct Head Wo Contrast  05/15/2012  *RADIOLOGY REPORT*  Clinical Data: Seizure  CT HEAD WITHOUT CONTRAST  Technique:  Contiguous axial images were obtained from the base of the skull through the vertex without contrast.  Comparison: 12/16/2011  Findings: Chronic ischemic changes.  Mild global atrophy.  No mass effect, midline shift, or acute intracranial hemorrhage.  Soft tissue thickening over the left frontal bone  is stable.  Minimal mucosal thickening in the ethmoid air cells.  Opacification of the left frontal sinus unchanged.   Minimal fluid in the left mastoid air cells.  This is stable. Nasal bone deformity is stable.  IMPRESSION: No acute intracranial pathology.  Stable chronic findings.   Original  Report Authenticated By: Jolaine Click, [name]      No diagnosis found.    MDM  Seizure activity with history of seizure disorder. History of alcohol abuse.  No evidence of acute alcohol withdrawal.  On reassessment at 8:45 AM, patient is alert and oriented x3. He denies any complaints. He states he may have had a seizure. He states he has not had his Dilantin in the past 2 days. He says his last alcoholic drink was 3 days ago.  Patient has a subtherapeutic Dilantin level. He is loaded with dilantin. No further seizure activity in ED. There is no evidence of alcohol withdrawal. He appears to be at his baseline. On reassessment, patient is alert and oriented x 3.  Denies tremors or complaints.  No tachycardia or hypertension. He is ambulatory without assistance.   Date: 05/15/2012  Rate: 97  Rhythm: normal sinus rhythm  QRS Axis: normal  Intervals: normal  ST/T Wave abnormalities: normal  Conduction Disutrbances:none  Narrative Interpretation:   Old EKG Reviewed: unchanged       Glynn Octave, MD 05/15/12 [phone]

## 2012-05-15 NOTE — ED Notes (Signed)
Pt ambulated in hallway with no issues. 

## 2012-05-15 NOTE — ED Notes (Signed)
Pt arrived via EMS with a complaint of a witnessed seizure.  Pt's family member relayed to EMS that the seizure lasted 60 seconds.  Pt was combative upon EMS arrival.  EMS administered Midazolam 2.5mg .  Pt is disorientated x3 Pupils are PERRLA. Skin is flush and warm.  Pt is slightly diaphoretic.

## 2012-05-15 NOTE — ED Notes (Signed)
ONG:[id number]<WU> Expected date:05/15/12<BR> Expected time: 6:32 AM<BR> Means of arrival:Ambulance<BR> Comments:<BR> RM 20 : ETOH, Seizures hx of same

## 2012-05-15 NOTE — ED Notes (Signed)
Patient transported to CT 

## 2012-08-13 ENCOUNTER — Encounter (HOSPITAL_COMMUNITY): Payer: Self-pay | Admitting: Emergency Medicine

## 2012-08-13 ENCOUNTER — Emergency Department (HOSPITAL_COMMUNITY)
Admission: EM | Admit: 2012-08-13 | Discharge: 2012-08-13 | Disposition: A | Discharge status: Home / Self Care | Payer: Medicare Other | Attending: Emergency Medicine | Admitting: Emergency Medicine

## 2012-08-13 DIAGNOSIS — Z8659 Personal history of other mental and behavioral disorders: Secondary | ICD-10-CM | POA: Insufficient documentation

## 2012-08-13 DIAGNOSIS — G40909 Epilepsy, unspecified, not intractable, without status epilepticus: Secondary | ICD-10-CM | POA: Insufficient documentation

## 2012-08-13 DIAGNOSIS — I10 Essential (primary) hypertension: Secondary | ICD-10-CM

## 2012-08-13 DIAGNOSIS — Z8679 Personal history of other diseases of the circulatory system: Secondary | ICD-10-CM | POA: Insufficient documentation

## 2012-08-13 DIAGNOSIS — F1910 Other psychoactive substance abuse, uncomplicated: Secondary | ICD-10-CM | POA: Insufficient documentation

## 2012-08-13 DIAGNOSIS — F17200 Nicotine dependence, unspecified, uncomplicated: Secondary | ICD-10-CM | POA: Insufficient documentation

## 2012-08-13 DIAGNOSIS — F1010 Alcohol abuse, uncomplicated: Secondary | ICD-10-CM | POA: Insufficient documentation

## 2012-08-13 DIAGNOSIS — Z79899 Other long term (current) drug therapy: Secondary | ICD-10-CM | POA: Insufficient documentation

## 2012-08-13 DIAGNOSIS — R569 Unspecified convulsions: Secondary | ICD-10-CM

## 2012-08-13 MED ORDER — AMLODIPINE BESYLATE 5 MG PO TABS
5.0000 mg | ORAL_TABLET | Freq: Once | ORAL | Status: AC
  Administered 2012-08-13: 5 mg via ORAL
  Filled 2012-08-13: qty 1

## 2012-08-13 MED ORDER — SODIUM CHLORIDE 0.9 % IV SOLN
1000.0000 mg | Freq: Once | INTRAVENOUS | Status: AC
  Administered 2012-08-13: 1000 mg via INTRAVENOUS
  Filled 2012-08-13: qty 20

## 2012-08-13 NOTE — ED Provider Notes (Signed)
History     CSN: [id number]  Arrival date & time 08/13/12  4098   First MD Initiated Contact with Patient 08/13/12 0759      Chief Complaint  Patient presents with  . Seizures    (Consider location/radiation/quality/duration/timing/severity/associated sxs/prior treatment) Patient is a 67 y.o. male presenting with seizures. The history is provided by the patient and the EMS personnel.  Seizures Seizure activity on arrival: no   Seizure type:  Grand mal Preceding symptoms: no sensation of an aura present and no headache   Initial focality:  Diffuse Episode characteristics: disorientation and generalized shaking   Postictal symptoms: confusion   Return to baseline: yes   Severity:  Moderate Timing:  Once Progression:  Resolved Context: medical non-compliance   Recent head injury:  No recent head injuries PTA treatment:  None History of seizures: yes   H/o epilepsy--non-compliant with all meds x 1 week due to etoh abuse--ems called by roomates, pt posticatl on their arrival, cbg >100, pt transported here  Past Medical History  Diagnosis Date  . Hypertension   . Seizures   . ETOH abuse   . Drug abuse   . PTSD (post-traumatic stress disorder)   . H/O prolonged Q-T interval on ECG   . Polysubstance abuse   . Alcohol abuse     History reviewed. No pertinent past surgical history.  No family history on file.  History  Substance Use Topics  . Smoking status: Current Some Day Smoker -- 0.50 packs/day for 40 years    Types: Cigarettes  . Smokeless tobacco: Not on file  . Alcohol Use: Yes     Comment: abuse/binge      Review of Systems  Neurological: Positive for seizures.  All other systems reviewed and are negative.    Allergies  Review of patient's allergies indicates no known allergies.  Home Medications   Current Outpatient Rx  Name  Route  Sig  Dispense  Refill  . amLODipine (NORVASC) 5 MG tablet   Oral   Take 5 mg by mouth daily.         Marland Kitchen  ibuprofen (ADVIL,MOTRIN) 400 MG tablet   Oral   Take 400 mg by mouth every 6 (six) hours as needed. For leg pain         . EXPIRED: lisinopril (PRINIVIL,ZESTRIL) 20 MG tablet   Oral   Take 10 mg by mouth daily.          . miconazole (MICOTIN) 2 % powder   Topical   Apply 1 application topically as needed. For itching         . phenytoin (DILANTIN) 100 MG ER capsule   Oral   Take 1 capsule (100 mg total) by mouth 3 (three) times daily.   60 capsule   0   . sertraline (ZOLOFT) 100 MG tablet   Oral   Take 200 mg by mouth daily.         . traZODone (DESYREL) 50 MG tablet   Oral   Take 50 mg by mouth at bedtime as needed. For sleep           BP 141/97  Temp(Src) 98.4 F (36.9 C) (Oral)  Resp 15  SpO2 98%  Physical Exam  Nursing note and vitals reviewed. Constitutional: He is oriented to person, place, and time. He appears well-developed and well-nourished.  Non-toxic appearance. No distress.  HENT:  Head: Normocephalic and atraumatic.  Eyes: Conjunctivae, EOM and lids are normal. Pupils are equal,  round, and reactive to light.  Neck: Normal range of motion. Neck supple. No tracheal deviation present. No mass present.  Cardiovascular: Normal rate, regular rhythm and normal heart sounds.  Exam reveals no gallop.   No murmur heard. Pulmonary/Chest: Effort normal and breath sounds normal. No stridor. No respiratory distress. He has no decreased breath sounds. He has no wheezes. He has no rhonchi. He has no rales.  Abdominal: Soft. Normal appearance and bowel sounds are normal. He exhibits no distension. There is no tenderness. There is no rebound and no CVA tenderness.  Musculoskeletal: Normal range of motion. He exhibits no edema and no tenderness.  Neurological: He is alert and oriented to person, place, and time. He has normal strength. No cranial nerve deficit or sensory deficit. GCS eye subscore is 4. GCS verbal subscore is 5. GCS motor subscore is 6.  Skin: Skin  is warm and dry. No abrasion and no rash noted.  Psychiatric: He has a normal mood and affect. His speech is normal and behavior is normal.    ED Course  Procedures (including critical care time)  Labs Reviewed  PHENYTOIN LEVEL, TOTAL   No results found.   No diagnosis found.    MDM  Pt given dilantin 1 gram here and meds for htn, blood pressure stable and no seziure activity, stable for d/c--has meds at home        Toy Baker, MD 08/13/12 1101

## 2012-08-13 NOTE — ED Notes (Signed)
EXB:[id number]<LK> Expected date:<BR> Expected time:<BR> Means of arrival:<BR> Comments:<BR> EMS seizures

## 2012-08-13 NOTE — ED Notes (Signed)
Per EMS pt comes from home after having seizure.  Pt takes dilantin. Per pt's room mate pt tends to have seizures when he consumes ETOH and pt has been heavily consuming alcohol esp last night.  Pt also told EMS that he hasnt been taking his BP meds as prescribed.

## 2012-08-16 LAB — POCT I-STAT, CHEM 8
BUN: 3 mg/dL — ABNORMAL LOW (ref 6–23)
Calcium, Ion: 1.08 mmol/L — ABNORMAL LOW (ref 1.13–1.30)
Chloride: 101 mEq/L (ref 96–112)
Creatinine, Ser: 0.5 mg/dL (ref 0.50–1.35)
Glucose, Bld: 123 mg/dL — ABNORMAL HIGH (ref 70–99)

## 2012-12-11 ENCOUNTER — Inpatient Hospital Stay (HOSPITAL_COMMUNITY): Payer: Medicare Other

## 2012-12-11 ENCOUNTER — Emergency Department (HOSPITAL_COMMUNITY): Payer: Medicare Other

## 2012-12-11 ENCOUNTER — Encounter (HOSPITAL_COMMUNITY): Payer: Self-pay | Admitting: Emergency Medicine

## 2012-12-11 ENCOUNTER — Inpatient Hospital Stay (HOSPITAL_COMMUNITY)
Admission: EM | Admit: 2012-12-11 | Discharge: 2012-12-13 | DRG: 100 | Disposition: A | Discharge status: Home / Self Care | Payer: Medicare Other | Attending: Internal Medicine | Admitting: Internal Medicine

## 2012-12-11 DIAGNOSIS — Z87898 Personal history of other specified conditions: Secondary | ICD-10-CM

## 2012-12-11 DIAGNOSIS — G40901 Epilepsy, unspecified, not intractable, with status epilepticus: Secondary | ICD-10-CM

## 2012-12-11 DIAGNOSIS — D539 Nutritional anemia, unspecified: Secondary | ICD-10-CM | POA: Diagnosis present

## 2012-12-11 DIAGNOSIS — J449 Chronic obstructive pulmonary disease, unspecified: Secondary | ICD-10-CM

## 2012-12-11 DIAGNOSIS — E872 Acidosis, unspecified: Secondary | ICD-10-CM | POA: Diagnosis present

## 2012-12-11 DIAGNOSIS — E46 Unspecified protein-calorie malnutrition: Secondary | ICD-10-CM | POA: Diagnosis present

## 2012-12-11 DIAGNOSIS — R4182 Altered mental status, unspecified: Secondary | ICD-10-CM

## 2012-12-11 DIAGNOSIS — Z79899 Other long term (current) drug therapy: Secondary | ICD-10-CM

## 2012-12-11 DIAGNOSIS — R569 Unspecified convulsions: Secondary | ICD-10-CM

## 2012-12-11 DIAGNOSIS — I498 Other specified cardiac arrhythmias: Secondary | ICD-10-CM | POA: Diagnosis present

## 2012-12-11 DIAGNOSIS — E871 Hypo-osmolality and hyponatremia: Secondary | ICD-10-CM

## 2012-12-11 DIAGNOSIS — G40401 Other generalized epilepsy and epileptic syndromes, not intractable, with status epilepticus: Principal | ICD-10-CM | POA: Diagnosis present

## 2012-12-11 DIAGNOSIS — Z681 Body mass index (BMI) 19 or less, adult: Secondary | ICD-10-CM

## 2012-12-11 DIAGNOSIS — J9600 Acute respiratory failure, unspecified whether with hypoxia or hypercapnia: Secondary | ICD-10-CM

## 2012-12-11 DIAGNOSIS — J9690 Respiratory failure, unspecified, unspecified whether with hypoxia or hypercapnia: Secondary | ICD-10-CM

## 2012-12-11 DIAGNOSIS — F1010 Alcohol abuse, uncomplicated: Secondary | ICD-10-CM | POA: Diagnosis present

## 2012-12-11 DIAGNOSIS — I10 Essential (primary) hypertension: Secondary | ICD-10-CM

## 2012-12-11 DIAGNOSIS — F1210 Cannabis abuse, uncomplicated: Secondary | ICD-10-CM | POA: Diagnosis present

## 2012-12-11 DIAGNOSIS — F1011 Alcohol abuse, in remission: Secondary | ICD-10-CM

## 2012-12-11 DIAGNOSIS — J9601 Acute respiratory failure with hypoxia: Secondary | ICD-10-CM

## 2012-12-11 DIAGNOSIS — R32 Unspecified urinary incontinence: Secondary | ICD-10-CM | POA: Diagnosis present

## 2012-12-11 DIAGNOSIS — R7309 Other abnormal glucose: Secondary | ICD-10-CM | POA: Diagnosis present

## 2012-12-11 DIAGNOSIS — Z9119 Patient's noncompliance with other medical treatment and regimen: Secondary | ICD-10-CM

## 2012-12-11 DIAGNOSIS — R64 Cachexia: Secondary | ICD-10-CM | POA: Diagnosis present

## 2012-12-11 DIAGNOSIS — Z91199 Patient's noncompliance with other medical treatment and regimen due to unspecified reason: Secondary | ICD-10-CM

## 2012-12-11 DIAGNOSIS — F17200 Nicotine dependence, unspecified, uncomplicated: Secondary | ICD-10-CM | POA: Diagnosis present

## 2012-12-11 DIAGNOSIS — J329 Chronic sinusitis, unspecified: Secondary | ICD-10-CM

## 2012-12-11 DIAGNOSIS — F4310 Post-traumatic stress disorder, unspecified: Secondary | ICD-10-CM

## 2012-12-11 LAB — POCT I-STAT 3, ART BLOOD GAS (G3+)
Acid-base deficit: 4 mmol/L — ABNORMAL HIGH (ref 0.0–2.0)
Bicarbonate: 22.1 mEq/L (ref 20.0–24.0)
O2 Saturation: 100 %
Patient temperature: 98.6
TCO2: 23 mmol/L (ref 0–100)
pCO2 arterial: 41 mmHg (ref 35.0–45.0)
pH, Arterial: 7.339 — ABNORMAL LOW (ref 7.350–7.450)
pO2, Arterial: 338 mmHg — ABNORMAL HIGH (ref 80.0–100.0)

## 2012-12-11 LAB — COMPREHENSIVE METABOLIC PANEL
ALT: 15 U/L (ref 0–53)
AST: 56 U/L — ABNORMAL HIGH (ref 0–37)
Alkaline Phosphatase: 88 U/L (ref 39–117)
CO2: 19 mEq/L (ref 19–32)
Calcium: 10 mg/dL (ref 8.4–10.5)
Chloride: 96 mEq/L (ref 96–112)
Creatinine, Ser: 0.54 mg/dL (ref 0.50–1.35)
GFR calc non Af Amer: >90 mL/min (ref >90)
Potassium: 4.8 mEq/L (ref 3.5–5.1)
Sodium: 140 mEq/L (ref 135–145)
Total Bilirubin: 0.8 mg/dL (ref 0.3–1.2)
Total Protein: 8.5 g/dL — ABNORMAL HIGH (ref 6.0–8.3)

## 2012-12-11 LAB — TROPONIN I
Troponin I: <0.3 ng/mL (ref <0.30)
Troponin I: <0.3 ng/mL (ref <0.30)

## 2012-12-11 LAB — CBC
HCT: 36.2 % — ABNORMAL LOW (ref 39.0–52.0)
Hemoglobin: 12.8 g/dL — ABNORMAL LOW (ref 13.0–17.0)
Hemoglobin: 12.3 g/dL — ABNORMAL LOW (ref 13.0–17.0)
MCH: 41.6 pg — ABNORMAL HIGH (ref 26.0–34.0)
MCH: 40.9 pg — ABNORMAL HIGH (ref 26.0–34.0)
MCHC: 37.2 g/dL — ABNORMAL HIGH (ref 30.0–36.0)
MCV: 109 fL — ABNORMAL HIGH (ref 78.0–100.0)
MCV: 107 fL — ABNORMAL HIGH (ref 78.0–100.0)
Platelets: 139 10*3/uL — ABNORMAL LOW (ref 150–400)
Platelets: 124 10*3/uL — ABNORMAL LOW (ref 150–400)
RBC: 3.32 MIL/uL — ABNORMAL LOW (ref 4.22–5.81)
RBC: 3.01 MIL/uL — ABNORMAL LOW (ref 4.22–5.81)
RDW: 16.1 % — ABNORMAL HIGH (ref 11.5–15.5)
WBC: 6.3 10*3/uL (ref 4.0–10.5)
WBC: 7.1 10*3/uL (ref 4.0–10.5)

## 2012-12-11 LAB — PHENYTOIN LEVEL, TOTAL
Phenytoin Lvl: <2.5 ug/mL — ABNORMAL LOW (ref 10.0–20.0)
Phenytoin Lvl: 47.1 ug/mL (ref 10.0–20.0)
Phenytoin Lvl: 22.2 ug/mL — ABNORMAL HIGH (ref 10.0–20.0)

## 2012-12-11 LAB — URINALYSIS, ROUTINE W REFLEX MICROSCOPIC
Bilirubin Urine: NEGATIVE
Glucose, UA: 100 mg/dL — AB
Ketones, ur: NEGATIVE mg/dL
Leukocytes, UA: NEGATIVE
Nitrite: NEGATIVE
Protein, ur: NEGATIVE mg/dL
Specific Gravity, Urine: 1.013 (ref 1.005–1.030)
Urobilinogen, UA: 0.2 mg/dL (ref 0.0–1.0)
pH: 6 (ref 5.0–8.0)

## 2012-12-11 LAB — POCT I-STAT, CHEM 8
BUN: <3 mg/dL — ABNORMAL LOW (ref 6–23)
Calcium, Ion: 1.28 mmol/L (ref 1.13–1.30)
Chloride: 103 mEq/L (ref 96–112)
Creatinine, Ser: 0.5 mg/dL (ref 0.50–1.35)
Glucose, Bld: 148 mg/dL — ABNORMAL HIGH (ref 70–99)
HCT: 44 % (ref 39.0–52.0)
Hemoglobin: 15 g/dL (ref 13.0–17.0)
Potassium: 4.7 mEq/L (ref 3.5–5.1)
Sodium: 141 mEq/L (ref 135–145)
TCO2: 23 mmol/L (ref 0–100)

## 2012-12-11 LAB — MAGNESIUM: Magnesium: 1.1 mg/dL — ABNORMAL LOW (ref 1.5–2.5)

## 2012-12-11 LAB — POCT I-STAT TROPONIN I: Troponin i, poc: 0 ng/mL (ref 0.00–0.08)

## 2012-12-11 LAB — COMPREHENSIVE METABOLIC PANEL WITH GFR
Albumin: 4.6 g/dL (ref 3.5–5.2)
BUN: 4 mg/dL — ABNORMAL LOW (ref 6–23)
GFR calc Af Amer: >90 mL/min (ref >90)
Glucose, Bld: 146 mg/dL — ABNORMAL HIGH (ref 70–99)

## 2012-12-11 LAB — CG4 I-STAT (LACTIC ACID): Lactic Acid, Venous: 14.1 mmol/L — ABNORMAL HIGH (ref 0.5–2.2)

## 2012-12-11 LAB — ETHANOL: Alcohol, Ethyl (B): <11 mg/dL (ref 0–11)

## 2012-12-11 LAB — CREATININE, SERUM: GFR calc Af Amer: >90 mL/min (ref >90)

## 2012-12-11 LAB — PHOSPHORUS: Phosphorus: 2.3 mg/dL (ref 2.3–4.6)

## 2012-12-11 LAB — GLUCOSE, CAPILLARY
Glucose-Capillary: 186 mg/dL — ABNORMAL HIGH (ref 70–99)
Glucose-Capillary: 96 mg/dL (ref 70–99)

## 2012-12-11 LAB — URINE MICROSCOPIC-ADD ON

## 2012-12-11 LAB — PROTIME-INR
INR: 1.23 (ref 0.00–1.49)
Prothrombin Time: 15.2 s (ref 11.6–15.2)

## 2012-12-11 LAB — MRSA PCR SCREENING: MRSA by PCR: NEGATIVE

## 2012-12-11 MED ORDER — ALBUTEROL SULFATE (5 MG/ML) 0.5% IN NEBU
2.5000 mg | INHALATION_SOLUTION | Freq: Four times a day (QID) | RESPIRATORY_TRACT | Status: DC
  Administered 2012-12-11 – 2012-12-12 (×4): 2.5 mg via RESPIRATORY_TRACT
  Filled 2012-12-11 (×4): qty 0.5

## 2012-12-11 MED ORDER — MIDAZOLAM BOLUS VIA INFUSION
1.0000 mg | INTRAVENOUS | Status: DC | PRN
  Filled 2012-12-11: qty 2

## 2012-12-11 MED ORDER — BIOTENE DRY MOUTH MT LIQD
15.0000 mL | Freq: Four times a day (QID) | OROMUCOSAL | Status: DC
  Administered 2012-12-11 – 2012-12-12 (×4): 15 mL via OROMUCOSAL

## 2012-12-11 MED ORDER — MAGNESIUM SULFATE 40 MG/ML IJ SOLN
4.0000 g | Freq: Once | INTRAMUSCULAR | Status: AC
  Administered 2012-12-11: 4 g via INTRAVENOUS
  Filled 2012-12-11: qty 100

## 2012-12-11 MED ORDER — SODIUM CHLORIDE 0.9 % IV SOLN
1000.0000 mg | INTRAVENOUS | Status: AC
  Administered 2012-12-11: 1000 mg via INTRAVENOUS
  Filled 2012-12-11: qty 20

## 2012-12-11 MED ORDER — SUCCINYLCHOLINE CHLORIDE 20 MG/ML IJ SOLN
80.0000 mg | Freq: Once | INTRAMUSCULAR | Status: AC
  Administered 2012-12-11: 80 mg via INTRAVENOUS
  Filled 2012-12-11: qty 4

## 2012-12-11 MED ORDER — FENTANYL BOLUS VIA INFUSION
25.0000 ug | Freq: Four times a day (QID) | INTRAVENOUS | Status: DC | PRN
  Filled 2012-12-11: qty 100

## 2012-12-11 MED ORDER — CHLORHEXIDINE GLUCONATE 0.12 % MT SOLN
15.0000 mL | Freq: Two times a day (BID) | OROMUCOSAL | Status: DC
  Administered 2012-12-11 – 2012-12-12 (×3): 15 mL via OROMUCOSAL
  Filled 2012-12-11 (×3): qty 15

## 2012-12-11 MED ORDER — ONDANSETRON HCL 4 MG/2ML IJ SOLN
INTRAMUSCULAR | Status: AC
  Filled 2012-12-11: qty 2

## 2012-12-11 MED ORDER — PHENYTOIN SODIUM 50 MG/ML IJ SOLN
100.0000 mg | Freq: Three times a day (TID) | INTRAMUSCULAR | Status: DC
  Filled 2012-12-11 (×3): qty 2

## 2012-12-11 MED ORDER — PROPOFOL 10 MG/ML IV EMUL
5.0000 ug/kg/min | INTRAVENOUS | Status: DC
  Administered 2012-12-11: 40 ug/kg/min via INTRAVENOUS
  Administered 2012-12-12: 20 ug/kg/min via INTRAVENOUS
  Filled 2012-12-11 (×2): qty 100

## 2012-12-11 MED ORDER — PROPOFOL 10 MG/ML IV EMUL
INTRAVENOUS | Status: AC
  Filled 2012-12-11: qty 100

## 2012-12-11 MED ORDER — SODIUM CHLORIDE 0.9 % IV SOLN
500.0000 mg | Freq: Two times a day (BID) | INTRAVENOUS | Status: DC
  Filled 2012-12-11: qty 5

## 2012-12-11 MED ORDER — SODIUM CHLORIDE 0.9 % IV SOLN
INTRAVENOUS | Status: DC
  Administered 2012-12-11 – 2012-12-12 (×3): via INTRAVENOUS

## 2012-12-11 MED ORDER — PROPOFOL 10 MG/ML IV EMUL
5.0000 ug/kg/min | Freq: Once | INTRAVENOUS | Status: DC
  Administered 2012-12-11: 10 ug/kg/min via INTRAVENOUS

## 2012-12-11 MED ORDER — PHENYTOIN SODIUM 50 MG/ML IJ SOLN
100.0000 mg | Freq: Three times a day (TID) | INTRAMUSCULAR | Status: DC
  Administered 2012-12-11 – 2012-12-12 (×2): 100 mg via INTRAVENOUS
  Filled 2012-12-11 (×5): qty 2

## 2012-12-11 MED ORDER — METOPROLOL TARTRATE 1 MG/ML IV SOLN: 2.5000 mg | INTRAVENOUS | Status: DC | PRN

## 2012-12-11 MED ORDER — LORAZEPAM 2 MG/ML IJ SOLN
INTRAMUSCULAR | Status: AC
  Filled 2012-12-11: qty 1

## 2012-12-11 MED ORDER — SODIUM CHLORIDE 0.9 % IV SOLN
25.0000 ug/h | INTRAVENOUS | Status: DC
  Filled 2012-12-11: qty 50

## 2012-12-11 MED ORDER — ONDANSETRON HCL 4 MG/2ML IJ SOLN
4.0000 mg | Freq: Once | INTRAMUSCULAR | Status: AC
  Administered 2012-12-11: 4 mg via INTRAVENOUS

## 2012-12-11 MED ORDER — PANTOPRAZOLE SODIUM 40 MG IV SOLR
40.0000 mg | Freq: Every day | INTRAVENOUS | Status: DC
  Administered 2012-12-11: 40 mg via INTRAVENOUS
  Filled 2012-12-11 (×2): qty 40

## 2012-12-11 MED ORDER — IPRATROPIUM BROMIDE 0.02 % IN SOLN
0.5000 mg | Freq: Four times a day (QID) | RESPIRATORY_TRACT | Status: DC
  Administered 2012-12-11 – 2012-12-12 (×4): 0.5 mg via RESPIRATORY_TRACT
  Filled 2012-12-11 (×4): qty 2.5

## 2012-12-11 MED ORDER — HEPARIN SODIUM (PORCINE) 5000 UNIT/ML IJ SOLN
5000.0000 [IU] | Freq: Three times a day (TID) | INTRAMUSCULAR | Status: DC
  Administered 2012-12-11 – 2012-12-12 (×3): 5000 [IU] via SUBCUTANEOUS
  Filled 2012-12-11 (×6): qty 1

## 2012-12-11 MED ORDER — LORAZEPAM 2 MG/ML IJ SOLN
2.0000 mg | Freq: Once | INTRAMUSCULAR | Status: AC
  Administered 2012-12-11: 2 mg via INTRAVENOUS

## 2012-12-11 MED ORDER — ETOMIDATE 2 MG/ML IV SOLN
20.0000 mg | Freq: Once | INTRAVENOUS | Status: AC
  Administered 2012-12-11: 20 mg via INTRAVENOUS

## 2012-12-11 MED ORDER — INSULIN ASPART 100 UNIT/ML ~~LOC~~ SOLN
1.0000 [IU] | SUBCUTANEOUS | Status: DC
  Administered 2012-12-11: 2 [IU] via SUBCUTANEOUS

## 2012-12-11 MED ORDER — SODIUM CHLORIDE 0.9 % IV SOLN
25.0000 ug/h | INTRAVENOUS | Status: DC
  Administered 2012-12-11: 50 ug/h via INTRAVENOUS
  Filled 2012-12-11: qty 50

## 2012-12-11 MED ORDER — M.V.I. ADULT IV INJ
INJECTION | Freq: Once | INTRAVENOUS | Status: AC
  Administered 2012-12-11: 11:00:00 via INTRAVENOUS
  Filled 2012-12-11: qty 1000

## 2012-12-11 MED ORDER — SODIUM CHLORIDE 0.9 % IV SOLN
1.0000 mg/h | INTRAVENOUS | Status: DC
  Administered 2012-12-11: 2 mg/h via INTRAVENOUS
  Filled 2012-12-11: qty 10

## 2012-12-11 NOTE — ED Notes (Signed)
Pt arrived by [facility]. Was found at home, sitting on floor, would only reply to ems with "yes", had focal seizures and gaze to right. Was incontinent of urine and bowel pta. On arrival to ems, pt having tonic clonic seizure activity, given 2.5mg  versed on arrival to ed.bp 210/150. lsn 11pm.

## 2012-12-11 NOTE — Progress Notes (Signed)
CRITICAL VALUE ALERT  Critical value received:  Dilantin 47.1  Date of notification:  12/11/2012   Time of notification:  1214  Critical value read back:yes  Nurse who received alert:  Marcos Eke, RN  MD notified (1st page):  Dr. Marchelle Gearing  Time of first page:  1215   MD notified (2nd page): Dr. Hurman Horn  Time of second page: 1216  Responding MD:  Dr. Marchelle Gearing / Dr. Hurman Horn  Time MD responded:  1610 / 1217

## 2012-12-11 NOTE — Progress Notes (Signed)
MEDICATION RELATED CONSULT NOTE - INITIAL   Pharmacy Consult for phenytoin Indication: seizures  No Known Allergies  Patient Measurements: Height: 5\' 6"  (167.6 cm) IBW/kg (Calculated) : 63.8 Adjusted Body Weight:   Vital Signs: Temp: 100.8 F (38.2 C) (10/12 1006) BP: 125/85 mmHg (10/12 1100) Pulse Rate: 86 (10/12 1100) Intake/Output from previous day:   Intake/Output from this shift:    Labs:  Recent Labs  12/11/12 0838 12/11/12 0845  WBC 6.3  --   HGB 12.8* 15.0  HCT 36.2* 44.0  PLT 139*  --   CREATININE 0.54 0.50  ALBUMIN 4.6  --   PROT 8.5*  --   AST 56*  --   ALT 15  --   ALKPHOS 88  --   BILITOT 0.8  --    The CrCl is unknown because both a height and weight (above a minimum accepted value) are required for this calculation.   Microbiology: No results found for this or any previous visit (from the past 720 hour(s)).  Medical History: Past Medical History  Diagnosis Date  . Hypertension   . Seizures   . ETOH abuse   . Drug abuse   . PTSD (post-traumatic stress disorder)   . H/O prolonged Q-T interval on ECG   . Polysubstance abuse   . Alcohol abuse     Medications:  Med history pending  Assessment: 18 yom with a history of seizures and alcohol abuse presented to the ED with seizures. He is on chronic phenytoin but level was undetectable. Likely some compliance issues along with possible alcohol withdrawal (alcohol level undetectable). Phenytoin to resume per pharmacy.   Goal of Therapy:  Total phenytoin level 10-20  Plan:  1. Phenytoin 1000mg  IV x 1 2. Neuro ordered phenytoin 100mg  IV TID 3. Will f/u med history to determine what patient was taking at home and adjust dose if appropriate 4. Check a phenytoin level at steady state in ~5 days  Miguel Hanson, Drake Leach 12/11/2012,11:11 AM

## 2012-12-11 NOTE — ED Notes (Signed)
Neuro md at bedside to eval pt

## 2012-12-11 NOTE — Progress Notes (Signed)
eLink Physician-Brief Progress Note Patient Name: Miguel Hanson DOB: [birth year] MRN: [id number]  Date of Service  12/11/2012   HPI/Events of Note   rn placed OG tube  eICU Interventions  cxr for placement      MCQUAID, DOUGLAS 12/11/2012, 9:01 PM

## 2012-12-11 NOTE — H&P (Signed)
PULMONARY  / CRITICAL CARE MEDICINE  Name: Miguel Hanson MRN: [id number] DOB: Jun 26, 1945    ADMISSION DATE:  12/11/2012 CONSULTATION DATE:  12/11/12  REFERRING MD :  EDP PRIMARY SERVICE: PCCM   CHIEF COMPLAINT:  Seizure/Vent support   BRIEF PATIENT DESCRIPTION:  67 yo AAM with known hx of ETOH /polysubstance abuse and seizure disorder w/ med noncompliance found at home with altered mental status/seizure activity  Brought to ER via EMS with tonic/clonic seizures intubated for airway protection. PCCM to admit .   SIGNIFICANT EVENTS / STUDIES:  CT head 10/12 w/ no acute process   LINES / TUBES: 10/12 ETT>>  CULTURES: 10/12 UC >>  ANTIBIOTICS:   HISTORY OF PRESENT ILLNESS:   Miguel Hanson is a 67 y.o. male with history of seizures, alcohol abuse, polysubstance abuse brought to ER via EMS with altered mental status and seizures at home. Found this am by family , confused with non focal seizures and right eye deviation.  Last normal seen by family at 9pm on 10/11. On scene with urine and bowel incontinence. Vomited at home. On arrival to ER , tonic clonic seizure, given versed with snoring resp , intubated for airway protection. Hx of heavy etoh use, etoh level neg in ER.  He was started on Dilantin load. Neuro paged by EDP. PCCM to admit. . A CT scan of his brain was done and showed chronic microvascular ischemia, no acute intracranial abnormality. Dilantin level was low at <2.5 . B/P high on arrival to ER. Remains elevated after intubation.  Pt sedated on propofol in ER on vent , agitated and restless.  PCCM to admit to ICU .  Has several past admission for seizures ? etoh w/d seizures     PAST MEDICAL HISTORY :  Past Medical History  Diagnosis Date  . Hypertension   . Seizures   . ETOH abuse   . Drug abuse   . PTSD (post-traumatic stress disorder)   . H/O prolonged Q-T interval on ECG   . Polysubstance abuse   . Alcohol abuse    History reviewed. No pertinent past  surgical history. Prior to Admission medications   Medication Sig Start Date End Date Taking? Authorizing Provider  amLODipine (NORVASC) 5 MG tablet Take 5 mg by mouth daily.    Historical Provider, MD  ibuprofen (ADVIL,MOTRIN) 400 MG tablet Take 400 mg by mouth every 6 (six) hours as needed. For leg pain    Historical Provider, MD  lisinopril (PRINIVIL,ZESTRIL) 20 MG tablet Take 10 mg by mouth daily.  08/11/11 08/10/12  Phill Mutter Dammen, PA-C  miconazole (MICOTIN) 2 % powder Apply 1 application topically as needed. For itching    Historical Provider, MD  phenytoin (DILANTIN) 100 MG ER capsule Take 1 capsule (100 mg total) by mouth 3 (three) times daily. 05/15/12 05/15/13  Glynn Octave, MD  sertraline (ZOLOFT) 100 MG tablet Take 200 mg by mouth daily. 07/07/11   Alison Murray, MD  traZODone (DESYREL) 50 MG tablet Take 50 mg by mouth at bedtime as needed. For sleep    Historical Provider, MD   No Known Allergies  FAMILY HISTORY:  History reviewed. No pertinent family history. SOCIAL HISTORY:  reports that he has been smoking Cigarettes.  He has a 20 pack-year smoking history. He does not have any smokeless tobacco history on file. He reports that he drinks alcohol. He reports that he uses illicit drugs (Marijuana).  REVIEW OF SYSTEMS:   Sedated on vent. EDP/EMS notes reviewed and  PMH reviewed in EPIC .  No family available   SUBJECTIVE:   VITAL SIGNS: Temp:  [100.8 F (38.2 C)] 100.8 F (38.2 C) (10/12 1006) Pulse Rate:  [102-164] 102 (10/12 1000) Resp:  [15-28] 19 (10/12 1000) BP: (148-200)/(100-134) 152/100 mmHg (10/12 1000) SpO2:  [100 %] 100 % (10/12 1000) FiO2 (%):  [40 %-100 %] 40 % (10/12 0945) HEMODYNAMICS:   VENTILATOR SETTINGS: Vent Mode:  [-] PRVC FiO2 (%):  [40 %-100 %] 40 % Set Rate:  [14 bmp] 14 bmp Vt Set:  [450 mL-510 mL] 510 mL PEEP:  [5 cmH20] 5 cmH20 Plateau Pressure:  [15 cmH20] 15 cmH20 INTAKE / OUTPUT: Intake/Output   None     PHYSICAL  EXAMINATION: General:  Thin cachexic male sedated/restless on vent  Neuro:  Non focal , sedated, restless, no seizure act noted.  HEENT:  ETT  Cardiovascular:  ST , no m/r/g Lungs:  Coarse BS no wheezing  Abdomen:  Flat, soft, hypoactive BS  Musculoskeletal:  Intact  Skin:  Intact   LABS: PULMONARY  Recent Labs Lab 12/11/12 0845 12/11/12 0934  PHART  --  7.339*  PCO2ART  --  41.0  PO2ART  --  338.0*  HCO3  --  22.1  TCO2 23 23  O2SAT  --  100.0    CBC  Recent Labs Lab 12/11/12 0838 12/11/12 0845 12/11/12 1058  HGB 12.8* 15.0 12.3*  HCT 36.2* 44.0 32.2*  WBC 6.3  --  7.1  PLT 139*  --  124*    COAGULATION  Recent Labs Lab 12/11/12 0838  INR 1.23    CARDIAC   Recent Labs Lab 12/11/12 1058  TROPONINI <0.30   No results found for this basename: PROBNP,  in the last 168 hours   CHEMISTRY  Recent Labs Lab 12/11/12 0838 12/11/12 0845 12/11/12 1058  NA 140 141  --   K 4.8 4.7  --   CL 96 103  --   CO2 19  --   --   GLUCOSE 146* 148*  --   BUN 4* <3*  --   CREATININE 0.54 0.50 0.40*  CALCIUM 10.0  --   --   MG  --   --  1.1*  PHOS  --   --  2.3   Estimated Creatinine Clearance: 63.1 ml/min (by C-G formula based on Cr of 0.4).   LIVER  Recent Labs Lab 12/11/12 0838  AST 56*  ALT 15  ALKPHOS 88  BILITOT 0.8  PROT 8.5*  ALBUMIN 4.6  INR 1.23     INFECTIOUS  Recent Labs Lab 12/11/12 0914 12/11/12 1058  LATICACIDVEN 14.10*  --   PROCALCITON  --  <0.10     ENDOCRINE CBG (last 3)   Recent Labs  12/11/12 0835 12/11/12 1146  GLUCAP 186* 158*         IMAGING x48h  Ct Head Wo Contrast (only If Suspected Head Trauma And/or Pt Is On Anticoagulant)  12/11/2012   CLINICAL DATA:  Altered mental status. Possible seizure.  EXAM: CT HEAD WITHOUT CONTRAST  TECHNIQUE: Contiguous axial images were obtained from the base of the skull through the vertex without intravenous contrast.  COMPARISON:  CT of the head 05/15/2012.   FINDINGS: Mild cerebral and cerebellar atrophy. Patchy and confluent areas of decreased attenuation are noted throughout the deep and periventricular white matter of the cerebral hemispheres bilaterally, compatible with chronic microvascular ischemic disease. No acute intracranial abnormalities. Specifically, no evidence of acute intracranial hemorrhage, no  definite findings of acute/subacute cerebral ischemia, no mass, mass effect, hydrocephalus or abnormal intra or extra-axial fluid collections. Visualized paranasal sinuses and mastoids are well pneumatized, with exception of some chronic mucoperiosteal thickening in the left frontal sinus and a trace left mastoid effusion which are both unchanged. No acute displaced skull fractures are identified.  IMPRESSION: 1. No acute intracranial abnormalities. 2. The appearance of the brain is unchanged demonstrating cerebral and cerebellar atrophy and chronic microvascular ischemic changes, as above.   Electronically Signed   By: Trudie Reed [name]   On: 12/11/2012 09:20   Dg Chest Port 1 View  12/11/2012   CLINICAL DATA:  Evaluate endotracheal tube placement.  EXAM: PORTABLE CHEST - 1 VIEW  COMPARISON:  CHEST x-ray 05/15/2012.  FINDINGS: An endotracheal tube is in place with tip 3.3 cm above the carina. Lung volumes are low. No consolidative airspace disease. No pleural effusions. No evidence of pulmonary edema. No pneumothorax. Heart size is normal. Mediastinal contours are within normal limits. Atherosclerosis in the thoracic aorta.  IMPRESSION: 1. Tip of endotracheal tube there is 3.3 cm above the carinal. 2. Low lung volumes without radiographic evidence of acute cardiopulmonary disease. 3. Atherosclerosis.   Electronically Signed   By: Trudie Reed [name]   On: 12/11/2012 09:12       ASSESSMENT / PLAN:  PULMONARY A:VDRF secondary to active seizures w/ inability to protect airway Smoking hx   P:   Vent support , eval for extubation once mentation  improves  Check abg  Sedation protocol BD nebs   CARDIOVASCULAR A: Sinus Tachycardia HTN  >EKG with ST , neg troponin  P:  Home meds on hold  IV metoprolol for ST/BP control  Trend card enzymes   RENAL A:  Good renal function but severe lactic acidosis and low MAG (staf note) P:   Follow bmet  Replace electrolytes as indicated.  Cont IVF at 150cc/hw/ banana bag  Trac lacate clearance Replete MAg (staff note)  GASTROINTESTINAL A:  NPO  ETOH hx  P:   PPI /SUP  Follow LFT   HEMATOLOGIC A:  Anemia  P:  Follow cbc   INFECTIOUS A:  No apparent infection  P:   Check UA/UC   Tr wbc /temp   ENDOCRINE A:  Hyperglycemia  P:   ICU SSI   NEUROLOGIC A:  Altered Mental status /Delirium /Agitation>CT head no acute findings Tonic Clonic Seizure w/ subtherapeutic Dilantin level consistent with med noncompliance +/- etoh w/d seizures  ETOH/Polysubstance abuse   P:   Continue Dilantin  Neuro consult  Stop diprivan  Begin versed/fent drip   Global : no family present   TODAY'S SUMMARY:  67 yo with active recurrent seizures d/t etoh abuse and med non compliance intubated for airway protection , CT head w/ no acute finding.  Awaiting Neuro consult .     PARRETT,TAMMY NP-C  Pulmonary and Critical Care Medicine [facility] Pager: [phone] 12/11/2012, 10:11 AM   STAFF NOTE: I, Dr Lavinia Sharps have personally reviewed patient's available data, including medical history, events of note, physical examination and test results as part of my evaluation. I have discussed with resident/NP and other care providers such as pharmacist, RN and RRT.  In addition,  I personally evaluated patient and elicited key findings of  Acute resp failuer due to seizure due to etoh withdrawal v seziure due to non compliance. Also substance abuse - urine tox pending. Will change sedatin back o diprivan + fent instead of  fent + versed. Mag is very low'; will replete. No family at  bedside. Appreciate neuro assistance. Also, lactate high willi continue IVF and track clearance.  Rest per NP/medical resident whose note is outlined above and that I agree with  The patient is critically ill with multiple organ systems failure and requires high complexity decision making for assessment and support, frequent evaluation and titration of therapies, application of advanced monitoring technologies and extensive interpretation of multiple databases.   Critical Care Time devoted to patient care services described in this note is  35  Minutes.  Dr. Kalman Shan, [name], [facility].C.P Pulmonary and Critical Care Medicine Staff Physician [facility] [place] [facility] Pager: [phone], If no answer or between  15:00h - 7:00h: call [phone]  12/11/2012 2:05 PM

## 2012-12-11 NOTE — ED Notes (Signed)
Critical care at bedside  

## 2012-12-11 NOTE — Consult Note (Addendum)
NEURO HOSPITALIST CONSULT NOTE    Reason for Consult: recurrent seizures  HPI:                                                                                                                                          Miguel Hanson is an 67 y.o. male with a past medical history significant for HTN, polysubstance abuse, PTSD, seizures, brought to [facility] ED by medics after being found unresponsive at home. Medics reported that he was next to his bed, unresponsive, and sustained recurrent seizures both in route to the hospital and in the ED. He was very restless, no protecting his airway and was sedated with propofol and intubated. Received versed in route to the hospital. Dilantin level <2 and loaded with 1 gram IV dilantin. CT brain showed no acute abnormality. Alcohol level, electrolytes, and WBC unimpressive. Temp 100.8     Past Medical History  Diagnosis Date  . Hypertension   . Seizures   . ETOH abuse   . Drug abuse   . PTSD (post-traumatic stress disorder)   . H/O prolonged Q-T interval on ECG   . Polysubstance abuse   . Alcohol abuse     History reviewed. No pertinent past surgical history.  History reviewed. No pertinent family history.   Social History:  reports that he has been smoking Cigarettes.  He has a 20 pack-year smoking history. He does not have any smokeless tobacco history on file. He reports that he drinks alcohol. He reports that he uses illicit drugs (Marijuana).  No Known Allergies  MEDICATIONS:                                                                                                                     I have reviewed the patient's current medications.   ROS: unable to obtain due to mental status/intubation  History obtained from chart review     Physical exam: sedated and intubated. Blood  pressure 152/100, pulse 102, temperature 100.8 F (38.2 C), resp. rate 19, height 5\' 6"  (1.676 m), SpO2 100.00%.  Head: normocephalic. Neck: supple, no bruits, no JVD. Cardiac: no murmurs. Lungs: clear. Abdomen: soft, no tender, no mass. Extremities: no edema.  Neurologic Examination:                                                                                                      Mental status: sedated with propofol, intubated. CN 2-12: pupils 2-3 mm, reactive. No gaze preference. EOM full without nystagmus. Face seems to be symmetric. Tongue: intubated. Motor: on propofol, but seems to have symmetric movements upon painful stimuli. Sensory: reacts to pain. DTRs: 2+ all over. Coordination and gait: unable to test. No meningeal signs.    Lab Results  Component Value Date/Time   CHOL  Value: 130        ATP III CLASSIFICATION:  <200     mg/dL   Desirable  564-332  mg/dL   Borderline High  >=951    mg/dL   High        8/84/1660  6:48 PM    Results for orders placed during the hospital encounter of 12/11/12 (from the past 48 hour(s))  PHENYTOIN LEVEL, TOTAL     Status: Abnormal   Collection Time    12/11/12  8:34 AM      Result Value Range   Phenytoin Lvl <2.5 (*) 10.0 - 20.0 ug/mL  GLUCOSE, CAPILLARY     Status: Abnormal   Collection Time    12/11/12  8:35 AM      Result Value Range   Glucose-Capillary 186 (*) 70 - 99 mg/dL  CBC     Status: Abnormal   Collection Time    12/11/12  8:38 AM      Result Value Range   WBC 6.3  4.0 - 10.5 K/uL   RBC 3.32 (*) 4.22 - 5.81 MIL/uL   Hemoglobin 12.8 (*) 13.0 - 17.0 g/dL   HCT 63.0 (*) 16.0 - 10.9 %   MCV 109.0 (*) 78.0 - 100.0 fL   MCH 41.6 (*) 26.0 - 34.0 pg   MCHC 37.2 (*) 30.0 - 36.0 g/dL   Comment: TARGET CELLS   RDW 16.1 (*) 11.5 - 15.5 %   Platelets 139 (*) 150 - 400 K/uL  COMPREHENSIVE METABOLIC PANEL     Status: Abnormal   Collection Time    12/11/12  8:38 AM      Result Value Range   Sodium 140  135 - 145 mEq/L    Potassium 4.8  3.5 - 5.1 mEq/L   Chloride 96  96 - 112 mEq/L   CO2 19  19 - 32 mEq/L   Glucose, Bld 146 (*) 70 - 99 mg/dL   BUN 4 (*) 6 - 23 mg/dL   Creatinine, Ser 3.23  0.50 - 1.35 mg/dL   Calcium 55.7  8.4 - 32.2 mg/dL   Total Protein 8.5 (*) 6.0 - 8.3  g/dL   Albumin 4.6  3.5 - 5.2 g/dL   AST 56 (*) 0 - 37 U/L   ALT 15  0 - 53 U/L   Alkaline Phosphatase 88  39 - 117 U/L   Total Bilirubin 0.8  0.3 - 1.2 mg/dL   GFR calc non Af Amer >90  >90 mL/min   GFR calc Af Amer >90  >90 mL/min   Comment: (NOTE)     The eGFR has been calculated using the CKD EPI equation.     This calculation has not been validated in all clinical situations.     eGFR's persistently <90 mL/min signify possible Chronic Kidney     Disease.  PROTIME-INR     Status: None   Collection Time    12/11/12  8:38 AM      Result Value Range   Prothrombin Time 15.2  11.6 - 15.2 seconds   INR 1.23  0.00 - 1.49  ETHANOL     Status: None   Collection Time    12/11/12  8:38 AM      Result Value Range   Alcohol, Ethyl (B) <11  0 - 11 mg/dL   Comment:            LOWEST DETECTABLE LIMIT FOR     SERUM ALCOHOL IS 11 mg/dL     FOR MEDICAL PURPOSES ONLY  POCT I-STAT TROPONIN I     Status: None   Collection Time    12/11/12  8:43 AM      Result Value Range   Troponin i, poc 0.00  0.00 - 0.08 ng/mL   Comment 3            Comment: Due to the release kinetics of cTnI,     a negative result within the first hours     of the onset of symptoms does not rule out     myocardial infarction with certainty.     If myocardial infarction is still suspected,     repeat the test at appropriate intervals.  POCT I-STAT, CHEM 8     Status: Abnormal   Collection Time    12/11/12  8:45 AM      Result Value Range   Sodium 141  135 - 145 mEq/L   Potassium 4.7  3.5 - 5.1 mEq/L   Chloride 103  96 - 112 mEq/L   BUN <3 (*) 6 - 23 mg/dL   Creatinine, Ser 1.61  0.50 - 1.35 mg/dL   Glucose, Bld 096 (*) 70 - 99 mg/dL   Calcium, Ion 0.45   4.09 - 1.30 mmol/L   TCO2 23  0 - 100 mmol/L   Hemoglobin 15.0  13.0 - 17.0 g/dL   HCT 81.1  91.4 - 78.2 %  CG4 I-STAT (LACTIC ACID)     Status: Abnormal   Collection Time    12/11/12  9:14 AM      Result Value Range   Lactic Acid, Venous 14.10 (*) 0.5 - 2.2 mmol/L  POCT I-STAT 3, BLOOD GAS (G3+)     Status: Abnormal   Collection Time    12/11/12  9:34 AM      Result Value Range   pH, Arterial 7.339 (*) 7.350 - 7.450   pCO2 arterial 41.0  35.0 - 45.0 mmHg   pO2, Arterial 338.0 (*) 80.0 - 100.0 mmHg   Bicarbonate 22.1  20.0 - 24.0 mEq/L   TCO2 23  0 - 100 mmol/L   O2 Saturation  100.0     Acid-base deficit 4.0 (*) 0.0 - 2.0 mmol/L   Patient temperature 98.6 F     Collection site RADIAL, ALLEN'S TEST ACCEPTABLE     Drawn by Operator     Sample type ARTERIAL      Ct Head Wo Contrast (only If Suspected Head Trauma And/or Pt Is On Anticoagulant)  12/11/2012   CLINICAL DATA:  Altered mental status. Possible seizure.  EXAM: CT HEAD WITHOUT CONTRAST  TECHNIQUE: Contiguous axial images were obtained from the base of the skull through the vertex without intravenous contrast.  COMPARISON:  CT of the head 05/15/2012.  FINDINGS: Mild cerebral and cerebellar atrophy. Patchy and confluent areas of decreased attenuation are noted throughout the deep and periventricular white matter of the cerebral hemispheres bilaterally, compatible with chronic microvascular ischemic disease. No acute intracranial abnormalities. Specifically, no evidence of acute intracranial hemorrhage, no definite findings of acute/subacute cerebral ischemia, no mass, mass effect, hydrocephalus or abnormal intra or extra-axial fluid collections. Visualized paranasal sinuses and mastoids are well pneumatized, with exception of some chronic mucoperiosteal thickening in the left frontal sinus and a trace left mastoid effusion which are both unchanged. No acute displaced skull fractures are identified.  IMPRESSION: 1. No acute intracranial  abnormalities. 2. The appearance of the brain is unchanged demonstrating cerebral and cerebellar atrophy and chronic microvascular ischemic changes, as above.   Electronically Signed   By: Trudie Reed [name]   On: 12/11/2012 09:20   Dg Chest Port 1 View  12/11/2012   CLINICAL DATA:  Evaluate endotracheal tube placement.  EXAM: PORTABLE CHEST - 1 VIEW  COMPARISON:  CHEST x-ray 05/15/2012.  FINDINGS: An endotracheal tube is in place with tip 3.3 cm above the carina. Lung volumes are low. No consolidative airspace disease. No pleural effusions. No evidence of pulmonary edema. No pneumothorax. Heart size is normal. Mediastinal contours are within normal limits. Atherosclerosis in the thoracic aorta.  IMPRESSION: 1. Tip of endotracheal tube there is 3.3 cm above the carinal. 2. Low lung volumes without radiographic evidence of acute cardiopulmonary disease. 3. Atherosclerosis.   Electronically Signed   By: Trudie Reed [name]   On: 12/11/2012 09:12     Assessment/Plan: GTC SE in a 67 y/o with well known history of seizure disorder and polysubstance abuse and sub-therapeutic dilantin level. CT brain unremarkable. Slightly elevated temp most likely resulting from SE. Already loaded with dilantin 1 gram IV and plan from switching from propofol to versed as per ICU team. Will get dilantin level in 2 hours. Daily dilantin 300 mg IV. EEG ( may need continuous EEG to exclude NCSE). Will follow up.   Wyatt Portela, MD [facility] [phone]  12/11/2012, 10:14 AM   Addendum: called by ICU nurse to inform me of toxic dilantin level 47. Stop dilantin and check dilantin level daily. Will start keppra 500 mg BID starting tomorrow.

## 2012-12-11 NOTE — ED Notes (Signed)
Patient transported to CT with RN present. 

## 2012-12-11 NOTE — ED Provider Notes (Signed)
CSN: [id number]     Arrival date & time 12/11/12  8413 History   First MD Initiated Contact with Patient 12/11/12 0845     Chief Complaint  Patient presents with  . Seizures   (Consider location/radiation/quality/duration/timing/severity/associated sxs/prior Treatment) HPI Comments: Level 5 caveat due to altered mentation, active seizure.  EMS called due to being found on ground, urinary incontinence, vomited at scene.  Pt received IV and versed 2.5 mg en route, initially C ccollar placed due to unknown if trauma.  Found up against wall.  No reported obvious trauma on pt per EMS.  Pt known to drink alcohol heavily, has had seizures in the past.  Pt had eye deviation to right per EMS.    Patient is a 67 y.o. male presenting with seizures. The history is provided by the EMS personnel.  Seizures Seizure activity on arrival: yes   Seizure type:  Tonic, grand mal and partial complex Initial focality:  Right-sided Episode characteristics: eye deviation and unresponsiveness   Return to baseline: no   Severity:  Severe History of seizures: yes   Current therapy:  Phenytoin Compliance with current therapy:  Unable to specify   Past Medical History  Diagnosis Date  . Hypertension   . Seizures   . ETOH abuse   . Drug abuse   . PTSD (post-traumatic stress disorder)   . H/O prolonged Q-T interval on ECG   . Polysubstance abuse   . Alcohol abuse    History reviewed. No pertinent past surgical history. History reviewed. No pertinent family history. History  Substance Use Topics  . Smoking status: Current Some Day Smoker -- 0.50 packs/day for 40 years    Types: Cigarettes  . Smokeless tobacco: Not on file  . Alcohol Use: Yes     Comment: abuse/binge    Review of Systems  Neurological: Positive for seizures.    Allergies  Review of patient's allergies indicates no known allergies.  Home Medications   Current Outpatient Rx  Name  Route  Sig  Dispense  Refill  . amLODipine  (NORVASC) 5 MG tablet   Oral   Take 5 mg by mouth daily.         Marland Kitchen ibuprofen (ADVIL,MOTRIN) 400 MG tablet   Oral   Take 400 mg by mouth every 6 (six) hours as needed. For leg pain         . EXPIRED: lisinopril (PRINIVIL,ZESTRIL) 20 MG tablet   Oral   Take 10 mg by mouth daily.          . miconazole (MICOTIN) 2 % powder   Topical   Apply 1 application topically as needed. For itching         . phenytoin (DILANTIN) 100 MG ER capsule   Oral   Take 1 capsule (100 mg total) by mouth 3 (three) times daily.   60 capsule   0   . sertraline (ZOLOFT) 100 MG tablet   Oral   Take 200 mg by mouth daily.         . traZODone (DESYREL) 50 MG tablet   Oral   Take 50 mg by mouth at bedtime as needed. For sleep          BP 148/105  Pulse 137  Resp 15  Ht 5\' 6"  (1.676 m)  SpO2 100% Physical Exam  Nursing note and vitals reviewed. Constitutional: He appears well-developed. He appears distressed.  HENT:  Head: Normocephalic and atraumatic.  Eyes: Right eye exhibits no discharge.  Left eye exhibits no discharge. No scleral icterus.  Eye deviation to left, no reflex, pupils sluggishly reactive  Cardiovascular: Intact distal pulses.  An irregular rhythm present. Tachycardia present.   Pulmonary/Chest: Accessory muscle usage present. No stridor. Tachypnea noted. He has no decreased breath sounds. He has no wheezes. He has rhonchi. He has rales.  Abdominal: Soft. He exhibits no distension. There is no tenderness.  Neurological: He is unresponsive. He exhibits abnormal muscle tone. He displays seizure activity.  Unresponsive, initially eyes deviated to left, right arm and leg, tonic, left arm and leg tonic, but then was more pliable while right remained tonic.  Gradually, eyes straightened, pt still combative, more purposeful with left arm, but not following commands.  Not using right leg persisted  Skin: Skin is warm. No rash noted. He is diaphoretic. No pallor.  Psychiatric:   unable    ED Course  INTUBATION Date/Time: 12/11/2012 8:57 AM Performed by: Lear Ng. Authorized by: Lear Ng Consent: The procedure was performed in an emergent situation. Patient identity confirmed: arm band Time out: Immediately prior to procedure a "time out" was called to verify the correct patient, procedure, equipment, support staff and site/side marked as required. Indications: airway protection Intubation method: fiberoptic oral Patient status: paralyzed (RSI) Preoxygenation: ILMA/LMA, BVM and nonrebreather mask Pretreatment medications: midazolam (ativan) Sedatives: etomidate Paralytic: succinylcholine Tube size: 7.5 mm Tube type: cuffed Number of attempts: 1 Cricoid pressure: no Cords visualized: yes Post-procedure assessment: chest rise,  ETCO2 monitor and CO2 detector Breath sounds: equal Cuff inflated: yes ETT to lip: 24 cm Tube secured with: ETT holder Chest x-ray interpreted by radiologist. Patient tolerance: Patient tolerated the procedure well with no immediate complications.   (including critical care time)   CRITICAL CARE Performed by: Lear Ng. Total critical care time: 40 min Critical care time was exclusive of separately billable procedures and treating other patients. Critical care was necessary to treat or prevent imminent or life-threatening deterioration. Critical care was time spent personally by me on the following activities: development of treatment plan with patient and/or surrogate as well as nursing, discussions with consultants, evaluation of patient's response to treatment, examination of patient, obtaining history from patient or surrogate, ordering and performing treatments and interventions, ordering and review of laboratory studies, ordering and review of radiographic studies, pulse oximetry and re-evaluation of patient's condition.    Labs Review Labs Reviewed  GLUCOSE, CAPILLARY - Abnormal; Notable for the  following:    Glucose-Capillary 186 (*)    All other components within normal limits  CBC - Abnormal; Notable for the following:    RBC 3.32 (*)    Hemoglobin 12.8 (*)    HCT 36.2 (*)    MCV 109.0 (*)    MCH 41.6 (*)    MCHC 37.2 (*)    RDW 16.1 (*)    Platelets 139 (*)    All other components within normal limits  COMPREHENSIVE METABOLIC PANEL - Abnormal; Notable for the following:    Glucose, Bld 146 (*)    BUN 4 (*)    Total Protein 8.5 (*)    AST 56 (*)    All other components within normal limits  POCT I-STAT, CHEM 8 - Abnormal; Notable for the following:    BUN <3 (*)    Glucose, Bld 148 (*)    All other components within normal limits  CG4 I-STAT (LACTIC ACID) - Abnormal; Notable for the following:    Lactic Acid, Venous 14.10 (*)  All other components within normal limits  PROTIME-INR  ETHANOL  URINALYSIS, ROUTINE W REFLEX MICROSCOPIC  PHENYTOIN LEVEL, TOTAL  POCT I-STAT TROPONIN I   Imaging Review Ct Head Wo Contrast (only If Suspected Head Trauma And/or Pt Is On Anticoagulant)  12/11/2012   CLINICAL DATA:  Altered mental status. Possible seizure.  EXAM: CT HEAD WITHOUT CONTRAST  TECHNIQUE: Contiguous axial images were obtained from the base of the skull through the vertex without intravenous contrast.  COMPARISON:  CT of the head 05/15/2012.  FINDINGS: Mild cerebral and cerebellar atrophy. Patchy and confluent areas of decreased attenuation are noted throughout the deep and periventricular white matter of the cerebral hemispheres bilaterally, compatible with chronic microvascular ischemic disease. No acute intracranial abnormalities. Specifically, no evidence of acute intracranial hemorrhage, no definite findings of acute/subacute cerebral ischemia, no mass, mass effect, hydrocephalus or abnormal intra or extra-axial fluid collections. Visualized paranasal sinuses and mastoids are well pneumatized, with exception of some chronic mucoperiosteal thickening in the left  frontal sinus and a trace left mastoid effusion which are both unchanged. No acute displaced skull fractures are identified.  IMPRESSION: 1. No acute intracranial abnormalities. 2. The appearance of the brain is unchanged demonstrating cerebral and cerebellar atrophy and chronic microvascular ischemic changes, as above.   Electronically Signed   By: Trudie Reed [name]   On: 12/11/2012 09:20   Dg Chest Port 1 View  12/11/2012   CLINICAL DATA:  Evaluate endotracheal tube placement.  EXAM: PORTABLE CHEST - 1 VIEW  COMPARISON:  CHEST x-ray 05/15/2012.  FINDINGS: An endotracheal tube is in place with tip 3.3 cm above the carina. Lung volumes are low. No consolidative airspace disease. No pleural effusions. No evidence of pulmonary edema. No pneumothorax. Heart size is normal. Mediastinal contours are within normal limits. Atherosclerosis in the thoracic aorta.  IMPRESSION: 1. Tip of endotracheal tube there is 3.3 cm above the carinal. 2. Low lung volumes without radiographic evidence of acute cardiopulmonary disease. 3. Atherosclerosis.   Electronically Signed   By: Trudie Reed [name]   On: 12/11/2012 09:12    EKG Interpretation       EKG at time 08:29, shows sinus tachycardia at rate 146 with sinus arrhythmia. Borderline right axis deviation. QT prolongation. Nonspecific ST abnormalities, minimally, likely rate related. EKG is changed simply and rate and nonspecific changes compared to that from 05/15/2012.   9:31 AM Portable chest x-ray myself and reviewed radiologist interpretation. No obvious infiltrates tube appears to be in good position. Gross look at noncontrast head CT by me shows no active hemorrhage, midline shift or masses. Patient's alcohol level is less than 11. This suggests the patient had alcohol withdrawal seizures, propagated by likely noncompliance with Dilantin use. However after repeated seizures with no return to baseline, the patient was technically in status epilepticus.  Stroke is still a possibility, however last seen normal time was yesterday evening. I spoke to intensivist who will see the patient in the ED and likely admit to ICU. Plan is to also consult the neural hospitalist to evaluate the patient.   MDM   1. Status epilepticus   2. History of alcohol abuse   3. History of seizure   4. Hypertension   5. Respiratory failure     Pt is unresponsive, seizing despite versed given.  Pt is very HTN, had vomited at scene, concern for aspiration and continued airway protection.  Pt remained combative, not protecting airway well and also in need of emergent testing.  Last seen  normal last night, outside of stroke window thus not called, not TPA candidate.  Will need head CT after airway controlled by intubation.  Will need ICU admit and neuro consultation.  Dilantin ordered both level and meds per pharmacy.       Gavin Pound. Oletta Lamas, MD 12/11/12 [phone]

## 2012-12-12 ENCOUNTER — Inpatient Hospital Stay (HOSPITAL_COMMUNITY): Payer: Medicare Other

## 2012-12-12 DIAGNOSIS — R4182 Altered mental status, unspecified: Secondary | ICD-10-CM

## 2012-12-12 DIAGNOSIS — G40901 Epilepsy, unspecified, not intractable, with status epilepticus: Secondary | ICD-10-CM | POA: Diagnosis present

## 2012-12-12 LAB — BASIC METABOLIC PANEL
BUN: 3 mg/dL — ABNORMAL LOW (ref 6–23)
CO2: 22 mEq/L (ref 19–32)
Calcium: 7.5 mg/dL — ABNORMAL LOW (ref 8.4–10.5)
Chloride: 101 mEq/L (ref 96–112)
Creatinine, Ser: 0.5 mg/dL (ref 0.50–1.35)
GFR calc Af Amer: >90 mL/min (ref >90)
GFR calc non Af Amer: >90 mL/min (ref >90)
Glucose, Bld: 93 mg/dL (ref 70–99)
Potassium: 3.1 mEq/L — ABNORMAL LOW (ref 3.5–5.1)
Sodium: 132 mEq/L — ABNORMAL LOW (ref 135–145)

## 2012-12-12 LAB — BLOOD GAS, ARTERIAL
Bicarbonate: 22.8 mEq/L (ref 20.0–24.0)
Patient temperature: 98.6
TCO2: 23.8 mmol/L (ref 0–100)
pCO2 arterial: 32.9 mmHg — ABNORMAL LOW (ref 35.0–45.0)
pH, Arterial: 7.455 — ABNORMAL HIGH (ref 7.350–7.450)
pO2, Arterial: 124 mmHg — ABNORMAL HIGH (ref 80.0–100.0)

## 2012-12-12 LAB — LACTIC ACID, PLASMA: Lactic Acid, Venous: 1.5 mmol/L (ref 0.5–2.2)

## 2012-12-12 LAB — CBC
HCT: 27.6 % — ABNORMAL LOW (ref 39.0–52.0)
MCH: 40.6 pg — ABNORMAL HIGH (ref 26.0–34.0)
MCHC: 37.3 g/dL — ABNORMAL HIGH (ref 30.0–36.0)
RBC: 2.54 MIL/uL — ABNORMAL LOW (ref 4.22–5.81)
RDW: 16.3 % — ABNORMAL HIGH (ref 11.5–15.5)

## 2012-12-12 LAB — GLUCOSE, CAPILLARY
Glucose-Capillary: 102 mg/dL — ABNORMAL HIGH (ref 70–99)
Glucose-Capillary: 83 mg/dL (ref 70–99)

## 2012-12-12 LAB — PHENYTOIN LEVEL, TOTAL: Phenytoin Lvl: 22.9 ug/mL — ABNORMAL HIGH (ref 10.0–20.0)

## 2012-12-12 LAB — CK TOTAL AND CKMB (NOT AT ARMC): Relative Index: 1.6 (ref 0.0–2.5)

## 2012-12-12 LAB — URINE CULTURE: Colony Count: NO GROWTH

## 2012-12-12 LAB — TROPONIN I: Troponin I: <0.3 ng/mL (ref <0.30)

## 2012-12-12 LAB — PHOSPHORUS: Phosphorus: 2.3 mg/dL (ref 2.3–4.6)

## 2012-12-12 LAB — MAGNESIUM: Magnesium: 1.9 mg/dL (ref 1.5–2.5)

## 2012-12-12 MED ORDER — PHENYTOIN SODIUM EXTENDED 100 MG PO CAPS
300.0000 mg | ORAL_CAPSULE | Freq: Every day | ORAL | Status: DC
  Filled 2012-12-12: qty 3

## 2012-12-12 MED ORDER — FENTANYL CITRATE 0.05 MG/ML IJ SOLN: 12.5000 ug | INTRAMUSCULAR | Status: DC | PRN

## 2012-12-12 MED ORDER — PHENYTOIN SODIUM EXTENDED 100 MG PO CAPS
200.0000 mg | ORAL_CAPSULE | Freq: Every day | ORAL | Status: AC
  Administered 2012-12-12: 200 mg via ORAL
  Filled 2012-12-12: qty 2

## 2012-12-12 MED ORDER — KCL IN DEXTROSE-NACL 40-5-0.9 MEQ/L-%-% IV SOLN
INTRAVENOUS | Status: DC
  Administered 2012-12-12: 100 mL/h via INTRAVENOUS
  Administered 2012-12-13: 10:00:00 via INTRAVENOUS
  Filled 2012-12-12 (×5): qty 1000

## 2012-12-12 MED ORDER — ENSURE COMPLETE PO LIQD
237.0000 mL | Freq: Two times a day (BID) | ORAL | Status: DC
  Administered 2012-12-12 – 2012-12-13 (×2): 237 mL via ORAL

## 2012-12-12 NOTE — Progress Notes (Signed)
Pt extubated 0900. Wasted fentanyl 50cc from 250cc bag and wasted 25cc propofol from 100cc bottle in sink. Witnessed by Max Sane RN

## 2012-12-12 NOTE — Progress Notes (Signed)
EEG completed; results pending.    

## 2012-12-12 NOTE — Progress Notes (Signed)
CRITICAL VALUE ALERT  Critical value received:  CKMB 6.2  Date of notification:  12/12/12  Time of notification:  0640  Critical value read back: Yes  Nurse who received alert:  Lily Peer  MD notified (1st page):  Molli Knock  Time of first page:  06:41

## 2012-12-12 NOTE — Procedures (Signed)
Extubation Procedure Note  Patient Details:   Name: Miguel Hanson DOB: [birth year] MRN: [id number]   Airway Documentation:     Evaluation  O2 sats: stable throughout Complications: No apparent complications Patient did tolerate procedure well. Bilateral Breath Sounds: Rhonchi Suctioning: Airway Yes  [name] 12/12/2012, 9:08 AM Extubated patient per Dr. Jarrett Ables, patient has strong cough and is speaking, no stridor noted, rt will continue to monitor

## 2012-12-12 NOTE — Progress Notes (Addendum)
NEURO HOSPITALIST PROGRESS NOTE   SUBJECTIVE:                                                                                                                        Patient is alert and oriented, watching TV in room, no further seizures. He admits to not taking his medication and drinking (ETOH) instead of taking his medication.    OBJECTIVE:                                                                                                                           Vital signs in last 24 hours: Temp:  [98 F (36.7 C)-98.6 F (37 C)] 98.6 F (37 C) (10/13 0700) Pulse Rate:  [78-111] 88 (10/13 1130) Resp:  [10-27] 24 (10/13 1130) BP: (85-136)/(60-91) 113/77 mmHg (10/13 1100) SpO2:  [97 %-100 %] 99 % (10/13 1130) FiO2 (%):  [30 %-40 %] 40 % (10/13 0845) Weight:  [50.5 kg (111 lb 5.3 oz)-53 kg (116 lb 13.5 oz)] 53 kg (116 lb 13.5 oz) (10/13 0359)  Intake/Output from previous day: 10/12 0701 - 10/13 0700 In: 3337 [I.V.:2967; IV Piggyback:370] Out: 1243 [Urine:1243] Intake/Output this shift: Total I/O In: 362.6 [I.V.:362.6] Out: 250 [Urine:250] Nutritional status: NPO  Past Medical History  Diagnosis Date  . Hypertension   . Seizures   . ETOH abuse   . Drug abuse   . PTSD (post-traumatic stress disorder)   . H/O prolonged Q-T interval on ECG   . Polysubstance abuse   . Alcohol abuse     Neurologic Exam:  Mental Status: Alert, oriented, thought content appropriate.  Speech fluent without evidence of aphasia.  Able to follow 3 step commands without difficulty. Cranial Nerves: II: Discs flat bilaterally; Visual fields grossly normal, pupils equal, round, reactive to light and accommodation III,IV, VI: ptosis not present, extra-ocular motions intact bilaterally V,VII: smile symmetric, facial light touch sensation normal bilaterally VIII: hearing normal bilaterally IX,X: gag reflex present XI: bilateral shoulder shrug XII: midline tongue  extension Motor: Right : Upper extremity   5/5    Left:     Upper extremity   5/5  Lower extremity   5/5     Lower extremity   5/5 Tone and bulk:normal  tone throughout; no atrophy noted Sensory: Pinprick and light touch intact throughout, bilaterally Deep Tendon Reflexes:  Right: Upper Extremity   Left: Upper extremity   biceps (C-5 to C-6) 2/4   biceps (C-5 to C-6) 2/4 tricep (C7) 2/4    triceps (C7) 2/4 Brachioradialis (C6) 2/4  Brachioradialis (C6) 2/4  Lower Extremity Lower Extremity  quadriceps (L-2 to L-4) 2/4   quadriceps (L-2 to L-4) 2/4 Achilles (S1) 2/4   Achilles (S1) 2/4  Plantars: Right: downgoing   Left: downgoing Cerebellar: normal finger-to-nose,  normal heel-to-shin test CV: pulses palpable throughout    Lab Results: Lab Results  Component Value Date/Time   CHOL  Value: 130        ATP III CLASSIFICATION:  <200     mg/dL   Desirable  161-096  mg/dL   Borderline High  >=045    mg/dL   High        06/08/8117  6:48 PM   Lipid Panel No results found for this basename: CHOL, TRIG, HDL, CHOLHDL, VLDL, LDLCALC,  in the last 72 hours  Studies/Results: Ct Head Wo Contrast (only If Suspected Head Trauma And/or Pt Is On Anticoagulant)  12/11/2012   CLINICAL DATA:  Altered mental status. Possible seizure.  EXAM: CT HEAD WITHOUT CONTRAST  TECHNIQUE: Contiguous axial images were obtained from the base of the skull through the vertex without intravenous contrast.  COMPARISON:  CT of the head 05/15/2012.  FINDINGS: Mild cerebral and cerebellar atrophy. Patchy and confluent areas of decreased attenuation are noted throughout the deep and periventricular white matter of the cerebral hemispheres bilaterally, compatible with chronic microvascular ischemic disease. No acute intracranial abnormalities. Specifically, no evidence of acute intracranial hemorrhage, no definite findings of acute/subacute cerebral ischemia, no mass, mass effect, hydrocephalus or abnormal intra or extra-axial  fluid collections. Visualized paranasal sinuses and mastoids are well pneumatized, with exception of some chronic mucoperiosteal thickening in the left frontal sinus and a trace left mastoid effusion which are both unchanged. No acute displaced skull fractures are identified.  IMPRESSION: 1. No acute intracranial abnormalities. 2. The appearance of the brain is unchanged demonstrating cerebral and cerebellar atrophy and chronic microvascular ischemic changes, as above.   Electronically Signed   By: Trudie Reed [name]   On: 12/11/2012 09:20   Dg Chest Port 1 View  12/12/2012   *RADIOLOGY REPORT*  Clinical Data: Intubation  PORTABLE CHEST - 1 VIEW  Comparison: Chest radiograph December 11, 2012  Findings: Cardiac silhouette is unremarkable. Moderately calcified aortic knob.  Mild chronic interstitial changes without pleural effusions or focal consolidations.  Minimal central pulmonary vasculature congestion.  Endotracheal tube tip projects 2.7 cm above the carina. Nasogastric tube looped in the proximal stomach region. Multiple EKG lines overlay the patient and could obscure underlying subtle pathology.  IMPRESSION: Nasogastric tube looped in region of stomach, no apparent change in the endotracheal tube.  Minimal central pulmonary vasculature congestion, similar.   Original Report Authenticated By: Awilda Metro   Dg Chest Port 1 View  12/11/2012   CLINICAL DATA:  Evaluate endotracheal tube placement.  EXAM: PORTABLE CHEST - 1 VIEW  COMPARISON:  CHEST x-ray 05/15/2012.  FINDINGS: An endotracheal tube is in place with tip 3.3 cm above the carina. Lung volumes are low. No consolidative airspace disease. No pleural effusions. No evidence of pulmonary edema. No pneumothorax. Heart size is normal. Mediastinal contours are within normal limits. Atherosclerosis in the thoracic aorta.  IMPRESSION: 1. Tip of endotracheal tube there is 3.3  cm above the carinal. 2. Low lung volumes without radiographic evidence of  acute cardiopulmonary disease. 3. Atherosclerosis.   Electronically Signed   By: Trudie Reed [name]   On: 12/11/2012 09:12   Dg Abd Portable 1v  12/11/2012   CLINICAL DATA:  Oral gastric tube placement.  EXAM: PORTABLE ABDOMEN - 1 VIEW  COMPARISON:  None.  FINDINGS: Distal tip of feeding tube is seen in proximal stomach. No abnormal bowel gas pattern is noted.  IMPRESSION: Feeding tube tip seen in proximal stomach.   Electronically Signed   By: Roque Lias [name]   On: 12/11/2012 21:36    MEDICATIONS                                                                                                                        Scheduled: . antiseptic oral rinse  15 mL Mouth Rinse QID  . chlorhexidine  15 mL Mouth Rinse BID  . phenytoin (DILANTIN) IV  100 mg Intravenous Q8H    ASSESSMENT/PLAN:                                                                                                             Seizure in setting of medication non-complince and Dilantin level <2.5. Current Dilantin level is 22.9 and patient is seizure free. EEG pending.   Recommend: 1) May switch to Dilantin PO 300 mg HS when taking PO medications 2) If EEG is negative-no further recommendations per neurology.   Assessment and plan discussed with with attending physician and they are in agreement.    Felicie Morn PA-C [facility] [phone]  12/12/2012, 11:36 AM   Addendum: EEG is normal. SE resolved. No further intervention form a neuro standpoint, he can go home on his usual anti-seizure medication. Will sign off. Please, call neurology with questions or concerns.  Miguel Portela, MD [facility]

## 2012-12-12 NOTE — Procedures (Signed)
EEG report.  Brief clinical history: 67 years old male admitted to [facility] with GTC SE that is now resolved. Patient has a history of epileptic seizures.  Technique: this is a 17 channel routine scalp EEG performed at the bedside with bipolar and monopolar montages arranged in accordance to the international 10/20 system of electrode placement. One channel was dedicated to EKG recording.  The study was performed during wakefulness and stage 2 sleep. No activating procedures performed.  Description:In the wakeful state, the best background consisted of a medium amplitude, posterior dominant, well sustained, symmetric and reactive 9 Hz rhythm. Drowsiness demonstrated dropout of the alpha rhythm. Stage 2 sleep showed symmetric and synchronous sleep spindles without intermixed epileptiform discharges. No focal or generalized epileptiform discharges noted.  No slowing seen.  EKG showed sinus rhythm.  Impression: this is a normal awake and asleep EEG. Please, be aware that a normal EEG does not exclude the possibility of epilepsy.  Clinical correlation is advised.  Wyatt Portela, MD

## 2012-12-12 NOTE — Progress Notes (Signed)
Utilization  Review completed.    Dominik Yordy,RN,BSN   Care Management.    

## 2012-12-12 NOTE — Progress Notes (Signed)
PULMONARY  / CRITICAL CARE MEDICINE  Name: Miguel Hanson MRN: [id number] DOB: 1945/04/11    ADMISSION DATE:  12/11/2012 CONSULTATION DATE:  12/11/12  REFERRING MD :  EDP PRIMARY SERVICE: PCCM   CHIEF COMPLAINT:  Seizure/Vent support   BRIEF PATIENT DESCRIPTION:  73 M with hx of ETOH /polysubstance abuse, seizure disorder, noncompliance found at home with altered mental status/seizure activity. Brought to ER via EMS with tonic/clonic seizures intubated for airway protection.    SIGNIFICANT EVENTS / STUDIES:  10/12 CT head: NAD  LINES / TUBES: 10/12 ETT >> 10/13  CULTURES: 10/12 UC >>   ANTIBIOTICS:   SUBJECTIVE:  Passed SBT. Extubated. Looks good post extubation. RASS 0. + F/C  VITAL SIGNS: Temp:  [98 F (36.7 C)-99.4 F (37.4 C)] 99.4 F (37.4 C) (10/13 1539) Pulse Rate:  [78-126] 103 (10/13 1400) Resp:  [10-27] 17 (10/13 1400) BP: (85-145)/(60-83) 114/77 mmHg (10/13 1400) SpO2:  [94 %-100 %] 94 % (10/13 1400) FiO2 (%):  [30 %-40 %] 40 % (10/13 0845) Weight:  [53 kg (116 lb 13.5 oz)] 53 kg (116 lb 13.5 oz) (10/13 0359) HEMODYNAMICS:   VENTILATOR SETTINGS: Vent Mode:  [-] CPAP;PSV FiO2 (%):  [30 %-40 %] 40 % Set Rate:  [14 bmp] 14 bmp Vt Set:  [510 mL-550 mL] 550 mL PEEP:  [5 cmH20] 5 cmH20 Pressure Support:  [5 cmH20] 5 cmH20 Plateau Pressure:  [16 cmH20-17 cmH20] 16 cmH20 INTAKE / OUTPUT: Intake/Output     10/12 0701 - 10/13 0700 10/13 0701 - 10/14 0700   P.O.  240   I.V. (mL/kg) 2967 (56) 662.6 (12.5)   IV Piggyback 370    Total Intake(mL/kg) 3337 (63) 902.6 (17)   Urine (mL/kg/hr) 1243 450 (1)   Total Output 1243 450   Net +2094 +452.6          PHYSICAL EXAMINATION: General:  NAD Neuro:  No focal deficits HEENT: NCAT, WNL  Cardiovascular:  RRR Lungs: few [place] rhonchi Abdomen: soft, NT, +BS Ext: war, no edema   LABS: PULMONARY  Recent Labs Lab 12/11/12 0845 12/11/12 0934 12/12/12 0358  PHART  --  7.339* 7.455*  PCO2ART  --  41.0 32.9*   PO2ART  --  338.0* 124.0*  HCO3  --  22.1 22.8  TCO2 23 23 23.8  O2SAT  --  100.0 99.1    CBC  Recent Labs Lab 12/11/12 0838 12/11/12 0845 12/11/12 1058 12/12/12 0530  HGB 12.8* 15.0 12.3* 10.3*  HCT 36.2* 44.0 32.2* 27.6*  WBC 6.3  --  7.1 8.6  PLT 139*  --  124* 104*    COAGULATION  Recent Labs Lab 12/11/12 0838  INR 1.23    CARDIAC    Recent Labs Lab 12/11/12 1058 12/11/12 1532 12/11/12 2215 12/12/12 0530  TROPONINI <0.30 <0.30 <0.30 <0.30   No results found for this basename: PROBNP,  in the last 168 hours   CHEMISTRY  Recent Labs Lab 12/11/12 0838 12/11/12 0845 12/11/12 1058 12/12/12 0530  NA 140 141  --  132*  K 4.8 4.7  --  3.1*  CL 96 103  --  101  CO2 19  --   --  22  GLUCOSE 146* 148*  --  93  BUN 4* <3*  --  3*  CREATININE 0.54 0.50 0.40* 0.50  CALCIUM 10.0  --   --  7.5*  MG  --   --  1.1* 1.9  PHOS  --   --  2.3 2.3  Estimated Creatinine Clearance: 66.3 ml/min (by C-G formula based on Cr of 0.5).   LIVER  Recent Labs Lab 12/11/12 0838  AST 56*  ALT 15  ALKPHOS 88  BILITOT 0.8  PROT 8.5*  ALBUMIN 4.6  INR 1.23     INFECTIOUS  Recent Labs Lab 12/11/12 0914 12/11/12 1058 12/11/12 1748 12/12/12 0530  LATICACIDVEN 14.10*  --  2.1 1.5  PROCALCITON  --  <0.10  --   --      ENDOCRINE CBG (last 3)   Recent Labs  12/12/12 0747 12/12/12 1215 12/12/12 1537  GLUCAP 83 109* 119*    CXR: NACPD   ASSESSMENT / PLAN:  PULMONARY A: Acute resp failure due to sz/AMS - resolved Smoking hx   P:   Monitor in ICU post extubation Cont nebs PRN  CARDIOVASCULAR A: Sinus Tachycardia, resolved HTN   P:  Monitor  RENAL A:  Lactic acidosis - resolved P:   Monitor BMET intermittently Correct electrolytes as indicated   GASTROINTESTINAL A:  Protein calorie malnutrition  P:   NPO X 4 hrs post extubation Begin diet if no resp problems  HEMATOLOGIC A:  Macrocytic anemia  P:  Follow cbc  Replete  folate Check B12 level  INFECTIOUS A:  No apparent infection  P:   Monitor  ENDOCRINE A:  Minimal hyperglycemia  P:   D/C SSI   NEUROLOGIC A:  Altered Mental status Status epilepticus - resolved Seizure D/O  Subtherapeutic phenytoin level on admission ETOH/Polysubstance abuse  P:   Continue Dilantin  Neuro consult appreciated    TODAY'S SUMMARY:    35 mins CCM time   Billy Fischer, MD ; [facility] service Mobile [phone].  After 5:30 PM or weekends, call [phone]

## 2012-12-12 NOTE — Progress Notes (Signed)
INITIAL NUTRITION ASSESSMENT  DOCUMENTATION CODES Per approved criteria  -Severe malnutrition in the context of chronic illness -Underweight   INTERVENTION: Ensure Complete po BID, each supplement provides 350 kcal and 13 grams of protein.  NUTRITION DIAGNOSIS: Malnutrition related to chronic alcohlism as evidenced by severe fat and muscle wasting and intake </=75% of his needs for >/= 1 month.   Goal: Pt to meet >/= 90% of their estimated nutrition needs   Monitor:  Diet tolerance, PO intake, weight trend, labs   Reason for Assessment: Rounds  67 y.o. male  Admitting Dx: <principal problem not specified>  ASSESSMENT: Pt admitted after seizure in setting of medication non-compliance and polysubstance abuse.  Per pt he starts drinking ETOH, brandy, when he wakes up in the morning. Per pt he drinks about a gallon of brandy every 2 weeks.  Pt likes ensure but states that he cannot afford to buy it much. Pt eats cereal for Breakfast, does not eat lunch and for supper he eats small meal. Pt lives with wife. Sister at bedside.  Pt with hypokalemia being replaced via IVF.   Nutrition Focused Physical Exam:  Subcutaneous Fat:  Orbital Region: severe wasting Upper Arm Region: severe wasting Thoracic and Lumbar Region: severe wasting  Muscle:  Temple Region: severe wasting Clavicle Bone Region: severe wasting Clavicle and Acromion Bone Region: severe wasting Scapular Bone Region: severe wasting Dorsal Hand: severe wasting Patellar Region: severe wasting Anterior Thigh Region: severe wasting Posterior Calf Region: severe wasting  Edema: not present   Height: Ht Readings from Last 1 Encounters:  12/11/12 5\' 8"  (1.727 m)    Weight: Wt Readings from Last 1 Encounters:  12/12/12 116 lb 13.5 oz (53 kg)    Ideal Body Weight: 70 kg   % Ideal Body Weight: 76%  Wt Readings from Last 10 Encounters:  12/12/12 116 lb 13.5 oz (53 kg)  05/15/12 111 lb (50.349 kg)  12/18/11  106 lb 3.2 oz (48.172 kg)  09/15/11 124 lb (56.246 kg)  08/11/11 124 lb 5.4 oz (56.4 kg)  07/06/11 113 lb 8.6 oz (51.5 kg)  03/08/11 128 lb 1.4 oz (58.1 kg)    Usual Body Weight: pt unsure  % Usual Body Weight: -  BMI:  Body mass index is 17.77 kg/(m^2).  Estimated Nutritional Needs: Kcal: 1600-1800 Protein: 80-90 grams Fluid: > 1.6 L/day  Skin: no issues noted  Diet Order: NPO  EDUCATION NEEDS: -No education needs identified at this time   Intake/Output Summary (Last 24 hours) at 12/12/12 1147 Last data filed at 12/12/12 1100  Gross per 24 hour  Intake 3429.6 ml  Output   1493 ml  Net 1936.6 ml    Last BM: PTA   Labs:   Recent Labs Lab 12/11/12 0838 12/11/12 0845 12/11/12 1058 12/12/12 0530  NA 140 141  --  132*  K 4.8 4.7  --  3.1*  CL 96 103  --  101  CO2 19  --   --  22  BUN 4* <3*  --  3*  CREATININE 0.54 0.50 0.40* 0.50  CALCIUM 10.0  --   --  7.5*  MG  --   --  1.1* 1.9  PHOS  --   --  2.3 2.3  GLUCOSE 146* 148*  --  93    CBG (last 3)   Recent Labs  12/11/12 2332 12/12/12 0340 12/12/12 0747  GLUCAP 93 102* 83    Scheduled Meds: . antiseptic oral rinse  15 mL  Mouth Rinse QID  . chlorhexidine  15 mL Mouth Rinse BID  . phenytoin  300 mg Oral QHS    Continuous Infusions: . dextrose 5 % and 0.9 % NaCl with KCl 40 mEq/L 100 mL/hr at 12/12/12 1100    Past Medical History  Diagnosis Date  . Hypertension   . Seizures   . ETOH abuse   . Drug abuse   . PTSD (post-traumatic stress disorder)   . H/O prolonged Q-T interval on ECG   . Polysubstance abuse   . Alcohol abuse     History reviewed. No pertinent past surgical history.  Kendell Bane RD, LDN, CNSC [phone] Pager [phone] After Hours Pager

## 2012-12-13 DIAGNOSIS — F1011 Alcohol abuse, in remission: Secondary | ICD-10-CM

## 2012-12-13 LAB — BASIC METABOLIC PANEL
BUN: <3 mg/dL — ABNORMAL LOW (ref 6–23)
CO2: 25 mEq/L (ref 19–32)
Chloride: 101 mEq/L (ref 96–112)
Glucose, Bld: 128 mg/dL — ABNORMAL HIGH (ref 70–99)
Potassium: 3.3 mEq/L — ABNORMAL LOW (ref 3.5–5.1)

## 2012-12-13 LAB — CBC
HCT: 25.7 % — ABNORMAL LOW (ref 39.0–52.0)
Hemoglobin: 9.7 g/dL — ABNORMAL LOW (ref 13.0–17.0)
MCH: 40.9 pg — ABNORMAL HIGH (ref 26.0–34.0)
MCHC: 38.2 g/dL — ABNORMAL HIGH (ref 30.0–36.0)
MCV: 108 fL — ABNORMAL HIGH (ref 78.0–100.0)

## 2012-12-13 LAB — URINE DRUGS OF ABUSE SCREEN W ALC, ROUTINE (REF LAB)
Amphetamine Screen, Ur: NEGATIVE
Barbiturate Quant, Ur: NEGATIVE
Cocaine Metabolites: NEGATIVE
Creatinine,U: 39.1 mg/dL
Ethyl Alcohol: <10 mg/dL (ref <10)
Methadone: NEGATIVE
Phencyclidine (PCP): NEGATIVE
Propoxyphene: NEGATIVE

## 2012-12-13 LAB — PHENYTOIN LEVEL, TOTAL: Phenytoin Lvl: 25.5 ug/mL — ABNORMAL HIGH (ref 10.0–20.0)

## 2012-12-13 MED ORDER — POTASSIUM CHLORIDE CRYS ER 20 MEQ PO TBCR
40.0000 meq | EXTENDED_RELEASE_TABLET | Freq: Once | ORAL | Status: AC
  Administered 2012-12-13: 40 meq via ORAL
  Filled 2012-12-13: qty 2

## 2012-12-13 MED ORDER — PHENYTOIN SODIUM EXTENDED 300 MG PO CAPS: 300.0000 mg | ORAL_CAPSULE | Freq: Every day | ORAL | Status: DC

## 2012-12-13 NOTE — Progress Notes (Signed)
PULMONARY  / CRITICAL CARE MEDICINE  Name: Miguel Hanson MRN: [id number] DOB: 1946-02-18    ADMISSION DATE:  12/11/2012 CONSULTATION DATE:  12/11/12  REFERRING MD :  EDP PRIMARY SERVICE: PCCM   CHIEF COMPLAINT:  Seizure/Vent support   BRIEF PATIENT DESCRIPTION:  66 M with hx of ETOH /polysubstance abuse, seizure disorder, noncompliance found at home with altered mental status/seizure activity. Brought to ER via EMS with tonic/clonic seizures intubated for airway protection.    SIGNIFICANT EVENTS / STUDIES:  10/12 CT head: NAD 10/13 EEG: this is a normal awake and asleep EEG. Please, be aware that a normal EEG does not exclude the possibility of epilepsy.   LINES / TUBES: 10/12 ETT >> 10/13  CULTURES: 10/12 UC >> NEG  ANTIBIOTICS:   SUBJECTIVE:  No distress. No complaints. Wishes to go home  VITAL SIGNS: Temp:  [98.4 F (36.9 C)-99.4 F (37.4 C)] 99 F (37.2 C) (10/14 0752) Pulse Rate:  [81-126] 88 (10/14 0900) Resp:  [14-24] 18 (10/14 0900) BP: (103-145)/(65-84) 125/74 mmHg (10/14 0900) SpO2:  [92 %-100 %] 94 % (10/14 0900) HEMODYNAMICS:   VENTILATOR SETTINGS:   INTAKE / OUTPUT: Intake/Output     10/13 0701 - 10/14 0700 10/14 0701 - 10/15 0700   P.O. 240    I.V. (mL/kg) 2062.6 (38.9) 261.7 (4.9)   IV Piggyback     Total Intake(mL/kg) 2302.6 (43.4) 261.7 (4.9)   Urine (mL/kg/hr) 1300 (1) 150 (0.9)   Total Output 1300 150   Net +1002.6 +111.7        Urine Occurrence 1 x      PHYSICAL EXAMINATION: General:  NAD Neuro:  No focal deficits HEENT: NCAT, WNL  Cardiovascular:  RRR Lungs: few [place] rhonchi Abdomen: soft, NT, +BS Ext: warm, no edema   LABS: PULMONARY  Recent Labs Lab 12/11/12 0845 12/11/12 0934 12/12/12 0358  PHART  --  7.339* 7.455*  PCO2ART  --  41.0 32.9*  PO2ART  --  338.0* 124.0*  HCO3  --  22.1 22.8  TCO2 23 23 23.8  O2SAT  --  100.0 99.1    CBC  Recent Labs Lab 12/11/12 1058 12/12/12 0530 12/13/12 0425  HGB 12.3*  10.3* 9.7*  HCT 32.2* 27.6* 25.7*  WBC 7.1 8.6 8.5  PLT 124* 104* 90*    COAGULATION  Recent Labs Lab 12/11/12 0838  INR 1.23    CARDIAC    Recent Labs Lab 12/11/12 1058 12/11/12 1532 12/11/12 2215 12/12/12 0530  TROPONINI <0.30 <0.30 <0.30 <0.30   No results found for this basename: PROBNP,  in the last 168 hours   CHEMISTRY  Recent Labs Lab 12/11/12 0838 12/11/12 0845 12/11/12 1058 12/12/12 0530 12/13/12 0425  NA 140 141  --  132* 133*  K 4.8 4.7  --  3.1* 3.3*  CL 96 103  --  101 101  CO2 19  --   --  22 25  GLUCOSE 146* 148*  --  93 128*  BUN 4* <3*  --  3* <3*  CREATININE 0.54 0.50 0.40* 0.50 0.45*  CALCIUM 10.0  --   --  7.5* 7.9*  MG  --   --  1.1* 1.9  --   PHOS  --   --  2.3 2.3  --    Estimated Creatinine Clearance: 66.3 ml/min (by C-G formula based on Cr of 0.45).   LIVER  Recent Labs Lab 12/11/12 0838 12/13/12 0425  AST 56*  --   ALT 15  --  ALKPHOS 88  --   BILITOT 0.8  --   PROT 8.5*  --   ALBUMIN 4.6 2.8*  INR 1.23  --      INFECTIOUS  Recent Labs Lab 12/11/12 0914 12/11/12 1058 12/11/12 1748 12/12/12 0530  LATICACIDVEN 14.10*  --  2.1 1.5  PROCALCITON  --  <0.10  --   --      ENDOCRINE CBG (last 3)   Recent Labs  12/12/12 0747 12/12/12 1215 12/12/12 1537  GLUCAP 83 109* 119*    CXR: NNF   ASSESSMENT: Status epilepticus - resolved Seizure D/O   Medical noncompliance - Subtherapeutic phenytoin level on admission Acute resp failure due to sz/AMS - resolved Smoking hx  Sinus Tachycardia, resolved HTN   Lactic acidosis - resolved Protein calorie malnutrition  Macrocytic anemia - likely EtOH related Minimal hyperglycemia, without hx of DM Altered Mental status ETOH/Polysubstance abuse   PLAN: Discussed need for EtOH abstinence and medical compliance DC home today if OK with Neuro  Billy Fischer, MD ; [facility] service Mobile [phone].  After 5:30 PM or weekends, call [phone]

## 2012-12-13 NOTE — Discharge Summary (Signed)
Physician Discharge Summary     Patient ID: Miguel Hanson MRN: [id number] DOB/AGE: 1945-12-15 67 y.o.  Admit date: 12/11/2012 Discharge date: 12/13/2012  Discharge Diagnoses:   Status epilepticus - resolved  Seizure D/O  Medical noncompliance - Subtherapeutic phenytoin level on admission  Acute resp failure due to sz/AMS - resolved  Smoking hx  Sinus Tachycardia, resolved  HTN  Lactic acidosis - resolved  Protein calorie malnutrition  Macrocytic anemia - likely EtOH related  Minimal hyperglycemia, without hx of DM  Altered Mental status (resolved) ETOH/Polysubstance abuse  Detailed Hospital Course:    Darick Fetters is a 67 y.o. male with history of seizures, alcohol abuse, polysubstance abuse brought to ER via EMS with altered mental status and seizures at home by family, confused with non focal seizures and right eye deviation. Last normal seen by family at 9pm on 10/11. On scene with urine and bowel incontinence. Vomited at home. On arrival to ER , tonic clonic seizure, given versed with snoring resp , intubated for airway protection. Hx of heavy etoh use, etoh level neg in ER. He was started on Dilantin load. Neuro paged by EDP. PCCM to admit. . A CT scan of his brain was done and showed chronic microvascular ischemia, no acute intracranial abnormality. Dilantin level was low at <2.5 . B/P high on arrival to ER. Remains elevated after intubation.Has several past admission for seizures ? etoh w/d seizures. He was admitted to the ICU. His dilantin level was sub therapeutic. He was treated w/ dilantin load and mechanical ventilator. EEG was obtained after dilantin and confirmed no further seizure activity. He was weaned and extubated on 10/13. Dilantin was transitioned to PO. He returned to baseline neuro-status and was deemed ready for d.c as of 10/14.    Discharge Plan by diagnoses  Seizure disorder. (resolved status epilepticus).  Plan: Home on dilantin Instructed on the  importance of ETOH cessation and medication compliance  He will f/u at the [place] Has an appointment in November D/c instructions reviewed w/ wife by RN   HTN Plan: Resume home meds    Significant Hospital tests/ studies/ interventions and procedures  Consults Neuro   SIGNIFICANT EVENTS / STUDIES:  10/12 CT head: NAD  10/13 EEG: this is a normal awake and asleep EEG. Please, be aware that a normal EEG does not exclude the possibility of epilepsy.   LINES / TUBES:  10/12 ETT >> 10/13   CULTURES:  10/12 UC >> NEG   Discharge Exam: BP 125/74  Pulse 93  Temp(Src) 99 F (37.2 C) (Oral)  Resp 16  Ht 5\' 8"  (1.727 m)  Wt 53 kg (116 lb 13.5 oz)  BMI 17.77 kg/m2  SpO2 96%  PHYSICAL EXAMINATION:  General: NAD  Neuro: No focal deficits  HEENT: NCAT, WNL  Cardiovascular: RRR  Lungs: few [place] rhonchi  Abdomen: soft, NT, +BS  Ext: warm, no edema  Labs at discharge Lab Results  Component Value Date   CREATININE 0.45* 12/13/2012   BUN <3* 12/13/2012   NA 133* 12/13/2012   K 3.3* 12/13/2012   CL 101 12/13/2012   CO2 25 12/13/2012   Lab Results  Component Value Date   WBC 8.5 12/13/2012   HGB 9.7* 12/13/2012   HCT 25.7* 12/13/2012   MCV 108.0* 12/13/2012   PLT 90* 12/13/2012   Lab Results  Component Value Date   ALT 15 12/11/2012   AST 56* 12/11/2012   ALKPHOS 88 12/11/2012   BILITOT 0.8 12/11/2012   Lab  Results  Component Value Date   INR 1.23 12/11/2012   INR 1.26 05/06/2010   INR 1.16 07/04/2009    Current radiology studies Dg Chest Port 1 View  12/12/2012   *RADIOLOGY REPORT*  Clinical Data: Intubation  PORTABLE CHEST - 1 VIEW  Comparison: Chest radiograph December 11, 2012  Findings: Cardiac silhouette is unremarkable. Moderately calcified aortic knob.  Mild chronic interstitial changes without pleural effusions or focal consolidations.  Minimal central pulmonary vasculature congestion.  Endotracheal tube tip projects 2.7 cm above the carina. Nasogastric tube  looped in the proximal stomach region. Multiple EKG lines overlay the patient and could obscure underlying subtle pathology.  IMPRESSION: Nasogastric tube looped in region of stomach, no apparent change in the endotracheal tube.  Minimal central pulmonary vasculature congestion, similar.   Original Report Authenticated By: Awilda Metro   Dg Abd Portable 1v  12/11/2012   CLINICAL DATA:  Oral gastric tube placement.  EXAM: PORTABLE ABDOMEN - 1 VIEW  COMPARISON:  None.  FINDINGS: Distal tip of feeding tube is seen in proximal stomach. No abnormal bowel gas pattern is noted.  IMPRESSION: Feeding tube tip seen in proximal stomach.   Electronically Signed   By: Roque Lias [name]   On: 12/11/2012 21:36    Disposition:  01-Home or Self Care      Discharge Orders   Future Orders Complete By Expires   Diet - low sodium heart healthy  As directed    Increase activity slowly  As directed        Medication List         amLODipine 5 MG tablet  Commonly known as:  NORVASC  Take 5 mg by mouth daily.     ibuprofen 400 MG tablet  Commonly known as:  ADVIL,MOTRIN  Take 400 mg by mouth every 6 (six) hours as needed. For leg pain     lisinopril 20 MG tablet  Commonly known as:  PRINIVIL,ZESTRIL  Take 10 mg by mouth daily.     miconazole 2 % powder  Commonly known as:  MICOTIN  Apply 1 application topically as needed. For itching     phenytoin 300 MG ER capsule  Commonly known as:  DILANTIN  Take 1 capsule (300 mg total) by mouth at bedtime.     sertraline 100 MG tablet  Commonly known as:  ZOLOFT  Take 200 mg by mouth daily.     traZODone 50 MG tablet  Commonly known as:  DESYREL  Take 50 mg by mouth at bedtime as needed. For sleep       Follow-up Information   Follow up with Provider Not In System.      [facility] [place]      Discharged Condition: good  Physician Statement:   The Patient was personally examined, the  discharge assessment and plan has been personally reviewed and I agree with ACNP Babcock's assessment and plan. > 30 minutes of time have been dedicated to discharge assessment, planning and discharge instructions.   SignedAnders Simmonds 12/13/2012, 11:31 AM   Agree with above  Billy Fischer, MD ; [facility] service Mobile [phone].  After 5:30 PM or weekends, call [phone]

## 2012-12-13 NOTE — Progress Notes (Signed)
Pt. D/c instructions were reviewed with patient. Patient requested that I call Fayrene Fearing a family friend to bring him clothes and pick him up. Fayrene Fearing picked up the patient and volunteer services took patient out in wheelchair.

## 2012-12-14 ENCOUNTER — Telehealth: Payer: Self-pay | Admitting: Internal Medicine

## 2012-12-14 LAB — BENZODIAZEPINE, QUANTITATIVE, URINE
Alprazolam (GC/LC/MS), ur confirm: NEGATIVE ng/mL
Alprazolam metabolite (GC/LC/MS), ur confirm: NEGATIVE ng/mL
Clonazepam metabolite (GC/LC/MS), ur confirm: NEGATIVE ng/mL
Diazepam (GC/LC/MS), ur confirm: NEGATIVE ng/mL
Flunitrazepam metabolite (GC/LC/MS), ur confirm: NEGATIVE ng/mL
Flurazepam GC/MS Conf: NEGATIVE ng/mL
Midazolam (GC/LC/MS), ur confirm: 4995 ng/mL
Nordiazepam GC/MS Conf: NEGATIVE ng/mL
Temazepam GC/MS Conf: NEGATIVE ng/mL
Triazolam metabolite (GC/LC/MS), ur confirm: NEGATIVE ng/mL

## 2012-12-14 NOTE — Telephone Encounter (Signed)
I spoke with [facility]. I was advised they no longer needed anything from [place].

## 2013-03-01 ENCOUNTER — Emergency Department (HOSPITAL_COMMUNITY): Payer: Medicare Other

## 2013-03-01 ENCOUNTER — Inpatient Hospital Stay (HOSPITAL_COMMUNITY)
Admission: EM | Admit: 2013-03-01 | Discharge: 2013-03-04 | DRG: 091 | Disposition: A | Discharge status: Home / Self Care | Payer: Medicare Other | Attending: Internal Medicine | Admitting: Internal Medicine

## 2013-03-01 ENCOUNTER — Encounter (HOSPITAL_COMMUNITY): Payer: Self-pay | Admitting: Emergency Medicine

## 2013-03-01 DIAGNOSIS — T420X1A Poisoning by hydantoin derivatives, accidental (unintentional), initial encounter: Secondary | ICD-10-CM

## 2013-03-01 DIAGNOSIS — R279 Unspecified lack of coordination: Principal | ICD-10-CM | POA: Diagnosis present

## 2013-03-01 DIAGNOSIS — F1010 Alcohol abuse, uncomplicated: Secondary | ICD-10-CM | POA: Diagnosis present

## 2013-03-01 DIAGNOSIS — R569 Unspecified convulsions: Secondary | ICD-10-CM

## 2013-03-01 DIAGNOSIS — Z91199 Patient's noncompliance with other medical treatment and regimen due to unspecified reason: Secondary | ICD-10-CM

## 2013-03-01 DIAGNOSIS — T420X5A Adverse effect of hydantoin derivatives, initial encounter: Secondary | ICD-10-CM | POA: Diagnosis present

## 2013-03-01 DIAGNOSIS — E43 Unspecified severe protein-calorie malnutrition: Secondary | ICD-10-CM | POA: Diagnosis present

## 2013-03-01 DIAGNOSIS — I10 Essential (primary) hypertension: Secondary | ICD-10-CM | POA: Diagnosis present

## 2013-03-01 DIAGNOSIS — Z681 Body mass index (BMI) 19 or less, adult: Secondary | ICD-10-CM

## 2013-03-01 DIAGNOSIS — G40909 Epilepsy, unspecified, not intractable, without status epilepticus: Secondary | ICD-10-CM | POA: Diagnosis present

## 2013-03-01 DIAGNOSIS — J4489 Other specified chronic obstructive pulmonary disease: Secondary | ICD-10-CM | POA: Diagnosis present

## 2013-03-01 DIAGNOSIS — H532 Diplopia: Secondary | ICD-10-CM | POA: Diagnosis present

## 2013-03-01 DIAGNOSIS — Z9119 Patient's noncompliance with other medical treatment and regimen: Secondary | ICD-10-CM

## 2013-03-01 DIAGNOSIS — J449 Chronic obstructive pulmonary disease, unspecified: Secondary | ICD-10-CM | POA: Diagnosis present

## 2013-03-01 HISTORY — DX: Depression, unspecified: F32.A

## 2013-03-01 HISTORY — DX: Major depressive disorder, single episode, unspecified: F32.9

## 2013-03-01 LAB — ETHANOL: Alcohol, Ethyl (B): <11 mg/dL (ref 0–11)

## 2013-03-01 LAB — RAPID URINE DRUG SCREEN, HOSP PERFORMED
Amphetamines: NOT DETECTED
Barbiturates: NOT DETECTED
Cocaine: NOT DETECTED
Tetrahydrocannabinol: NOT DETECTED

## 2013-03-01 LAB — PROTIME-INR
INR: 1.09 (ref 0.00–1.49)
Prothrombin Time: 13.9 seconds (ref 11.6–15.2)

## 2013-03-01 LAB — URINALYSIS, ROUTINE W REFLEX MICROSCOPIC
Bilirubin Urine: NEGATIVE
Glucose, UA: NEGATIVE mg/dL
Ketones, ur: NEGATIVE mg/dL
Leukocytes, UA: NEGATIVE
Specific Gravity, Urine: 1.014 (ref 1.005–1.030)
Urobilinogen, UA: 0.2 mg/dL (ref 0.0–1.0)
pH: 7.5 (ref 5.0–8.0)

## 2013-03-01 LAB — POCT I-STAT, CHEM 8
Creatinine, Ser: 0.6 mg/dL (ref 0.50–1.35)
Glucose, Bld: 115 mg/dL — ABNORMAL HIGH (ref 70–99)
HCT: 45 % (ref 39.0–52.0)
Hemoglobin: 15.3 g/dL (ref 13.0–17.0)
Potassium: 4.1 mEq/L (ref 3.7–5.3)
Sodium: 135 mEq/L — ABNORMAL LOW (ref 137–147)
TCO2: 29 mmol/L (ref 0–100)

## 2013-03-01 LAB — CBC WITH DIFFERENTIAL/PLATELET
Basophils Absolute: 0 10*3/uL (ref 0.0–0.1)
Basophils Relative: 1 % (ref 0–1)
Eosinophils Absolute: 1.2 10*3/uL — ABNORMAL HIGH (ref 0.0–0.7)
HCT: 36.1 % — ABNORMAL LOW (ref 39.0–52.0)
Hemoglobin: 13.8 g/dL (ref 13.0–17.0)
Lymphocytes Relative: 41 % (ref 12–46)
MCV: 98.4 fL (ref 78.0–100.0)
Monocytes Absolute: 0.6 10*3/uL (ref 0.1–1.0)
Monocytes Relative: 9 % (ref 3–12)
Neutro Abs: 2 10*3/uL (ref 1.7–7.7)
Neutrophils Relative %: 31 % — ABNORMAL LOW (ref 43–77)
RBC: 3.67 MIL/uL — ABNORMAL LOW (ref 4.22–5.81)
RDW: 15.4 % (ref 11.5–15.5)
WBC: 6.4 10*3/uL (ref 4.0–10.5)

## 2013-03-01 LAB — APTT: aPTT: 36 seconds (ref 24–37)

## 2013-03-01 LAB — GLUCOSE, CAPILLARY: Glucose-Capillary: 134 mg/dL — ABNORMAL HIGH (ref 70–99)

## 2013-03-01 MED ORDER — LISINOPRIL 10 MG PO TABS
10.0000 mg | ORAL_TABLET | Freq: Every day | ORAL | Status: DC
  Administered 2013-03-02: 10 mg via ORAL
  Filled 2013-03-01: qty 1

## 2013-03-01 MED ORDER — SODIUM CHLORIDE 0.9 % IV SOLN
INTRAVENOUS | Status: DC
  Administered 2013-03-01 – 2013-03-02 (×2): via INTRAVENOUS

## 2013-03-01 MED ORDER — VITAMIN B-1 100 MG PO TABS
100.0000 mg | ORAL_TABLET | Freq: Every day | ORAL | Status: DC
  Administered 2013-03-01 – 2013-03-03 (×3): 100 mg via ORAL
  Filled 2013-03-01 (×4): qty 1

## 2013-03-01 MED ORDER — SODIUM CHLORIDE 0.9 % IJ SOLN
3.0000 mL | Freq: Two times a day (BID) | INTRAMUSCULAR | Status: DC
  Administered 2013-03-02 – 2013-03-04 (×3): 3 mL via INTRAVENOUS

## 2013-03-01 MED ORDER — THIAMINE HCL 100 MG/ML IJ SOLN
100.0000 mg | Freq: Every day | INTRAMUSCULAR | Status: DC
  Filled 2013-03-01 (×2): qty 1

## 2013-03-01 MED ORDER — FOLIC ACID 1 MG PO TABS
1.0000 mg | ORAL_TABLET | Freq: Every day | ORAL | Status: DC
Start: 2013-03-01 — End: 2013-03-04
  Administered 2013-03-01 – 2013-03-04 (×4): 1 mg via ORAL
  Filled 2013-03-01 (×4): qty 1

## 2013-03-01 MED ORDER — SERTRALINE HCL 100 MG PO TABS
200.0000 mg | ORAL_TABLET | Freq: Every day | ORAL | Status: DC
  Administered 2013-03-02 – 2013-03-04 (×3): 200 mg via ORAL
  Filled 2013-03-01 (×3): qty 2

## 2013-03-01 MED ORDER — LORAZEPAM 1 MG PO TABS: 1.0000 mg | ORAL_TABLET | Freq: Four times a day (QID) | ORAL | Status: DC | PRN

## 2013-03-01 MED ORDER — ADULT MULTIVITAMIN W/MINERALS CH
1.0000 | ORAL_TABLET | Freq: Every day | ORAL | Status: DC
  Administered 2013-03-01 – 2013-03-04 (×4): 1 via ORAL
  Filled 2013-03-01 (×4): qty 1

## 2013-03-01 MED ORDER — ENOXAPARIN SODIUM 40 MG/0.4ML ~~LOC~~ SOLN
40.0000 mg | SUBCUTANEOUS | Status: DC
  Administered 2013-03-01 – 2013-03-03 (×3): 40 mg via SUBCUTANEOUS
  Filled 2013-03-01 (×4): qty 0.4

## 2013-03-01 MED ORDER — SODIUM CHLORIDE 0.9 % IV SOLN: INTRAVENOUS | Status: DC

## 2013-03-01 MED ORDER — THIAMINE HCL 100 MG/ML IJ SOLN
100.0000 mg | Freq: Once | INTRAMUSCULAR | Status: AC
  Administered 2013-03-01: 100 mg via INTRAVENOUS
  Filled 2013-03-01: qty 2

## 2013-03-01 MED ORDER — AMLODIPINE BESYLATE 5 MG PO TABS
5.0000 mg | ORAL_TABLET | Freq: Every day | ORAL | Status: DC
  Administered 2013-03-02: 5 mg via ORAL
  Filled 2013-03-01: qty 1

## 2013-03-01 MED ORDER — LORAZEPAM 2 MG/ML IJ SOLN: 1.0000 mg | Freq: Four times a day (QID) | INTRAMUSCULAR | Status: DC | PRN

## 2013-03-01 NOTE — ED Provider Notes (Signed)
CSN: [id number]     Arrival date & time 03/01/13  1036 History   First MD Initiated Contact with Patient 03/01/13 1115     Chief Complaint  Patient presents with  . Weakness    "unstable when walking"   (Consider location/radiation/quality/duration/timing/severity/associated sxs/prior Treatment) HPI Comments: Patient is 67 year old frail appearing male who presents to the ED with a 5 day history of dizziness, double vision and difficulty walking.  He reports that starting when he awoke on Friday he began to notice that he could not walk.  He reported feeling dizzy and has been progressively unable to walk since then.  He states that he feels weaker on his left side of his body than his right.  He also reports double vision that started at that time as well.  He has a history seizures as well but none recently.  He reports that on Saturday he got up to go to the bathroom and fell into the fall scraping the tip of his nose.  He denies any other falls.  He reports mild headache, but denies chest pain, shortness of breath, abdominal pain, nausea, vomiting, diarrhea, pelvis or hip pain, lower back pain.  Patient is a 67 y.o. male presenting with weakness. The history is provided by the patient and a relative. No language interpreter was used.  Weakness This is a new problem. The current episode started in the past 7 days. The problem occurs constantly. The problem has been unchanged. Associated symptoms include a visual change and weakness. Pertinent negatives include no abdominal pain, anorexia, chest pain, congestion, coughing, fever, headaches, myalgias, nausea, neck pain, numbness, rash, urinary symptoms or vomiting. Nothing aggravates the symptoms. He has tried nothing for the symptoms. The treatment provided no relief.    Past Medical History  Diagnosis Date  . Hypertension   . Seizures   . ETOH abuse   . Drug abuse   . PTSD (post-traumatic stress disorder)   . H/O prolonged Q-T interval on  ECG   . Polysubstance abuse   . Alcohol abuse    History reviewed. No pertinent past surgical history. No family history on file. History  Substance Use Topics  . Smoking status: Current Some Day Smoker -- 0.50 packs/day for 40 years    Types: Cigarettes  . Smokeless tobacco: Not on file  . Alcohol Use: Yes     Comment: abuse/binge    Review of Systems  Constitutional: Negative for fever.  HENT: Negative for congestion.   Respiratory: Negative for cough.   Cardiovascular: Negative for chest pain.  Gastrointestinal: Negative for nausea, vomiting, abdominal pain and anorexia.  Musculoskeletal: Negative for myalgias and neck pain.  Skin: Negative for rash.  Neurological: Positive for weakness. Negative for numbness and headaches.  All other systems reviewed and are negative.    Allergies  Review of patient's allergies indicates no known allergies.  Home Medications   Current Outpatient Rx  Name  Route  Sig  Dispense  Refill  . amLODipine (NORVASC) 5 MG tablet   Oral   Take 5 mg by mouth daily.         Marland Kitchen lisinopril (PRINIVIL,ZESTRIL) 20 MG tablet   Oral   Take 10 mg by mouth daily.          . phenytoin (DILANTIN) 300 MG ER capsule   Oral   Take 1 capsule (300 mg total) by mouth at bedtime.   30 capsule   6   . sertraline (ZOLOFT) 100  MG tablet   Oral   Take 200 mg by mouth daily.          BP 143/95  Pulse 83  Temp(Src) 98 F (36.7 C) (Oral)  Resp 18  SpO2 99% Physical Exam  Nursing note and vitals reviewed. Constitutional: He is oriented to person, place, and time. He appears well-developed. He appears cachectic.  HENT:  Head: Normocephalic.  Right Ear: External ear normal.  Left Ear: External ear normal.  Mouth/Throat: Oropharynx is clear and moist. No oropharyngeal exudate.  Abrasion to tip of nose  Eyes: Conjunctivae and EOM are normal. Pupils are equal, round, and reactive to light. No scleral icterus.  Neck: Normal range of motion. Neck  supple. No JVD present.  Cardiovascular: Normal rate, regular rhythm and normal heart sounds.  Exam reveals no gallop and no friction rub.   No murmur heard. Pulmonary/Chest: Effort normal and breath sounds normal. No stridor. No respiratory distress. He has no wheezes. He has no rales. He exhibits no tenderness.  Abdominal: Soft. Bowel sounds are normal. He exhibits no distension. There is no tenderness. There is no rebound and no guarding.  Musculoskeletal: He exhibits no edema and no tenderness.  Lymphadenopathy:    He has no cervical adenopathy.  Neurological: He is alert and oriented to person, place, and time. He displays normal reflexes. No cranial nerve deficit or sensory deficit. He exhibits normal muscle tone. Coordination and gait abnormal.  Ataxic gait, unable to do heel shin, downward Babinski  Skin: Skin is warm and dry. No rash noted. No erythema. No pallor.  Psychiatric: He has a normal mood and affect. His behavior is normal. Judgment and thought content normal.    ED Course  Procedures (including critical care time) Labs Review Labs Reviewed  CBC WITH DIFFERENTIAL - Abnormal; Notable for the following:    RBC 3.67 (*)    HCT 36.1 (*)    MCH 37.6 (*)    MCHC 38.2 (*)    Platelets 137 (*)    Neutrophils Relative % 31 (*)    Eosinophils Relative 18 (*)    Eosinophils Absolute 1.2 (*)    All other components within normal limits  GLUCOSE, CAPILLARY - Abnormal; Notable for the following:    Glucose-Capillary 134 (*)    All other components within normal limits  PHENYTOIN LEVEL, TOTAL - Abnormal; Notable for the following:    Phenytoin Lvl 58.4 (*)    All other components within normal limits  POCT I-STAT, CHEM 8 - Abnormal; Notable for the following:    Sodium 135 (*)    BUN <3 (*)    Glucose, Bld 115 (*)    Calcium, Ion 1.10 (*)    All other components within normal limits  APTT  PROTIME-INR  ETHANOL  URINALYSIS, ROUTINE W REFLEX MICROSCOPIC  URINE RAPID DRUG  SCREEN (HOSP PERFORMED)   Imaging Review [name] Contrast  03/01/2013   CLINICAL DATA:  Weakness.  Unsteady gait, worsening symptoms.  EXAM: MRI HEAD WITHOUT CONTRAST  TECHNIQUE: Multiplanar, multiecho pulse sequences of the brain and surrounding structures were obtained without intravenous contrast.  COMPARISON:  CT of the head December 11, 2012 and MRI of the head March 08, 2011.  FINDINGS: No reduced diffusion to suggest acute ischemia. No susceptibility artifact to suggest hemorrhage.  Mild ventriculomegaly, likely on the basis of global parenchymal brain volume loss as there is commensurate enlargement of cerebral sulci and cerebellar folia, unchanged from prior examination. Confluent supratentorial white matter  T2 hyperintensities are seen, predominantly in the supratentorial brain common unchanged from prior examination. Thin curvilinear T2 hyperintensity in the left basal ganglia may reflect remote lacunar infarct or even artifact on this moderately motion degraded examination. No midline shift or mass effect.  No abnormal extra-axial fluid collections. Normal major intracranial vascular flow voids observed at the skull base, with mild dolichoectasia which suggest chronic hypertension.  Fluid intensity within the left frontal sinus is new, similar partial opacification of left sphenoid sinus without paranasal sinus air-fluid levels. Mastoid air cells are well aerated.  Slightly flattened appearance of the ocular globes posteriorly bilaterally, at the level of the optic disc though, this is only well demonstrated on the axial T2 sequence. No abnormal sellar expansion. Craniocervical junction maintained. No suspicious calvarial bone marrow signal. The patient appears edentulous.  IMPRESSION: No acute intracranial process.  Motion degraded examination: Similar appearance the head with global parenchymal brain volume loss upper limits of normal. Moderate to severe white matter changes suggest chronic  small vessel ischemic disease without acute component.  Paranasal sinusitis.  Apparent mild flattening of the optic discs which may be artifact due to motion though can be seen with intracranial hypertension.   Electronically Signed   By: Awilda Metro   On: 03/01/2013 12:56    EKG Interpretation    Date/Time:  Wednesday March 01 2013 13:12:57 EST Ventricular Rate:  76 PR Interval:  214 QRS Duration: 92 QT Interval:  469 QTC Calculation: 527 R Axis:   73 Text Interpretation:  Sinus rhythm Borderline prolonged PR interval Prolonged QT interval Since last tracing rate slower Confirmed by Ethelda Chick  MD, SAM ([id number]) on 03/01/2013 2:07:25 PM           2:07 PM Symptoms concerning for posterior circulation CVA, also with history of ETOH and drug abuse, will get UDS, ETOH level, Wernicke is also in the differential, will give thiamine IV  2:07 PM Dilantin level is 58 - MRI negative for posterior circulation infarct, will get admitted to teaching service.   MDM  Dilantin Toxicity  Patient here with dilantin toxicity - stable at this time for admission, will place on telemetry and Dr. Josem Kaufmann is the attending.    Izola Price Marisue Humble, PA-C 03/01/13 1329  Scarlette Calico C. Marisue Humble, PA-C 03/01/13 1407

## 2013-03-01 NOTE — H&P (Signed)
Date: 03/01/2013               Patient Name:  Miguel Hanson MRN: [id number]  DOB: 1945/12/29 Age / Sex: 67 y.o., male   PCP: Provider Not In System         Medical Service: Internal Medicine Teaching Service         Attending Physician: Dr. Rocco Serene, MD    First Contact: Dr. Windell Hummingbird Pager: [phone]  Second Contact: Dr. Darden Palmer Pager: [phone]       After Hours (After 5p/  First Contact Pager: [phone]  weekends / holidays): Second Contact Pager: [phone]   Chief Complaint: ataxia and blurry vision  History of Present Illness:  This is a 67yo AAM w/ PMH seizure, polysubstance abuse, PTSD, COPD, HTN who presents with difficulty with balance and double vision x 1 week.  Of note, patient is a poor historian. Patient notes that last Wednesday he noticed he was having trouble walking and became unsteady on his feet, endorses falling yesterday and scraping his nose but no other falls. Patient endorsed having double vision over this past week, but denied having any double or blurry vision at the time of the interview. At time of our interview, patient reports that his symptoms had resolved, though when we got him out of bed he was clearly unsteady. Pt last took his dilantin this morning, he usually takes this at night, but forgot last night. He cannot recall when his last seizure was. Denies CP, SOB, dizziness, focal weakness or altered sensation, abd pain, N/V/D, difficulty speaking. Pt lives with his wife at home.   Meds: Medications Prior to Admission  Medication Sig Dispense Refill  . amLODipine (NORVASC) 5 MG tablet Take 5 mg by mouth daily.      Marland Kitchen lisinopril (PRINIVIL,ZESTRIL) 20 MG tablet Take 10 mg by mouth daily.       . phenytoin (DILANTIN) 300 MG ER capsule Take 1 capsule (300 mg total) by mouth at bedtime.  30 capsule  6  . sertraline (ZOLOFT) 100 MG tablet Take 200 mg by mouth daily.        Allergies: Allergies as of 03/01/2013  . (No Known Allergies)     Past Medical History  Diagnosis Date  . Hypertension   . Seizures   . ETOH abuse   . Drug abuse   . PTSD (post-traumatic stress disorder)   . H/O prolonged Q-T interval on ECG   . Polysubstance abuse   . Alcohol abuse    History reviewed. No pertinent past surgical history. No family history on file. History   Social History  . Marital Status: Married    Spouse Name: N/A    Number of Children: N/A  . Years of Education: N/A   Occupational History  . Not on file.   Social History Main Topics  . Smoking status: Current Some Day Smoker -- 0.50 packs/day for 40 years    Types: Cigarettes  . Smokeless tobacco: Not on file  . Alcohol Use: Yes- drinks approx 1/2 pint per day, last drink Wednesday last week     Comment: abuse/binge  . Drug Use: Yes    Special: Marijuana     Comment: per spouse per ems  . Sexual Activity: No   Other Topics Concern  . Not on file   Social History Narrative  . No narrative on file   Review of Systems: General: no fevers, chills, changes in weight, changes in appetite Skin:  no rash HEENT: see HPI Pulm: no dyspnea, coughing, wheezing CV: no chest pain, palpitations, shortness of breath Abd: no abdominal pain, nausea/vomiting, diarrhea/constipation GU: no dysuria, hematuria, polyuria Ext: no arthralgias, myalgias Neuro: see HPI  Physical Exam: Blood pressure 138/82, pulse 79, temperature 98.3 F (36.8 C), temperature source Oral, resp. rate 18, height 5\' 9"  (1.753 m), weight 52.164 kg (115 lb), SpO2 97.00%.  General: thin, disheveled appearing, alert, cooperative, and in no apparent distress; pt following commands, though had trouble understanding some questions/commands HEENT: pupils small, but equal round and reactive to light, vision grossly intact, no evidence of double vision; oropharynx clear and non-erythematous, MMM Neck: supple Lungs: clear to ascultation bilaterally, normal work of respiration, no wheezes, rales,  ronchi Heart: regular rate and rhythm, no murmurs, gallops, or rubs Abdomen: soft, non-tender, non-distended, normal bowel sounds Extremities: warm, thin extremities bilaterally, no BLE edema Skin: abrasion to tip of nose Neurologic: alert & oriented X3, normal speech, cranial nerves II-XII intact, strength 5/5 throughout, sensation intact to light touch; +disdiadochokinesia on finger nose, very unsteady standing upright and with one step almost fell over; able to name objects appropriately and repetition intact  Lab results: Basic Metabolic Panel:  Recent Labs  96/04/54 1200  NA 135*  K 4.1  CL 97  GLUCOSE 115*  BUN <3*  CREATININE 0.60   CBC:  Recent Labs  03/01/13 1126 03/01/13 1200  WBC 6.4  --   NEUTROABS 2.0  --   HGB 13.8 15.3  HCT 36.1* 45.0  MCV 98.4  --   PLT 137*  --    CBG:  Recent Labs  03/01/13 1120  GLUCAP 134*   Coagulation:  Recent Labs  03/01/13 1126  LABPROT 13.9  INR 1.09   Alcohol Level:  Recent Labs  03/01/13 1140  ETH <11   Misc. Labs: Dilantin level 58.4  Imaging results:  [name] Contrast  03/01/2013   CLINICAL DATA:  Weakness.  Unsteady gait, worsening symptoms.  EXAM: MRI HEAD WITHOUT CONTRAST  TECHNIQUE: Multiplanar, multiecho pulse sequences of the brain and surrounding structures were obtained without intravenous contrast.  COMPARISON:  CT of the head December 11, 2012 and MRI of the head March 08, 2011.  FINDINGS: No reduced diffusion to suggest acute ischemia. No susceptibility artifact to suggest hemorrhage.  Mild ventriculomegaly, likely on the basis of global parenchymal brain volume loss as there is commensurate enlargement of cerebral sulci and cerebellar folia, unchanged from prior examination. Confluent supratentorial white matter T2 hyperintensities are seen, predominantly in the supratentorial brain common unchanged from prior examination. Thin curvilinear T2 hyperintensity in the left basal ganglia may reflect  remote lacunar infarct or even artifact on this moderately motion degraded examination. No midline shift or mass effect.  No abnormal extra-axial fluid collections. Normal major intracranial vascular flow voids observed at the skull base, with mild dolichoectasia which suggest chronic hypertension.  Fluid intensity within the left frontal sinus is new, similar partial opacification of left sphenoid sinus without paranasal sinus air-fluid levels. Mastoid air cells are well aerated.  Slightly flattened appearance of the ocular globes posteriorly bilaterally, at the level of the optic disc though, this is only well demonstrated on the axial T2 sequence. No abnormal sellar expansion. Craniocervical junction maintained. No suspicious calvarial bone marrow signal. The patient appears edentulous.  IMPRESSION: No acute intracranial process.  Motion degraded examination: Similar appearance the head with global parenchymal brain volume loss upper limits of normal. Moderate to severe white matter changes  suggest chronic small vessel ischemic disease without acute component.  Paranasal sinusitis.  Apparent mild flattening of the optic discs which may be artifact due to motion though can be seen with intracranial hypertension.   Electronically Signed   By: Awilda Metro   On: 03/01/2013 12:56   Other results: EKG: NSR, normal axis, long PR and QT intervals, no t wave changes.  Assessment & Plan by Problem:  # Phenytoin toxicity: Patient presents with ataxia and double vision x 1 week. His phenytoin level is elevated, and his symptoms are consistent with phenytoin toxicity. Pt has MRI in ED which was negative for acute infarct making CVA or intracranial lesion very unlikely. Patient's symptoms are suspicious for posterior circulation insufficiency, which is possible, though the constant nature of his symptoms make this diagnosis less likely.  -hold dilantin -bed rest -PT/OT when pt improves -CMP in AM- only iSTAT  done in ED -NPO until SLP evaluates swallowing ability - EKG in AM -telemetry -neuro checks q 2h -UDS -IVF- NS at 17ml/hr  # HTN: BP stable, 143/95 on admission.   -Continue home regimen: lisinopril 10mg  daily and amlodipine 5mg  (though pt seems unsure if he takes amlodipine at home)  # Alcohol abuse: Pt admits to drinking approx 1/2 pint of hard alcohol per day, last drink 1 week ago. Patient is an inconsistent historian, therefore even though he is out of the window for alcohol withdrawal per history, I will monitor on CIWA protocol. Target cells seen on blood smear, which can be seen in chronic alcoholism. Coag panel wnl. Alcohol level wnl on admission. -CIWA  # Hx of seizures: Unknown last seizure date. Holding phenytoin in setting of toxicity.  # PTSD: Continue zoloft 200mg  daily  # VTE: lovenox  # Diet: NPO   Code status: Full   Dispo: Disposition is deferred at this time, awaiting improvement of current medical problems. Anticipated discharge in approximately 1-2 day(s).   The patient does not have a current PCP (Provider Not In System) and does need an [facility] hospital follow-up appointment after discharge.  The patient does not have transportation limitations that hinder transportation to clinic appointments.  Signed: Windell Hummingbird, MD 03/01/2013, 3:29 PM

## 2013-03-01 NOTE — ED Notes (Signed)
Pt. Stated, I just can't walk my legs are all wobbley for about a week. And I feel shakey

## 2013-03-01 NOTE — ED Provider Notes (Signed)
Medical screening examination/treatment/procedure(s) were conducted as a shared visit with non-physician practitioner(s) and myself.  I personally evaluated the patient during the encounter.  EKG Interpretation    Date/Time:  Wednesday March 01 2013 13:12:57 EST Ventricular Rate:  76 PR Interval:  214 QRS Duration: 92 QT Interval:  469 QTC Calculation: 527 R Axis:   73 Text Interpretation:  Sinus rhythm Borderline prolonged PR interval Prolonged QT interval Since last tracing rate slower Confirmed by Ethelda Chick  MD, Corisa Montini ([id number]) on 03/01/2013 2:07:25 PM             Doug Sou, MD 03/01/13 2026

## 2013-03-01 NOTE — ED Notes (Signed)
Rennis Chris, MD made aware of abnormal Phenytoin level

## 2013-03-01 NOTE — ED Provider Notes (Signed)
Complains of inability to walk since Christmas 2014. Has fallen 2 times. No injury. On exam chronically ill-appearing neurologic Glasgow Coma Score 15. Motor strength 5 over 5 overall DTRs symmetric bilaterally knee jerk ankle jerk biceps toes downgoing bilaterally he is unable to perform heel to shin. He is ataxic.  Doug Sou, MD 03/01/13 1140

## 2013-03-02 DIAGNOSIS — I10 Essential (primary) hypertension: Secondary | ICD-10-CM

## 2013-03-02 DIAGNOSIS — T420X1A Poisoning by hydantoin derivatives, accidental (unintentional), initial encounter: Secondary | ICD-10-CM

## 2013-03-02 DIAGNOSIS — R569 Unspecified convulsions: Secondary | ICD-10-CM

## 2013-03-02 LAB — COMPREHENSIVE METABOLIC PANEL
ALT: 11 U/L (ref 0–53)
AST: 16 U/L (ref 0–37)
Albumin: 3.1 g/dL — ABNORMAL LOW (ref 3.5–5.2)
Alkaline Phosphatase: 86 U/L (ref 39–117)
BUN: 5 mg/dL — ABNORMAL LOW (ref 6–23)
CO2: 25 mEq/L (ref 19–32)
Calcium: 8.1 mg/dL — ABNORMAL LOW (ref 8.4–10.5)
Chloride: 99 mEq/L (ref 96–112)
Creatinine, Ser: 0.55 mg/dL (ref 0.50–1.35)
GFR calc Af Amer: >90 mL/min (ref >90)
GFR calc non Af Amer: >90 mL/min (ref >90)
Glucose, Bld: 87 mg/dL (ref 70–99)
Potassium: 4.1 mEq/L (ref 3.7–5.3)
Sodium: 136 mEq/L — ABNORMAL LOW (ref 137–147)
Total Bilirubin: 0.4 mg/dL (ref 0.3–1.2)
Total Protein: 6.3 g/dL (ref 6.0–8.3)

## 2013-03-02 LAB — GLUCOSE, CAPILLARY
GLUCOSE-CAPILLARY: 110 mg/dL — AB (ref 70–99)
Glucose-Capillary: 98 mg/dL (ref 70–99)
Glucose-Capillary: 93 mg/dL (ref 70–99)

## 2013-03-02 MED ORDER — LISINOPRIL 10 MG PO TABS
10.0000 mg | ORAL_TABLET | Freq: Every day | ORAL | Status: DC
  Administered 2013-03-03 – 2013-03-04 (×2): 10 mg via ORAL
  Filled 2013-03-02 (×2): qty 1

## 2013-03-02 MED ORDER — AMLODIPINE BESYLATE 5 MG PO TABS
5.0000 mg | ORAL_TABLET | Freq: Every day | ORAL | Status: DC
  Administered 2013-03-03 – 2013-03-04 (×2): 5 mg via ORAL
  Filled 2013-03-02 (×2): qty 1

## 2013-03-02 NOTE — Progress Notes (Addendum)
Subjective: Patient feeling fine this morning, no new complaints. He does not have any vision changes. He has not ambulated, so does not know how his unsteadiness is progressing. VSS. Eating well.   Objective: Vital signs in last 24 hours: Filed Vitals:   03/01/13 1306 03/01/13 1436 03/01/13 2115 03/02/13 0515  BP: 143/95 138/82 129/79 129/82  Pulse: 83 79 83 73  Temp:  98.3 F (36.8 C) 98.7 F (37.1 C) 98.3 F (36.8 C)  TempSrc:  Oral Oral Oral  Resp: 18 18 16 15   Height:  5\' 9"  (1.753 m)    Weight:  52.164 kg (115 lb)    SpO2: 99% 97% 98% 96%   Weight change:   Intake/Output Summary (Last 24 hours) at 03/02/13 1153 Last data filed at 03/02/13 0600  Gross per 24 hour  Intake   1000 ml  Output    500 ml  Net    500 ml   Physical Exam General: thin, disheveled appearing, alert, cooperative, and in no apparent distress HEENT: NCAT, MMM Neck: supple Lungs: clear to ascultation bilaterally, normal work of respiration, no wheezes, rales, ronchi Heart: regular rate and rhythm, no murmurs, gallops, or rubs Abdomen: soft, non-tender, non-distended, normal bowel sounds  Extremities: warm, thin extremities bilaterally, no BLE edema  Skin: abrasion to tip of nose  Neurologic: alert & oriented X3, normal speech, cranial nerves II-XII intact, strength and sensation grossly intact, he is very unsteady standing upright and with one step almost fell over  Lab Results: Basic Metabolic Panel:  Recent Labs Lab 03/01/13 1200 03/02/13 0610  NA 135* 136*  K 4.1 4.1  CL 97 99  CO2  --  25  GLUCOSE 115* 87  BUN <3* 5*  CREATININE 0.60 0.55  CALCIUM  --  8.1*   Liver Function Tests:  Recent Labs Lab 03/02/13 0610  AST 16  ALT 11  ALKPHOS 86  BILITOT 0.4  PROT 6.3  ALBUMIN 3.1*   CBC:  Recent Labs Lab 03/01/13 1126 03/01/13 1200  WBC 6.4  --   NEUTROABS 2.0  --   HGB 13.8 15.3  HCT 36.1* 45.0  MCV 98.4  --   PLT 137*  --    CBG:  Recent Labs Lab  03/01/13 1120 03/02/13 0738  GLUCAP 134* 110*   Coagulation:  Recent Labs Lab 03/01/13 1126  LABPROT 13.9  INR 1.09   Urine Drug Screen: Drugs of Abuse     Component Value Date/Time   LABOPIA NONE DETECTED 03/01/2013 1726   LABOPIA NEGATIVE 12/11/2012 1207   COCAINSCRNUR NONE DETECTED 03/01/2013 1726   COCAINSCRNUR NEGATIVE 12/11/2012 1207   LABBENZ NONE DETECTED 03/01/2013 1726   LABBENZ POSITIVE* 12/11/2012 1207   AMPHETMU NONE DETECTED 03/01/2013 1726   AMPHETMU NEGATIVE 12/11/2012 1207   THCU NONE DETECTED 03/01/2013 1726   LABBARB NONE DETECTED 03/01/2013 1726    Alcohol Level:  Recent Labs Lab 03/01/13 1140  ETH <11   Urinalysis:  Recent Labs Lab 03/01/13 1726  COLORURINE YELLOW  LABSPEC 1.014  PHURINE 7.5  GLUCOSEU NEGATIVE  HGBUR NEGATIVE  BILIRUBINUR NEGATIVE  KETONESUR NEGATIVE  PROTEINUR NEGATIVE  UROBILINOGEN 0.2  NITRITE NEGATIVE  LEUKOCYTESUR NEGATIVE   Studies/Results: [name] Contrast  03/01/2013   CLINICAL DATA:  Weakness.  Unsteady gait, worsening symptoms.  EXAM: MRI HEAD WITHOUT CONTRAST  TECHNIQUE: Multiplanar, multiecho pulse sequences of the brain and surrounding structures were obtained without intravenous contrast.  COMPARISON:  CT of the head December 11, 2012 and MRI of the head March 08, 2011.  FINDINGS: No reduced diffusion to suggest acute ischemia. No susceptibility artifact to suggest hemorrhage.  Mild ventriculomegaly, likely on the basis of global parenchymal brain volume loss as there is commensurate enlargement of cerebral sulci and cerebellar folia, unchanged from prior examination. Confluent supratentorial white matter T2 hyperintensities are seen, predominantly in the supratentorial brain common unchanged from prior examination. Thin curvilinear T2 hyperintensity in the left basal ganglia may reflect remote lacunar infarct or even artifact on this moderately motion degraded examination. No midline shift or mass effect.   No abnormal extra-axial fluid collections. Normal major intracranial vascular flow voids observed at the skull base, with mild dolichoectasia which suggest chronic hypertension.  Fluid intensity within the left frontal sinus is new, similar partial opacification of left sphenoid sinus without paranasal sinus air-fluid levels. Mastoid air cells are well aerated.  Slightly flattened appearance of the ocular globes posteriorly bilaterally, at the level of the optic disc though, this is only well demonstrated on the axial T2 sequence. No abnormal sellar expansion. Craniocervical junction maintained. No suspicious calvarial bone marrow signal. The patient appears edentulous.  IMPRESSION: No acute intracranial process.  Motion degraded examination: Similar appearance the head with global parenchymal brain volume loss upper limits of normal. Moderate to severe white matter changes suggest chronic small vessel ischemic disease without acute component.  Paranasal sinusitis.  Apparent mild flattening of the optic discs which may be artifact due to motion though can be seen with intracranial hypertension.   Electronically Signed   By: Elon Alas   On: 03/01/2013 12:56   Medications: I have reviewed the patient's current medications. Scheduled Meds: . amLODipine  5 mg Oral Daily  . enoxaparin (LOVENOX) injection  40 mg Subcutaneous Q24H  . folic acid  1 mg Oral Daily  . lisinopril  10 mg Oral Daily  . multivitamin with minerals  1 tablet Oral Daily  . sertraline  200 mg Oral Daily  . sodium chloride  3 mL Intravenous Q12H  . thiamine  100 mg Oral Daily   Or  . thiamine  100 mg Intravenous Daily   Continuous Infusions:  PRN Meds:.LORazepam, LORazepam  Assessment/Plan:  # Phenytoin toxicity: Symptoms and exam unchanged from yesterday. Continue to hold dilantin. Plan is to watch and wait for improvement. Will recheck dilantin level when pt improves or if pt does not improve will recheck level in 2 days.  Given variable half life of phenytoin, suspect improvement within 24-48 hrs. EKG improved this morning, though PR seems to be chronically prolonged, likely as a side effect of dilantin.  -hold dilantin  -bed rest  -PT/OT when pt improves  -regular diet  -telemetry- consider discontinuing tomorrow -neuro checks q4h  -d/c IVF  # HTN: BP stable, 129/82 today.  -Continue home regimen: lisinopril 10mg  daily and amlodipine 5mg   # Alcohol abuse: No evidence of alcohol withdrawal, no ativan given overnight. Will continue CIWA for one more day.   # Hx of seizures: Unknown last seizure date. Holding phenytoin in setting of toxicity.   # PTSD: Continue zoloft 200mg  daily   # VTE: lovenox   # Diet: regular diet   Code status: Full   Dispo: Disposition is deferred at this time, awaiting improvement of current medical problems.  Anticipated discharge in approximately 1-2 day(s).   The patient does have a current PCP [facility]) and does not need an [facility] hospital follow-up appointment after discharge.  The patient  does not have transportation limitations that hinder transportation to clinic appointments.  .Services Needed at time of discharge: Y = Yes, Blank = No PT:   OT:   RN:   Equipment:   Other:     LOS: 1 day   Rebecca Eaton, MD 03/02/2013, 11:53 AM

## 2013-03-02 NOTE — Evaluation (Signed)
Clinical/Bedside Swallow Evaluation Patient Details  Name: Miguel Hanson MRN: [id number] Date of Birth: 26-Jun-1945  Today's Date: 03/02/2013 Time: 2119-4174 SLP Time Calculation (min): 8 min  Past Medical History:  Past Medical History  Diagnosis Date  . Hypertension   . Seizures   . ETOH abuse   . Drug abuse   . PTSD (post-traumatic stress disorder)   . H/O prolonged Q-T interval on ECG   . Polysubstance abuse   . Alcohol abuse   . Depression    Past Surgical History:  Past Surgical History  Procedure Laterality Date  . No past surgeries     HPI:   68yo AAM w/ PMH seizure, polysubstance abuse, PTSD, COPD, HTN who presents with difficulty with balance and double vision x 1 week.  Dx include Phenytoin toxicity, HTN.      Assessment / Plan / Recommendation Clinical Impression  Pt presents with normal swallow function with adequate mastication despite absence of teeth, swift swallow trigger, and no s/s of aspiration.  Recommend continuing mechanical soft diet given lack of teeth - pt agrees.  No SLP f/u warranted.        Diet Recommendation Dysphagia 3 (Mechanical Soft);Thin liquid   Liquid Administration via: Cup;Straw Medication Administration: Whole meds with liquid Supervision: Patient able to self feed    Other  Recommendations Oral Care Recommendations: Oral care BID   Follow Up Recommendations  None        Swallow Study Prior Functional Status       General Date of Onset: 03/01/13 HPI:  68yo AAM w/ PMH seizure, polysubstance abuse, PTSD, COPD, HTN who presents with difficulty with balance and double vision x 1 week.  Dx include Phenytoin toxicity, HTN.    Type of Study: Bedside swallow evaluation Previous Swallow Assessment: none Diet Prior to this Study: Dysphagia 3 (soft);Thin liquids Temperature Spikes Noted: No Respiratory Status: Room air Behavior/Cognition: Alert;Cooperative;Pleasant mood Oral Cavity - Dentition: Missing dentition Self-Feeding  Abilities: Able to feed self Patient Positioning: Upright in bed Baseline Vocal Quality: Clear Volitional Cough: Strong Volitional Swallow: Able to elicit    Oral/Motor/Sensory Function Overall Oral Motor/Sensory Function: Appears within functional limits for tasks assessed   Ice Chips Ice chips: Within functional limits   Thin Liquid Thin Liquid: Within functional limits Presentation: Self Fed;Cup    Nectar Thick Nectar Thick Liquid: Not tested   Honey Thick Honey Thick Liquid: Not tested   Puree Puree: Within functional limits   Solid   Miguel Hanson, [place] CCC/SLP Pager [phone]     Solid: Within functional limits Presentation: Self Fed       Miguel Hanson 03/02/2013,11:58 AM

## 2013-03-02 NOTE — H&P (Signed)
Internal Medicine Attending Admission Note Date: 03/02/2013  Patient name: Miguel Hanson Medical record number: [id number] Date of birth: 1945/03/29 Age: 68 y.o. Gender: male  I saw and evaluated the patient. I reviewed the resident's note and I agree with the resident's findings and plan as documented in the resident's note.  Briefly, Miguel Hanson is a 68 year old man with a history of hypertension, chronic obstructive pulmonary disease, PTSD, polysubstance abuse, and seizure disorder who presents with a one-week history of ataxia and double vision. He states that in the recent past he stopped taking his phenytoin for 4 days. He then restarted it and subsequently developed the ataxia and blurred vision. He is insistent that he only takes one a phenytoin tablet a day which is 300 mg. He denies doubling up on his Dilantin despite stopping the medication for 4 days. In the emergency department he was found to have a markedly elevated phenytoin level and an MRI of the brain that was not consistent with an acute infarct. Therefore his subacute ataxia and blurry vision were felt to be secondary to Dilantin toxicity. The medications on his list do not interact significantly with Dilantin and it is unclear as to why he developed a toxic level. The Dilantin is being held and we are following his clinical examination and symptoms. This morning he denied double vision but remained quite ataxic when he was asked to get out of bed. Once his ataxia improves, we will recheck a phenytoin level to get an idea where we are with regards to the metabolism. Obviously, he will require a lower dose of phenytoin upon discharge.

## 2013-03-03 DIAGNOSIS — E43 Unspecified severe protein-calorie malnutrition: Secondary | ICD-10-CM | POA: Insufficient documentation

## 2013-03-03 LAB — GLUCOSE, CAPILLARY: GLUCOSE-CAPILLARY: 113 mg/dL — AB (ref 70–99)

## 2013-03-03 LAB — PHENYTOIN LEVEL, TOTAL: Phenytoin Lvl: 42 ug/mL (ref 10.0–20.0)

## 2013-03-03 MED ORDER — NICOTINE 21 MG/24HR TD PT24
21.0000 mg | MEDICATED_PATCH | Freq: Every day | TRANSDERMAL | Status: DC
  Filled 2013-03-03 (×2): qty 1

## 2013-03-03 MED ORDER — ENSURE COMPLETE PO LIQD
237.0000 mL | Freq: Two times a day (BID) | ORAL | Status: DC
  Administered 2013-03-04: 237 mL via ORAL

## 2013-03-03 MED ORDER — LEVETIRACETAM 500 MG PO TABS
500.0000 mg | ORAL_TABLET | Freq: Two times a day (BID) | ORAL | Status: DC
  Administered 2013-03-03 – 2013-03-04 (×3): 500 mg via ORAL
  Filled 2013-03-03 (×4): qty 1

## 2013-03-03 NOTE — Progress Notes (Signed)
Subjective: Patient feeling fine this morning, no new complaints. No vision changes.   Objective: Vital signs in last 24 hours: Filed Vitals:   03/02/13 1312 03/02/13 2100 03/03/13 0524 03/03/13 0629  BP: 129/96 134/88 133/88   Pulse: 84 78 79   Temp: 97.8 F (36.6 C) 99.1 F (37.3 C) 98.5 F (36.9 C)   TempSrc: Oral Oral Oral   Resp: 16 16 16    Height:      Weight:    52.753 kg (116 lb 4.8 oz)  SpO2: 96% 96% 96%    Weight change: 0.59 kg (1 lb 4.8 oz)  Intake/Output Summary (Last 24 hours) at 03/03/13 0951 Last data filed at 03/03/13 0900  Gross per 24 hour  Intake    480 ml  Output   1950 ml  Net  -1470 ml   Physical Exam General: thin, disheveled appearing, alert, cooperative, and in no apparent distress HEENT: NCAT, MMM Neck: supple Lungs: clear to ascultation bilaterally, normal work of respiration, no wheezes, rales, ronchi Heart: regular rate and rhythm, no murmurs, gallops, or rubs Abdomen: soft, non-tender, non-distended, normal bowel sounds  Extremities: warm, thin extremities bilaterally, no BLE edema  Skin: abrasion to tip of nose  Neurologic: alert & oriented X3, normal speech, cranial nerves II-XII intact, strength and sensation grossly intact, pt able to stand alone today and take 2-3 steps without assistance, he is somewhat unsteady on his feet though much improved since yesterday  Lab Results: Basic Metabolic Panel:  Recent Labs Lab 03/01/13 1200 03/02/13 0610  NA 135* 136*  K 4.1 4.1  CL 97 99  CO2  --  25  GLUCOSE 115* 87  BUN <3* 5*  CREATININE 0.60 0.55  CALCIUM  --  8.1*   Liver Function Tests:  Recent Labs Lab 03/02/13 0610  AST 16  ALT 11  ALKPHOS 86  BILITOT 0.4  PROT 6.3  ALBUMIN 3.1*   CBC:  Recent Labs Lab 03/01/13 1126 03/01/13 1200  WBC 6.4  --   NEUTROABS 2.0  --   HGB 13.8 15.3  HCT 36.1* 45.0  MCV 98.4  --   PLT 137*  --    CBG:  Recent Labs Lab 03/01/13 1120 03/02/13 0738 03/02/13 1151  03/02/13 1642 03/03/13 0754  GLUCAP 134* 110* 98 93 113*   Coagulation:  Recent Labs Lab 03/01/13 1126  LABPROT 13.9  INR 1.09   Urine Drug Screen: Drugs of Abuse     Component Value Date/Time   LABOPIA NONE DETECTED 03/01/2013 1726   LABOPIA NEGATIVE 12/11/2012 1207   COCAINSCRNUR NONE DETECTED 03/01/2013 1726   COCAINSCRNUR NEGATIVE 12/11/2012 1207   LABBENZ NONE DETECTED 03/01/2013 1726   LABBENZ POSITIVE* 12/11/2012 1207   AMPHETMU NONE DETECTED 03/01/2013 1726   AMPHETMU NEGATIVE 12/11/2012 1207   THCU NONE DETECTED 03/01/2013 1726   LABBARB NONE DETECTED 03/01/2013 1726    Alcohol Level:  Recent Labs Lab 03/01/13 1140  ETH <11   Urinalysis:  Recent Labs Lab 03/01/13 1726  COLORURINE YELLOW  LABSPEC 1.014  PHURINE 7.5  GLUCOSEU NEGATIVE  HGBUR NEGATIVE  BILIRUBINUR NEGATIVE  KETONESUR NEGATIVE  PROTEINUR NEGATIVE  UROBILINOGEN 0.2  NITRITE NEGATIVE  LEUKOCYTESUR NEGATIVE   Studies/Results: [name] Contrast  03/01/2013   CLINICAL DATA:  Weakness.  Unsteady gait, worsening symptoms.  EXAM: MRI HEAD WITHOUT CONTRAST  TECHNIQUE: Multiplanar, multiecho pulse sequences of the brain and surrounding structures were obtained without intravenous contrast.  COMPARISON:  CT of the head  December 11, 2012 and MRI of the head March 08, 2011.  FINDINGS: No reduced diffusion to suggest acute ischemia. No susceptibility artifact to suggest hemorrhage.  Mild ventriculomegaly, likely on the basis of global parenchymal brain volume loss as there is commensurate enlargement of cerebral sulci and cerebellar folia, unchanged from prior examination. Confluent supratentorial white matter T2 hyperintensities are seen, predominantly in the supratentorial brain common unchanged from prior examination. Thin curvilinear T2 hyperintensity in the left basal ganglia may reflect remote lacunar infarct or even artifact on this moderately motion degraded examination. No midline shift or  mass effect.  No abnormal extra-axial fluid collections. Normal major intracranial vascular flow voids observed at the skull base, with mild dolichoectasia which suggest chronic hypertension.  Fluid intensity within the left frontal sinus is new, similar partial opacification of left sphenoid sinus without paranasal sinus air-fluid levels. Mastoid air cells are well aerated.  Slightly flattened appearance of the ocular globes posteriorly bilaterally, at the level of the optic disc though, this is only well demonstrated on the axial T2 sequence. No abnormal sellar expansion. Craniocervical junction maintained. No suspicious calvarial bone marrow signal. The patient appears edentulous.  IMPRESSION: No acute intracranial process.  Motion degraded examination: Similar appearance the head with global parenchymal brain volume loss upper limits of normal. Moderate to severe white matter changes suggest chronic small vessel ischemic disease without acute component.  Paranasal sinusitis.  Apparent mild flattening of the optic discs which may be artifact due to motion though can be seen with intracranial hypertension.   Electronically Signed   By: Elon Alas   On: 03/01/2013 12:56   Medications: I have reviewed the patient's current medications. Scheduled Meds: . amLODipine  5 mg Oral Daily  . enoxaparin (LOVENOX) injection  40 mg Subcutaneous Q24H  . folic acid  1 mg Oral Daily  . lisinopril  10 mg Oral Daily  . multivitamin with minerals  1 tablet Oral Daily  . nicotine  21 mg Transdermal Daily  . sertraline  200 mg Oral Daily  . sodium chloride  3 mL Intravenous Q12H  . thiamine  100 mg Oral Daily   Continuous Infusions:  PRN Meds:.LORazepam, LORazepam  Assessment/Plan:  # Phenytoin toxicity: Gait and balance improved today. Will continue to monitor for at least one more day. I spoke to pharmacy and they recommend discontinuing phenytoin all together and starting Keppra 500mg  BID now to decrease  risk of seizure as level of dilantin decreases. Phenytoin level will not decrease linearly per pharmacist, so hard to know when to expect the level to decrease.  -hold dilantin- will not restart at discharge -Keppra 500mg  BID -bed rest  -PT/OT to eval today -phenytoin level tomorrow -regular diet  -telemetry- discontinue -neuro checks q4h   # HTN: BP stable, 130s/88.  -Continue home regimen: lisinopril 10mg  daily and amlodipine 5mg   # Alcohol abuse: No evidence of alcohol withdrawal. Pt has been monitored on CIWA. No ativan given since he was admitted.  -D/C CIWA  # Hx of seizures: Unknown last seizure date. Holding phenytoin in setting of toxicity.   # PTSD: Continue zoloft 200mg  daily   # VTE: lovenox   # Diet: regular diet   Code status: Full   Dispo: Discharge expected perhaps tomorrow, pending improvement of symptoms.  The patient does have a current PCP [facility]) and does not need an [facility] hospital follow-up appointment after discharge.  The patient does not have transportation limitations that hinder transportation to clinic  appointments.  .Services Needed at time of discharge: Y = Yes, Blank = No PT:   OT:   RN:   Equipment:   Other:     LOS: 2 days   Rebecca Eaton, MD 03/03/2013, 9:51 AM

## 2013-03-03 NOTE — Progress Notes (Signed)
INITIAL NUTRITION ASSESSMENT  DOCUMENTATION CODES Per approved criteria  -Severe malnutrition in the context of chronic illness -Underweight   INTERVENTION: - Ensure Complete BID, 8 oz provides 350 kcal, 13 g protein - Patient educated on a soft diet for home.  - Patient advised to drink [employer] at home 2-3 times daily to promote weight gain  NUTRITION DIAGNOSIS: Malnutrition related to chewing difficulty as evidenced by severe fat and muscle wasting.   Goal: Patient will meet >/=90% of estimated nutrition needs  Monitor:  PO intake, weight, labs  Reason for Assessment: Malnutrition screening tool  68 y.o. male  Admitting Dx: Dilantin toxicity  ASSESSMENT: Patient with history of PTSD, alcohol and drug abuse, admitted with dilantin toxicity. He has not been eating much over the last several years due to having only 3 teeth/difficulty chewing. He reports a 40-50 pound weight loss over an unspecified amount of time, but his weight has been consistent over the last year. He tries to eat soft foods at home, and had drank Ensure in the past, but stopped due to cost. He is currently eating well since admission.   Patient meets the criteria for severe MALNUTRITION in the context of chronic illness with PO intake <75% of estimated needs and severe fat and muscle wasting.   Nutrition Focused Physical Exam:  Subcutaneous Fat:  Orbital Region: severe wasting Upper Arm Region: severe wasting Thoracic and Lumbar Region: severe wasting  Muscle:  Temple Region: severe wasting Clavicle Bone Region: severe wasting Clavicle and Acromion Bone Region: severe wasting Scapular Bone Region: severe wasting Dorsal Hand: severe wasting Patellar Region: severe wasting Anterior Thigh Region: severe wasting Posterior Calf Region: severe wasting  Edema: WNL   Height: Ht Readings from Last 1 Encounters:  03/01/13 5\' 9"  (1.753 m)    Weight: Wt Readings from Last 1  Encounters:  03/03/13 116 lb 4.8 oz (52.753 kg)    Ideal Body Weight: 160 pounds  % Ideal Body Weight: 73%  Wt Readings from Last 10 Encounters:  03/03/13 116 lb 4.8 oz (52.753 kg)  12/12/12 116 lb 13.5 oz (53 kg)  05/15/12 111 lb (50.349 kg)  12/18/11 106 lb 3.2 oz (48.172 kg)  09/15/11 124 lb (56.246 kg)  08/11/11 124 lb 5.4 oz (56.4 kg)  07/06/11 113 lb 8.6 oz (51.5 kg)  03/08/11 128 lb 1.4 oz (58.1 kg)    Usual Body Weight: 115-120 pounds  % Usual Body Weight: 100%  BMI:  Body mass index is 17.17 kg/(m^2). Patient is underweight.    Estimated Nutritional Needs: Kcal: 1700-1800 kcal Protein: 65-75 g Fluid: >1.6 L/day  Skin: Intact  Diet Order: Dysphagia 3, thin  EDUCATION NEEDS: -Education needs addressed   Intake/Output Summary (Last 24 hours) at 03/03/13 1436 Last data filed at 03/03/13 0900  Gross per 24 hour  Intake    240 ml  Output   1950 ml  Net  -1710 ml    Last BM: PTA   Labs:   Recent Labs Lab 03/01/13 1200 03/02/13 0610  NA 135* 136*  K 4.1 4.1  CL 97 99  CO2  --  25  BUN <3* 5*  CREATININE 0.60 0.55  CALCIUM  --  8.1*  GLUCOSE 115* 87    CBG (last 3)   Recent Labs  03/02/13 1151 03/02/13 1642 03/03/13 0754  GLUCAP 98 93 113*    Scheduled Meds: . amLODipine  5 mg Oral Daily  . enoxaparin (LOVENOX) injection  40 mg Subcutaneous Q24H  .  folic acid  1 mg Oral Daily  . levETIRAcetam  500 mg Oral BID  . lisinopril  10 mg Oral Daily  . multivitamin with minerals  1 tablet Oral Daily  . nicotine  21 mg Transdermal Daily  . sertraline  200 mg Oral Daily  . sodium chloride  3 mL Intravenous Q12H  . thiamine  100 mg Oral Daily    Continuous Infusions:   Past Medical History  Diagnosis Date  . Hypertension   . Seizures   . ETOH abuse   . Drug abuse   . PTSD (post-traumatic stress disorder)   . H/O prolonged Q-T interval on ECG   . Polysubstance abuse   . Alcohol abuse   . Depression     Past Surgical History   Procedure Laterality Date  . No past surgeries      Larey Seat, RD, LDN Pager #: [place] Pager #: [phone]

## 2013-03-03 NOTE — Progress Notes (Signed)
Utilization review completed.  

## 2013-03-03 NOTE — Progress Notes (Addendum)
Pt complain of missing a black purse. Pt states that he seen purse on bedside table on Wednesday but hasn't seen it since. ED notified and have not seen purse. Room searched with no sign of purse. Patient states that he has no family members that would have taken belongings home. Security to see patient. Dorna Bloom, RN  Pt caled RN back to room. States that son took purse home with him and that purse is in the sons car. Dorna Bloom, RN

## 2013-03-03 NOTE — Evaluation (Signed)
Physical Therapy Evaluation Patient Details Name: Miguel Hanson MRN: [id number] DOB: 23-May-1945 Today's Date: 03/03/2013 Time: 6270-3500 PT Time Calculation (min): 17 min  PT Assessment / Plan / Recommendation History of Present Illness  This is a 68yo AAM w/ PMH seizure, polysubstance abuse, PTSD, COPD, HTN who presents with difficulty with balance and double vision x 1 week.  Patient with Dilantin toxicity and difficulty with ambulation.  Clinical Impression  Patient presents with problems listed below.  Will benefit from acute PT to maximize independence and safety prior to discharge home with family.  Patient with ataxic movement and decreased balance with gait.  Will need 24 hour supervision at home.  Patient's wife has MS and is in w/c.  His wife reports that her son and her sister can provide her and patient 24/7 assist at discharge.  Recommend HHPT to continue patient's therapy at discharge.    PT Assessment  Patient needs continued PT services    Follow Up Recommendations  Home health PT;Supervision/Assistance - 24 hour    Does the patient have the potential to tolerate intense rehabilitation      Barriers to Discharge Decreased caregiver support Wife has MS in w/c. Wife's sister and patient's son can provide 24 hour assist per patient's wife.    Equipment Recommendations  None recommended by PT (Reports he has a RW)    Recommendations for Other Services     Frequency Min 3X/week    Precautions / Restrictions Precautions Precautions: Fall Restrictions Weight Bearing Restrictions: No   Pertinent Vitals/Pain       Mobility  Bed Mobility Bed Mobility: Supine to Sit;Sit to Supine Supine to Sit: 5: Supervision;HOB flat Sit to Supine: 5: Supervision;HOB flat Details for Bed Mobility Assistance: Supervision for safety only Transfers Transfers: Sit to Stand;Stand to Sit Sit to Stand: 4: Min guard;From bed Stand to Sit: 4: Min guard;To bed Details for Transfer  Assistance: Assist for safety only. Ambulation/Gait Ambulation/Gait Assistance: 4: Min assist;4: Min guard Ambulation Distance (Feet): 180 Feet Assistive device: None;Rolling walker Ambulation/Gait Assistance Details: Patient ambulated 83' with no assistive device.  Patient with decreased balance, requiring assist to prevent falls.  LE movement ataxic.  Patient then ambulated 150' with RW with improved balance requiring less assist.  Verbal cues for safe use of RW. Gait Pattern: Step-through pattern;Ataxic Gait velocity: Slow    Exercises     PT Diagnosis: Difficulty walking;Abnormality of gait;Altered mental status  PT Problem List: Decreased strength;Decreased balance;Decreased mobility;Decreased cognition;Decreased coordination;Decreased knowledge of use of DME;Decreased safety awareness PT Treatment Interventions: DME instruction;Gait training;Functional mobility training;Balance training;Cognitive remediation;Patient/family education     PT Goals(Current goals can be found in the care plan section) Acute Rehab PT Goals Patient Stated Goal: To walk better. PT Goal Formulation: With patient Time For Goal Achievement: 03/10/13 Potential to Achieve Goals: Good  Visit Information  Last PT Received On: 03/03/13 Assistance Needed: +1 History of Present Illness: This is a 68yo AAM w/ PMH seizure, polysubstance abuse, PTSD, COPD, HTN who presents with difficulty with balance and double vision x 1 week.  Patient with Dilantin toxicity and difficulty with ambulation.       Prior [place] expects to be discharged to:: Private residence Living Arrangements: Spouse/significant other Available Help at Discharge: Family;Available 24 hours/day (Wife has MS - in w/c.  Her sister and son can help 24/7.) Type of Home: House Home Access: Ramped entrance Home Layout: One level Home Equipment: Walker - 2 wheels Prior Function Level  of Independence:  Independent Comments: Patient typically assists wife at home.  She also has other assist of sister and son.  Patient has been having decreased balance x 2 week.  Sister and son have been helping patient and wife for past 2 weeks. Communication Communication: No difficulties Dominant Hand: Right    Cognition  Cognition Arousal/Alertness: Awake/alert Behavior During Therapy: Impulsive;Anxious Overall Cognitive Status: Impaired/Different from baseline Area of Impairment: Orientation;Memory;Safety/judgement;Problem solving Orientation Level: Disoriented to;Time Memory: Decreased short-term memory Safety/Judgement: Decreased awareness of safety Problem Solving: Slow processing;Difficulty sequencing;Requires verbal cues;Requires tactile cues General Comments: "They told me you were coming yesterday"  Order was written today.  Difficulty following some motor commands.  Patient reports he feels that he can help his wife when he goes home, though we just discussed his decreased balance and safety.    Extremity/Trunk Assessment Upper Extremity Assessment Upper Extremity Assessment: Overall WFL for tasks assessed (Lt slightly weaker than Rt) Lower Extremity Assessment Lower Extremity Assessment: RLE deficits/detail;LLE deficits/detail RLE Deficits / Details: Strength is good at 5/5.  Patient with ataxic movements and decreased coordination. RLE Coordination: decreased gross motor LLE Deficits / Details: Strength at 4/5 - slightly weaker than Rt.  Ataxic movements and decreased coordination. LLE Coordination: decreased gross motor Cervical / Trunk Assessment Cervical / Trunk Assessment: Normal   Balance Balance Balance Assessed: Yes Static Standing Balance Static Standing - Balance Support: No upper extremity supported Static Standing - Level of Assistance: 5: Stand by assistance Static Standing - Comment/# of Minutes: 1 min with good balance Dynamic Standing Balance Dynamic Standing - Balance  Support: No upper extremity supported Dynamic Standing - Level of Assistance: 4: Min assist Dynamic Standing - Comments: During functional mobility, patient with decreased dynamic balance, requiring assist to prevent falls.  End of Session PT - End of Session Equipment Utilized During Treatment: Gait belt Activity Tolerance: Patient tolerated treatment well Patient left: in bed;with call bell/phone within reach Nurse Communication: Mobility status  GP     Despina Pole 03/03/2013, 7:12 PM Carita Pian. Sanjuana Kava, [place] Pager [phone]

## 2013-03-04 LAB — GLUCOSE, CAPILLARY: GLUCOSE-CAPILLARY: 101 mg/dL — AB (ref 70–99)

## 2013-03-04 MED ORDER — LISINOPRIL 10 MG PO TABS: 10.0000 mg | ORAL_TABLET | Freq: Every day | ORAL | Status: DC

## 2013-03-04 MED ORDER — LEVETIRACETAM 500 MG PO TABS: 500.0000 mg | ORAL_TABLET | Freq: Two times a day (BID) | ORAL | Status: DC

## 2013-03-04 MED ORDER — ENSURE COMPLETE PO LIQD: 237.0000 mL | Freq: Two times a day (BID) | ORAL | Status: DC

## 2013-03-04 NOTE — Progress Notes (Signed)
Subjective: [name] was seen and examined at bedside this morning and he is eager to go home. He explains that he is walking much better now and feels much better since admission. No more double vision, he has a walker at home, and that his son can help provide 24 hour supervision for him at home. CM has arranged home health needs for him. We discussed discontinuation of Dilantin and his transition to [place] which he understands. He will follow up in [facility]. Otherwise, he denies any fever, chills, chest pain, N/V/D, or abdominal pain at this time.   Objective: Vital signs in last 24 hours: Filed Vitals:   03/04/13 0000 03/04/13 0400 03/04/13 0913 03/04/13 1321  BP: 108/79 102/67 106/70 108/69  Pulse: 92 78 82 87  Temp: 98.1 F (36.7 C) 98.1 F (36.7 C) 98.2 F (36.8 C) 98.4 F (36.9 C)  TempSrc:   Oral Oral  Resp: 20 18 18 18   Height:      Weight:      SpO2: 91% 96% 94% 95%   Weight change:   Intake/Output Summary (Last 24 hours) at 03/04/13 1330 Last data filed at 03/04/13 0600  Gross per 24 hour  Intake    260 ml  Output   1625 ml  Net  -1365 ml   Physical Exam Vitals reviewed. General: thin, resting in bed, NAD HEENT: PERRL, EOMI, left eye horizontal nystagmus noted on rightward gaze Cardiac: RRR Pulm: clear to auscultation bilaterally, no wheezes, rales, or rhonchi Abd: soft, nontender, nondistended, BS present Ext: warm and well perfused, no pedal edema, moving all 4 extremities Neuro: alert and oriented X3, cranial nerves II-XII grossly intact, strength and sensation to light touch equal in bilateral upper and lower extremities, able to stand by himself, a little unstable but keeps balance and did not fall back. Able to stand with eyes closed as well and walk around the corner of the bed without support. He says his gait is almost back to baseline.   Lab Results: Basic Metabolic Panel:  Recent Labs Lab 03/01/13 1200 03/02/13 0610  NA 135* 136*  K 4.1 4.1  CL  97 99  CO2  --  25  GLUCOSE 115* 87  BUN <3* 5*  CREATININE 0.60 0.55  CALCIUM  --  8.1*   Liver Function Tests:  Recent Labs Lab 03/02/13 0610  AST 16  ALT 11  ALKPHOS 86  BILITOT 0.4  PROT 6.3  ALBUMIN 3.1*   CBC:  Recent Labs Lab 03/01/13 1126 03/01/13 1200  WBC 6.4  --   NEUTROABS 2.0  --   HGB 13.8 15.3  HCT 36.1* 45.0  MCV 98.4  --   PLT 137*  --    CBG:  Recent Labs Lab 03/01/13 1120 03/02/13 0738 03/02/13 1151 03/02/13 1642 03/03/13 0754 03/04/13 0742  GLUCAP 134* 110* 98 93 113* 101*   Coagulation:  Recent Labs Lab 03/01/13 1126  LABPROT 13.9  INR 1.09   Urine Drug Screen: Drugs of Abuse     Component Value Date/Time   LABOPIA NONE DETECTED 03/01/2013 1726   LABOPIA NEGATIVE 12/11/2012 [place] DETECTED 03/01/2013 [place] 12/11/2012 1207   LABBENZ NONE DETECTED 03/01/2013 1726   LABBENZ POSITIVE* 12/11/2012 1207   AMPHETMU NONE DETECTED 03/01/2013 1726   AMPHETMU NEGATIVE 12/11/2012 1207   THCU NONE DETECTED 03/01/2013 1726   LABBARB NONE DETECTED 03/01/2013 1726    Alcohol Level:  Recent Labs Lab 03/01/13  [place] <11   Urinalysis:  Recent Labs Lab 03/01/13 1726  COLORURINE YELLOW  LABSPEC 1.014  PHURINE 7.5  GLUCOSEU NEGATIVE  HGBUR NEGATIVE  BILIRUBINUR NEGATIVE  KETONESUR NEGATIVE  PROTEINUR NEGATIVE  UROBILINOGEN 0.2  NITRITE NEGATIVE  LEUKOCYTESUR NEGATIVE   Medications: I have reviewed the patient's current medications. Scheduled Meds: . amLODipine  5 mg Oral Daily  . enoxaparin (LOVENOX) injection  40 mg Subcutaneous Q24H  . feeding supplement (ENSURE COMPLETE)  237 mL Oral BID BM  . folic acid  1 mg Oral Daily  . levETIRAcetam  500 mg Oral BID  . lisinopril  10 mg Oral Daily  . multivitamin with minerals  1 tablet Oral Daily  . nicotine  21 mg Transdermal Daily  . sertraline  200 mg Oral Daily  . sodium chloride  3 mL Intravenous Q12H   Assessment/Plan: Mr.  Miguel Hanson is a 68 year old male with seizure disorder admitted for Dilantin toxicity.   Phenytoin toxicity: Resolving with cessation of Phenytoin on admission, was on for seizure disorder. Gait and balance much improved today.  Phenytoin level trending down (58.4-->42).   -Continue Keppra 500mg  BID on discharge -PT recommends home health with 24 hour supervision -tolerating regular diet    HTN: Stable -Continue home regimen: lisinopril 10mg  daily and amlodipine 5mg   PTSD: Continue zoloft 200mg  daily   VTE: lovenox  Diet: regular diet  Code status: Full   Dispo: d/c home today  The patient does have a current PCP [facility]) and does not need an [facility] hospital follow-up appointment after discharge, however can follow up with [place] in the meantime if not able to be seen by [facility] in time.   The patient does not have transportation limitations that hinder transportation to clinic appointments.  .Services Needed at time of discharge: Y = Yes, Blank = No PT: Home health, 24 hour supervision  OT:   RN:   Equipment: Has rolling walker at home  Other:     LOS: 3 days   Jerene Pitch, MD 03/04/2013, 1:30 PM

## 2013-03-04 NOTE — Progress Notes (Signed)
Dr. Gordy Levan (On Call) notified of Dilantin level 42. No new orders.  Will continue to monitor.

## 2013-03-04 NOTE — Progress Notes (Signed)
Physical Therapy Treatment Patient Details Name: Miguel Hanson MRN: [id number] DOB: 07-24-1945 Today's Date: 03/04/2013 Time: 7628-3151 PT Time Calculation (min): 24 min  PT Assessment / Plan / Recommendation  History of Present Illness This is a 68yo AAM w/ PMH seizure, polysubstance abuse, PTSD, COPD, HTN who presents with difficulty with balance and double vision x 1 week.  Patient with Dilantin toxicity and difficulty with ambulation.   PT Comments   Patient with slight improvement in balance.  Continued to reinforce use of RW at all times when OOB for balance and safety.  Patient agrees with HHPT follow-up.  Son confirms that patient has RW at home.   Follow Up Recommendations  Home health PT;Supervision/Assistance - 24 hour     Does the patient have the potential to tolerate intense rehabilitation     Barriers to Discharge        Equipment Recommendations  None recommended by PT    Recommendations for Other Services    Frequency Min 3X/week   Progress towards PT Goals Progress towards PT goals: Progressing toward goals  Plan Current plan remains appropriate    Precautions / Restrictions Precautions Precautions: Fall Restrictions Weight Bearing Restrictions: No   Pertinent Vitals/Pain     Mobility  Bed Mobility Bed Mobility: Supine to Sit;Sit to Supine Supine to Sit: 5: Supervision;HOB flat Sit to Supine: 5: Supervision;HOB flat Details for Bed Mobility Assistance: Supervision for safety only Transfers Transfers: Sit to Stand;Stand to Sit Sit to Stand: 4: Min guard;From bed Stand to Sit: 4: Min guard;To bed Details for Transfer Assistance: Assist for safety only. Ambulation/Gait Ambulation/Gait Assistance: 4: Min guard Ambulation Distance (Feet): 220 Feet Assistive device: Rolling walker Ambulation/Gait Assistance Details: Verbal cues for safe use of RW particularly during turns.  Continued to note ataxic movement, with LLE foot dragging x2.  Assist for balance  during turns - encouraged small steps during turns. Gait Pattern: Step-through pattern;Ataxic;Decreased dorsiflexion - left Gait velocity: Slow    Exercises Other Exercises Other Exercises: Bridging x10 in supine Other Exercises: Half-bridging using LLE x10 in supine     PT Goals (current goals can now be found in the care plan section)    Visit Information  Last PT Received On: 03/04/13 Assistance Needed: +1 History of Present Illness: This is a 68yo AAM w/ PMH seizure, polysubstance abuse, PTSD, COPD, HTN who presents with difficulty with balance and double vision x 1 week.  Patient with Dilantin toxicity and difficulty with ambulation.    Subjective Data  Subjective: "I feel better today"   Cognition  Cognition Arousal/Alertness: Awake/alert Behavior During Therapy: Impulsive;Anxious Overall Cognitive Status: No family/caregiver present to determine baseline cognitive functioning Area of Impairment: Memory;Following commands;Safety/judgement;Problem solving Memory: Decreased short-term memory Following Commands: Follows one step commands with increased time Safety/Judgement: Decreased awareness of safety;Decreased awareness of deficits Problem Solving: Slow processing;Difficulty sequencing;Requires verbal cues;Requires tactile cues    Balance     End of Session PT - End of Session Equipment Utilized During Treatment: Gait belt Activity Tolerance: Patient tolerated treatment well Patient left: in bed;with call bell/phone within reach;with family/visitor present Nurse Communication: Mobility status (Need for HHPT)   GP     Despina Pole 03/04/2013, 11:04 AM Carita Pian. Sanjuana Kava, [place] Pager [phone]

## 2013-03-04 NOTE — Discharge Summary (Signed)
Name: Miguel Hanson MRN: [id number] DOB: November 08, 1945 68 y.o. PCP: [facility]  Date of Admission: 03/01/2013 11:01 AM Date of Discharge: 03/04/2013 Attending Physician: Dr. Eppie Gibson Discharge Diagnosis:  Principal Problem:   Dilantin toxicity Active Problems:   Seizure   Alcohol abuse   Hypertension   Protein-calorie malnutrition, severe  Discharge Medications:   Medication List    STOP taking these medications       phenytoin 300 MG ER capsule  Commonly known as:  DILANTIN      TAKE these medications       amLODipine 5 MG tablet  Commonly known as:  NORVASC  Take 5 mg by mouth daily.     feeding supplement (ENSURE COMPLETE) Liqd  Take 237 mLs by mouth 2 (two) times daily between meals.     levETIRAcetam 500 MG tablet  Commonly known as:  KEPPRA  Take 1 tablet (500 mg total) by mouth 2 (two) times daily.     lisinopril 10 MG tablet  Commonly known as:  PRINIVIL,ZESTRIL  Take 1 tablet (10 mg total) by mouth daily.     sertraline 100 MG tablet  Commonly known as:  ZOLOFT  Take 200 mg by mouth daily.       Disposition and follow-up:   MiguelAdisa Hanson was discharged from [facility] in stable condition.  At the hospital follow up visit please address:  Dilantin toxicity: resolution of ataxia and nystagmus. Compliance with Keppra. Any more seizure episodes? Dilantin level 42 on day prior to discharge. Home health PT with 24 hour supervision (with son).   Alcohol abuse: strongly encouraged cessation during hospital course.  Severe malnutrition: 116lbs on discharge. Recommended to drink Carnation instant breakfast q2-3 times a day at home.   2.  Labs / imaging needed at time of follow-up: none  3.  Pending labs/ test needing follow-up: none  Follow-up Appointments:     Follow-up Information   Follow up with Fransisca Kaufmann, MD On 03/10/2013. (10:45am)    Specialty:  Internal Medicine   Contact information:   [place] [address] [phone]       Follow up with [facility]. (home health physical therapy)    Contact information:   [address] [place] [address] [phone]      Discharge Instructions: Discharge Orders   Future Appointments Provider Department Dept Phone   03/10/2013 10:45 AM Othella Boyer, MD [place] [phone]   Future Orders Complete By Expires   Call MD for:  persistant dizziness or light-headedness  As directed    Call MD for:  As directed    Comments:     Worsening vision or walking   Diet - low sodium heart healthy  As directed    Increase activity slowly  As directed      Procedures Performed:  [name] Contrast  03/01/2013   CLINICAL DATA:  Weakness.  Unsteady gait, worsening symptoms.  EXAM: MRI HEAD WITHOUT CONTRAST  TECHNIQUE: Multiplanar, multiecho pulse sequences of the brain and surrounding structures were obtained without intravenous contrast.  COMPARISON:  CT of the head December 11, 2012 and MRI of the head March 08, 2011.  FINDINGS: No reduced diffusion to suggest acute ischemia. No susceptibility artifact to suggest hemorrhage.  Mild ventriculomegaly, likely on the basis of global parenchymal brain volume loss as there is commensurate enlargement of cerebral sulci and cerebellar folia, unchanged from prior  examination. Confluent supratentorial white matter T2 hyperintensities are seen, predominantly in the supratentorial brain common unchanged from prior examination. Thin curvilinear T2 hyperintensity in the left basal ganglia may reflect remote lacunar infarct or even artifact on this moderately motion degraded examination. No midline shift or mass effect.  No abnormal extra-axial fluid collections. Normal major intracranial vascular flow voids observed at the skull base, with mild dolichoectasia which suggest chronic hypertension.  Fluid intensity within the left frontal sinus is new,  similar partial opacification of left sphenoid sinus without paranasal sinus air-fluid levels. Mastoid air cells are well aerated.  Slightly flattened appearance of the ocular globes posteriorly bilaterally, at the level of the optic disc though, this is only well demonstrated on the axial T2 sequence. No abnormal sellar expansion. Craniocervical junction maintained. No suspicious calvarial bone marrow signal. The patient appears edentulous.  IMPRESSION: No acute intracranial process.  Motion degraded examination: Similar appearance the head with global parenchymal brain volume loss upper limits of normal. Moderate to severe white matter changes suggest chronic small vessel ischemic disease without acute component.  Paranasal sinusitis.  Apparent mild flattening of the optic discs which may be artifact due to motion though can be seen with intracranial hypertension.   Electronically Signed   By: Elon Alas   On: 03/01/2013 12:56   Admission HPI: This is a 68yo AAM w/ PMH seizure, polysubstance abuse, PTSD, COPD, HTN who presents with difficulty with balance and double vision x 1 week.  Of note, patient is a poor historian. Patient notes that last Wednesday he noticed he was having trouble walking and became unsteady on his feet, endorses falling yesterday and scraping his nose but no other falls. Patient endorsed having double vision over this past week, but denied having any double or blurry vision at the time of the interview. At time of our interview, patient reports that his symptoms had resolved, though when we got him out of bed he was clearly unsteady. Pt last took his dilantin this morning, he usually takes this at night, but forgot last night. He cannot recall when his last seizure was. Denies CP, SOB, dizziness, focal weakness or altered sensation, abd pain, N/V/D, difficulty speaking. Pt lives with his wife at home.   Hospital Course by problem list: Principal Problem:   Dilantin  toxicity Active Problems:   Seizure   Alcohol abuse   Hypertension   Protein-calorie malnutrition, severe   Dilantin toxicity--was on Dilantin for seizure disorder but non-compliant.  Admitted for ataxia and blurry vision.  Dilantin level on admission 58.4 down to 42 day prior to admission with improvement of symptoms. Dilantin was discontinued on admission and converted to [place] that will be continued on discharge as well.  Adherence to Keppra is strongly encouraged.  Mr. Blythe was evaluated by PT and recommended for home health PT with 24 hour supervision that was arranged on discharge.  His son will help provide the 24 hour supervision.  At time of discharge, Mr. Hashagen was more stable when standing and able to have improved gait without support but still slightly unstable.  He also had a left eye horizontal nystagmus present on day of discharge but resolution of blurry vision did occur shortly after admission.  He noted that he was almost back to his baseline. He does have a rolling walker at home that he is encouraged to use at all times. There was no seizure activity during hospital course. Mr. Maitre is recommended to follow up with PCP.  Alcohol abuse: no signs of withdrawal during hospital course. Reports drinking a pint nightly.  Was initially placed on CIWA protocol but did not require any Ativan during admission.  Cessation strongly advised.   Hypertension: stable during hospital course.  Continued on Norvasc and Lisinopril during admission and on discharge. BP 108/60 on discharge.   Severe protein calorie malnutrition: started on Ensure complete BID during admission and recommended to follow a soft diet (Dysphagia 3--mechanical soft) at home along with drinking Carnation instant breakfast at least 2-3 times a day to promote weight gain.  Weight on discharge 116lbs.   Discharge Vitals:   BP 108/69  Pulse 87  Temp(Src) 98.4 F (36.9 C) (Oral)  Resp 18  Ht 5\' 9"  (1.753 m)  Wt 116  lb 4.8 oz (52.753 kg)  BMI 17.17 kg/m2  SpO2 95%  Discharge Labs:  Results for orders placed during the hospital encounter of 03/01/13 (from the past 24 hour(s))  GLUCOSE, CAPILLARY     Status: Abnormal   Collection Time    03/04/13  7:42 AM      Result Value Range   Glucose-Capillary 101 (*) 70 - 99 mg/dL   Signed: Jerene Pitch, MD 03/04/2013, 10:58 PM   Time Spent on Discharge: 40 minutes Services Ordered on Discharge: home health PT, 24 hour supervision Equipment Ordered on Discharge: has rolling walker at home already

## 2013-03-04 NOTE — Discharge Instructions (Signed)
Please STOP taking Dilantin!!  Please continue taking Keppra for your seizures  Please follow up with the [facility] right away for hospital follow up and in the meantime we have made an appointment for you in our clinic if you cannot get to the [facility]  Please have 24 hour supervision with you at home at all times and work with home health PT and use [facility]  If you notice worsening of your vision or walking ability or if you have any chest pain, nausea, vomiting, diarrhea, fever, or chills go to ED or call PCP right away  Phenytoin Toxicity Phenytoin is a medicine that is used to help control many seizure problems. Phenytoin toxicity means the amount of phenytoin in the blood is high enough to cause problems. Your caregiver will carefully and frequently monitor the amount of phenytoin in the blood to make sure that just the right dose is given. How you feel and your blood test information help guide your caregiver in making sure that a specific dose is best for you.  CAUSES  Changes in other prescription medicines can directly influence the amount of phenytoin in the blood, even though the amount of phenytoin taken remains unchanged. The following medicines may raise the level of phenytoin in the blood:   Amiodarone.  Chlordiazepoxide.  Diazepam.  Dicumarol (blood thinner).  Disulfiram.  Estrogens (hormone replacement).  Aspirin and medicines containing salicylates.  Sulfonamides.  Tolbutamide (used for diabetes management).  Famotidine (used for ulcer disease).  Isoniazid (antibiotic).  Methylphenidate (used for attention deficit disorders).  Phenothiazines (used for nausea).  Trazedone (used for depression and sleep problems).  Primidone.  Fluconazole (used for yeast infections).  Valproic acid (used for seizures). Patients taking many medicines for a variety of ailments may accidentally take too much phenytoin. A change in metabolism or heavy, binge  drinking of alcohol can also raise the level of phenytoin. In rare cases, problems develop when phenytoin is given directly into a vein, and it is given too fast. Emergency department and hospital staff giving phenytoin this way avoid this problem by the use of intravenous (IV) pumps and careful monitoring. SYMPTOMS  In mild cases, symptoms may not be pronounced. They may develop slowly over weeks or months. They can include:   Poor balance or weakness when walking.  Slow movement with all activity.  Tremors or shaking.  Irritability.  Confusion.  Loss of control of urine.  Trouble speaking or swallowing.  Double vision.  Tender and swollen gums.  Unusual pains in the arms or legs.  Abnormal hair growth.  Nausea or vomiting.  Problems with functioning of the thyroid gland. In severe cases there may be:  Disorientation.  Severe confusion.  Coma.  Uncontrolled movement of arms or legs.  Trouble breathing.  Yellowing of the skin or eyes (jaundice).  Skin rashes.  Swelling of the face.  Abdominal pain with possible enlargement of the liver or spleen. In some cases, too much phenytoin is taken intentionally in a suicide attempt. This can be very serious and requires emergency care and hospitalization. DIAGNOSIS  Your caregiver will ask for several blood tests, such as:  A blood count.  Kidney or liver tests.  Levels of glucose (sugar) and normal salts in the blood.  Phenytoin and other medicine levels.  Pregnancy test, when appropriate. Other tests may also be needed such as:  Urine test (urinalysis).  A test that records the electrical activity of the heart (electrocardiography, EKG).  X-rays.  If you are experiencing confusion or disorientation, a computerized X-ray scan (CT or CAT scan) of the head may be needed.  TREATMENT  In mild cases, the phenytoin is stopped for a period of time as directed by your caregiver. The time period might be as short  as only 1 or 2 days and will be followed by careful checking of symptoms and repeat blood tests. A review of all medicines (prescription, nonprescription, and herbal) should be done at this time. In more severe cases, hospitalization is mandatory and may include monitoring in an intensive care unit. If there is severe disorientation or coma, or if problems are found in the liver or heart, help is obtained from medical specialists. HOME CARE INSTRUCTIONS   Be careful when taking your daily medicines. It is very important to ask for help if you need it. The same problems can develop again if medicine dosages change.  Follow through with blood tests and office visits as directed by your caregiver. SEEK MEDICAL CARE IF:   You are confused about how to take your medicines.  You plan to adjust dosages of any of your prescription or nonprescription medicines. This may affect the level of phenytoin in your blood.  You develop any of the "mild case" symptoms described above.  You have episodes of falling or have problems keeping your balance. SEEK IMMEDIATE MEDICAL CARE IF:   You fall and are injured.  You develop any of the "severe case" symptoms described above. MAKE SURE YOU:   Understand these instructions.  Will watch your condition.  Will get help right away if you are not doing well or get worse. Document Released: 06/18/2006 Document Revised: 05/11/2011 Document Reviewed: 02/04/2007 [facility] Patient Information 2014 [place], [place].

## 2013-03-04 NOTE — Progress Notes (Signed)
   CARE MANAGEMENT NOTE 03/04/2013  Patient:  NNAEMEKA, SAMSON   Account Number:  [id number]  Date Initiated:  03/04/2013  Documentation initiated by:  [facility]  Subjective/Objective Assessment:   adm:  ataxia and blurry vision     Action/Plan:   discharge plan   Anticipated DC Date:  03/04/2013   Anticipated DC Plan:  [place]  CM consult      [facility] Choice  HOME HEALTH   Choice offered to / List presented to:  C-1 Patient        [place] arranged  [place] [facility]      [facility].   Status of service:  Completed, signed off Medicare Important Message given?   (If response is "NO", the following Medicare IM given date fields will be blank) Date Medicare IM given:   Date Additional Medicare IM given:    Discharge Disposition:  [place]  Per UR Regulation:    If discussed at Long Length of Stay Meetings, dates discussed:    Comments:  03/04/13 09:45 CM spoke with pt in room to offer choice.  Pt chooses [facility] for HHPT.  Address and contact number was verified.  No need for DME.  Pt states he has a walker at home.  Referral faxed to [facility].  No other CM needs were communicated.  Mariane Masters, BSN, CM [phone].

## 2013-03-10 ENCOUNTER — Encounter: Payer: Medicare Other | Admitting: Internal Medicine

## 2013-03-10 NOTE — Progress Notes (Signed)
This encounter was created in error - please disregard.

## 2013-11-24 ENCOUNTER — Emergency Department (HOSPITAL_COMMUNITY): Payer: Medicare Other

## 2013-11-24 ENCOUNTER — Emergency Department (HOSPITAL_COMMUNITY)
Admission: EM | Admit: 2013-11-24 | Discharge: 2013-11-24 | Disposition: A | Discharge status: Home / Self Care | Payer: Medicare Other | Attending: Emergency Medicine | Admitting: Emergency Medicine

## 2013-11-24 ENCOUNTER — Encounter (HOSPITAL_COMMUNITY): Payer: Self-pay | Admitting: Emergency Medicine

## 2013-11-24 DIAGNOSIS — I10 Essential (primary) hypertension: Secondary | ICD-10-CM | POA: Diagnosis not present

## 2013-11-24 DIAGNOSIS — R4182 Altered mental status, unspecified: Secondary | ICD-10-CM | POA: Diagnosis not present

## 2013-11-24 DIAGNOSIS — F3289 Other specified depressive episodes: Secondary | ICD-10-CM | POA: Insufficient documentation

## 2013-11-24 DIAGNOSIS — G40909 Epilepsy, unspecified, not intractable, without status epilepticus: Secondary | ICD-10-CM | POA: Insufficient documentation

## 2013-11-24 DIAGNOSIS — F1210 Cannabis abuse, uncomplicated: Secondary | ICD-10-CM | POA: Insufficient documentation

## 2013-11-24 DIAGNOSIS — F4310 Post-traumatic stress disorder, unspecified: Secondary | ICD-10-CM | POA: Diagnosis not present

## 2013-11-24 DIAGNOSIS — F17200 Nicotine dependence, unspecified, uncomplicated: Secondary | ICD-10-CM | POA: Diagnosis not present

## 2013-11-24 DIAGNOSIS — R569 Unspecified convulsions: Secondary | ICD-10-CM

## 2013-11-24 DIAGNOSIS — F329 Major depressive disorder, single episode, unspecified: Secondary | ICD-10-CM | POA: Insufficient documentation

## 2013-11-24 DIAGNOSIS — Z79899 Other long term (current) drug therapy: Secondary | ICD-10-CM | POA: Insufficient documentation

## 2013-11-24 LAB — DIFFERENTIAL
Basophils Absolute: 0 10*3/uL (ref 0.0–0.1)
Basophils Relative: 1 % (ref 0–1)
EOS ABS: 0.3 10*3/uL (ref 0.0–0.7)
Eosinophils Relative: 5 % (ref 0–5)
Lymphocytes Relative: 39 % (ref 12–46)
Lymphs Abs: 2 10*3/uL (ref 0.7–4.0)
MONO ABS: 0.6 10*3/uL (ref 0.1–1.0)
MONOS PCT: 11 % (ref 3–12)
NEUTROS ABS: 2.3 10*3/uL (ref 1.7–7.7)
Neutrophils Relative %: 45 % (ref 43–77)

## 2013-11-24 LAB — URINALYSIS, ROUTINE W REFLEX MICROSCOPIC
Bilirubin Urine: NEGATIVE
Glucose, UA: NEGATIVE mg/dL
Hgb urine dipstick: NEGATIVE
Ketones, ur: NEGATIVE mg/dL
LEUKOCYTES UA: NEGATIVE
NITRITE: NEGATIVE
Protein, ur: 30 mg/dL — AB
SPECIFIC GRAVITY, URINE: 1.014 (ref 1.005–1.030)
UROBILINOGEN UA: 0.2 mg/dL (ref 0.0–1.0)
pH: 6 (ref 5.0–8.0)

## 2013-11-24 LAB — I-STAT TROPONIN, ED: Troponin i, poc: 0.01 ng/mL (ref 0.00–0.08)

## 2013-11-24 LAB — COMPREHENSIVE METABOLIC PANEL
ALT: 33 U/L (ref 0–53)
ANION GAP: 17 — AB (ref 5–15)
AST: 152 U/L — ABNORMAL HIGH (ref 0–37)
Albumin: 4.2 g/dL (ref 3.5–5.2)
Alkaline Phosphatase: 89 U/L (ref 39–117)
BILIRUBIN TOTAL: 0.8 mg/dL (ref 0.3–1.2)
BUN: 6 mg/dL (ref 6–23)
CHLORIDE: 97 meq/L (ref 96–112)
CO2: 22 mEq/L (ref 19–32)
CREATININE: 0.5 mg/dL (ref 0.50–1.35)
Calcium: 8.9 mg/dL (ref 8.4–10.5)
GFR calc non Af Amer: >90 mL/min (ref >90)
Glucose, Bld: 120 mg/dL — ABNORMAL HIGH (ref 70–99)
Potassium: 3.8 mEq/L (ref 3.7–5.3)
Sodium: 136 mEq/L — ABNORMAL LOW (ref 137–147)
Total Protein: 7.8 g/dL (ref 6.0–8.3)

## 2013-11-24 LAB — CBC
HEMATOCRIT: 34.2 % — AB (ref 39.0–52.0)
HEMOGLOBIN: 12.8 g/dL — AB (ref 13.0–17.0)
MCH: 38.7 pg — AB (ref 26.0–34.0)
MCHC: 37.4 g/dL — AB (ref 30.0–36.0)
MCV: 103.3 fL — ABNORMAL HIGH (ref 78.0–100.0)
Platelets: 94 10*3/uL — ABNORMAL LOW (ref 150–400)
RBC: 3.31 MIL/uL — ABNORMAL LOW (ref 4.22–5.81)
RDW: 16.2 % — ABNORMAL HIGH (ref 11.5–15.5)
WBC: 5.1 10*3/uL (ref 4.0–10.5)

## 2013-11-24 LAB — URINE MICROSCOPIC-ADD ON

## 2013-11-24 LAB — PROTIME-INR
INR: 1.13 (ref 0.00–1.49)
Prothrombin Time: 14.5 seconds (ref 11.6–15.2)

## 2013-11-24 LAB — RAPID URINE DRUG SCREEN, HOSP PERFORMED
Amphetamines: NOT DETECTED
BARBITURATES: NOT DETECTED
Benzodiazepines: NOT DETECTED
Cocaine: NOT DETECTED
Opiates: NOT DETECTED
Tetrahydrocannabinol: POSITIVE — AB

## 2013-11-24 LAB — APTT: aPTT: 31 seconds (ref 24–37)

## 2013-11-24 MED ORDER — LEVETIRACETAM IN NACL 1000 MG/100ML IV SOLN
1000.0000 mg | Freq: Once | INTRAVENOUS | Status: AC
  Administered 2013-11-24: 1000 mg via INTRAVENOUS
  Filled 2013-11-24: qty 100

## 2013-11-24 MED ORDER — LISINOPRIL 10 MG PO TABS
10.0000 mg | ORAL_TABLET | Freq: Once | ORAL | Status: AC
  Administered 2013-11-24: 10 mg via ORAL
  Filled 2013-11-24: qty 1

## 2013-11-24 NOTE — ED Notes (Signed)
Pt decrease in mental status since initial arrival. Pt is currently unaware of his name, location, time, situation. MD aware.

## 2013-11-24 NOTE — ED Provider Notes (Signed)
CSN: [id number]     Arrival date & time 11/24/13  0453 History   First MD Initiated Contact with Patient 11/24/13 0502     Chief Complaint  Patient presents with  . Seizures     (Consider location/radiation/quality/duration/timing/severity/associated sxs/prior Treatment) HPI Comments: Per EMS pt comes from home where he had seizure during the night. Pt's seizure woke family members. Unknown how long seizure lasted. Pt slightly post-itcal. Pt last seizure was a year ago and pt has been compliant with medications. Pt drinks alcohol and has cut back recently. Pt was incont of bowel during seizure.  Patient is a 68 y.o. male presenting with seizures. The history is provided by the patient. The history is limited by the condition of the patient and the absence of a caregiver. No language interpreter was used.  Seizures Seizure activity on arrival: no   Seizure type:  Unable to specify Initial focality:  Unable to specify Episode characteristics: generalized shaking   Postictal symptoms: confusion   Postictal symptoms comment:  Agitation Return to baseline: no   Duration: unknown. Timing:  Unable to specify Progression:  Unable to specify History of seizures: yes     Past Medical History  Diagnosis Date  . Hypertension   . Seizures   . ETOH abuse   . Drug abuse   . PTSD (post-traumatic stress disorder)   . H/O prolonged Q-T interval on ECG   . Polysubstance abuse   . Alcohol abuse   . Depression    Past Surgical History  Procedure Laterality Date  . No past surgeries     History reviewed. No pertinent family history. History  Substance Use Topics  . Smoking status: Current Some Day Smoker -- 0.50 packs/day for 49 years    Types: Cigarettes  . Smokeless tobacco: Never Used  . Alcohol Use: 21.6 oz/week    36 Shots of liquor per week     Comment: 03/01/2013 "drink a pint of brandy q 2 days"    Review of Systems  Unable to perform ROS: Mental status change  Neurological:  Positive for seizures.      Allergies  Review of patient's allergies indicates no known allergies.  Home Medications   Prior to Admission medications   Medication Sig Start Date End Date Taking? Authorizing Provider  acetaminophen (TYLENOL) 500 MG tablet Take 1,000 mg by mouth every 6 (six) hours as needed for mild pain.   Yes Historical Provider, MD  amLODipine (NORVASC) 5 MG tablet Take 5 mg by mouth daily.   Yes Historical Provider, MD  feeding supplement, ENSURE COMPLETE, (ENSURE COMPLETE) LIQD Take 237 mLs by mouth 2 (two) times daily between meals. 03/04/13  Yes Wilber Oliphant, MD  levETIRAcetam (KEPPRA) 500 MG tablet Take 1 tablet (500 mg total) by mouth 2 (two) times daily. 03/04/13  Yes Wilber Oliphant, MD  lisinopril (PRINIVIL,ZESTRIL) 10 MG tablet Take 1 tablet (10 mg total) by mouth daily. 03/04/13  Yes Wilber Oliphant, MD  sertraline (ZOLOFT) 100 MG tablet Take 200 mg by mouth daily. 07/07/11  Yes Robbie Lis, MD   BP 157/95  Pulse 90  Temp(Src) 98.1 F (36.7 C) (Oral)  Resp 14  SpO2 100% Physical Exam  Constitutional: He appears well-developed and well-nourished. No distress.  HENT:  Head: Normocephalic and atraumatic.  Mouth/Throat: No oropharyngeal exudate.  Eyes: Pupils are equal, round, and reactive to light.  Neck: Normal range of motion. Neck supple.  Cardiovascular: Normal rate, regular rhythm and normal  heart sounds.  Exam reveals no gallop and no friction rub.   No murmur heard. Pulmonary/Chest: Effort normal and breath sounds normal. No respiratory distress. He has no wheezes. He has no rales.  Abdominal: Soft. Bowel sounds are normal. He exhibits no distension and no mass. There is no tenderness. There is no rebound and no guarding.  Musculoskeletal: Normal range of motion. He exhibits no edema and no tenderness.  Neurological: He is alert. GCS eye subscore is 4. GCS verbal subscore is 3. GCS motor subscore is 6.  Speech is inappropriate, repetitive, at  times nonsensical. He cannot follow motor commands, but is moving all extremities equally. No UE drift.   Skin: Skin is warm and dry.  Psychiatric: He has a normal mood and affect.    ED Course  Procedures (including critical care time) Labs Review Labs Reviewed  CBC - Abnormal; Notable for the following:    RBC 3.31 (*)    Hemoglobin 12.8 (*)    HCT 34.2 (*)    MCV 103.3 (*)    MCH 38.7 (*)    MCHC 37.4 (*)    RDW 16.2 (*)    Platelets 94 (*)    All other components within normal limits  COMPREHENSIVE METABOLIC PANEL - Abnormal; Notable for the following:    Sodium 136 (*)    Glucose, Bld 120 (*)    AST 152 (*)    Anion gap 17 (*)    All other components within normal limits  URINE RAPID DRUG SCREEN (HOSP PERFORMED) - Abnormal; Notable for the following:    Tetrahydrocannabinol POSITIVE (*)    All other components within normal limits  URINALYSIS, ROUTINE W REFLEX MICROSCOPIC - Abnormal; Notable for the following:    APPearance CLOUDY (*)    Protein, ur 30 (*)    All other components within normal limits  URINE MICROSCOPIC-ADD ON - Abnormal; Notable for the following:    Bacteria, UA MANY (*)    All other components within normal limits  PROTIME-INR  APTT  DIFFERENTIAL  I-STAT TROPOININ, ED    Imaging Review Ct Head Wo Contrast  11/24/2013   CLINICAL DATA:  Seizure this morning.  Altered mental status.  EXAM: CT HEAD WITHOUT CONTRAST  TECHNIQUE: Contiguous axial images were obtained from the base of the skull through the vertex without intravenous contrast.  COMPARISON:  Brain MRI, 03/01/2013 and head CT, 12/11/2012.  FINDINGS: Ventricles are normal in size, for this patient's age, and normal in configuration. There are no parenchymal masses or mass effect. There is no evidence of an infarct. White matter hypoattenuation is noted consistent with mild chronic microvascular ischemic change.  There are no extra-axial masses or abnormal fluid collections.  No intracranial  hemorrhage.  Opacified left frontal sinus. Remaining visualized sinuses are clear. Clear mastoid air cells.  IMPRESSION: 1. No acute intracranial abnormalities. Age related volume loss and mild chronic microvascular ischemic change. 2. Chronic opacification of the left frontal sinus.   Electronically Signed   By: Lajean Manes [name]   On: 11/24/2013 07:18     EKG Interpretation None      MDM   Final diagnoses:  Seizure    Pt is a 68 y.o. male with Pmhx as above who presents with altered mental status and a procedure note. Per EMS family is not sure what time he started seizing but they were woken up by him seizing in his bed. He has been compliant with medications and takes Keppra for seizures.  Patient had been incontinent of stool. Upon arrival to the ED he was cleaned by staff and they report that he was alert somewhat confused but able to give birth date and full name. On my exam short time later later patient's speech is garbled and nonsensical. He is moving all extremities equally but has   difficulty following commands to do full neuro exam. We'll get stat CT head.  Spoke w/ neurology who also rec 1g keppra load, likely post-ictal state. We agree that pt does not meet code CVA criteria, but is symptoms persist, will plan on MRI.    0800 PM pt's mental status much improved, is oriented to person, place, and date except for year. He cannot correctly repeat a phrase. He has no focal neuro findings on repeat neuro exam. Will continue to monitor. As pt is rapidly improving, suspect he will be safe to d/c shortly. Doubt CVA/TIA and feel symptoms c/w post-ictal state. Pt tells me he hadn't taken his meds for about 1 week because he didn't feel like it. He has been drinking less than usual this week. Care transferred to Dr. Stevie Kern who will reexamine.   Ernestina Patches, MD 11/24/13 2027

## 2013-11-24 NOTE — Discharge Instructions (Signed)
Seizure, Adult °A seizure is abnormal electrical activity in the brain. Seizures usually last from 30 seconds to 2 minutes. There are various types of seizures. °Before a seizure, you may have a warning sensation (aura) that a seizure is about to occur. An aura may include the following symptoms:  °· Fear or anxiety. °· Nausea. °· Feeling like the room is spinning (vertigo). °· Vision changes, such as seeing flashing lights or spots. °Common symptoms during a seizure include: °· A change in attention or behavior (altered mental status). °· Convulsions with rhythmic jerking movements. °· Drooling. °· Rapid eye movements. °· Grunting. °· Loss of bladder and bowel control. °· Bitter taste in the mouth. °· Tongue biting. °After a seizure, you may feel confused and sleepy. You may also have an injury resulting from convulsions during the seizure. °HOME CARE INSTRUCTIONS  °· If you are given medicines, take them exactly as prescribed by your health care provider. °· Keep all follow-up appointments as directed by your health care provider. °· Do not swim or drive or engage in risky activity during which a seizure could cause further injury to you or others until your health care provider says it is OK. °· Get adequate rest. °· Teach friends and family what to do if you have a seizure. They should: °· Lay you on the ground to prevent a fall. °· Put a cushion under your head. °· Loosen any tight clothing around your neck. °· Turn you on your side. If vomiting occurs, this helps keep your airway clear. °· Stay with you until you recover. °· Know whether or not you need emergency care. °SEEK IMMEDIATE MEDICAL CARE IF: °· The seizure lasts longer than 5 minutes. °· The seizure is severe or you do not wake up immediately after the seizure. °· You have an altered mental status after the seizure. °· You are having more frequent or worsening seizures. °Someone should drive you to the emergency department or call local emergency  services (911 in [place] °MAKE SURE YOU: °· Understand these instructions. °· Will watch your condition. °· Will get help right away if you are not doing well or get worse. °Document Released: 02/14/2000 Document Revised: 12/07/2012 Document Reviewed: 09/28/2012 °[employer]® Patient Information ©2015 [employer]. This information is not intended to replace advice given to you by your health care provider. Make sure you discuss any questions you have with your health care provider. ° °Driving and Equipment Restrictions °Some medical problems make it dangerous to drive, ride a bike, or use machines. Some of these problems are: °· A hard blow to the head (concussion). °· Passing out (fainting). °· Twitching and shaking (seizures). °· Low blood sugar. °· Taking medicine to help you relax (sedatives). °· Taking pain medicines. °· Wearing an eye patch. °· Wearing splints. This can make it hard to use parts of your body that you need to drive safely. °HOME CARE  °· Do not drive until your doctor says it is okay. °· Do not use machines until your doctor says it is okay. °You may need a form signed by your doctor (medical release) before you can drive again. You may also need this form before you do other tasks where you need to be fully alert. °MAKE SURE YOU: °· Understand these instructions. °· Will watch your condition. °· Will get help right away if you are not doing well or get worse. °Document Released: 03/26/2004 Document Revised: 05/11/2011 Document Reviewed: 06/26/2009 °[employer]® Patient Information ©2015 [employer].   This information is not intended to replace advice given to you by your health care provider. Make sure you discuss any questions you have with your health care provider.  Epilepsy People with epilepsy have times when they shake and jerk uncontrollably (seizures). This happens when there is a sudden change in brain function. Epilepsy may have many possible causes. Anything that disturbs the normal  pattern of brain cell activity can lead to seizures. HOME CARE   Follow your doctor's instructions about driving and safety during normal activities.  Get enough sleep.  Only take medicine as told by your doctor.  Avoid things that you know can cause you to have seizures (triggers).  Write down when your seizures happen and what you remember about each seizure. Write down anything you think may have caused the seizure to happen.  Tell the people you live and work with that you have seizures. Make sure they know how to help you. They should:  Cushion your head and body.  Turn you on your side.  Not restrain you.  Not place anything inside your mouth.  Call for local emergency medical help if there is any question about what has happened.  Keep all follow-up visits with your doctor. This is very important. GET HELP IF:  You get an infection or start to feel sick. You may have more seizures when you are sick.  You are having seizures more often.  Your seizure pattern is changing. GET HELP RIGHT AWAY IF:   A seizure does not stop after a few seconds or minutes.  A seizure causes you to have trouble breathing.  A seizure gives you a very bad headache.  A seizure makes you unable to speak or use a part of your body. Document Released: 12/14/2008 Document Revised: 12/07/2012 Document Reviewed: 09/28/2012 [facility] Patient Information 2015 [place], [place]. This information is not intended to replace advice given to you by your health care provider. Make sure you discuss any questions you have with your health care provider.

## 2013-11-24 NOTE — ED Notes (Addendum)
Per EMS pt comes from home where he had seizure during the night. Pt's seizure woke family members. Unknown how long seizure lasted. Pt slightly post-itcal. Pt last seizure was a year ago and pt has been compliant with medications. Pt drinks alcohol and has cut back recently. Pt was incont of bowel during seizure.

## 2013-11-24 NOTE — ED Provider Notes (Signed)
Patient awake alert calm cooperative oriented to person place and time is not interested in alcohol detox denies any threats or himself or others denies suicidal or homicidal ideation or hallucinations states he is ready to call his wife arrived home and states he is feeling back to baseline. [id number]  Babette Relic, MD 11/29/13 [phone]

## 2013-11-24 NOTE — ED Notes (Signed)
Reported BP to EDP.  EDP will order daily home med for BP.

## 2013-11-24 NOTE — ED Notes (Signed)
Bed: WA09 Expected date:  Expected time:  Means of arrival:  Comments: EMS 68yo M seizure

## 2013-11-24 NOTE — ED Notes (Signed)
Pt is able to state name and DOB.

## 2013-12-17 ENCOUNTER — Emergency Department (HOSPITAL_COMMUNITY)
Admission: EM | Admit: 2013-12-17 | Discharge: 2013-12-17 | Disposition: A | Discharge status: Home / Self Care | Payer: Medicare Other | Attending: Emergency Medicine | Admitting: Emergency Medicine

## 2013-12-17 ENCOUNTER — Encounter (HOSPITAL_COMMUNITY): Payer: Self-pay | Admitting: Emergency Medicine

## 2013-12-17 DIAGNOSIS — R569 Unspecified convulsions: Secondary | ICD-10-CM | POA: Diagnosis present

## 2013-12-17 DIAGNOSIS — I10 Essential (primary) hypertension: Secondary | ICD-10-CM | POA: Insufficient documentation

## 2013-12-17 DIAGNOSIS — Z79899 Other long term (current) drug therapy: Secondary | ICD-10-CM | POA: Diagnosis not present

## 2013-12-17 DIAGNOSIS — G40909 Epilepsy, unspecified, not intractable, without status epilepticus: Secondary | ICD-10-CM | POA: Diagnosis not present

## 2013-12-17 DIAGNOSIS — Z720 Tobacco use: Secondary | ICD-10-CM | POA: Diagnosis not present

## 2013-12-17 DIAGNOSIS — F329 Major depressive disorder, single episode, unspecified: Secondary | ICD-10-CM | POA: Diagnosis not present

## 2013-12-17 DIAGNOSIS — N390 Urinary tract infection, site not specified: Secondary | ICD-10-CM

## 2013-12-17 LAB — URINALYSIS, ROUTINE W REFLEX MICROSCOPIC
BILIRUBIN URINE: NEGATIVE
Glucose, UA: 100 mg/dL — AB
KETONES UR: NEGATIVE mg/dL
LEUKOCYTES UA: NEGATIVE
NITRITE: POSITIVE — AB
PROTEIN: 30 mg/dL — AB
Specific Gravity, Urine: 1.02 (ref 1.005–1.030)
UROBILINOGEN UA: 0.2 mg/dL (ref 0.0–1.0)
pH: 7.5 (ref 5.0–8.0)

## 2013-12-17 LAB — URINE MICROSCOPIC-ADD ON

## 2013-12-17 MED ORDER — CEPHALEXIN 250 MG PO CAPS
500.0000 mg | ORAL_CAPSULE | Freq: Once | ORAL | Status: AC
  Administered 2013-12-17: 500 mg via ORAL
  Filled 2013-12-17: qty 2

## 2013-12-17 MED ORDER — LEVETIRACETAM 500 MG PO TABS: 500.0000 mg | ORAL_TABLET | Freq: Two times a day (BID) | ORAL | Status: DC

## 2013-12-17 MED ORDER — SODIUM CHLORIDE 0.9 % IV BOLUS (SEPSIS): 1000.0000 mL | Freq: Once | INTRAVENOUS | Status: DC

## 2013-12-17 MED ORDER — CEPHALEXIN 500 MG PO CAPS: 500.0000 mg | ORAL_CAPSULE | Freq: Four times a day (QID) | ORAL | Status: DC

## 2013-12-17 MED ORDER — ACETAMINOPHEN 325 MG PO TABS
650.0000 mg | ORAL_TABLET | Freq: Once | ORAL | Status: AC
  Administered 2013-12-17: 650 mg via ORAL
  Filled 2013-12-17: qty 2

## 2013-12-17 MED ORDER — LEVETIRACETAM IN NACL 1000 MG/100ML IV SOLN
1000.0000 mg | Freq: Once | INTRAVENOUS | Status: AC
  Administered 2013-12-17: 1000 mg via INTRAVENOUS
  Filled 2013-12-17: qty 100

## 2013-12-17 MED ORDER — LORAZEPAM 2 MG/ML IJ SOLN
1.0000 mg | Freq: Once | INTRAMUSCULAR | Status: AC
  Administered 2013-12-17: 1 mg via INTRAVENOUS
  Filled 2013-12-17: qty 1

## 2013-12-17 NOTE — Discharge Instructions (Signed)

## 2013-12-17 NOTE — ED Provider Notes (Addendum)
CSN: [id number]     Arrival date & time 12/17/13  0549 History   First MD Initiated Contact with Patient 12/17/13 [phone]     Chief Complaint  Patient presents with  . Seizures     The history is provided by the patient, medical records and a relative.   is reported the patient has a history of seizures and had a seizure this morning.  I spoke with the nephew who lives with him at home.  Next reports he is intermittently compliant with his Keppra.  He is a daily drinker and he has not noticed any changes in strength in habits.  No reports of fevers or chills.  No reports of vomiting.  No change in history sent oral intake.  Patient has no significant complaints at this time but does seem a little postictal   Past Medical History  Diagnosis Date  . Hypertension   . Seizures   . ETOH abuse   . Drug abuse   . PTSD (post-traumatic stress disorder)   . H/O prolonged Q-T interval on ECG   . Polysubstance abuse   . Alcohol abuse   . Depression    Past Surgical History  Procedure Laterality Date  . No past surgeries     History reviewed. No pertinent family history. History  Substance Use Topics  . Smoking status: Current Some Day Smoker -- 0.50 packs/day for 49 years    Types: Cigarettes  . Smokeless tobacco: Current User  . Alcohol Use: 21.6 oz/week    36 Shots of liquor per week     Comment: 03/01/2013 "drink a pint of brandy q 2 days"    Review of Systems  Neurological: Positive for seizures.  All other systems reviewed and are negative.     Allergies  Review of patient's allergies indicates no known allergies.  Home Medications   Prior to Admission medications   Medication Sig Start Date End Date Taking? Authorizing Provider  acetaminophen (TYLENOL) 500 MG tablet Take 1,000 mg by mouth every 6 (six) hours as needed for mild pain.    Historical Provider, MD  amLODipine (NORVASC) 5 MG tablet Take 5 mg by mouth daily.    Historical Provider, MD  feeding supplement,  ENSURE COMPLETE, (ENSURE COMPLETE) LIQD Take 237 mLs by mouth 2 (two) times daily between meals. 03/04/13   Wilber Oliphant, MD  levETIRAcetam (KEPPRA) 500 MG tablet Take 1 tablet (500 mg total) by mouth 2 (two) times daily. 03/04/13   Wilber Oliphant, MD  lisinopril (PRINIVIL,ZESTRIL) 10 MG tablet Take 1 tablet (10 mg total) by mouth daily. 03/04/13   Wilber Oliphant, MD  sertraline (ZOLOFT) 100 MG tablet Take 200 mg by mouth daily. 07/07/11   Robbie Lis, MD   BP 157/104  Pulse 99  Temp(Src) 98.2 F (36.8 C) (Oral)  Resp 14  Ht 5\' 8"  (1.727 m)  Wt 116 lb (52.617 kg)  BMI 17.64 kg/m2  SpO2 98% Physical Exam  Nursing note and vitals reviewed. Constitutional: He is oriented to person, place, and time. He appears well-developed and well-nourished.  HENT:  Head: Normocephalic and atraumatic.  Eyes: EOM are normal. Pupils are equal, round, and reactive to light.  Neck: Normal range of motion.  Cardiovascular: Normal rate, regular rhythm, normal heart sounds and intact distal pulses.   Pulmonary/Chest: Effort normal and breath sounds normal. No respiratory distress.  Abdominal: Soft. He exhibits no distension. There is no tenderness.  Musculoskeletal: Normal range of motion.  Neurological: He is alert and oriented to person, place, and time.  5/5 strength in major muscle groups of  bilateral upper and lower extremities. Speech normal. No facial asymetry.   Skin: Skin is warm and dry.  Psychiatric: He has a normal mood and affect. Judgment normal.    ED Course  Procedures (including critical care time) Labs Review Labs Reviewed - No data to display  Imaging Review No results found.   EKG Interpretation   Date/Time:  Sunday December 17 2013 06:09:04 EDT Ventricular Rate:  95 PR Interval:  199 QRS Duration: 86 QT Interval:  389 QTC Calculation: 489 R Axis:   56 Text Interpretation:  Sinus rhythm Borderline prolonged QT interval  Baseline wander in lead(s) V2 No significant  change was found Confirmed by  Harmon Bommarito  MD, Makiyah Zentz ([id number]) on 12/17/2013 6:16:01 AM      MDM   Final diagnoses:  Seizures    Suspect breakthrough seizure.  Doubt alcohol withdrawal seizure.  No recent head trauma per the nephew.  No recent illness.  Suspect this is secondary to noncompliance with his Keppra.  Patient be low with 1 g IV Keppra at this time.  1 mg IV Ativan given so he does not have a second seizure here in the emergency department.    Hoy Morn, MD 12/17/13 [place], MD 12/17/13 [phone]

## 2013-12-17 NOTE — ED Notes (Signed)
Spoke to family member (nephew) on the phone and states patient sounds normal to him.    Updated Dr. Maryan Rued on the phone call.

## 2013-12-17 NOTE — ED Provider Notes (Signed)
Prior to discharge patient noted to have a heart rate of 120 and a temperature of 100.8. Patient had lab work done within the last 3 weeks that was at the normal limits however he has had multiple episodes of urinating here. We'll check a UA and at this time is nitrite positive with many bacteria and few white blood cells. Will start patient on an antibiotic prior to discharge  Blanchie Dessert, MD 12/17/13 [phone]

## 2013-12-17 NOTE — ED Notes (Signed)
Pt taken home by family.  Pt was ambulatory to the waiting room with this RN with steady ambulation.

## 2013-12-17 NOTE — ED Notes (Signed)
Spoke to wife on the home phone provided and pt spouse to call sister to come get patient.

## 2013-12-17 NOTE — ED Notes (Addendum)
Pt brought by [facility] from home after report from family pt having a seizure while asleep that last for 2 min, no injury noticed, pt is confuse and combative at times, pt incontinent for BM and urine. Very wet on arrival to ED. CBG completed by EMS 179. Pt follows command, but refuse to answer any questions during assessment. Pt has a Hx of ETOH abuse and no complaint with seizure medication.

## 2014-02-25 ENCOUNTER — Emergency Department (HOSPITAL_COMMUNITY): Payer: Medicare Other

## 2014-02-25 ENCOUNTER — Encounter (HOSPITAL_COMMUNITY): Payer: Self-pay | Admitting: *Deleted

## 2014-02-25 ENCOUNTER — Inpatient Hospital Stay (HOSPITAL_COMMUNITY): Payer: Medicare Other

## 2014-02-25 ENCOUNTER — Inpatient Hospital Stay (HOSPITAL_COMMUNITY)
Admission: EM | Admit: 2014-02-25 | Discharge: 2014-02-27 | DRG: 441 | Disposition: A | Discharge status: Home / Self Care | Payer: Medicare Other | Attending: Internal Medicine | Admitting: Internal Medicine

## 2014-02-25 DIAGNOSIS — K7290 Hepatic failure, unspecified without coma: Secondary | ICD-10-CM | POA: Diagnosis present

## 2014-02-25 DIAGNOSIS — G92 Toxic encephalopathy: Secondary | ICD-10-CM | POA: Diagnosis present

## 2014-02-25 DIAGNOSIS — I10 Essential (primary) hypertension: Secondary | ICD-10-CM | POA: Diagnosis not present

## 2014-02-25 DIAGNOSIS — Z79899 Other long term (current) drug therapy: Secondary | ICD-10-CM

## 2014-02-25 DIAGNOSIS — F17210 Nicotine dependence, cigarettes, uncomplicated: Secondary | ICD-10-CM | POA: Diagnosis present

## 2014-02-25 DIAGNOSIS — R4182 Altered mental status, unspecified: Secondary | ICD-10-CM | POA: Diagnosis not present

## 2014-02-25 DIAGNOSIS — Z9114 Patient's other noncompliance with medication regimen: Secondary | ICD-10-CM | POA: Diagnosis present

## 2014-02-25 DIAGNOSIS — E46 Unspecified protein-calorie malnutrition: Secondary | ICD-10-CM | POA: Diagnosis present

## 2014-02-25 DIAGNOSIS — E876 Hypokalemia: Secondary | ICD-10-CM | POA: Insufficient documentation

## 2014-02-25 DIAGNOSIS — F4310 Post-traumatic stress disorder, unspecified: Secondary | ICD-10-CM | POA: Diagnosis not present

## 2014-02-25 DIAGNOSIS — F10239 Alcohol dependence with withdrawal, unspecified: Secondary | ICD-10-CM | POA: Diagnosis not present

## 2014-02-25 DIAGNOSIS — Z23 Encounter for immunization: Secondary | ICD-10-CM

## 2014-02-25 DIAGNOSIS — Z681 Body mass index (BMI) 19 or less, adult: Secondary | ICD-10-CM | POA: Diagnosis not present

## 2014-02-25 DIAGNOSIS — J449 Chronic obstructive pulmonary disease, unspecified: Secondary | ICD-10-CM | POA: Diagnosis not present

## 2014-02-25 DIAGNOSIS — D696 Thrombocytopenia, unspecified: Secondary | ICD-10-CM | POA: Diagnosis present

## 2014-02-25 DIAGNOSIS — G928 Other toxic encephalopathy: Secondary | ICD-10-CM | POA: Insufficient documentation

## 2014-02-25 DIAGNOSIS — G40909 Epilepsy, unspecified, not intractable, without status epilepticus: Secondary | ICD-10-CM

## 2014-02-25 DIAGNOSIS — R531 Weakness: Secondary | ICD-10-CM

## 2014-02-25 DIAGNOSIS — F329 Major depressive disorder, single episode, unspecified: Secondary | ICD-10-CM | POA: Diagnosis present

## 2014-02-25 DIAGNOSIS — Y901 Blood alcohol level of 20-39 mg/100 ml: Secondary | ICD-10-CM | POA: Diagnosis present

## 2014-02-25 DIAGNOSIS — F1010 Alcohol abuse, uncomplicated: Secondary | ICD-10-CM | POA: Diagnosis present

## 2014-02-25 DIAGNOSIS — R569 Unspecified convulsions: Secondary | ICD-10-CM

## 2014-02-25 DIAGNOSIS — R9431 Abnormal electrocardiogram [ECG] [EKG]: Secondary | ICD-10-CM | POA: Insufficient documentation

## 2014-02-25 DIAGNOSIS — G4089 Other seizures: Secondary | ICD-10-CM | POA: Diagnosis not present

## 2014-02-25 DIAGNOSIS — K7682 Hepatic encephalopathy: Secondary | ICD-10-CM | POA: Diagnosis present

## 2014-02-25 LAB — COMPREHENSIVE METABOLIC PANEL
ALBUMIN: 4.4 g/dL (ref 3.5–5.2)
ALT: 15 U/L (ref 0–53)
ANION GAP: 20 — AB (ref 5–15)
AST: 71 U/L — ABNORMAL HIGH (ref 0–37)
Alkaline Phosphatase: 100 U/L (ref 39–117)
BILIRUBIN TOTAL: 0.8 mg/dL (ref 0.3–1.2)
BUN: 5 mg/dL — AB (ref 6–23)
CHLORIDE: 104 meq/L (ref 96–112)
CO2: 20 mmol/L (ref 19–32)
CREATININE: 0.54 mg/dL (ref 0.50–1.35)
Calcium: 9.8 mg/dL (ref 8.4–10.5)
GFR calc Af Amer: >90 mL/min (ref >90)
Glucose, Bld: 113 mg/dL — ABNORMAL HIGH (ref 70–99)
Potassium: 4.4 mmol/L (ref 3.5–5.1)
Sodium: 144 mmol/L (ref 135–145)
Total Protein: 8.3 g/dL (ref 6.0–8.3)

## 2014-02-25 LAB — CBG MONITORING, ED: Glucose-Capillary: 144 mg/dL — ABNORMAL HIGH (ref 70–99)

## 2014-02-25 LAB — RAPID URINE DRUG SCREEN, HOSP PERFORMED
Amphetamines: NOT DETECTED
BARBITURATES: NOT DETECTED
Benzodiazepines: NOT DETECTED
COCAINE: NOT DETECTED
OPIATES: NOT DETECTED
Tetrahydrocannabinol: NOT DETECTED

## 2014-02-25 LAB — CBC WITH DIFFERENTIAL/PLATELET
Basophils Absolute: 0.1 10*3/uL (ref 0.0–0.1)
Basophils Relative: 1 % (ref 0–1)
Eosinophils Absolute: 0.2 10*3/uL (ref 0.0–0.7)
Eosinophils Relative: 3 % (ref 0–5)
HEMATOCRIT: 36.6 % — AB (ref 39.0–52.0)
HEMOGLOBIN: 13 g/dL (ref 13.0–17.0)
Lymphocytes Relative: 54 % — ABNORMAL HIGH (ref 12–46)
Lymphs Abs: 4.8 10*3/uL — ABNORMAL HIGH (ref 0.7–4.0)
MCH: 39.9 pg — AB (ref 26.0–34.0)
MCHC: 35.5 g/dL (ref 30.0–36.0)
MCV: 112.3 fL — AB (ref 78.0–100.0)
MONO ABS: 0.6 10*3/uL (ref 0.1–1.0)
MONOS PCT: 7 % (ref 3–12)
Neutro Abs: 3.1 10*3/uL (ref 1.7–7.7)
Neutrophils Relative %: 35 % — ABNORMAL LOW (ref 43–77)
Platelets: 126 10*3/uL — ABNORMAL LOW (ref 150–400)
RBC: 3.26 MIL/uL — ABNORMAL LOW (ref 4.22–5.81)
RDW: 18.6 % — AB (ref 11.5–15.5)
WBC: 8.8 10*3/uL (ref 4.0–10.5)

## 2014-02-25 LAB — URINALYSIS, ROUTINE W REFLEX MICROSCOPIC
BILIRUBIN URINE: NEGATIVE
Glucose, UA: NEGATIVE mg/dL
Ketones, ur: NEGATIVE mg/dL
Leukocytes, UA: NEGATIVE
NITRITE: NEGATIVE
PROTEIN: 100 mg/dL — AB
SPECIFIC GRAVITY, URINE: 1.011 (ref 1.005–1.030)
UROBILINOGEN UA: 0.2 mg/dL (ref 0.0–1.0)
pH: 6.5 (ref 5.0–8.0)

## 2014-02-25 LAB — MAGNESIUM: Magnesium: 1.3 mg/dL — ABNORMAL LOW (ref 1.5–2.5)

## 2014-02-25 LAB — URINE MICROSCOPIC-ADD ON

## 2014-02-25 LAB — ETHANOL: ALCOHOL ETHYL (B): 26 mg/dL — AB (ref 0–9)

## 2014-02-25 LAB — MRSA PCR SCREENING: MRSA by PCR: NEGATIVE

## 2014-02-25 LAB — PHENYTOIN LEVEL, TOTAL: Phenytoin Lvl: <2.5 ug/mL — ABNORMAL LOW (ref 10.0–20.0)

## 2014-02-25 LAB — TROPONIN I: Troponin I: <0.03 ng/mL (ref <0.031)

## 2014-02-25 LAB — AMMONIA: AMMONIA: 224 umol/L — AB (ref 11–32)

## 2014-02-25 MED ORDER — LORAZEPAM 2 MG/ML IJ SOLN: 0.0000 mg | Freq: Two times a day (BID) | INTRAMUSCULAR | Status: DC

## 2014-02-25 MED ORDER — LISINOPRIL 10 MG PO TABS: 10.0000 mg | ORAL_TABLET | Freq: Every day | ORAL | Status: DC

## 2014-02-25 MED ORDER — SERTRALINE HCL 100 MG PO TABS
200.0000 mg | ORAL_TABLET | Freq: Every day | ORAL | Status: DC
  Administered 2014-02-26 – 2014-02-27 (×2): 200 mg via ORAL
  Filled 2014-02-25 (×2): qty 2

## 2014-02-25 MED ORDER — HEPARIN SODIUM (PORCINE) 5000 UNIT/ML IJ SOLN
5000.0000 [IU] | Freq: Three times a day (TID) | INTRAMUSCULAR | Status: DC
  Administered 2014-02-25 – 2014-02-26 (×3): 5000 [IU] via SUBCUTANEOUS
  Filled 2014-02-25 (×5): qty 1

## 2014-02-25 MED ORDER — THIAMINE HCL 100 MG/ML IJ SOLN
Freq: Once | INTRAVENOUS | Status: AC
  Administered 2014-02-25: 15:00:00 via INTRAVENOUS
  Filled 2014-02-25: qty 1000

## 2014-02-25 MED ORDER — ONDANSETRON HCL 4 MG/2ML IJ SOLN
4.0000 mg | Freq: Four times a day (QID) | INTRAMUSCULAR | Status: DC | PRN
  Administered 2014-02-26: 4 mg via INTRAVENOUS
  Filled 2014-02-25: qty 2

## 2014-02-25 MED ORDER — THIAMINE HCL 100 MG/ML IJ SOLN
100.0000 mg | Freq: Every day | INTRAMUSCULAR | Status: DC
  Filled 2014-02-25: qty 1

## 2014-02-25 MED ORDER — LORAZEPAM 1 MG PO TABS: 1.0000 mg | ORAL_TABLET | Freq: Four times a day (QID) | ORAL | Status: DC | PRN

## 2014-02-25 MED ORDER — LORAZEPAM 2 MG/ML IJ SOLN
1.0000 mg | Freq: Four times a day (QID) | INTRAMUSCULAR | Status: DC | PRN
  Administered 2014-02-26: 1 mg via INTRAVENOUS
  Filled 2014-02-25 (×3): qty 1

## 2014-02-25 MED ORDER — LORAZEPAM 2 MG/ML IJ SOLN
INTRAMUSCULAR | Status: AC
  Administered 2014-02-25: 1 mg
  Filled 2014-02-25: qty 1

## 2014-02-25 MED ORDER — OXYCODONE HCL 5 MG PO TABS
5.0000 mg | ORAL_TABLET | ORAL | Status: DC | PRN
  Administered 2014-02-26: 5 mg via ORAL
  Filled 2014-02-25: qty 1

## 2014-02-25 MED ORDER — LORAZEPAM 2 MG/ML IJ SOLN
0.0000 mg | Freq: Four times a day (QID) | INTRAMUSCULAR | Status: AC
  Administered 2014-02-26: 2 mg via INTRAVENOUS
  Administered 2014-02-26: 1 mg via INTRAVENOUS
  Administered 2014-02-26 – 2014-02-27 (×2): 2 mg via INTRAVENOUS
  Filled 2014-02-25 (×2): qty 1

## 2014-02-25 MED ORDER — SODIUM CHLORIDE 0.9 % IV SOLN
INTRAVENOUS | Status: DC
  Administered 2014-02-26 (×2): via INTRAVENOUS

## 2014-02-25 MED ORDER — FOLIC ACID 1 MG PO TABS
1.0000 mg | ORAL_TABLET | Freq: Every day | ORAL | Status: DC
  Administered 2014-02-26 – 2014-02-27 (×2): 1 mg via ORAL
  Filled 2014-02-25 (×2): qty 1

## 2014-02-25 MED ORDER — LISINOPRIL 10 MG PO TABS
10.0000 mg | ORAL_TABLET | Freq: Every day | ORAL | Status: DC
  Administered 2014-02-25 – 2014-02-27 (×3): 10 mg via ORAL
  Filled 2014-02-25 (×3): qty 1

## 2014-02-25 MED ORDER — ACETAMINOPHEN 325 MG PO TABS: 650.0000 mg | ORAL_TABLET | Freq: Four times a day (QID) | ORAL | Status: DC | PRN

## 2014-02-25 MED ORDER — LEVETIRACETAM IN NACL 500 MG/100ML IV SOLN: 500.0000 mg | Freq: Once | INTRAVENOUS | Status: DC

## 2014-02-25 MED ORDER — LACTULOSE 10 GM/15ML PO SOLN
30.0000 g | Freq: Three times a day (TID) | ORAL | Status: DC
  Administered 2014-02-25 – 2014-02-27 (×6): 30 g via ORAL
  Filled 2014-02-25 (×6): qty 45

## 2014-02-25 MED ORDER — VITAMIN B-1 100 MG PO TABS
100.0000 mg | ORAL_TABLET | Freq: Every day | ORAL | Status: DC
  Administered 2014-02-26 – 2014-02-27 (×2): 100 mg via ORAL
  Filled 2014-02-25 (×2): qty 1

## 2014-02-25 MED ORDER — SODIUM CHLORIDE 0.9 % IV SOLN
INTRAVENOUS | Status: DC
  Administered 2014-02-25: 10:00:00 via INTRAVENOUS

## 2014-02-25 MED ORDER — LEVETIRACETAM IN NACL 1500 MG/100ML IV SOLN
1500.0000 mg | INTRAVENOUS | Status: AC
  Administered 2014-02-25: 1500 mg via INTRAVENOUS
  Filled 2014-02-25: qty 100

## 2014-02-25 MED ORDER — ADULT MULTIVITAMIN W/MINERALS CH
1.0000 | ORAL_TABLET | Freq: Every day | ORAL | Status: DC
  Administered 2014-02-26 – 2014-02-27 (×2): 1 via ORAL
  Filled 2014-02-25 (×2): qty 1

## 2014-02-25 MED ORDER — LACTULOSE 10 GM/15ML PO SOLN
30.0000 g | Freq: Once | ORAL | Status: DC
  Filled 2014-02-25: qty 45

## 2014-02-25 MED ORDER — ACETAMINOPHEN 650 MG RE SUPP: 650.0000 mg | Freq: Four times a day (QID) | RECTAL | Status: DC | PRN

## 2014-02-25 MED ORDER — LEVETIRACETAM IN NACL 1000 MG/100ML IV SOLN
1000.0000 mg | Freq: Two times a day (BID) | INTRAVENOUS | Status: DC
  Administered 2014-02-25 – 2014-02-27 (×4): 1000 mg via INTRAVENOUS
  Filled 2014-02-25 (×7): qty 100

## 2014-02-25 MED ORDER — AMLODIPINE BESYLATE 5 MG PO TABS
5.0000 mg | ORAL_TABLET | Freq: Every day | ORAL | Status: DC
  Administered 2014-02-26 – 2014-02-27 (×2): 5 mg via ORAL
  Filled 2014-02-25 (×2): qty 1

## 2014-02-25 MED ORDER — SODIUM CHLORIDE 0.9 % IJ SOLN
3.0000 mL | Freq: Two times a day (BID) | INTRAMUSCULAR | Status: DC
  Administered 2014-02-25 – 2014-02-27 (×4): 3 mL via INTRAVENOUS

## 2014-02-25 MED ORDER — ONDANSETRON HCL 4 MG PO TABS: 4.0000 mg | ORAL_TABLET | Freq: Four times a day (QID) | ORAL | Status: DC | PRN

## 2014-02-25 NOTE — ED Provider Notes (Signed)
CSN: [id number]     Arrival date & time 02/25/14  0865 History   First MD Initiated Contact with Patient 02/25/14 0919     Chief Complaint  Patient presents with  . Code Stroke     (Consider location/radiation/quality/duration/timing/severity/associated sxs/prior Treatment) HPI Comments: Patient here for home with decreased ability to talk as well as altered mental status which occurred suddenly at approximately 8 AM. EMS was called and patient's CBG above 100. Code stroke was initiated and on arrival to the ER patient started to have a generalized tonic-clonic seizure. He does have a history of seizure disorder for which he takes Keppra. No further history obtainable at this time  The history is provided by the EMS personnel. The history is limited by the condition of the patient.    Past Medical History  Diagnosis Date  . Hypertension   . Seizures   . ETOH abuse   . Drug abuse   . PTSD (post-traumatic stress disorder)   . H/O prolonged Q-T interval on ECG   . Polysubstance abuse   . Alcohol abuse   . Depression    Past Surgical History  Procedure Laterality Date  . No past surgeries     No family history on file. History  Substance Use Topics  . Smoking status: Current Some Day Smoker -- 0.50 packs/day for 49 years    Types: Cigarettes  . Smokeless tobacco: Current User  . Alcohol Use: 21.6 oz/week    36 Shots of liquor per week     Comment: 03/01/2013 "drink a pint of brandy q 2 days"    Review of Systems  Unable to perform ROS     Allergies  Review of patient's allergies indicates no known allergies.  Home Medications   Prior to Admission medications   Medication Sig Start Date End Date Taking? Authorizing Provider  acetaminophen (TYLENOL) 500 MG tablet Take 1,000 mg by mouth every 6 (six) hours as needed for mild pain.    Historical Provider, MD  amLODipine (NORVASC) 5 MG tablet Take 5 mg by mouth daily.    Historical Provider, MD  cephALEXin (KEFLEX)  500 MG capsule Take 1 capsule (500 mg total) by mouth 4 (four) times daily. 12/17/13   Blanchie Dessert, MD  feeding supplement, ENSURE COMPLETE, (ENSURE COMPLETE) LIQD Take 237 mLs by mouth 2 (two) times daily between meals. 03/04/13   Wilber Oliphant, MD  levETIRAcetam (KEPPRA) 500 MG tablet Take 1 tablet (500 mg total) by mouth 2 (two) times daily. 12/17/13   Hoy Morn, MD  lisinopril (PRINIVIL,ZESTRIL) 10 MG tablet Take 1 tablet (10 mg total) by mouth daily. 03/04/13   Wilber Oliphant, MD  sertraline (ZOLOFT) 100 MG tablet Take 200 mg by mouth daily. 07/07/11   Robbie Lis, MD   There were no vitals taken for this visit. Physical Exam  Constitutional: He is oriented to person, place, and time. He appears lethargic. He appears cachectic. He appears toxic. He has a sickly appearance. He appears ill. He appears distressed.  HENT:  Head: Normocephalic and atraumatic.  Eyes: Conjunctivae, EOM and lids are normal. Pupils are equal, round, and reactive to light.  Neck: Normal range of motion. Neck supple. No tracheal deviation present. No thyroid mass present.  Cardiovascular: Normal rate, regular rhythm and normal heart sounds.  Exam reveals no gallop.   No murmur heard. Pulmonary/Chest: Effort normal and breath sounds normal. No stridor. No respiratory distress. He has no decreased breath sounds. He  has no wheezes. He has no rhonchi. He has no rales.  Abdominal: Soft. Normal appearance and bowel sounds are normal. He exhibits no distension. There is no tenderness. There is no rebound and no CVA tenderness.  Musculoskeletal: Normal range of motion. He exhibits no edema or tenderness.  Neurological: He is oriented to person, place, and time. He appears lethargic. He displays seizure activity. GCS eye subscore is 2. GCS verbal subscore is 2. GCS motor subscore is 5.  Patient moves all 4 extremities. Uncooperative with exam  Skin: Skin is warm and dry. No abrasion and no rash noted.  Psychiatric:  He has a normal mood and affect. His speech is normal and behavior is normal.  Nursing note and vitals reviewed.   ED Course  Procedures (including critical care time) Labs Review Labs Reviewed  URINE CULTURE  ETHANOL  URINE RAPID DRUG SCREEN (HOSP PERFORMED)  CBC WITH DIFFERENTIAL  COMPREHENSIVE METABOLIC PANEL  PHENYTOIN LEVEL, TOTAL  AMMONIA  URINALYSIS, ROUTINE W REFLEX MICROSCOPIC  TROPONIN I    Imaging Review No results found.   EKG Interpretation   Date/Time:  Sunday February 25 2014 09:19:21 EST Ventricular Rate:  98 PR Interval:  202 QRS Duration: 89 QT Interval:  379 QTC Calculation: 484 R Axis:   65 Text Interpretation:  Sinus rhythm Borderline prolonged QT interval No  significant change since last tracing Confirmed by Aibhlinn Kalmar  MD, Khadeejah Castner  ([id number]) on 02/25/2014 11:08:57 AM      MDM   Final diagnoses:  Altered mental status   Patient given Ativan on arrival due to seizure. Rechecked multiple times here and Patient's mental status has significantly improved. Patient's elevated ammonia noted, and lactulose ordered. Patient be given keppra. Will consult neurology and have also spoken with internal medicine he will be admitted to stepdown  CRITICAL CARE Performed by: Leota Jacobsen Total critical care time: 60 Critical care time was exclusive of separately billable procedures and treating other patients. Critical care was necessary to treat or prevent imminent or life-threatening deterioration. Critical care was time spent personally by me on the following activities: development of treatment plan with patient and/or surrogate as well as nursing, discussions with consultants, evaluation of patient's response to treatment, examination of patient, obtaining history from patient or surrogate, ordering and performing treatments and interventions, ordering and review of laboratory studies, ordering and review of radiographic studies, pulse oximetry and  re-evaluation of patient's condition.     Leota Jacobsen, MD 02/25/14 [phone]

## 2014-02-25 NOTE — ED Notes (Signed)
Patient presents to ed via [facility] states he was talking to his son at 15 am  At 108 am son noticed patient wasn;'t speaking . Ems called code stroke, up entrance to ed patient was seizing, place in trauma A and Dr. Zenia Resides at bedside. Seizure lasted approx. 1 min. No injury noted.

## 2014-02-25 NOTE — Consult Note (Signed)
Neurology Consultation Reason for Consult: aphasia Referring Physician: Zenia Resides, A  CC: Aphasia  History is obtained from:EMS  HPI: Miguel Hanson is a 68 y.o. male who was appparently talking ok this morning but then was seen to become acutely confused. EMS was called for this reason, and en route he began having a seizure which was ongoing at the time of arrival to the emergency department. He is in a clonic phase of a generalized seizure at time when I first saw him. Following this, he continued to have aphasia and therefore a stat MRI was obtained in case he was having a stroke precipitated seizure, however this was negative without signs of acute infarct and therefore I suspect seizure as a primary etiology. An EEG was obtained given persistent confusion which was negative for ongoing seizure activity. His ammonia is elevated and he is being admitted for suspected that encephalopathy.  He was on Dilantin in the past but due to recurrently having Dilantin toxicity, at his last hospitalization in January he was changed from Dilantin to [place].      ROS: Unable to obtain due to altered mental status.   Past Medical History  Diagnosis Date  . Hypertension   . Seizures   . ETOH abuse   . Drug abuse   . PTSD (post-traumatic stress disorder)   . H/O prolonged Q-T interval on ECG   . Polysubstance abuse   . Alcohol abuse   . Depression     Family History: Unable to obtain due to altered mental status.  Social History: Tob: Unable to obtain due to altered mental status.  Exam: Current vital signs: BP 158/107 mmHg  Pulse 94  Temp(Src) 98.6 F (37 C) (Oral)  Resp 18  Ht 5\' 8"  (1.727 m)  Wt 50.803 kg (112 lb)  BMI 17.03 kg/m2  SpO2 98% Vital signs in last 24 hours: Temp:  [98 F (36.7 C)-98.6 F (37 C)] 98.6 F (37 C) (12/27 1350) Pulse Rate:  [94-107] 94 (12/27 1350) Resp:  [15-23] 18 (12/27 1350) BP: (139-175)/(91-110) 158/107 mmHg (12/27 1350) SpO2:  [97 %-100 %] 98 %  (12/27 1350) Weight:  [50.803 kg (112 lb)] 50.803 kg (112 lb) (12/27 1345)  Physical Exam  Constitutional: Appears well-developed and well-nourished.  Psych: Affect appropriate to situation Eyes: No scleral injection HENT: No OP obstrucion Head: Normocephalic.  Cardiovascular: Normal rate and regular rhythm.  Respiratory: Effort normal and breath sounds normal to anterior ascultation GI: Soft.  No distension. There is no tenderness.  Skin: WDI  Neuro: Mental Status: Patient is awake, but appears aphasic, his speech is nonsensical. He often says yes in answer to questions. Cranial Nerves: II: He appears to blink to threat from bilaterally, however this is inconsistent.. Pupils are equal, round, and reactive to light.   III,IV, VI: EOMI without ptosis or diploplia.  V: Facial sensation is symmetric to temperature VII: Facial movement is symmetric.  VIII: hearing is intact to voice X: Uvula elevates symmetrically XI: Shoulder shrug is symmetric. XII: tongue is midline without atrophy or fasciculations.  Motor: He moves all extremities voluntarily, and appears to not have any clear weakness but he does not participate with exam. Sensory: Response to noxious stimuli throughout. Deep Tendon Reflexes: 2+ and symmetric in the biceps and patellae.  Cerebellar: Unable to obtain due to altered mental status.        I have reviewed labs in epic and the results pertinent to this consultation are: Ammonia 226  I have reviewed  the images obtained: CT head-negative  Impression: 68 year old male presenting with encephalopathy with seizure. Given the breakthrough seizure, I would increase his Keppra and treat for hepatic encephalopathy.  Recommendations: 1) would treat for his likely hepatic encephalopathy, medicine sending a recheck of his ammonia level and I agree with this. 2) Keppra 1500 mg 1 followed by 1000 g twice a day 3) neurology will continue to follow   Roland Rack, MD [facility] [phone]  If 7pm- 7am, please page neurology on call as listed in [place].

## 2014-02-25 NOTE — ED Notes (Signed)
Return from MRI incont. Of urine linens changed. Patient will say works however not making sense.

## 2014-02-25 NOTE — Procedures (Signed)
History: 68 year old male with a history of seizures. He presented with aphasia, but seizure.  Sedation: None  Technique: This is a 17 channel routine scalp EEG performed at the bedside with bipolar and monopolar montages arranged in accordance to the international 10/20 system of electrode placement. One channel was dedicated to EKG recording.    Background: The background consisted predominantly of alpha and beta activities. There is a well defined posterior dominant rhythm of 8 Hz that attenuates with eye opening. There is an increase in delta associated with drowsiness but sleep structures are not observed.  Photic stimulation: Physiologic driving is not performed  EEG Abnormalities: None  Clinical Interpretation: This normal EEG is recorded in the waking and drowsy state. There was no seizure or seizure predisposition recorded on this study.   Roland Rack, MD [facility] [phone]  If 7pm- 7am, please page neurology on call as listed in [place].

## 2014-02-25 NOTE — H&P (Signed)
[facility] History and Physical  Blong Busk JJK:[id number] DOB: [birth year] DOA: 02/25/2014  Referring physician:Dr Zenia Resides.  PCP: [facility]   Chief Complaint: AMS.   HPI: Miguel Hanson is a 68 y.o. male with PMH significant for seizure disorder, alcohol abuse, HTN who was brought to ED with AMS, unable to speak, this per cords happen suddenly. On arrival to ED patient had generalized tonic -clonic seizure. Unclear what medications patient has been taking for seizure. supposedly his on keppra and dilantin. History obtain from ED;  Patient unable to provide history due to confusion.  Evaluation in the ED; CT head no acute finding. MRI no evidence of acute infarct. Ammonia level 224. alcohol level at 26.  Review of Systems:  Unable to obtain from patient due to confusion.   Past Medical History  Diagnosis Date  . Hypertension   . Seizures   . ETOH abuse   . Drug abuse   . PTSD (post-traumatic stress disorder)   . H/O prolonged Q-T interval on ECG   . Polysubstance abuse   . Alcohol abuse   . Depression    Past Surgical History  Procedure Laterality Date  . No past surgeries     Social History:  reports that he has been smoking Cigarettes.  He has a 24.5 pack-year smoking history. He uses smokeless tobacco. He reports that he drinks about 21.6 oz of alcohol per week. He reports that he uses illicit drugs (Marijuana).  No Known Allergies  Family history: unable to obtain from patient,.   Prior to Admission medications   Medication Sig Start Date End Date Taking? Authorizing Provider  acetaminophen (TYLENOL) 500 MG tablet Take 1,000 mg by mouth every 6 (six) hours as needed for mild pain.    Historical Provider, MD  amLODipine (NORVASC) 5 MG tablet Take 5 mg by mouth daily.    Historical Provider, MD  cephALEXin (KEFLEX) 500 MG capsule Take 1 capsule (500 mg total) by mouth 4 (four) times daily. 12/17/13   Blanchie Dessert, MD  feeding supplement, ENSURE  COMPLETE, (ENSURE COMPLETE) LIQD Take 237 mLs by mouth 2 (two) times daily between meals. 03/04/13   Wilber Oliphant, MD  levETIRAcetam (KEPPRA) 500 MG tablet Take 1 tablet (500 mg total) by mouth 2 (two) times daily. 12/17/13   Hoy Morn, MD  lisinopril (PRINIVIL,ZESTRIL) 10 MG tablet Take 1 tablet (10 mg total) by mouth daily. 03/04/13   Wilber Oliphant, MD  sertraline (ZOLOFT) 100 MG tablet Take 200 mg by mouth daily. 07/07/11   Robbie Lis, MD   Physical Exam: Filed Vitals:   02/25/14 3716 02/25/14 0945 02/25/14 1000 02/25/14 1106  BP: 146/110 175/109 169/101 153/92  Pulse: 95   98  Resp: 18  16 15   SpO2: 100%   97%    Wt Readings from Last 3 Encounters:  12/17/13 52.617 kg (116 lb)  03/03/13 52.753 kg (116 lb 4.8 oz)  12/12/12 53 kg (116 lb 13.5 oz)    General:  Appears calm and comfortable Eyes: PERRL, normal lids, irises & conjunctiva ENT: grossly normal hearing, lips & tongue Neck: no LAD, masses or thyromegaly Cardiovascular: RRR, no m/r/g. No LE edema. Respiratory: CTA bilaterally, no w/r/r. Normal respiratory effort. Abdomen: soft, ntnd Skin: no rash or induration seen on limited exam Musculoskeletal: grossly normal tone BUE/BLE Neurologic: confuse, intermittently following command, move upper and lower extremities, repeating same words.           Labs on Admission:  Basic Metabolic Panel:  Recent Labs Lab 02/25/14 0926  NA 144  K 4.4  CL 104  CO2 20  GLUCOSE 113*  BUN 5*  CREATININE 0.54  CALCIUM 9.8   Liver Function Tests:  Recent Labs Lab 02/25/14 0926  AST 71*  ALT 15  ALKPHOS 100  BILITOT 0.8  PROT 8.3  ALBUMIN 4.4   No results for input(s): LIPASE, AMYLASE in the last 168 hours.  Recent Labs Lab 02/25/14 0925  AMMONIA 224*   CBC:  Recent Labs Lab 02/25/14 0926  WBC 8.8  NEUTROABS 3.1  HGB 13.0  HCT 36.6*  MCV 112.3*  PLT 126*   Cardiac Enzymes:  Recent Labs Lab 02/25/14 0926  TROPONINI <0.03    BNP (last 3  results) No results for input(s): PROBNP in the last 8760 hours. CBG:  Recent Labs Lab 02/25/14 0931  GLUCAP 144*    Radiological Exams on Admission: Ct Head Wo Contrast  02/25/2014   CLINICAL DATA:  Altered mental status/confusion.  EXAM: CT HEAD WITHOUT CONTRAST  TECHNIQUE: Contiguous axial images were obtained from the base of the skull through the vertex without intravenous contrast.  COMPARISON:  11/24/2013 and 12/11/2012  FINDINGS: Ventricles and cisterns are within normal. There is mild age related atrophic change as well as chronic ischemic microvascular disease. There is no mass, mass effect, shift of midline structures or acute hemorrhage. There is no evidence of acute infarction. Mild chronic sinus inflammatory disease unchanged mucoperiosteal thickening involving the left sphenoid sinus unchanged. Old nasal bone fracture.  IMPRESSION: No acute intracranial findings.  Mild age related atrophic change in chronic ischemic microvascular disease.  Chronic sinus inflammatory disease.   Electronically Signed   By: Marin Olp [name]   On: 02/25/2014 10:26   [name] Contrast  02/25/2014   CLINICAL DATA:  68 year old male code stroke, altered mental status, weakness, with questionable hyperdense left MCA on noncontrast CT. Initial encounter.  EXAM: MRI HEAD WITHOUT CONTRAST  TECHNIQUE: Multiplanar, multiecho pulse sequences of the brain and surrounding structures were obtained without intravenous contrast.  COMPARISON:  Head CT without contrast 0955 hr the same day.  FINDINGS: Due to patient agitation, The examination had to be discontinued prior to completion, such that only axial diffusion weighted imaging is obtained.  No restricted diffusion or evidence of acute infarction. No signal abnormality identified in the left MCA territory.  No midline shift or intracranial mass effect.  No ventriculomegaly.  IMPRESSION: Truncated exam due to patient agitation with axial diffusion-weighted  imaging provided. No evidence of acute infarct.  Preliminary results discussed with Dr. Roland Rack at 1050 hrs on 02/25/2014.   Electronically Signed   By: Lars Pinks [name]   On: 02/25/2014 11:54    EKG: Independently reviewed. Sinus rhythm.   Assessment/Plan Active Problems:   Hepatic encephalopathy  1-Encephalopathy; patient with history of alcohol abuse. Ammonia level 224.  Started lactulose TID.  Repeat ammonia level in am.   2-Alcohol abuse; presents with seizure. CIWA protocol ordered. Thiamine and folate.   3-Seizure; witness seizure in the ED. Discussed with Dr Leonel Ramsay IV keppra 1000 mg BID. He will give recommendation regarding dilantin. EEG ordered. Check magnesium level.   4-HTN; continue with lisinopril.  5-Mild thrombocytopenia; follow trend.   Code Status: full code.  DVT Prophylaxis: heparin.  Family Communication; no family at bedside.  Disposition Plan: expect 2 to 3 days.   Time spent: 75 minutes.   Niel Hummer A [facility] Pager [phone]

## 2014-02-25 NOTE — Progress Notes (Signed)
Stat EEG completed at bedside.  Dr. Leonel Ramsay aware.  Results pending.

## 2014-02-25 NOTE — ED Notes (Addendum)
EEG at bedside.

## 2014-02-26 DIAGNOSIS — K7290 Hepatic failure, unspecified without coma: Principal | ICD-10-CM

## 2014-02-26 LAB — COMPREHENSIVE METABOLIC PANEL
ALT: 12 U/L (ref 0–53)
ANION GAP: 8 (ref 5–15)
AST: 45 U/L — AB (ref 0–37)
Albumin: 3.5 g/dL (ref 3.5–5.2)
Alkaline Phosphatase: 77 U/L (ref 39–117)
CO2: 27 mmol/L (ref 19–32)
CREATININE: 0.52 mg/dL (ref 0.50–1.35)
Calcium: 8.5 mg/dL (ref 8.4–10.5)
Chloride: 102 mEq/L (ref 96–112)
Glucose, Bld: 95 mg/dL (ref 70–99)
Potassium: 2.7 mmol/L — CL (ref 3.5–5.1)
Sodium: 137 mmol/L (ref 135–145)
Total Bilirubin: 1.3 mg/dL — ABNORMAL HIGH (ref 0.3–1.2)
Total Protein: 6.5 g/dL (ref 6.0–8.3)

## 2014-02-26 LAB — CBC
HCT: 32.5 % — ABNORMAL LOW (ref 39.0–52.0)
Hemoglobin: 11.6 g/dL — ABNORMAL LOW (ref 13.0–17.0)
MCH: 40.3 pg — ABNORMAL HIGH (ref 26.0–34.0)
MCHC: 35.7 g/dL (ref 30.0–36.0)
MCV: 112.8 fL — AB (ref 78.0–100.0)
PLATELETS: 102 10*3/uL — AB (ref 150–400)
RBC: 2.88 MIL/uL — ABNORMAL LOW (ref 4.22–5.81)
RDW: 19 % — AB (ref 11.5–15.5)
WBC: 8.5 10*3/uL (ref 4.0–10.5)

## 2014-02-26 LAB — POTASSIUM: Potassium: 3.3 mmol/L — ABNORMAL LOW (ref 3.5–5.1)

## 2014-02-26 LAB — MAGNESIUM: MAGNESIUM: 1.8 mg/dL (ref 1.5–2.5)

## 2014-02-26 LAB — AMMONIA: Ammonia: 14 umol/L (ref 11–32)

## 2014-02-26 MED ORDER — PNEUMOCOCCAL VAC POLYVALENT 25 MCG/0.5ML IJ INJ
0.5000 mL | INJECTION | INTRAMUSCULAR | Status: AC
  Administered 2014-02-27: 0.5 mL via INTRAMUSCULAR
  Filled 2014-02-26: qty 0.5

## 2014-02-26 MED ORDER — INFLUENZA VAC SPLIT QUAD 0.5 ML IM SUSY
0.5000 mL | PREFILLED_SYRINGE | INTRAMUSCULAR | Status: DC
  Filled 2014-02-26: qty 0.5

## 2014-02-26 MED ORDER — MAGNESIUM SULFATE IN D5W 10-5 MG/ML-% IV SOLN
1.0000 g | Freq: Once | INTRAVENOUS | Status: AC
  Administered 2014-02-26: 1 g via INTRAVENOUS
  Filled 2014-02-26: qty 100

## 2014-02-26 MED ORDER — POTASSIUM CHLORIDE 10 MEQ/100ML IV SOLN
10.0000 meq | INTRAVENOUS | Status: AC
  Administered 2014-02-26 (×2): 10 meq via INTRAVENOUS
  Filled 2014-02-26 (×2): qty 100

## 2014-02-26 MED ORDER — ACETAMINOPHEN 650 MG RE SUPP: 325.0000 mg | Freq: Four times a day (QID) | RECTAL | Status: DC | PRN

## 2014-02-26 MED ORDER — POTASSIUM CHLORIDE 10 MEQ/100ML IV SOLN
10.0000 meq | INTRAVENOUS | Status: AC
  Administered 2014-02-26 (×3): 10 meq via INTRAVENOUS
  Filled 2014-02-26 (×4): qty 100

## 2014-02-26 MED ORDER — NICOTINE 21 MG/24HR TD PT24
21.0000 mg | MEDICATED_PATCH | Freq: Every day | TRANSDERMAL | Status: DC
  Administered 2014-02-26 – 2014-02-27 (×2): 21 mg via TRANSDERMAL
  Filled 2014-02-26 (×2): qty 1

## 2014-02-26 MED ORDER — ACETAMINOPHEN 325 MG PO TABS: 325.0000 mg | ORAL_TABLET | Freq: Four times a day (QID) | ORAL | Status: DC | PRN

## 2014-02-26 MED ORDER — MAGNESIUM SULFATE 2 GM/50ML IV SOLN: 2.0000 g | Freq: Once | INTRAVENOUS | Status: DC

## 2014-02-26 MED ORDER — POTASSIUM CHLORIDE 10 MEQ/100ML IV SOLN
10.0000 meq | INTRAVENOUS | Status: DC
Start: 2014-02-26 — End: 2014-02-26
  Administered 2014-02-26 (×2): 10 meq via INTRAVENOUS
  Filled 2014-02-26 (×2): qty 100

## 2014-02-26 NOTE — Progress Notes (Signed)
NEURO HOSPITALIST PROGRESS NOTE   SUBJECTIVE:                                                                                                                        No further seizures noted, awake but remains confused. On keppra 1 gram BID. EEG unremarkable. MRI brain showed no acute abnormality on DWI.  OBJECTIVE:                                                                                                                           Vital signs in last 24 hours: Temp:  [98 F (36.7 C)-99.4 F (37.4 C)] 98.2 F (36.8 C) (12/27 2329) Pulse Rate:  [71-107] 91 (12/28 0344) Resp:  [15-24] 24 (12/27 2329) BP: (133-175)/(80-142) 134/94 mmHg (12/28 0600) SpO2:  [96 %-100 %] 96 % (12/28 0344) Weight:  [50.803 kg (112 lb)-52.2 kg (115 lb 1.3 oz)] 52.2 kg (115 lb 1.3 oz) (12/28 0000)  Intake/Output from previous day: 12/27 0701 - 12/28 0700 In: 1751.3 [P.O.:240; I.V.:211.3; IV Piggyback:100] Out: 700 [Urine:700] Intake/Output this shift:   Nutritional status: Diet NPO time specified  Past Medical History  Diagnosis Date  . Hypertension   . Seizures   . ETOH abuse   . Drug abuse   . PTSD (post-traumatic stress disorder)   . H/O prolonged Q-T interval on ECG   . Polysubstance abuse   . Alcohol abuse   . Depression     Physical exam: pleasantly confused male in no apparent distress. Head: normocephalic. Neck: supple, no bruits, no JVD. Cardiac: no murmurs. Lungs: clear. Abdomen: soft, no tender, no mass. Extremities: no edema.  Neurologic Exam:  Neuro: Mental Status: Patient is awake, knows he is in the hospital but disoriented to year-month-day, his speech is nonsensical. He often says yes in answer to questions. Cranial Nerves: II: He appears to blink to threat from bilaterally, however this is inconsistent.. Pupils are equal, round, and reactive to light.  III,IV, VI: EOMI without ptosis or diploplia.  V: Facial sensation is  symmetric to temperature VII: Facial movement is symmetric.  VIII: hearing is intact to voice X: Uvula elevates symmetrically XI: Shoulder shrug is symmetric. XII: tongue is midline without atrophy or fasciculations.  Motor:  He moves all extremities voluntarily. Sensory: Response to noxious stimuli throughout. Deep Tendon Reflexes: 2+ and symmetric in the biceps and patellae.  Cerebellar: Unable to obtain due to patient's confusion Lab Results: Lab Results  Component Value Date/Time   CHOL  09/23/2008 06:48 PM    130        ATP III CLASSIFICATION:  <200     mg/dL   Desirable  200-239  mg/dL   Borderline High  >=240    mg/dL   High          Lipid Panel No results for input(s): CHOL, TRIG, HDL, CHOLHDL, VLDL, LDLCALC in the last 72 hours.  Studies/Results: Ct Head Wo Contrast  02/25/2014   CLINICAL DATA:  Altered mental status/confusion.  EXAM: CT HEAD WITHOUT CONTRAST  TECHNIQUE: Contiguous axial images were obtained from the base of the skull through the vertex without intravenous contrast.  COMPARISON:  11/24/2013 and 12/11/2012  FINDINGS: Ventricles and cisterns are within normal. There is mild age related atrophic change as well as chronic ischemic microvascular disease. There is no mass, mass effect, shift of midline structures or acute hemorrhage. There is no evidence of acute infarction. Mild chronic sinus inflammatory disease unchanged mucoperiosteal thickening involving the left sphenoid sinus unchanged. Old nasal bone fracture.  IMPRESSION: No acute intracranial findings.  Mild age related atrophic change in chronic ischemic microvascular disease.  Chronic sinus inflammatory disease.   Electronically Signed   By: Marin Olp [name]   On: 02/25/2014 10:26   [name] Contrast  02/25/2014   CLINICAL DATA:  68 year old male code stroke, altered mental status, weakness, with questionable hyperdense left MCA on noncontrast CT. Initial encounter.  EXAM: MRI HEAD WITHOUT  CONTRAST  TECHNIQUE: Multiplanar, multiecho pulse sequences of the brain and surrounding structures were obtained without intravenous contrast.  COMPARISON:  Head CT without contrast 0955 hr the same day.  FINDINGS: Due to patient agitation, The examination had to be discontinued prior to completion, such that only axial diffusion weighted imaging is obtained.  No restricted diffusion or evidence of acute infarction. No signal abnormality identified in the left MCA territory.  No midline shift or intracranial mass effect.  No ventriculomegaly.  IMPRESSION: Truncated exam due to patient agitation with axial diffusion-weighted imaging provided. No evidence of acute infarct.  Preliminary results discussed with Dr. Roland Rack at 1050 hrs on 02/25/2014.   Electronically Signed   By: Lars Pinks [name]   On: 02/25/2014 11:54    MEDICATIONS                                                                                                                        Scheduled: . amLODipine  5 mg Oral Daily  . folic acid  1 mg Oral Daily  . heparin  5,000 Units Subcutaneous 3 times per day  . [START ON 02/27/2014] Influenza vac split quadrivalent PF  0.5 mL Intramuscular Tomorrow-1000  . lactulose  30 g Oral Once  .  lactulose  30 g Oral TID  . levETIRAcetam  1,000 mg Intravenous Q12H  . lisinopril  10 mg Oral Daily  . LORazepam  0-4 mg Intravenous Q6H   Followed by  . [START ON 02/27/2014] LORazepam  0-4 mg Intravenous Q12H  . magnesium sulfate 1 - 4 g bolus IVPB  1 g Intravenous Once  . multivitamin with minerals  1 tablet Oral Daily  . [START ON 02/27/2014] pneumococcal 23 valent vaccine  0.5 mL Intramuscular Tomorrow-1000  . potassium chloride  10 mEq Intravenous Q1 Hr x 4  . sertraline  200 mg Oral Daily  . sodium chloride  3 mL Intravenous Q12H  . thiamine  100 mg Oral Daily   Or  . thiamine  100 mg Intravenous Daily    ASSESSMENT/PLAN:                                                                                                             68 year old male presenting with breakthrough seizure and hepatic encephalopathy. He remains confused. Ammonia back to normal today. Continue keppra and lactulose.  Dorian Pod, MD [facility] [phone]  02/26/2014, 7:39 AM

## 2014-02-26 NOTE — Progress Notes (Signed)
[name]  Miguel Hanson VPX:[id number] DOB: 07/05/1945 DOA: 02/25/2014 PCP: [facility]  Brief narrative: 68 year old w/ a history of seizure disorder, alcohol abuse, and hypertension who was brought to ER with altered mental status. Unable to speak upon arrival. In the ER had generalized tonic clonic seizure. CT head was w/o acute findings. MRI no evidence of infarct. Ammonia level was 224 - alcohol level was 26.  Since admission evaluated by Neurology. No further seizures since admission. Patient admitted to daily alcohol use with abrupt cessation prior to onset of seizures.  HPI/Subjective: Somewhat confused; holding remote control for television but complaining he was unable to "use this thing" to make call.  Can't tell me where he is or why he is here.    Assessment/Plan:  Hepatic encephalopathy -Resolved - continue lactulose - ammonia level down to 14  Toxic metabolic encephalopathy -Due primarily to EtOH abuse and withdrawal - cont CIWA and watch for acute decompensation   Seizure -Multifactorial due to alcohol withdrawal and nonadherence to medications - Neuro following - continue Keppra  Alcohol abuse -Continue CIWA - continue Folvite and MVI - cont IV fluid - enterinf time period for withdrawal sx  Hypokalemia -Replace and follow - due to EtOH related malnutrition   Hypomagnesemia -Replace and follow - due to EtOH related malnutrition   Hypertension -Continue lisinopril and Norvasc  COPD  -Asymptomatic  Borderline/prolonged QTc -Must avoid meds w/ potential to worsen prolonged QTc - replace Mg to goal of 2.0 or >  DVT prophylaxis: change to SCDs given declining plts and probable EtOH liver disease Code Status: Full Family Communication: No family at bedside Disposition Plan/Expected LOS: cancel transfer to floor - keep in SDU due to high risk for progression into full blown  DTs  Consultants: Neurology/Dr. Aram Beecham  Procedures: EEG: No evidence of seizure activity  Antibiotics: None  Objective: Blood pressure 123/96, pulse 94, temperature 97.6 F (36.4 C), temperature source Oral, resp. rate 17, height 5\' 8"  (1.727 m), weight 115 lb 1.3 oz (52.2 kg), SpO2 95 %.  Intake/Output Summary (Last 24 hours) at 02/26/14 1201 Last data filed at 02/26/14 0600  Gross per 24 hour  Intake 1751.25 ml  Output    700 ml  Net 1051.25 ml   Exam: Gen: No acute respiratory distress - confused Chest: Clear to auscultation bilaterally without wheezes, rhonchi or crackles, room air Cardiac: Regular rate and rhythm, S1-S2, no rubs murmurs or gallops Abdomen: Soft nontender nondistended without obvious hepatosplenomegaly, no ascites Extremities: no signif C/C/E B le   Scheduled Meds:  Scheduled Meds: . amLODipine  5 mg Oral Daily  . folic acid  1 mg Oral Daily  . heparin  5,000 Units Subcutaneous 3 times per day  . [START ON 02/27/2014] Influenza vac split quadrivalent PF  0.5 mL Intramuscular Tomorrow-1000  . lactulose  30 g Oral Once  . lactulose  30 g Oral TID  . levETIRAcetam  1,000 mg Intravenous Q12H  . lisinopril  10 mg Oral Daily  . LORazepam  0-4 mg Intravenous Q6H   Followed by  . [START ON 02/27/2014] LORazepam  0-4 mg Intravenous Q12H  . multivitamin with minerals  1 tablet Oral Daily  . [START ON 02/27/2014] pneumococcal 23 valent vaccine  0.5 mL Intramuscular Tomorrow-1000  . potassium chloride  10 mEq Intravenous Q1 Hr x 4  . sertraline  200 mg Oral Daily  . sodium chloride  3 mL Intravenous  Q12H  . thiamine  100 mg Oral Daily   Or  . thiamine  100 mg Intravenous Daily   Data Reviewed: Basic Metabolic Panel:  Recent Labs Lab 02/25/14 0926 02/25/14 1453 02/26/14 0530  NA 144  --  137  K 4.4  --  2.7*  CL 104  --  102  CO2 20  --  27  GLUCOSE 113*  --  95  BUN 5*  --  <5*  CREATININE 0.54  --  0.52  CALCIUM 9.8  --  8.5  MG  --  1.3*   --    Liver Function Tests:  Recent Labs Lab 02/25/14 0926 02/26/14 0530  AST 71* 45*  ALT 15 12  ALKPHOS 100 77  BILITOT 0.8 1.3*  PROT 8.3 6.5  ALBUMIN 4.4 3.5    Recent Labs Lab 02/25/14 0925 02/26/14 0530  AMMONIA 224* 14   CBC:  Recent Labs Lab 02/25/14 0926 02/26/14 0530  WBC 8.8 8.5  NEUTROABS 3.1  --   HGB 13.0 11.6*  HCT 36.6* 32.5*  MCV 112.3* 112.8*  PLT 126* 102*   Cardiac Enzymes:  Recent Labs Lab 02/25/14 0926  TROPONINI <0.03   CBG:  Recent Labs Lab 02/25/14 0931  GLUCAP 144*    Recent Results (from the past 240 hour(s))  MRSA PCR Screening     Status: None   Collection Time: 02/25/14  1:58 PM  Result Value Ref Range Status   MRSA by PCR NEGATIVE NEGATIVE Final    Comment:        The GeneXpert MRSA Assay (FDA approved for NASAL specimens only), is one component of a comprehensive MRSA colonization surveillance program. It is not intended to diagnose MRSA infection nor to guide or monitor treatment for MRSA infections.      Studies:  Recent x-ray studies have been reviewed in detail by the Attending Physician  Time spent :  [place], ANP [facility] Office  [phone] Pager [phone]   On-Call/Text Page:      [email or web address]      password [email or web address]  If 7PM-7AM, please contact night-coverage [email or web address] Password [email or web address] 02/26/2014, 12:01 PM   LOS: 1 day   I have personally examined this patient and reviewed the entire database. I have reviewed the above note, made any necessary editorial changes, and agree with its content.  Cherene Altes, MD [facility]

## 2014-02-26 NOTE — Progress Notes (Signed)
Utilization review completed. Ahni Bradwell, RN, BSN. 

## 2014-02-26 NOTE — Progress Notes (Signed)
CRITICAL VALUE ALERT  Critical value received:  K+ 2.7   Date of notification:  02/26/14  Time of notification:  0630  Critical value read back: yes  Nurse who received alert:  Alessandra Grout, RN  MD notified (1st page):  Lacy Duverney PA  Time of first page:  6:30  MD notified (2nd page):  Time of second page:  Responding MD:  Lacy Duverney PA  Time MD responded:  [phone]  Orders given.

## 2014-02-27 DIAGNOSIS — G928 Other toxic encephalopathy: Secondary | ICD-10-CM | POA: Insufficient documentation

## 2014-02-27 DIAGNOSIS — I10 Essential (primary) hypertension: Secondary | ICD-10-CM

## 2014-02-27 DIAGNOSIS — E876 Hypokalemia: Secondary | ICD-10-CM | POA: Insufficient documentation

## 2014-02-27 DIAGNOSIS — R9431 Abnormal electrocardiogram [ECG] [EKG]: Secondary | ICD-10-CM | POA: Insufficient documentation

## 2014-02-27 DIAGNOSIS — I4581 Long QT syndrome: Secondary | ICD-10-CM

## 2014-02-27 DIAGNOSIS — G92 Toxic encephalopathy: Secondary | ICD-10-CM | POA: Insufficient documentation

## 2014-02-27 DIAGNOSIS — K7290 Hepatic failure, unspecified without coma: Secondary | ICD-10-CM | POA: Diagnosis not present

## 2014-02-27 LAB — AMMONIA: Ammonia: 17 umol/L (ref 11–32)

## 2014-02-27 LAB — CBC WITH DIFFERENTIAL/PLATELET
Basophils Absolute: 0 10*3/uL (ref 0.0–0.1)
Basophils Relative: 0 % (ref 0–1)
EOS ABS: 0.3 10*3/uL (ref 0.0–0.7)
Eosinophils Relative: 4 % (ref 0–5)
HCT: 30.9 % — ABNORMAL LOW (ref 39.0–52.0)
Hemoglobin: 11 g/dL — ABNORMAL LOW (ref 13.0–17.0)
Lymphocytes Relative: 30 % (ref 12–46)
Lymphs Abs: 2 10*3/uL (ref 0.7–4.0)
MCH: 39.4 pg — AB (ref 26.0–34.0)
MCHC: 35.6 g/dL (ref 30.0–36.0)
MCV: 110.8 fL — AB (ref 78.0–100.0)
Monocytes Absolute: 0.6 10*3/uL (ref 0.1–1.0)
Monocytes Relative: 9 % (ref 3–12)
NEUTROS PCT: 57 % (ref 43–77)
Neutro Abs: 3.9 10*3/uL (ref 1.7–7.7)
PLATELETS: 104 10*3/uL — AB (ref 150–400)
RBC: 2.79 MIL/uL — ABNORMAL LOW (ref 4.22–5.81)
RDW: 18.8 % — ABNORMAL HIGH (ref 11.5–15.5)
WBC: 6.8 10*3/uL (ref 4.0–10.5)

## 2014-02-27 LAB — COMPREHENSIVE METABOLIC PANEL
ALBUMIN: 3.3 g/dL — AB (ref 3.5–5.2)
ALT: 25 U/L (ref 0–53)
AST: 177 U/L — ABNORMAL HIGH (ref 0–37)
Alkaline Phosphatase: 77 U/L (ref 39–117)
Anion gap: 6 (ref 5–15)
BILIRUBIN TOTAL: 1 mg/dL (ref 0.3–1.2)
BUN: <5 mg/dL — ABNORMAL LOW (ref 6–23)
CHLORIDE: 104 meq/L (ref 96–112)
CO2: 25 mmol/L (ref 19–32)
CREATININE: 0.51 mg/dL (ref 0.50–1.35)
Calcium: 8.2 mg/dL — ABNORMAL LOW (ref 8.4–10.5)
GFR calc Af Amer: >90 mL/min (ref >90)
Glucose, Bld: 85 mg/dL (ref 70–99)
Potassium: 3.8 mmol/L (ref 3.5–5.1)
SODIUM: 135 mmol/L (ref 135–145)
Total Protein: 6.4 g/dL (ref 6.0–8.3)

## 2014-02-27 LAB — URINE CULTURE: Colony Count: >=100000

## 2014-02-27 LAB — MAGNESIUM: MAGNESIUM: 1.8 mg/dL (ref 1.5–2.5)

## 2014-02-27 MED ORDER — LACTULOSE 10 GM/15ML PO SOLN: 30.0000 g | Freq: Every day | ORAL | Status: DC

## 2014-02-27 MED ORDER — MAGNESIUM 400 MG PO CAPS: 400.0000 mg | ORAL_CAPSULE | Freq: Two times a day (BID) | ORAL | Status: DC

## 2014-02-27 MED ORDER — LEVETIRACETAM 1000 MG PO TABS: 1000.0000 mg | ORAL_TABLET | Freq: Two times a day (BID) | ORAL | Status: DC

## 2014-02-27 MED ORDER — LACTULOSE 10 GM/15ML PO SOLN: 30.0000 g | Freq: Three times a day (TID) | ORAL | Status: DC

## 2014-02-27 NOTE — Clinical Social Work Psychosocial (Signed)
Clinical Social Work Department BRIEF PSYCHOSOCIAL ASSESSMENT 02/27/2014  Patient:  Miguel Hanson, Miguel Hanson     Account Number:  [id number]     Admit date:  02/25/2014  Clinical Social Worker:  Marciano Sequin  Date/Time:  02/27/2014 03:27 PM  Referred by:  RN  Date Referred:  02/27/2014 Referred for  Substance Abuse   Other Referral:   Interview type:  Patient Other interview type:    PSYCHOSOCIAL DATA Living Status:  WIFE Admitted from facility:   Level of care:   Primary support name:  Flannigan,Sandra Primary support relationship to patient:  CHILD, ADULT Degree of support available:   Strong Support    CURRENT CONCERNS Current Concerns  None Noted   Other Concerns:    SOCIAL WORK ASSESSMENT / PLAN CSW met the pt at bedside. CSW introduced self and purpose of the visit. Pt reported he currently lives at home with his wife. Pt denied ETOH use is not a problem. Pt identified himself as a social drinking. Pt expressed that he is an adult who drink responsibly. Pt reported that if his drinking becomes a problem he will get help. At this time pt declined resources.   Assessment/plan status:  No Further Intervention Required Other assessment/ plan:   SBIRT complete   Information/referral to community resources:    PATIENT'S/FAMILY'S RESPONSE TO PLAN OF CARE: Pt presented with a normal mood and affect. Pt oriented 4x. Pt deined his drinking is a problem. Pt reported that he will discharge home.    [place], MSW, [place]

## 2014-02-27 NOTE — Progress Notes (Signed)
NEURO HOSPITALIST PROGRESS NOTE   SUBJECTIVE:                                                                                                                        Patient is remarkably improved, no further seizures reported. On keppra 1 gram BID without noticeable side effects.  OBJECTIVE:                                                                                                                           Vital signs in last 24 hours: Temp:  [98.1 F (36.7 C)-98.4 F (36.9 C)] 98.3 F (36.8 C) (12/29 0500) Pulse Rate:  [78-99] 85 (12/29 0500) Resp:  [13-18] 18 (12/29 0500) BP: (133-148)/(88-97) 145/97 mmHg (12/29 0500) SpO2:  [98 %-100 %] 99 % (12/29 0500) Weight:  [53.9 kg (118 lb 13.3 oz)] 53.9 kg (118 lb 13.3 oz) (12/29 0247)  Intake/Output from previous day: 12/28 0701 - 12/29 0700 In: 360 [I.V.:360] Out: 900 [Urine:900] Intake/Output this shift:   Nutritional status: Diet Heart  Past Medical History  Diagnosis Date  . Hypertension   . Seizures   . ETOH abuse   . Drug abuse   . PTSD (post-traumatic stress disorder)   . H/O prolonged Q-T interval on ECG   . Polysubstance abuse   . Alcohol abuse   . Depression     Physical exam: pleasant male in no apparent distress. Head: normocephalic. Neck: supple, no bruits, no JVD. Cardiac: no murmurs. Lungs: clear. Abdomen: soft, no tender, no mass. Extremities: no edema.   Neurologic Exam:  Mental Status: Patient is awake, oriented place - year-month-day and situation. Comprehension, naming, and repetition intact. Follows third orders commands. Cranial Nerves: ZS:MOLMBE are equal, round, and reactive to light. VF intact  III,IV, VI: EOMI without ptosis or diploplia.  V: Facial sensation is symmetrically normal VII: Facial movement is symmetric.  VIII: hearing is intact to voice X: Uvula elevates symmetrically XI: Shoulder shrug is symmetric. XII: tongue is midline  without atrophy or fasciculations.  Motor: He moves all extremities voluntarily. Sensory: intact Deep Tendon Reflexes: 2+ and symmetric in the biceps and patellae.  Cerebellar: Normal finger to nose and heel to shin  Lab Results: Lab Results  Component Value Date/Time   CHOL  09/23/2008 06:48 PM    130        ATP III CLASSIFICATION:  <200     mg/dL   Desirable  200-239  mg/dL   Borderline High  >=240    mg/dL   High          Lipid Panel No results for input(s): CHOL, TRIG, HDL, CHOLHDL, VLDL, LDLCALC in the last 72 hours.  Studies/Results: Ct Head Wo Contrast  02/25/2014   CLINICAL DATA:  Altered mental status/confusion.  EXAM: CT HEAD WITHOUT CONTRAST  TECHNIQUE: Contiguous axial images were obtained from the base of the skull through the vertex without intravenous contrast.  COMPARISON:  11/24/2013 and 12/11/2012  FINDINGS: Ventricles and cisterns are within normal. There is mild age related atrophic change as well as chronic ischemic microvascular disease. There is no mass, mass effect, shift of midline structures or acute hemorrhage. There is no evidence of acute infarction. Mild chronic sinus inflammatory disease unchanged mucoperiosteal thickening involving the left sphenoid sinus unchanged. Old nasal bone fracture.  IMPRESSION: No acute intracranial findings.  Mild age related atrophic change in chronic ischemic microvascular disease.  Chronic sinus inflammatory disease.   Electronically Signed   By: Marin Olp [name]   On: 02/25/2014 10:26   [name] Contrast  02/25/2014   CLINICAL DATA:  68 year old male code stroke, altered mental status, weakness, with questionable hyperdense left MCA on noncontrast CT. Initial encounter.  EXAM: MRI HEAD WITHOUT CONTRAST  TECHNIQUE: Multiplanar, multiecho pulse sequences of the brain and surrounding structures were obtained without intravenous contrast.  COMPARISON:  Head CT without contrast 0955 hr the same day.  FINDINGS: Due to  patient agitation, The examination had to be discontinued prior to completion, such that only axial diffusion weighted imaging is obtained.  No restricted diffusion or evidence of acute infarction. No signal abnormality identified in the left MCA territory.  No midline shift or intracranial mass effect.  No ventriculomegaly.  IMPRESSION: Truncated exam due to patient agitation with axial diffusion-weighted imaging provided. No evidence of acute infarct.  Preliminary results discussed with Dr. Roland Rack at 1050 hrs on 02/25/2014.   Electronically Signed   By: Lars Pinks [name]   On: 02/25/2014 11:54    MEDICATIONS                                                                                                                        Scheduled: . amLODipine  5 mg Oral Daily  . folic acid  1 mg Oral Daily  . Influenza vac split quadrivalent PF  0.5 mL Intramuscular Tomorrow-1000  . lactulose  30 g Oral TID  . levETIRAcetam  1,000 mg Intravenous Q12H  . lisinopril  10 mg Oral Daily  . LORazepam  0-4 mg Intravenous Q6H   Followed by  . LORazepam  0-4 mg Intravenous Q12H  . multivitamin with minerals  1 tablet Oral Daily  . nicotine  21  mg Transdermal Daily  . pneumococcal 23 valent vaccine  0.5 mL Intramuscular Tomorrow-1000  . sertraline  200 mg Oral Daily  . sodium chloride  3 mL Intravenous Q12H  . thiamine  100 mg Oral Daily   Or  . thiamine  100 mg Intravenous Daily    ASSESSMENT/PLAN:                                                                                                           68 year old male presenting with breakthrough seizure and hepatic encephalopathy. Mental status is quite appropriate at this moment and he hasn't had further seizures. Ammonia normal Continue keppra. Lactulose as per primary team.  No further neuro recommendations at this time. Will need outpatient neurology follow up after discharge. Neurology will sign off.  Dorian Pod, MD [facility] [phone]  02/27/2014, 9:19 AM

## 2014-02-27 NOTE — Progress Notes (Signed)
Patient is discharged from room 4N30 at this time. Alert and in stable condition. IV site d/c'd. Instructions read to patient and understanding verbalized. Ambulate off unit and Bus pass given to patient.

## 2014-02-27 NOTE — Discharge Summary (Addendum)
Physician Discharge Summary  Ashok Sawaya HOZ:[id number] DOB: [birth year] DOA: 02/25/2014  PCP: [place] date: 02/25/2014 Discharge date: 02/27/2014  Time spent: 30 minutes  Recommendations for Outpatient Follow-up:  1. Follow up with [place] ASAP 2. Keppra dose increased to 1000 mg BID 3. Lactulose added for new hepatic encephalopathy  Discharge Diagnoses:    Hepatic encephalopathy-resolved   Seizure due to medication non adherence and ETOH withdrawal   Alcohol abuse   Hypertension   COPD (chronic obstructive pulmonary disease)   Chronic Qtc prolongation   Hypomagnesemia due to alcoholism   Discharge Condition: stable  Diet recommendation: Regular  Filed Weights   02/25/14 1345 02/26/14 0000 02/27/14 0247  Weight: 112 lb (50.803 kg) 115 lb 1.3 oz (52.2 kg) 118 lb 13.3 oz (53.9 kg)    History of present illness:  68 year old w/ a history of seizure disorder, alcohol abuse, and hypertension who was brought to ER with altered mental status. Unable to speak upon arrival. In the ER had generalized tonic clonic seizure. CT head was w/o acute findings. MRI no evidence of infarct. Ammonia level was 224 - alcohol level was 26.  Since admission evaluated by Neurology. No further seizures since admission. Patient admitted to daily alcohol use with abrupt cessation prior to onset of seizures.  Hospital Course:  Hepatic encephalopathy -Resolved - continue lactulose daily after dc - ammonia level down to 14  Toxic metabolic encephalopathy -Due primarily to EtOH abuse and withdrawal -resolved  Seizure -Multifactorial due to alcohol withdrawal and nonadherence to medications - Neuro following - Keppra increased to 1000 mg BID this admission -Patient counseled that mixing alcohol and his seizure medications could result in death  Alcohol abuse -Continue CIWA - continue Folvite and MVI   Hypokalemia -Replaced - due to EtOH related  malnutrition   Hypomagnesemia -Replaced- due to EtOH related malnutrition -prescribe at discharge  Hypertension -Continue lisinopril and Norvasc  COPD  -Asymptomatic  Borderline/prolonged QTc -Must avoid meds w/ potential to worsen prolonged QTc - replace Mg to goal of 2.0 or >-chronically elevated > 475 ms for > 1 year    Procedures: EEG: No evidence of seizure activity   Consultations: Neurology/Dr. Aram Beecham  Discharge Exam: Filed Vitals:   02/27/14 0950  BP: 143/92  Pulse: 95  Temp: 98.2 F (36.8 C)  Resp: 20   Gen: No acute respiratory distress - previous confusion has resolved Chest: Clear to auscultation bilaterally without wheezes, rhonchi or crackles, room air Cardiac: Regular rate and rhythm, S1-S2, no rubs murmurs or gallops Abdomen: Soft nontender nondistended without obvious hepatosplenomegaly, no ascites Extremities: no signif C/C/E B le   Discharge Instructions    Current Discharge Medication List    START taking these medications   Details  lactulose (CHRONULAC) 10 GM/15ML solution Take 45 mLs (30 g total) by mouth daily. Qty: 240 mL, Refills: 0    Magnesium 400 MG CAPS Take 400 mg by mouth 2 (two) times daily. Qty: 60 capsule, Refills: 11      CONTINUE these medications which have CHANGED   Details  levETIRAcetam (KEPPRA) 1000 MG tablet Take 1 tablet (1,000 mg total) by mouth 2 (two) times daily. Qty: 60 tablet, Refills: 0      CONTINUE these medications which have NOT CHANGED   Details  acetaminophen (TYLENOL) 500 MG tablet Take 1,000 mg by mouth every 6 (six) hours as needed for mild pain.    amLODipine (NORVASC) 5 MG  tablet Take 5 mg by mouth daily.    feeding supplement, ENSURE COMPLETE, (ENSURE COMPLETE) LIQD Take 237 mLs by mouth 2 (two) times daily between meals.    lisinopril (PRINIVIL,ZESTRIL) 10 MG tablet Take 1 tablet (10 mg total) by mouth daily. Qty: 30 tablet, Refills: 1    sertraline (ZOLOFT) 100 MG tablet Take 200 mg  by mouth daily.       No Known Allergies Follow-up Information    Follow up with [facility].   Specialty:  General Practice   Contact information:   [place] [address] [phone]        The results of significant diagnostics from this hospitalization (including imaging, microbiology, ancillary and laboratory) are listed below for reference.    Significant Diagnostic Studies: Ct Head Wo Contrast  02/25/2014   CLINICAL DATA:  Altered mental status/confusion.  EXAM: CT HEAD WITHOUT CONTRAST  TECHNIQUE: Contiguous axial images were obtained from the base of the skull through the vertex without intravenous contrast.  COMPARISON:  11/24/2013 and 12/11/2012  FINDINGS: Ventricles and cisterns are within normal. There is mild age related atrophic change as well as chronic ischemic microvascular disease. There is no mass, mass effect, shift of midline structures or acute hemorrhage. There is no evidence of acute infarction. Mild chronic sinus inflammatory disease unchanged mucoperiosteal thickening involving the left sphenoid sinus unchanged. Old nasal bone fracture.  IMPRESSION: No acute intracranial findings.  Mild age related atrophic change in chronic ischemic microvascular disease.  Chronic sinus inflammatory disease.   Electronically Signed   By: Marin Olp [name]   On: 02/25/2014 10:26   [name] Contrast  02/25/2014   CLINICAL DATA:  68 year old male code stroke, altered mental status, weakness, with questionable hyperdense left MCA on noncontrast CT. Initial encounter.  EXAM: MRI HEAD WITHOUT CONTRAST  TECHNIQUE: Multiplanar, multiecho pulse sequences of the brain and surrounding structures were obtained without intravenous contrast.  COMPARISON:  Head CT without contrast 0955 hr the same day.  FINDINGS: Due to patient agitation, The examination had to be discontinued prior to completion, such that only axial diffusion weighted imaging is obtained.  No  restricted diffusion or evidence of acute infarction. No signal abnormality identified in the left MCA territory.  No midline shift or intracranial mass effect.  No ventriculomegaly.  IMPRESSION: Truncated exam due to patient agitation with axial diffusion-weighted imaging provided. No evidence of acute infarct.  Preliminary results discussed with Dr. Roland Rack at 1050 hrs on 02/25/2014.   Electronically Signed   By: Lars Pinks [name]   On: 02/25/2014 11:54    Microbiology: Recent Results (from the past 240 hour(s))  Urine culture     Status: None (Preliminary result)   Collection Time: 02/25/14  9:48 AM  Result Value Ref Range Status   Specimen Description URINE, CLEAN CATCH  Final   Special Requests NONE  Final   Colony Count   Final    >=100,000 COLONIES/ML Performed at [facility]    Culture   Final    ESCHERICHIA COLI Performed at [facility]    Report Status PENDING  Incomplete  MRSA PCR Screening     Status: None   Collection Time: 02/25/14  1:58 PM  Result Value Ref Range Status   MRSA by PCR NEGATIVE NEGATIVE Final    Comment:        The GeneXpert MRSA Assay (FDA approved for NASAL specimens only), is one component of  a comprehensive MRSA colonization surveillance program. It is not intended to diagnose MRSA infection nor to guide or monitor treatment for MRSA infections.      Labs: Basic Metabolic Panel:  Recent Labs Lab 02/25/14 0926 02/25/14 1453 02/26/14 0530 02/26/14 1250 02/27/14 0604  NA 144  --  137  --  135  K 4.4  --  2.7* 3.3* 3.8  CL 104  --  102  --  104  CO2 20  --  27  --  25  GLUCOSE 113*  --  95  --  85  BUN 5*  --  <5*  --  <5*  CREATININE 0.54  --  0.52  --  0.51  CALCIUM 9.8  --  8.5  --  8.2*  MG  --  1.3*  --  1.8 1.8   Liver Function Tests:  Recent Labs Lab 02/25/14 0926 02/26/14 0530 02/27/14 0604  AST 71* 45* 177*  ALT 15 12 25   ALKPHOS 100 77 77  BILITOT 0.8 1.3* 1.0  PROT 8.3 6.5 6.4    ALBUMIN 4.4 3.5 3.3*   No results for input(s): LIPASE, AMYLASE in the last 168 hours.  Recent Labs Lab 02/25/14 0925 02/26/14 0530 02/27/14 0700  AMMONIA 224* 14 17   CBC:  Recent Labs Lab 02/25/14 0926 02/26/14 0530 02/27/14 0604  WBC 8.8 8.5 6.8  NEUTROABS 3.1  --  3.9  HGB 13.0 11.6* 11.0*  HCT 36.6* 32.5* 30.9*  MCV 112.3* 112.8* 110.8*  PLT 126* 102* 104*   Cardiac Enzymes:  Recent Labs Lab 02/25/14 0926  TROPONINI <0.03   BNP: BNP (last 3 results) No results for input(s): PROBNP in the last 8760 hours. CBG:  Recent Labs Lab 02/25/14 0931  GLUCAP 144*       Signed:  ELLIS,ALLISON L. ANP [facility] 02/27/2014, 1:13 PM  Examined Patient and discussed A&P with ANP Ebony Hail and agree with above plan.  I have reviewed the entire database. I have made any necessary editorial changes, and agree with its content. Pt with Multiple Complex medical problems> 40 min spent in direct Pt care    Dia Crawford, MD [facility] [phone] pager

## 2014-02-27 NOTE — Discharge Instructions (Signed)
Lactulose oral solution What is this medicine? LACTULOSE (LAK tyoo lose) is a laxative derived from lactose. It helps to treat chronic constipation and to treat or prevent hepatic encephalopathy or coma. These are brain disorders that result from liver disease. This medicine may be used for other purposes; ask your health care provider or pharmacist if you have questions. COMMON BRAND NAME(S): Acilac, Cephulac, Cholac, Chronulac, Constilac, Constulose, Enulose, Generlac What should I tell my health care provider before I take this medicine? They need to know if you have any of these conditions: -need a galactose-free diet -scheduled for surgery -an unusual or allergic reaction to lactulose, other sugars, medicines, foods, dyes or preservatives -pregnant or trying to get pregnant -breast-feeding How should I use this medicine? Take this medicine mouth. Follow the directions on the prescription label. Shake well before using. Use a specially marked spoon or container to measure your medicine. Ask your pharmacist if you do not have one. Household spoons are not accurate. Take your doses at regular intervals. Do not take your medicine more often than directed. You may be directed to take this medicine rectally. If so, you must follow specific directions from your doctor or healthcare professional. Please contact him or her. Talk to your pediatrician regarding the use of this medicine in children. Special care may be needed. Overdosage: If you think you have taken too much of this medicine contact a poison control center or emergency room at once. NOTE: This medicine is only for you. Do not share this medicine with others. What if I miss a dose? If you miss a dose, take it as soon as you can. If it is almost time for your next dose, take only that dose. Do not take double or extra doses. What may interact with this medicine? -antacids -neomycin -other laxatives This list may not describe all  possible interactions. Give your health care provider a list of all the medicines, herbs, non-prescription drugs, or dietary supplements you use. Also tell them if you smoke, drink alcohol, or use illegal drugs. Some items may interact with your medicine. What should I watch for while using this medicine? This medicine may not produce any result for 24 to 48 hours. Do not take this medicine for longer than directed by your doctor or health care professional. Drink plenty of water with each dose of this medicine. What side effects may I notice from receiving this medicine? Side effects that you should report to your doctor or health care professional as soon as possible: -diarrhea Side effects that usually do not require medical attention (report to your doctor or health care professional if they continue or are bothersome): -belching, flatulence -nausea or vomiting -stomach pain or discomfort This list may not describe all possible side effects. Call your doctor for medical advice about side effects. You may report side effects to FDA at 1-800-FDA-1088. Where should I keep my medicine? Keep out of the reach of children. This medicine may darken in color under normal storage conditions. This is because of the sugar solution and does not affect the way the medicine works. If the solution becomes extremely dark in color, contact your health care professional before use. Store at room temperature between 15 and 30 degrees C (59 and 86 degrees F). Do not freeze. Keep container tightly closed. Throw away any unused medicine after the expiration date. NOTE: This sheet is a summary. It may not cover all possible information. If you have questions about this medicine, talk to  your doctor, pharmacist, or health care provider.  2015, Elsevier/Gold Standard. (2007-08-23 16:04:57) Hepatic Encephalopathy Hepatic encephalopathy is a syndrome. This is a set of symptoms that occur together. It is seen mostly in  patients with damage to the liver known as cirrhosis. This is where normal liver tissue has been replaced by scar tissue.  Symptoms of the syndrome include:  Changes in personality.  Mental impairment.  A depressed level of consciousness. These changes occur because toxins build up in the bloodstream. The build up occurs because the scarred liver cannot rid toxins from the body. The most important of these toxins is ammonia. Toxins can cause abnormal behavior and confusion. Toxins in the blood stream can impair your ability to take care of yourself or others. Some people become very sleepy and cannot be woken easily. In severe cases, the patient lapses into a coma.  CAUSES  There are many things that can cause liver damage that can lead to buildup of toxins. These include:  Diseases that cause cirrhosis of the liver.  Long-term alcohol use with progressive liver damage.  Hepatitis B or C with ongoing infection and liver damage.  Patients without cirrhosis who have undergone shunt surgery.  Kidney failure.  Bleeding in the stomach or intestines.  Infection.  Constipation.  Medications that act upon the central nervous system.  Diuretic therapy.  Excessive dietary protein. SYMPTOMS  Symptoms of this syndrome are categorized or "staged" based on severity.   Stage 0. Minimal hepatic encephalopathy. No detectable changes in personality or behavior. Minimal changes in memory, concentration, mental function, and physical ability.  Stage 1. Some lack of awareness. Shortened attention span. Problems with addition or subtraction. Possible problems with sleeping or a reversal of the normal sleep pattern. Euphoria, depression, or irritability may be present. Mild confusion. Slowing of mental ability. Tremors may be detected.  Stage 2. Lethargy or apathy. Disoriented. Strange behavior. Slurred speech. Obvious tremors. Drowsiness, unable to perform mental tasks. Personality changes, and  confusion about time.  Stage 3. Very sleepy but can be aroused. Unable to perform mental tasks, cannot keep track of time and place, marked confusion, amnesia, occasional fits of rage, speech cannot be understood.  Stage 4. Coma with or without response to painful stimuli. DIAGNOSIS  In mild cases, a careful history and physical exam may lead your caregiver to consider possible mild hepatic encephalopathy as the cause of symptoms. The diagnosis is clearer in more severe cases. An elevated blood ammonia level is the classic blood test abnormality in patients with this syndrome. Other tests can be helpful to rule out other diseases.  TREATMENT   Medications are often used to lower the ammonia level in the blood. This usually leads to improvement.  Diets containing vegetable proteins are better than diets rich in animal protein, especially proteins derived from red meats. Eating well-cooked chicken and fish in addition to vegetable protein should be discussed with your caregiver. Malnourished patients are encouraged to add liquid nutritional supplements to their diet.  Antibiotics are sometimes used to try to lessen the volume of bacteria in the intestines that produce ammonia.  Moderate to severe cases of this syndrome usually require a hospital stay and medicine that is given directly into a vein (intravenously). HOME CARE INSTRUCTIONS  The goal at home is to avoid things that can make the condition worse and lead to a buildup of ammonia in the blood.  Eat a well balanced diet. Your caregiver can help you with suggestions on this.  Talk to your caregiver before taking vitamin supplements. Large doses of vitamins and minerals, especially vitamin A, iron, or copper, can worsen liver damage.  A low salt diet, water restriction, or diuretic medicine may be needed to reduce fluid retention.  Avoid alcohol and acetaminophen as well as any over-the-counter medications that contain acetaminophen  (check labels). Only take over-the-counter or prescription medicines for pain, discomfort, or fever as directed by your caregiver.  Avoid drugs that are toxic to the liver. Review your medications (both prescription and non-prescription) with your caregiver to make sure those you are taking will not be harmful.  Blood tests may be needed. Follow your caregiver's advice regarding the timing of these.  With this condition you play a critical role in maintaining your own good health. The failure to follow your caregiver's advice and these instructions may result in permanent disability or death. SEEK MEDICAL CARE IF:   You have increasing fatigue or weakness.  You develop increasing swelling of the abdomen, hands, feet, legs or face.  You develop loss of appetite.  You are feeling sick to your stomach (nausea) and vomiting.  You develop jaundice. This is a yellow discoloration of the skin.  You develop worsening problems with concentration, confusion, and/or problems with sleep. SEEK IMMEDIATE MEDICAL CARE IF:   You vomit bright red blood or a coffee ground-looking material.  You have blood in your stools. Or the stools turn black and tarry.  You have a fever.  You develop easy bruising or bleeding.  You have a return of slurred speech, change in behavior, or confusion. MAKE SURE YOU:   Understand these instructions.  Will watch your condition.  Will get help right away if you are not doing well or get worse. Document Released: 04/28/2006 Document Revised: 05/11/2011 Document Reviewed: 02/02/2007 [facility] Patient Information 2015 [place], [place]. This information is not intended to replace advice given to you by your health care provider. Make sure you discuss any questions you have with your health care provider. Magnesium This is a blood test which measures the amount of magnesium in your blood. Most of the magnesium in your body exists in your cells. However, the magnesium in  your blood is important for many processes. It is important for your nerves to be able to conduct electrical energy. This is important in heart patients with higher heartbeats. When your heart beats and then gets ready to beat again, it repolarizes. If the magnesium levels are low or high, it can affect the accuracy of the repolarization process. The magnesium levels are also monitored during pregnancy. This is done to determine if the expectant mother may have preeclampsia or toxemia. Magnesium is often used for treatment of these problems. PREPARATION FOR TEST No preparation or fasting is needed. A blood sample may be taken by inserting a needle into a vein in the arm.  NORMAL FINDINGS  Adult: 1.3 to 2.1 mEq/L or 0.65 to 1.05 mmol/L (SI units)  Child: 1.4 to 1.7 mEq/L  Newborn: 1.4 to 2 mEq/L Possible critical values: less than 0.5 mEq/L or greater than 3 mEq/L Ranges for normal findings may vary among different laboratories and hospitals. You should always check with your caregiver after having lab work or other tests done to discuss the meaning of your test results and whether your values are considered within normal limits. MEANING OF TEST  Your caregiver will go over the test results with you. Your caregiver will discuss the importance and meaning of your results. He  or she will also discuss treatment options and additional tests, if needed. OBTAINING THE TEST RESULTS It is your responsibility to obtain your test results. Ask the lab or department performing the test when and how you will get your results. Document Released: 03/21/2004 Document Revised: 05/11/2011 Document Reviewed: 01/28/2008 [facility] Patient Information 2015 [place], [place]. This information is not intended to replace advice given to you by your health care provider. Make sure you discuss any questions you have with your health care provider.

## 2014-02-27 NOTE — Progress Notes (Signed)
CM CONSULT Talked to patient about DCP; patient lives with his spouse- she has MS and is wheelchair bound; he gets all of his medication through the [place] [facility] and is seen by the MD at the [place] every 3 months; CM received a referral stating that he wanted [facility] ( a personal care service)- pt does not know anything about that and does not want me to talk to anyone else in the family but him; Patient states that he is independent of all of his ADL's and takes the bus as a means of transportation; CM will continue to follow for DCP; Mindi Slicker RN,BSN,MHA [phone]

## 2014-02-27 NOTE — Progress Notes (Signed)
Pt arrived to 4N30 from [place]. Pt's vitals signs stable. Pt oriented to room and call-bell. Pt resting comfortably and in no acute distress.

## 2014-05-08 ENCOUNTER — Encounter (HOSPITAL_COMMUNITY): Payer: Self-pay | Admitting: Emergency Medicine

## 2014-05-08 ENCOUNTER — Inpatient Hospital Stay (HOSPITAL_COMMUNITY): Payer: Medicare Other

## 2014-05-08 ENCOUNTER — Inpatient Hospital Stay (HOSPITAL_COMMUNITY)
Admission: EM | Admit: 2014-05-08 | Discharge: 2014-05-21 | DRG: 896 | Disposition: A | Discharge status: Skilled Nursing Facility | Payer: Medicare Other | Attending: Internal Medicine | Admitting: Internal Medicine

## 2014-05-08 ENCOUNTER — Emergency Department (HOSPITAL_COMMUNITY): Payer: Medicare Other

## 2014-05-08 DIAGNOSIS — G8384 Todd's paralysis (postepileptic): Secondary | ICD-10-CM | POA: Diagnosis present

## 2014-05-08 DIAGNOSIS — I5032 Chronic diastolic (congestive) heart failure: Secondary | ICD-10-CM | POA: Diagnosis present

## 2014-05-08 DIAGNOSIS — F4310 Post-traumatic stress disorder, unspecified: Secondary | ICD-10-CM | POA: Diagnosis present

## 2014-05-08 DIAGNOSIS — N390 Urinary tract infection, site not specified: Secondary | ICD-10-CM | POA: Diagnosis present

## 2014-05-08 DIAGNOSIS — F329 Major depressive disorder, single episode, unspecified: Secondary | ICD-10-CM | POA: Diagnosis present

## 2014-05-08 DIAGNOSIS — R410 Disorientation, unspecified: Secondary | ICD-10-CM | POA: Insufficient documentation

## 2014-05-08 DIAGNOSIS — D539 Nutritional anemia, unspecified: Secondary | ICD-10-CM | POA: Diagnosis present

## 2014-05-08 DIAGNOSIS — Y900 Blood alcohol level of less than 20 mg/100 ml: Secondary | ICD-10-CM | POA: Diagnosis present

## 2014-05-08 DIAGNOSIS — F1026 Alcohol dependence with alcohol-induced persisting amnestic disorder: Principal | ICD-10-CM | POA: Diagnosis present

## 2014-05-08 DIAGNOSIS — F10231 Alcohol dependence with withdrawal delirium: Secondary | ICD-10-CM | POA: Diagnosis present

## 2014-05-08 DIAGNOSIS — R627 Adult failure to thrive: Secondary | ICD-10-CM | POA: Diagnosis not present

## 2014-05-08 DIAGNOSIS — F1010 Alcohol abuse, uncomplicated: Secondary | ICD-10-CM | POA: Diagnosis present

## 2014-05-08 DIAGNOSIS — R4701 Aphasia: Secondary | ICD-10-CM | POA: Diagnosis not present

## 2014-05-08 DIAGNOSIS — D6959 Other secondary thrombocytopenia: Secondary | ICD-10-CM | POA: Diagnosis present

## 2014-05-08 DIAGNOSIS — G9340 Encephalopathy, unspecified: Secondary | ICD-10-CM | POA: Diagnosis present

## 2014-05-08 DIAGNOSIS — F17210 Nicotine dependence, cigarettes, uncomplicated: Secondary | ICD-10-CM | POA: Diagnosis present

## 2014-05-08 DIAGNOSIS — E876 Hypokalemia: Secondary | ICD-10-CM | POA: Diagnosis not present

## 2014-05-08 DIAGNOSIS — I4581 Long QT syndrome: Secondary | ICD-10-CM | POA: Diagnosis not present

## 2014-05-08 DIAGNOSIS — J449 Chronic obstructive pulmonary disease, unspecified: Secondary | ICD-10-CM | POA: Diagnosis present

## 2014-05-08 DIAGNOSIS — I6789 Other cerebrovascular disease: Secondary | ICD-10-CM

## 2014-05-08 DIAGNOSIS — I10 Essential (primary) hypertension: Secondary | ICD-10-CM | POA: Diagnosis present

## 2014-05-08 DIAGNOSIS — F10931 Alcohol use, unspecified with withdrawal delirium: Secondary | ICD-10-CM | POA: Insufficient documentation

## 2014-05-08 DIAGNOSIS — R9431 Abnormal electrocardiogram [ECG] [EKG]: Secondary | ICD-10-CM | POA: Diagnosis present

## 2014-05-08 DIAGNOSIS — R4182 Altered mental status, unspecified: Secondary | ICD-10-CM | POA: Diagnosis present

## 2014-05-08 DIAGNOSIS — G40909 Epilepsy, unspecified, not intractable, without status epilepticus: Secondary | ICD-10-CM

## 2014-05-08 LAB — COMPREHENSIVE METABOLIC PANEL
ALT: 34 U/L (ref 0–53)
AST: 105 U/L — AB (ref 0–37)
Albumin: 3.6 g/dL (ref 3.5–5.2)
Alkaline Phosphatase: 89 U/L (ref 39–117)
Anion gap: 10 (ref 5–15)
BUN: 10 mg/dL (ref 6–23)
CALCIUM: 9 mg/dL (ref 8.4–10.5)
CO2: 26 mmol/L (ref 19–32)
Chloride: 100 mmol/L (ref 96–112)
Creatinine, Ser: 0.64 mg/dL (ref 0.50–1.35)
GFR calc non Af Amer: >90 mL/min (ref >90)
Glucose, Bld: 95 mg/dL (ref 70–99)
Potassium: 4 mmol/L (ref 3.5–5.1)
Sodium: 136 mmol/L (ref 135–145)
TOTAL PROTEIN: 6.8 g/dL (ref 6.0–8.3)
Total Bilirubin: 0.7 mg/dL (ref 0.3–1.2)

## 2014-05-08 LAB — I-STAT CHEM 8, ED
BUN: 9 mg/dL (ref 6–23)
Calcium, Ion: 1.11 mmol/L — ABNORMAL LOW (ref 1.13–1.30)
Chloride: 99 mmol/L (ref 96–112)
Creatinine, Ser: 0.6 mg/dL (ref 0.50–1.35)
GLUCOSE: 96 mg/dL (ref 70–99)
HCT: 36 % — ABNORMAL LOW (ref 39.0–52.0)
HEMOGLOBIN: 12.2 g/dL — AB (ref 13.0–17.0)
Potassium: 4 mmol/L (ref 3.5–5.1)
SODIUM: 136 mmol/L (ref 135–145)
TCO2: 22 mmol/L (ref 0–100)

## 2014-05-08 LAB — RAPID URINE DRUG SCREEN, HOSP PERFORMED
Amphetamines: NOT DETECTED
Barbiturates: NOT DETECTED
Benzodiazepines: POSITIVE — AB
Cocaine: NOT DETECTED
Opiates: NOT DETECTED
Tetrahydrocannabinol: POSITIVE — AB

## 2014-05-08 LAB — PROTIME-INR
INR: 1.1 (ref 0.00–1.49)
PROTHROMBIN TIME: 14.3 s (ref 11.6–15.2)

## 2014-05-08 LAB — AMMONIA: Ammonia: 24 umol/L (ref 11–32)

## 2014-05-08 LAB — CBC
HCT: 31 % — ABNORMAL LOW (ref 39.0–52.0)
Hemoglobin: 11.2 g/dL — ABNORMAL LOW (ref 13.0–17.0)
MCH: 38.8 pg — ABNORMAL HIGH (ref 26.0–34.0)
MCHC: 36.1 g/dL — AB (ref 30.0–36.0)
MCV: 107.3 fL — ABNORMAL HIGH (ref 78.0–100.0)
PLATELETS: 99 10*3/uL — AB (ref 150–400)
RBC: 2.89 MIL/uL — ABNORMAL LOW (ref 4.22–5.81)
RDW: 19.2 % — ABNORMAL HIGH (ref 11.5–15.5)
WBC: 7.4 10*3/uL (ref 4.0–10.5)

## 2014-05-08 LAB — URINE MICROSCOPIC-ADD ON

## 2014-05-08 LAB — URINALYSIS, ROUTINE W REFLEX MICROSCOPIC
Bilirubin Urine: NEGATIVE
Glucose, UA: NEGATIVE mg/dL
Hgb urine dipstick: NEGATIVE
Ketones, ur: 15 mg/dL — AB
Nitrite: NEGATIVE
Protein, ur: NEGATIVE mg/dL
Specific Gravity, Urine: 1.025 (ref 1.005–1.030)
Urobilinogen, UA: 0.2 mg/dL (ref 0.0–1.0)
pH: 7 (ref 5.0–8.0)

## 2014-05-08 LAB — DIFFERENTIAL
BASOS ABS: 0.1 10*3/uL (ref 0.0–0.1)
BASOS PCT: 1 % (ref 0–1)
EOS ABS: 0.6 10*3/uL (ref 0.0–0.7)
EOS PCT: 8 % — AB (ref 0–5)
LYMPHS PCT: 24 % (ref 12–46)
Lymphs Abs: 1.8 10*3/uL (ref 0.7–4.0)
MONO ABS: 0.3 10*3/uL (ref 0.1–1.0)
MONOS PCT: 3 % (ref 3–12)
Neutro Abs: 4.8 10*3/uL (ref 1.7–7.7)
Neutrophils Relative %: 64 % (ref 43–77)

## 2014-05-08 LAB — ETHANOL

## 2014-05-08 LAB — APTT: APTT: 34 s (ref 24–37)

## 2014-05-08 LAB — TSH: TSH: 0.691 u[IU]/mL (ref 0.350–4.500)

## 2014-05-08 LAB — PHENYTOIN LEVEL, TOTAL: Phenytoin Lvl: <2.5 ug/mL — ABNORMAL LOW (ref 10.0–20.0)

## 2014-05-08 LAB — I-STAT TROPONIN, ED: TROPONIN I, POC: 0 ng/mL (ref 0.00–0.08)

## 2014-05-08 LAB — MAGNESIUM: Magnesium: 1.3 mg/dL — ABNORMAL LOW (ref 1.5–2.5)

## 2014-05-08 LAB — MRSA PCR SCREENING: MRSA by PCR: NEGATIVE

## 2014-05-08 LAB — CBG MONITORING, ED: GLUCOSE-CAPILLARY: 118 mg/dL — AB (ref 70–99)

## 2014-05-08 MED ORDER — THIAMINE HCL 100 MG/ML IJ SOLN
100.0000 mg | Freq: Every day | INTRAMUSCULAR | Status: DC
  Administered 2014-05-09 – 2014-05-16 (×8): 100 mg via INTRAVENOUS
  Filled 2014-05-08 (×8): qty 1

## 2014-05-08 MED ORDER — ADULT MULTIVITAMIN W/MINERALS CH: 1.0000 | ORAL_TABLET | Freq: Every day | ORAL | Status: DC

## 2014-05-08 MED ORDER — SENNOSIDES-DOCUSATE SODIUM 8.6-50 MG PO TABS: 1.0000 | ORAL_TABLET | Freq: Every evening | ORAL | Status: DC | PRN

## 2014-05-08 MED ORDER — LORAZEPAM 1 MG PO TABS: 1.0000 mg | ORAL_TABLET | Freq: Four times a day (QID) | ORAL | Status: DC | PRN

## 2014-05-08 MED ORDER — METOPROLOL TARTRATE 1 MG/ML IV SOLN: 5.0000 mg | INTRAVENOUS | Status: DC | PRN

## 2014-05-08 MED ORDER — STROKE: EARLY STAGES OF RECOVERY BOOK
Freq: Once | Status: DC
Start: 2014-05-08 — End: 2014-05-09
  Filled 2014-05-08: qty 1

## 2014-05-08 MED ORDER — VITAMIN B-1 100 MG PO TABS
100.0000 mg | ORAL_TABLET | Freq: Every day | ORAL | Status: DC
  Administered 2014-05-08: 100 mg via ORAL
  Filled 2014-05-08 (×2): qty 1

## 2014-05-08 MED ORDER — LEVETIRACETAM IN NACL 500 MG/100ML IV SOLN
500.0000 mg | Freq: Two times a day (BID) | INTRAVENOUS | Status: DC
Start: 2014-05-08 — End: 2014-05-10
  Administered 2014-05-08 – 2014-05-10 (×3): 500 mg via INTRAVENOUS
  Filled 2014-05-08 (×5): qty 100

## 2014-05-08 MED ORDER — SODIUM CHLORIDE 0.9 % IV SOLN
INTRAVENOUS | Status: DC
  Administered 2014-05-08 – 2014-05-09 (×2): via INTRAVENOUS

## 2014-05-08 MED ORDER — ASPIRIN 300 MG RE SUPP
300.0000 mg | Freq: Every day | RECTAL | Status: DC
  Administered 2014-05-08: 300 mg via RECTAL
  Filled 2014-05-08 (×2): qty 1

## 2014-05-08 MED ORDER — LORAZEPAM 2 MG/ML IJ SOLN
1.0000 mg | Freq: Four times a day (QID) | INTRAMUSCULAR | Status: DC | PRN
  Administered 2014-05-09 (×2): 1 mg via INTRAVENOUS
  Filled 2014-05-08 (×2): qty 1

## 2014-05-08 MED ORDER — FOLIC ACID 1 MG PO TABS: 1.0000 mg | ORAL_TABLET | Freq: Every day | ORAL | Status: DC

## 2014-05-08 MED ORDER — FOLIC ACID 5 MG/ML IJ SOLN
1.0000 mg | Freq: Every day | INTRAMUSCULAR | Status: DC
  Administered 2014-05-08 – 2014-05-16 (×9): 1 mg via INTRAVENOUS
  Filled 2014-05-08 (×11): qty 0.2

## 2014-05-08 NOTE — ED Notes (Signed)
Attempted report X1

## 2014-05-08 NOTE — ED Notes (Signed)
Patient transported to MRI 

## 2014-05-08 NOTE — Progress Notes (Signed)
Echocardiogram 2D Echocardiogram has been performed.  Miguel Hanson 05/08/2014, 1:55 PM

## 2014-05-08 NOTE — ED Notes (Signed)
Last seen normal was last night, wife is bed ridden.  All she knows is that patient has been heavily drinking lately and was drinking last night and he went to bed.  Only medication bottles that could be found were from 2003.

## 2014-05-08 NOTE — Progress Notes (Signed)
EEG Completed; Results Pending  

## 2014-05-08 NOTE — ED Notes (Signed)
Medicine at bedside.

## 2014-05-08 NOTE — Consult Note (Addendum)
Referring Physician: Dr. Sabra Heck    Chief Complaint: confusion, garbled speech, seizure like activity but only in the right side.  HPI:                                                                                                                                         Miguel Hanson is an 69 y.o. male with a past medical history significant for HTN, polysubstance abuse, depression, PTSD,recent diagnosis of hepatic encephalopathy for which she was admitted to the hospital in December 2015, and seizures, brought in via EMS due to acute onset of the above stated symptoms. Patient is currently confuse, tends to perseverate, and can not contribute to his clinical infromation. As per chart review, " Paramedics report that the patient was found this morning having difficulty speaking, expressive aphasia was present, there was apparently some right-sided arm and leg weakness, he did have witnessed myoclonic jerking of his facial muscles on the right, right arm and right leg in the ambulance that lasted over 5 minutes, he was given Versed and this resolved, he was able to follow commands with the left hand during this time. The patient is unable to give me any history as he does have an expressive aphasia at this time. Family members found the patient this morning and state that he has had increased alcohol use recently". Takes keppra 1 gram BID. CT brain was personally reviewed and showed no acute abnormality. Serologies: ETOH level < 5, normal white count, platelets 99, unimpressive metabolic panel including normal ammonia. UA pending.  Date last known well: uncertain Time last known well: uncertain tPA Given: no, out of the window   Past Medical History  Diagnosis Date  . Hypertension   . Seizures   . ETOH abuse   . Drug abuse   . PTSD (post-traumatic stress disorder)   . H/O prolonged Q-T interval on ECG   . Polysubstance abuse   . Alcohol abuse   . Depression     Past Surgical History   Procedure Laterality Date  . No past surgeries      No family history on file. Social History:  reports that he has been smoking Cigarettes.  He has a 24.5 pack-year smoking history. He uses smokeless tobacco. He reports that he drinks about 21.6 oz of alcohol per week. He reports that he uses illicit drugs (Marijuana).  Allergies: No Known Allergies  Medications:  I have reviewed the patient's current medications.  ROS: unable to obtain due to mental status                                                                                               History obtained from chart review  Physical exam: pleasant male in no apparent distress. Blood pressure 148/94, pulse 74, temperature 98.6 F (37 C), temperature source Oral, resp. rate 19, height _0  (1.676 m), weight 54.432 kg (120 lb), SpO2 98 % Head: normocephalic. Neck: supple, no bruits, no JVD. Cardiac: no murmurs. Lungs: clear. Abdomen: soft, no tender, no mass. Extremities: no edema. Skin: no rash Neurologic Examination:                                                                                                      General: Mental Status: Alert and awake, perseverates, follows simple commands but inconsistently.  Cranial Nerves: II: Discs were not assesed; Visual fields grossly normal, pupils equal, round, reactive to light and accommodation III,IV, VI: ptosis not present, extra-ocular motions intact bilaterally V,VII: smile symmetric, facial light touch sensation normal bilaterally VIII: hearing normal bilaterally IX,X: uvula rises symmetrically XI: bilateral shoulder shrug XII: midline tongue extension without atrophy or fasciculations Motor: Moves all limbs symmetrically Sensory: Pinprick and light touch intact unreliable due to confusion Deep Tendon Reflexes:  2 in the upper extremities,  1+ patella bilaterally.  Plantars: Right: downgoing   Left: downgoing Cerebellar: Unable to test as patient is not following commands consistently. Gait:  No tested due to safety reasons.    Results for orders placed or performed during the hospital encounter of 05/08/14 (from the past 48 hour(s))  Ethanol     Status: None   Collection Time: 05/08/14  8:45 AM  Result Value Ref Range   Alcohol, Ethyl (B) <5 0 - 9 mg/dL    Comment:        LOWEST DETECTABLE LIMIT FOR SERUM ALCOHOL IS 11 mg/dL FOR MEDICAL PURPOSES ONLY   Protime-INR     Status: None   Collection Time: 05/08/14  8:45 AM  Result Value Ref Range   Prothrombin Time 14.3 11.6 - 15.2 seconds   INR 1.10 0.00 - 1.49  APTT     Status: None   Collection Time: 05/08/14  8:45 AM  Result Value Ref Range   aPTT 34 24 - 37 seconds  CBC     Status: Abnormal   Collection Time: 05/08/14  8:45 AM  Result Value Ref Range   WBC 7.4 4.0 - 10.5 K/uL   RBC 2.89 (L) 4.22 - 5.81 MIL/uL   Hemoglobin 11.2 (L) 13.0 - 17.0 g/dL   HCT 31.0 (L)  39.0 - 52.0 %   MCV 107.3 (H) 78.0 - 100.0 fL   MCH 38.8 (H) 26.0 - 34.0 pg   MCHC 36.1 (H) 30.0 - 36.0 g/dL   RDW 19.2 (H) 11.5 - 15.5 %   Platelets 99 (L) 150 - 400 K/uL    Comment: PLATELET COUNT CONFIRMED BY SMEAR  Differential     Status: Abnormal   Collection Time: 05/08/14  8:45 AM  Result Value Ref Range   Neutrophils Relative % 64 43 - 77 %   Neutro Abs 4.8 1.7 - 7.7 K/uL   Lymphocytes Relative 24 12 - 46 %   Lymphs Abs 1.8 0.7 - 4.0 K/uL   Monocytes Relative 3 3 - 12 %   Monocytes Absolute 0.3 0.1 - 1.0 K/uL   Eosinophils Relative 8 (H) 0 - 5 %   Eosinophils Absolute 0.6 0.0 - 0.7 K/uL   Basophils Relative 1 0 - 1 %   Basophils Absolute 0.1 0.0 - 0.1 K/uL  Comprehensive metabolic panel     Status: Abnormal   Collection Time: 05/08/14  8:45 AM  Result Value Ref Range   Sodium 136 135 - 145 mmol/L   Potassium 4.0 3.5 - 5.1 mmol/L   Chloride 100 96 - 112 mmol/L   CO2 26 19 - 32  mmol/L   Glucose, Bld 95 70 - 99 mg/dL   BUN 10 6 - 23 mg/dL   Creatinine, Ser 0.64 0.50 - 1.35 mg/dL   Calcium 9.0 8.4 - 10.5 mg/dL   Total Protein 6.8 6.0 - 8.3 g/dL   Albumin 3.6 3.5 - 5.2 g/dL   AST 105 (H) 0 - 37 U/L   ALT 34 0 - 53 U/L   Alkaline Phosphatase 89 39 - 117 U/L   Total Bilirubin 0.7 0.3 - 1.2 mg/dL   GFR calc non Af Amer >90 >90 mL/min   GFR calc Af Amer >90 >90 mL/min    Comment: (NOTE) The eGFR has been calculated using the CKD EPI equation. This calculation has not been validated in all clinical situations. eGFR's persistently <90 mL/min signify possible Chronic Kidney Disease.    Anion gap 10 5 - 15  Ammonia     Status: None   Collection Time: 05/08/14  8:45 AM  Result Value Ref Range   Ammonia 24 11 - 32 umol/L  Phenytoin level, total     Status: Abnormal   Collection Time: 05/08/14  8:45 AM  Result Value Ref Range   Phenytoin Lvl <2.5 (L) 10.0 - 20.0 ug/mL  I-Stat Chem 8, ED     Status: Abnormal   Collection Time: 05/08/14  8:55 AM  Result Value Ref Range   Sodium 136 135 - 145 mmol/L   Potassium 4.0 3.5 - 5.1 mmol/L   Chloride 99 96 - 112 mmol/L   BUN 9 6 - 23 mg/dL   Creatinine, Ser 0.60 0.50 - 1.35 mg/dL   Glucose, Bld 96 70 - 99 mg/dL   Calcium, Ion 1.11 (L) 1.13 - 1.30 mmol/L   TCO2 22 0 - 100 mmol/L   Hemoglobin 12.2 (L) 13.0 - 17.0 g/dL   HCT 36.0 (L) 39.0 - 52.0 %  I-Stat Troponin, ED (not at [facility])     Status: None   Collection Time: 05/08/14  9:06 AM  Result Value Ref Range   Troponin i, poc 0.00 0.00 - 0.08 ng/mL   Comment 3            Comment: Due  to the release kinetics of cTnI, a negative result within the first hours of the onset of symptoms does not rule out myocardial infarction with certainty. If myocardial infarction is still suspected, repeat the test at appropriate intervals.   CBG monitoring, ED     Status: Abnormal   Collection Time: 05/08/14  9:22 AM  Result Value Ref Range   Glucose-Capillary 118 (H) 70 - 99 mg/dL    Ct Head Wo Contrast  05/08/2014   CLINICAL DATA:  69 year old male with seizure-like activity on right side. History seizures. Slurred speech. Alcohol consumption last evening. History of drug use and alcohol abuse. Initial encounter.  EXAM: CT HEAD WITHOUT CONTRAST  TECHNIQUE: Contiguous axial images were obtained from the base of the skull through the vertex without intravenous contrast.  COMPARISON:  02/25/2014 brain MR and head CT.  FINDINGS: No intracranial hemorrhage.  Small vessel disease type changes without CT evidence of large acute infarct.  Global atrophy without hydrocephalus.  No intracranial mass lesion noted on this unenhanced exam.  Vascular calcifications.  Left sphenoid sinus chronic sinusitis type changes. Additionally air-fluid level raise possibility of acute sinusitis.  Complete opacification left frontal sinus without bony expansion. Appearance unchanged.  IMPRESSION: No intracranial hemorrhage.  Small vessel disease type changes without CT evidence of large acute infarct.  Global atrophy without hydrocephalus.  Left sphenoid sinus chronic sinusitis type changes. Additionally air-fluid level raise possibility of acute sinusitis.  Complete opacification left frontal sinus without bony expansion. Appearance unchanged.   Electronically Signed   By: Genia Del [name]   On: 05/08/2014 10:00    Assessment: 69 y.o. male brought in with confusion, right sided weakness (resolved), witnessed myoclonic jerking of his facial muscles on the right, right arm and right leg in the ambulance that lasted over 5 minutes, trouble speaking. CT brain unremarkable for acute abnormality. Unsure if he is dysphasic or just confused. Nothing remarkable in his serologies but UA and UDS pending. Concern for new stroke, less likely subclinical seizures. Recommend admission to medicine, MRI and EEG. Further neuro testing pending results of MRI. Continue keppra 1 gram BID. Will follow up.  Dorian Pod,  MD [facility] [phone]  05/08/2014, 11:52 AM

## 2014-05-08 NOTE — H&P (Signed)
[facility] History and Physical                                                                                    Miguel Hanson, is a 69 y.o. male  MRN: [id number]   DOB - May 04, 1945  Admit Date - 05/08/2014  Outpatient Primary MD for the patient is [facility]  With History of -  Past Medical History  Diagnosis Date  . Hypertension   . Seizures   . ETOH abuse   . Drug abuse   . PTSD (post-traumatic stress disorder)   . H/O prolonged Q-T interval on ECG   . Polysubstance abuse   . Alcohol abuse   . Depression       Past Surgical History  Procedure Laterality Date  . No past surgeries      in for   Chief Complaint  Patient presents with  . Seizures  . Altered Mental Status  . Aphasia     HPI Miguel Hanson  is a 69 y.o. male, with known history of ongoing alcohol abuse as well as occasional use of marijuana, he also has hypertension and seizure disorder (most likely related to alcohol withdrawal), recent admission for hepatic encephalopathy as well as ongoing issues related to depression and PTSD. He was sent to the ER by his family via EMS after they noticed he was confused with garbled speech and focal seizure-like activity on the right. Upon arrival of EMS to the scene they noticed he was having difficulty speaking consistant with expressive aphasia and appeared to have some right arm/leg weakness as well as witnessed myoclonic jerking of his facial muscles. Also he developed myoclonic jerking of the right arm and right leg in the ambulance which lasted over 5 minutes. He was given Versed and this resolved. He had no difficulty following commands with the left side prior to receipt of Versed. According to family members who were questioned at the scene by the paramedics, the patient had apparently increased his alcohol use recently and last consumed alcohol yesterday evening. Patient is on Keppra at home; the doses listed as 1000 mg but pharmacy  medication reconciliation reports patient only takes 500 mg twice a day. Because of expressive aphasia it has been quite difficult to obtain history from this patient and the family has not been at the bedside since arrival.  Upon arrival to the ER he has been evaluated by the emergency room physician as well as by neurology. CT of the head was without any acute abnormality. Alcohol level was less than 5, platelets were 99,000, ammonia level was normal. Urinalysis was pending.  Upon my evaluation of the patient again there is no family at the bedside and because of the patient's mentation he is unable to provide appropriate history. When I entered the room and spoke to him he seemed at first to be improving saying hello but then afterwards all questions asked of him resulted in answers that were inappropriate; his speech was garbled with incomprehensible words.  Review of Systems   In addition to the HPI above,  Unable to obtain due to the patient's inability to verbally respond  to questions answered in setting of expressive aphasia--history obtained from the old medical records  *A full 10 point Review of Systems was done, except as stated above, all other Review of Systems were negative.  Social History History  Substance Use Topics  . Smoking status: Current Some Day Smoker -- 0.50 packs/day for 49 years    Types: Cigarettes  . Smokeless tobacco: Current User  . Alcohol Use: 21.6 oz/week    36 Shots of liquor per week     Comment: 03/01/2013 "drink a pint of brandy q 2 days"    Family History No previously recorded family medical history and patient unable to contribute at this time due to expressive aphasia  Prior to Admission medications   Medication Sig Start Date End Date Taking? Authorizing Provider  levETIRAcetam (KEPPRA) 1000 MG tablet Take 1 tablet (1,000 mg total) by mouth 2 (two) times daily. Patient taking differently: Take 500 mg by mouth 2 (two) times daily.  02/27/14   Yes Samella Parr, NP  lisinopril (PRINIVIL,ZESTRIL) 10 MG tablet Take 1 tablet (10 mg total) by mouth daily. 03/04/13  Yes Wilber Oliphant, MD  acetaminophen (TYLENOL) 500 MG tablet Take 1,000 mg by mouth every 6 (six) hours as needed for mild pain.    Historical Provider, MD  amLODipine (NORVASC) 5 MG tablet Take 5 mg by mouth daily.    Historical Provider, MD  feeding supplement, ENSURE COMPLETE, (ENSURE COMPLETE) LIQD Take 237 mLs by mouth 2 (two) times daily between meals. 03/04/13   Wilber Oliphant, MD  lactulose (CHRONULAC) 10 GM/15ML solution Take 45 mLs (30 g total) by mouth daily. 02/27/14   Samella Parr, NP  Magnesium 400 MG CAPS Take 400 mg by mouth 2 (two) times daily. 02/27/14   Samella Parr, NP  sertraline (ZOLOFT) 100 MG tablet Take 200 mg by mouth daily. 07/07/11   Robbie Lis, MD    No Known Allergies  Physical Exam  Vitals  Blood pressure 131/80, pulse 79, temperature 99 F (37.2 C), temperature source Oral, resp. rate 10, height 5\' 6"  (1.676 m), weight 120 lb (54.432 kg), SpO2 97 %.   General:  In no acute distress, appears healthy and well nourished  Psych:  Normal affect, Denies Suicidal or Homicidal ideations, Awake Alert, Oriented X 3. Speech and thought patterns are clear and appropriate, no apparent short term memory deficits  Neuro:   No focal neurological deficits, CN II through XII intact, Strength 5/5 all 4 extremities, Sensation intact all 4 extremities.  ENT:  Ears and Eyes appear Normal, Conjunctivae clear, PER. Moist oral mucosa without erythema or exudates.  Neck:  Supple, No lymphadenopathy appreciated  Respiratory:  Symmetrical chest wall movement, Good air movement bilaterally, CTAB. Room Air  Cardiac:  RRR, No Murmurs, no LE edema noted, no JVD, No carotid bruits, peripheral pulses palpable at 2+  Abdomen:  Positive bowel sounds, Soft, Non tender, Non distended,  No masses appreciated, no obvious hepatosplenomegaly  Skin:  No Cyanosis,  Normal Skin Turgor, No Skin Rash or Bruise.  Extremities: Symmetrical without obvious trauma or injury,  no effusions.  Data Review  CBC  Recent Labs Lab 05/08/14 0845 05/08/14 0855  WBC 7.4  --   HGB 11.2* 12.2*  HCT 31.0* 36.0*  PLT 99*  --   MCV 107.3*  --   MCH 38.8*  --   MCHC 36.1*  --   RDW 19.2*  --   LYMPHSABS 1.8  --  MONOABS 0.3  --   EOSABS 0.6  --   BASOSABS 0.1  --     Chemistries   Recent Labs Lab 05/08/14 0845 05/08/14 0855  NA 136 136  K 4.0 4.0  CL 100 99  CO2 26  --   GLUCOSE 95 96  BUN 10 9  CREATININE 0.64 0.60  CALCIUM 9.0  --   AST 105*  --   ALT 34  --   ALKPHOS 89  --   BILITOT 0.7  --     estimated creatinine clearance is 67.1 mL/min (by C-G formula based on Cr of 0.6).  No results for input(s): TSH, T4TOTAL, T3FREE, THYROIDAB in the last 72 hours.  Invalid input(s): FREET3  Coagulation profile  Recent Labs Lab 05/08/14 0845  INR 1.10    No results for input(s): DDIMER in the last 72 hours.  Cardiac Enzymes No results for input(s): CKMB, TROPONINI, MYOGLOBIN in the last 168 hours.  Invalid input(s): CK  Invalid input(s): POCBNP  Urinalysis    Component Value Date/Time   COLORURINE YELLOW 02/25/2014 0948   APPEARANCEUR CLOUDY* 02/25/2014 0948   LABSPEC 1.011 02/25/2014 0948   PHURINE 6.5 02/25/2014 0948   GLUCOSEU NEGATIVE 02/25/2014 0948   HGBUR SMALL* 02/25/2014 0948   BILIRUBINUR NEGATIVE 02/25/2014 0948   KETONESUR NEGATIVE 02/25/2014 0948   PROTEINUR 100* 02/25/2014 0948   UROBILINOGEN 0.2 02/25/2014 0948   NITRITE NEGATIVE 02/25/2014 0948   LEUKOCYTESUR NEGATIVE 02/25/2014 0948    Imaging results:   Ct Head Wo Contrast  05/08/2014   CLINICAL DATA:  69 year old male with seizure-like activity on right side. History seizures. Slurred speech. Alcohol consumption last evening. History of drug use and alcohol abuse. Initial encounter.  EXAM: CT HEAD WITHOUT CONTRAST  TECHNIQUE: Contiguous axial images  were obtained from the base of the skull through the vertex without intravenous contrast.  COMPARISON:  02/25/2014 brain MR and head CT.  FINDINGS: No intracranial hemorrhage.  Small vessel disease type changes without CT evidence of large acute infarct.  Global atrophy without hydrocephalus.  No intracranial mass lesion noted on this unenhanced exam.  Vascular calcifications.  Left sphenoid sinus chronic sinusitis type changes. Additionally air-fluid level raise possibility of acute sinusitis.  Complete opacification left frontal sinus without bony expansion. Appearance unchanged.  IMPRESSION: No intracranial hemorrhage.  Small vessel disease type changes without CT evidence of large acute infarct.  Global atrophy without hydrocephalus.  Left sphenoid sinus chronic sinusitis type changes. Additionally air-fluid level raise possibility of acute sinusitis.  Complete opacification left frontal sinus without bony expansion. Appearance unchanged.   Electronically Signed   By: Genia Del [name]   On: 05/08/2014 10:00     EKG: Sinus tachycardia with prolonged QTC 117 ms   Assessment & Plan  Principal Problem:   Expressive aphasia/altered mental status -Admit to stepdown -Appreciate neurology assistance -Agree that symptoms concerning for ischemic stroke given presentation noting CBG and ammonia level normal and no other immediate explanation for symptoms -Patient without any apparent post ictal phase so doubt seizure activity -Check stat MRI/MRA brain since CT negative -Check carotid duplex and 2-D echocardiogram -Aspirin suppository but follow platelets closely since current platelets 99,000 -PT/OT/SLP evaluations -NPO until able to pass swallowing evaluation  Active Problems:   Seizure disorder -Had focal myoclonic jerking activity but otherwise symptoms not consistent with seizure activity -Continue Keppra noting there is somewhat of a dosage discrepancy so for now we'll give 500 mg IV every 12  as reported  on medication reconciliation -Myoclonic activity could've been related to electrolytes disturbance in patient with history of hypomagnesemia the past so we'll check a magnesium level    Alcohol abuse -Patient unable to quantify amount of alcohol he drinks daily but during previous admissions he is admitted to drinking the "one of a pint of brandy every 2 days; likely more -Family reported he had increased his alcohol intake -Begin med surg CIWA protocol but if symptoms not adequately controlled would adjust to stepdown protocol    COPD  -Compensated without current wheezing    HTN , malignant -Current blood pressure controlled -When necessary IV Lopressor with administration parameters until allowed to tolerate oral medications    Prolonged Q-T interval on ECG -Current QTC greater than 500 ms -Previous EKGs have averaged QTC readings between 475 ms and 507 ms -Avoid potentially offending medications such as fluoroquinolones, Zofran, and antipsychotics such as Haldol or Seroquel    PTSD     Chronic diastolic heart failure, NYHA class 1 -Appears compensated at the present time    Macrocytic anemia/thrombocytopenia -Check anemia panel and TSH -Hemoglobin stable at baseline of around 12 -Current platelets 99,000 and during last admission ranged between 102,000 and 137,000    DVT Prophylaxis: SCDs  Family Communication:   No family at bedside  Code Status:  Full code  Condition:  Stable  Time spent in minutes : 60   Dervin Vore L. ANP on 05/08/2014 at 12:58 PM  Between 7am to 7pm - Pager - [phone]  After 7pm go to [email or web address] - password [email or web address]  And look for the night coverage person covering me after hours  [facility]

## 2014-05-08 NOTE — Procedures (Addendum)
ELECTROENCEPHALOGRAM REPORT   Patient: Miguel Hanson       Room #: C27 EEG No. ID: [id number] Age: 69 y.o.        Sex: male Referring Physician: Ghimire Report Date:  05/08/2014        Interpreting Physician: Alexis Goodell  History: Miguel Hanson is an 69 y.o. male with a history of seizures presenting after family witnessed right sided tonic-clonic activity.    Medications:  Scheduled: .  stroke: mapping our early stages of recovery book   Does not apply Once  . aspirin  300 mg Rectal Daily  . folic acid  1 mg Intravenous Daily  . levETIRAcetam  500 mg Intravenous Q12H  . thiamine  100 mg Oral Daily   Or  . thiamine  100 mg Intravenous Daily    Conditions of Recording:  This is a 16 channel EEG carried out with the patient in the awake and drowsy states.  Description:  The patient appears to be drowsy throughout the majority of the recording.  The background activity is of very low voltage and poorly organized consisting of a mixture of frequencies with theta and alpha being most prominent.  The patient does alert for brief periods when a posterior background rhythm can be appreciated at 8 Hz alpha and is seen from the parieto-occipital and posterior temporal regions.  Underlying the background rhythm despite the state of the patient is a polymorphic delta rhythm that is seen over the left hemisphere slowing that activity further.  Also noted again over the left hemisphere are intermittent periodic sharp discharges with a triphasic morphology.  These are most prominent in the occipital region.   Stage II sleep is not obtained. Hyperventilation and intermittent photic stimulation were not performed.   IMPRESSION: This is an abnormal EEG secondary to focal slowing in the hemisphere region on the left.  This finding is suggestive of a focal disturbance that is etiologically nonspecific, but may include a mass lesion among other possibilities.  Also noted is intermittent periodic sharp  discharges of triphasic morphology, most notable in the left occipital region.  Although triphasic activity is most often noted with encephalopathies, in this patient with earlier right focal seizure activity can not rule out this being a post-ictal phenomena.       Alexis Goodell, MD [facility] [phone] 05/08/2014, 5:12 PM

## 2014-05-08 NOTE — ED Notes (Signed)
Family first called EMS because they thought having stroke, not responding appropriate.  FD arrived, patient was speaking but non-sensibly. Speech was garbled,  En route, patient had seizure like activity but only in the right side.  Hx of seizure, hx of stroke, generally grand mal stroke.  Drank last night.  Right side droop, leftward gaze, right eye and neck myoclonic activity.  Family denies seizure like activity, en route patient had right. 2.5 versed. 5 min activity, speech stayed the same during seizure activity.  Non-compliant with meds.

## 2014-05-08 NOTE — ED Provider Notes (Signed)
CSN: [id number]     Arrival date & time 05/08/14  9323 History   First MD Initiated Contact with Patient 05/08/14 0830     Chief Complaint  Patient presents with  . Seizures  . Altered Mental Status  . Aphasia     (Consider location/radiation/quality/duration/timing/severity/associated sxs/prior Treatment) HPI Comments: The patient is a 69 year old male with a history of heavy alcohol use, drug abuse, seizures and a recent diagnosis of hepatic encephalopathy for which she was admitted to the hospital in December 2015. At that time the patient had been taking Dilantin, he was switched to [place] according to the medical record. He was discharged on lactulose and Keppra. Paramedics report that the patient was found this morning having difficulty speaking, expressive aphasia was present, there was apparently some right-sided arm and leg weakness, he did have witnessed myoclonic jerking of his facial muscles on the right, right arm and right leg in the ambulance that lasted over 5 minutes, he was given Versed and this resolved, he was able to follow commands with the left hand during this time. The patient is unable to give me any history as he does have an expressive aphasia at this time. Family members found the patient this morning and state that he has had increased alcohol use recently.  Patient is a 69 y.o. male presenting with seizures and altered mental status. The history is provided by the EMS personnel and medical records.  Seizures Altered Mental Status Associated symptoms: seizures     Past Medical History  Diagnosis Date  . Hypertension   . Seizures   . ETOH abuse   . Drug abuse   . PTSD (post-traumatic stress disorder)   . H/O prolonged Q-T interval on ECG   . Polysubstance abuse   . Alcohol abuse   . Depression    Past Surgical History  Procedure Laterality Date  . No past surgeries     No family history on file. History  Substance Use Topics  . Smoking status:  Current Some Day Smoker -- 0.50 packs/day for 49 years    Types: Cigarettes  . Smokeless tobacco: Current User  . Alcohol Use: 21.6 oz/week    36 Shots of liquor per week     Comment: 03/01/2013 "drink a pint of brandy q 2 days"    Review of Systems  Unable to perform ROS: Mental status change  Neurological: Positive for seizures.      Allergies  Review of patient's allergies indicates no known allergies.  Home Medications   Prior to Admission medications   Medication Sig Start Date End Date Taking? Authorizing Provider  levETIRAcetam (KEPPRA) 1000 MG tablet Take 1 tablet (1,000 mg total) by mouth 2 (two) times daily. Patient taking differently: Take 500 mg by mouth 2 (two) times daily.  02/27/14  Yes Samella Parr, NP  lisinopril (PRINIVIL,ZESTRIL) 10 MG tablet Take 1 tablet (10 mg total) by mouth daily. 03/04/13  Yes Wilber Oliphant, MD  sertraline (ZOLOFT) 100 MG tablet Take 200 mg by mouth daily. 07/07/11  Yes Robbie Lis, MD  feeding supplement, ENSURE COMPLETE, (ENSURE COMPLETE) LIQD Take 237 mLs by mouth 2 (two) times daily between meals. Patient not taking: Reported on 05/08/2014 03/04/13   Wilber Oliphant, MD  lactulose (CHRONULAC) 10 GM/15ML solution Take 45 mLs (30 g total) by mouth daily. Patient not taking: Reported on 05/08/2014 02/27/14   Samella Parr, NP  Magnesium 400 MG CAPS Take 400 mg by mouth 2 (  two) times daily. Patient not taking: Reported on 05/08/2014 02/27/14   Samella Parr, NP   BP 128/87 mmHg  Pulse 88  Temp(Src) 99.2 F (37.3 C) (Oral)  Resp 18  Ht 5\' 6"  (1.676 m)  Wt 120 lb (54.432 kg)  BMI 19.38 kg/m2  SpO2 98% Physical Exam  Constitutional: He appears well-developed and well-nourished. No distress.  HENT:  Head: Normocephalic and atraumatic.  Mouth/Throat: Oropharynx is clear and moist. No oropharyngeal exudate.  Few teeth remaining, moist mucous membranes, no tongue biting or tongue injury  Eyes: Conjunctivae and EOM are normal. Pupils  are equal, round, and reactive to light. Right eye exhibits no discharge. Left eye exhibits no discharge. No scleral icterus.  Neck: Normal range of motion. Neck supple. No JVD present. No thyromegaly present.  Cardiovascular: Normal rate, regular rhythm, normal heart sounds and intact distal pulses.  Exam reveals no gallop and no friction rub.   No murmur heard. Pulmonary/Chest: Effort normal and breath sounds normal. No respiratory distress. He has no wheezes. He has no rales.  Abdominal: Soft. Bowel sounds are normal. He exhibits no distension and no mass. There is no tenderness.  Musculoskeletal: Normal range of motion. He exhibits no edema or tenderness.  Lymphadenopathy:    He has no cervical adenopathy.  Neurological: He is alert. Coordination normal.  The patient is alert, he follows occasional commands, he gets confused very easily with these commands, he is able to hold his hands up but not able to show me 2 fingers. He is able to lift both legs up, the right side does seem to have some weakness and there is some right sided pronator drift. He will not track eyes to the right  Skin: Skin is warm and dry. No rash noted. No erythema.  Psychiatric: He has a normal mood and affect. His behavior is normal.  Nursing note and vitals reviewed.   ED Course  Procedures (including critical care time) Labs Review Labs Reviewed  CBC - Abnormal; Notable for the following:    RBC 2.89 (*)    Hemoglobin 11.2 (*)    HCT 31.0 (*)    MCV 107.3 (*)    MCH 38.8 (*)    MCHC 36.1 (*)    RDW 19.2 (*)    Platelets 99 (*)    All other components within normal limits  DIFFERENTIAL - Abnormal; Notable for the following:    Eosinophils Relative 8 (*)    All other components within normal limits  COMPREHENSIVE METABOLIC PANEL - Abnormal; Notable for the following:    AST 105 (*)    All other components within normal limits  PHENYTOIN LEVEL, TOTAL - Abnormal; Notable for the following:    Phenytoin  Lvl <2.5 (*)    All other components within normal limits  URINALYSIS, ROUTINE W REFLEX MICROSCOPIC - Abnormal; Notable for the following:    Color, Urine AMBER (*)    APPearance CLOUDY (*)    Ketones, ur 15 (*)    Leukocytes, UA SMALL (*)    All other components within normal limits  URINE RAPID DRUG SCREEN (HOSP PERFORMED) - Abnormal; Notable for the following:    Benzodiazepines POSITIVE (*)    Tetrahydrocannabinol POSITIVE (*)    All other components within normal limits  URINE MICROSCOPIC-ADD ON - Abnormal; Notable for the following:    Bacteria, UA MANY (*)    Casts HYALINE CASTS (*)    All other components within normal limits  I-STAT CHEM 8,  ED - Abnormal; Notable for the following:    Calcium, Ion 1.11 (*)    Hemoglobin 12.2 (*)    HCT 36.0 (*)    All other components within normal limits  CBG MONITORING, ED - Abnormal; Notable for the following:    Glucose-Capillary 118 (*)    All other components within normal limits  ETHANOL  PROTIME-INR  APTT  AMMONIA  URINE RAPID DRUG SCREEN (HOSP PERFORMED)  URINALYSIS, ROUTINE W REFLEX MICROSCOPIC  TSH  MAGNESIUM  I-STAT TROPOININ, ED  I-STAT TROPOININ, ED    Imaging Review Ct Head Wo Contrast  05/08/2014   CLINICAL DATA:  69 year old male with seizure-like activity on right side. History seizures. Slurred speech. Alcohol consumption last evening. History of drug use and alcohol abuse. Initial encounter.  EXAM: CT HEAD WITHOUT CONTRAST  TECHNIQUE: Contiguous axial images were obtained from the base of the skull through the vertex without intravenous contrast.  COMPARISON:  02/25/2014 brain MR and head CT.  FINDINGS: No intracranial hemorrhage.  Small vessel disease type changes without CT evidence of large acute infarct.  Global atrophy without hydrocephalus.  No intracranial mass lesion noted on this unenhanced exam.  Vascular calcifications.  Left sphenoid sinus chronic sinusitis type changes. Additionally air-fluid level raise  possibility of acute sinusitis.  Complete opacification left frontal sinus without bony expansion. Appearance unchanged.  IMPRESSION: No intracranial hemorrhage.  Small vessel disease type changes without CT evidence of large acute infarct.  Global atrophy without hydrocephalus.  Left sphenoid sinus chronic sinusitis type changes. Additionally air-fluid level raise possibility of acute sinusitis.  Complete opacification left frontal sinus without bony expansion. Appearance unchanged.   Electronically Signed   By: Genia Del [name]   On: 05/08/2014 10:00     EKG Interpretation   Date/Time:  Tuesday May 08 2014 08:35:35 EST Ventricular Rate:  102 PR Interval:  176 QRS Duration: 84 QT Interval:  397 QTC Calculation: 517 R Axis:   59 Text Interpretation:  Sinus tachycardia Atrial premature complexes  Prolonged QT interval since last tracing no significant change QT has  lengthened Confirmed by Adonna Horsley  MD, Thornton ([id number]) on 05/08/2014 8:53:04 AM      MDM   Final diagnoses:  Confusion  Expressive aphasia    The patient has a repetitive garbled speech, his neurologic exam is grossly abnormal, there is no seizure activity at this time, CT scan of the head, neurologic consultation, check labs including Dilantin in case he is still taking that medication, check ammonia for possible hepatic encephalopathy, Versed given prior to arrival, no seizure activity at this time, we'll maintain cardiac monitoring and order frequent neurologic checks.  Labs have resulted showing no specific etiology to the patient's symptoms, I have discussed his care with neurology who agrees with admission to the hospital for possible stroke, discussed with admitting service who is in agreement.    Noemi Chapel, MD 05/08/14 [phone]

## 2014-05-08 NOTE — ED Notes (Signed)
Patient transported to CT 

## 2014-05-08 NOTE — Progress Notes (Signed)
VASCULAR LAB PRELIMINARY  PRELIMINARY  PRELIMINARY  PRELIMINARY  Carotid Duplex completed.    Preliminary report:  RT ICA/CCA ratio:  1.55                                 LT ICA/CCA ratio:  0.84 Antegrade vertebral flow bilaterally.  Note loss of normal systolic peak in waveforms of the ICA and CCA bilaterally.  Carotid stenosis within the 1-39% range.  August Albino, RVT 05/08/2014, 2:03 PM

## 2014-05-08 NOTE — ED Notes (Signed)
Dr.Miller given results of istat trop. Ed-Lab.

## 2014-05-09 DIAGNOSIS — G40909 Epilepsy, unspecified, not intractable, without status epilepticus: Secondary | ICD-10-CM

## 2014-05-09 DIAGNOSIS — F1010 Alcohol abuse, uncomplicated: Secondary | ICD-10-CM

## 2014-05-09 DIAGNOSIS — F4310 Post-traumatic stress disorder, unspecified: Secondary | ICD-10-CM

## 2014-05-09 DIAGNOSIS — R401 Stupor: Secondary | ICD-10-CM

## 2014-05-09 DIAGNOSIS — R4701 Aphasia: Secondary | ICD-10-CM

## 2014-05-09 DIAGNOSIS — R410 Disorientation, unspecified: Secondary | ICD-10-CM

## 2014-05-09 DIAGNOSIS — J431 Panlobular emphysema: Secondary | ICD-10-CM

## 2014-05-09 LAB — LIPID PANEL
CHOLESTEROL: 150 mg/dL (ref 0–200)
HDL: 68 mg/dL (ref >39)
LDL Cholesterol: 63 mg/dL (ref 0–99)
Total CHOL/HDL Ratio: 2.2 RATIO
Triglycerides: 94 mg/dL (ref <150)
VLDL: 19 mg/dL (ref 0–40)

## 2014-05-09 LAB — IRON AND TIBC
Iron: 64 ug/dL (ref 42–165)
SATURATION RATIOS: 28 % (ref 20–55)
TIBC: 227 ug/dL (ref 215–435)
UIBC: 163 ug/dL (ref 125–400)

## 2014-05-09 LAB — RETICULOCYTES
RBC.: 2.64 MIL/uL — AB (ref 4.22–5.81)
RETIC CT PCT: 1.5 % (ref 0.4–3.1)
Retic Count, Absolute: 39.6 10*3/uL (ref 19.0–186.0)

## 2014-05-09 LAB — FOLATE

## 2014-05-09 LAB — VITAMIN B12: Vitamin B-12: 598 pg/mL (ref 211–911)

## 2014-05-09 LAB — FERRITIN: Ferritin: 723 ng/mL — ABNORMAL HIGH (ref 22–322)

## 2014-05-09 MED ORDER — ENSURE COMPLETE PO LIQD: 237.0000 mL | Freq: Two times a day (BID) | ORAL | Status: DC

## 2014-05-09 MED ORDER — LORAZEPAM 2 MG/ML IJ SOLN
2.0000 mg | INTRAMUSCULAR | Status: DC | PRN
  Administered 2014-05-09: 2 mg via INTRAVENOUS
  Filled 2014-05-09: qty 1

## 2014-05-09 MED ORDER — DEXMEDETOMIDINE HCL IN NACL 200 MCG/50ML IV SOLN: 0.2000 ug/kg/h | INTRAVENOUS | Status: DC

## 2014-05-09 MED ORDER — METOPROLOL TARTRATE 1 MG/ML IV SOLN
5.0000 mg | Freq: Four times a day (QID) | INTRAVENOUS | Status: DC
  Administered 2014-05-09 – 2014-05-12 (×7): 5 mg via INTRAVENOUS
  Filled 2014-05-09 (×15): qty 5

## 2014-05-09 MED ORDER — ASPIRIN EC 325 MG PO TBEC
325.0000 mg | DELAYED_RELEASE_TABLET | Freq: Every day | ORAL | Status: DC
  Administered 2014-05-09: 325 mg via ORAL
  Filled 2014-05-09: qty 1

## 2014-05-09 MED ORDER — LEVETIRACETAM IN NACL 1000 MG/100ML IV SOLN
1000.0000 mg | INTRAVENOUS | Status: AC
  Administered 2014-05-09: 1000 mg via INTRAVENOUS
  Filled 2014-05-09: qty 100

## 2014-05-09 MED ORDER — LORAZEPAM 2 MG/ML IJ SOLN
INTRAMUSCULAR | Status: AC
  Filled 2014-05-09: qty 1

## 2014-05-09 MED ORDER — LORAZEPAM 2 MG/ML IJ SOLN: 1.0000 mg | INTRAMUSCULAR | Status: DC | PRN

## 2014-05-09 MED ORDER — LORAZEPAM 2 MG/ML IJ SOLN
2.0000 mg | Freq: Once | INTRAMUSCULAR | Status: AC
  Administered 2014-05-09: 2 mg via INTRAVENOUS

## 2014-05-09 MED ORDER — DEXMEDETOMIDINE HCL IN NACL 200 MCG/50ML IV SOLN
0.2000 ug/kg/h | INTRAVENOUS | Status: AC
  Administered 2014-05-09: 0.2 ug/kg/h via INTRAVENOUS
  Administered 2014-05-10 (×2): 0.7 ug/kg/h via INTRAVENOUS
  Administered 2014-05-10: 0.4 ug/kg/h via INTRAVENOUS
  Filled 2014-05-09 (×5): qty 50

## 2014-05-09 MED ORDER — MAGNESIUM SULFATE 2 GM/50ML IV SOLN
2.0000 g | Freq: Once | INTRAVENOUS | Status: AC
  Administered 2014-05-09: 2 g via INTRAVENOUS
  Filled 2014-05-09: qty 50

## 2014-05-09 MED ORDER — HYDRALAZINE HCL 20 MG/ML IJ SOLN
10.0000 mg | INTRAMUSCULAR | Status: DC | PRN
  Administered 2014-05-10 – 2014-05-15 (×6): 10 mg via INTRAVENOUS
  Filled 2014-05-09 (×6): qty 1

## 2014-05-09 NOTE — Evaluation (Signed)
Speech Language Pathology Evaluation Patient Details Name: Miguel Hanson MRN: [id number] DOB: Nov 12, 1945 Today's Date: 05/09/2014 Time: 1125-1150 SLP Time Calculation (min) (ACUTE ONLY): 25 min  Problem List:  Patient Active Problem List   Diagnosis Date Noted  . Chronic diastolic heart failure, NYHA class 1 05/08/2014  . Expressive aphasia 05/08/2014  . Macrocytic anemia 05/08/2014  . Prolonged Q-T interval on ECG   . Hepatic encephalopathy 02/25/2014  . Protein-calorie malnutrition, severe 03/03/2013  . Dilantin toxicity 03/01/2013  . HTN (hypertension), malignant 12/16/2011  . Seizure, convulsive 08/11/2011  . Altered mental status 08/11/2011  . COPD (chronic obstructive pulmonary disease) 03/10/2011  . Seizure disorder 03/08/2011  . Alcohol abuse 03/08/2011  . PTSD (post-traumatic stress disorder) 03/08/2011   Past Medical History:  Past Medical History  Diagnosis Date  . Hypertension   . Seizures   . ETOH abuse   . Drug abuse   . PTSD (post-traumatic stress disorder)   . H/O prolonged Q-T interval on ECG   . Polysubstance abuse   . Alcohol abuse   . Depression    Past Surgical History:  Past Surgical History  Procedure Laterality Date  . No past surgeries     HPI:  69 y.o. male brought in with confusion, right sided weakness (resolved), witnessed myoclonic jerking of his facial muscles on the right, right arm and right leg in the ambulance that lasted over 5 minutes, trouble speaking. CT brain unremarkable for acute abnormality. MRI brain without acute abnormality. EEG with focal slowing in the hemisphere region on the left and intermittent sharp activity of triphasic morphology, most notable in the left occipital region. Per neuro, his mental status is not really improved and thus concerned now that EEG findings in this particular context could be indicative of subclinical seizures.  Assessment / Plan / Recommendation Clinical Impression  Pt presents with symptoms  consistent with a posterior, fluent, Wernicke-like aphasia, despite absence of findings on MRI.  Comprehension is significantly impaired. Output is irrelevant, fluent, with appropriate grammatical form but empty in meaning.  Verbalizations are littered with perseverations ("I got the seven on the seven....its on a seven seven.") Verbal repetition is difficult. Pt has isolated ability to follow simple, high frequency commands when given context/predictability.  Written output consisted of perseveratory numbers.  Requires simple instruction that is accompanied by gestures, repetition, and demonstration.  Recommend SLP f/u to address functional comprehension, safety, family education.    SLP Assessment  Patient needs continued Speech Lanaguage Pathology Services    Follow Up Recommendations   (tba)    Frequency and Duration min 3x week  2 weeks   Pertinent Vitals/Pain Pain Assessment: Faces Faces Pain Scale: No hurt   SLP Goals  Potential to Achieve Goals (ACUTE ONLY): Fair  SLP Evaluation Prior Functioning  Cognitive/Linguistic Baseline: Information not available Type of Home: House  Lives With: Spouse Available Help at Discharge: Family   Cognition  Overall Cognitive Status: No family/caregiver present to determine baseline cognitive functioning Arousal/Alertness: Awake/alert Attention: Focused Focused Attention: Impaired Awareness: Impaired Awareness Impairment: Intellectual impairment Problem Solving: Impaired Safety/Judgment: Impaired    Comprehension  Auditory Comprehension Overall Auditory Comprehension: Impaired Yes/No Questions: Impaired Basic Biographical Questions: 0-25% accurate Commands: Impaired One Step Basic Commands: 0-24% accurate Conversation: Simple (impaired) Interfering Components: Attention Visual Recognition/Discrimination Discrimination: Exceptions to [facility] Common Objects: Unable to indentify Reading Comprehension Reading Status:  (reads occasional  words aloud)    Expression Expression Primary Mode of Expression: Verbal Verbal Expression Overall Verbal Expression: Impaired  Initiation: No impairment Level of Generative/Spontaneous Verbalization: Conversation Repetition: Impaired Level of Impairment: Word level Naming: Impairment Responsive: 0-25% accurate Confrontation: Impaired Common Objects: Unable to indentify Convergent: 0-24% accurate Verbal Errors: Semantic paraphasias;Phonemic paraphasias;Perseveration Pragmatics: Impairment Written Expression Dominant Hand: Right Written Expression:  (perseverative, micrographic output)   Oral / Motor Oral Motor/Sensory Function Overall Oral Motor/Sensory Function: Appears within functional limits for tasks assessed Motor Speech Overall Motor Speech: Appears within functional limits for tasks assessed   Miguel Hanson L. Tivis Ringer, [place] CCC/SLP Pager [phone]      Miguel Hanson 05/09/2014, 12:24 PM

## 2014-05-09 NOTE — Progress Notes (Signed)
[place] TEAM 1 - Stepdown/ICU TEAM Progress Note  Miguel Hanson HUT:[id number] DOB: March 06, 1945 DOA: 05/08/2014 PCP: [place] HPI / Brief Narrative: 69 y.o. Male with history of alcohol abuse as well as occasional use of marijuana, hypertension, seizure disorder (most likely related to alcohol withdrawal), recent admission for hepatic encephalopathy, and ongoing issues related to depression and PTSD who was sent to the ER by his family via EMS after they noticed he was confused with garbled speech and focal seizure-like activity on the right. EMS noticed he was having difficulty speaking with expressive aphasia and right arm/leg weakness as well as myoclonic jerking of his facial muscles, right arm, and right leg. He was given Versed and this resolved.   In the ER CT of the head was without acute abnormality. Alcohol level was less than 5, platelets were 99,000, ammonia level was normal. Urinalysis was pending.  HPI/Subjective: Pt is alert, but uanble to communicate effectively.  He repeats reflex type phrases regardless of the question he is asked - "you got that right" - "you better believe it."  He is able to move all 4 extrem against gravity and challenge.  He does not have obvious cranial nerve deficits.  He appears comfortable.    Assessment/Plan:  Expressive aphasia / altered mental status MRI/A brain notes mod atrophy but no mass or acute infarct - Neuro following - ?subclinical ongoing seizures - being loaded w/ keppra IV  EtOH abuse  Ammonia level normal - LFTs normal - coags normal - follow for withdrawal sx   UTI  UA concerning for UTI - avoid empiric abx at this time as many abx can lower sz threhsold - if develops fevers will begin - o/w follow urine culture   COPD Well compensated at present   Chronic Prolonged QT  Hypomagnesemia Replace and follow - goal is Mg of 2.0 or >  HTN BP poorly controlled - adjust tx and follow trend    PTSD  Chronic diastolic CHF  No evidence of acute decompensation at this time   Macrocytic anemia - thrombocytopenia B12 pending - likely due to ongoing EtOH abuse   Code Status: FULL Family Communication:  No family present at time of exam  Disposition Plan: SDU  Consultants: Neurology   Procedures: 3/8 TTE - pending  3/8 Carotid dopplers - no signif stenosis B - vertebral flow antegrade 3/8 EEG -  focal slowing in the hemisphere region on the left  Antibiotics: none  DVT prophylaxis: SCDs  Objective: Blood pressure 123/89, pulse 88, temperature 97.8 F (36.6 C), temperature source Axillary, resp. rate 18, height 5\' 6"  (1.676 m), weight 50 kg (110 lb 3.7 oz), SpO2 96 %.  Intake/Output Summary (Last 24 hours) at 05/09/14 0946 Last data filed at 05/09/14 6568  Gross per 24 hour  Intake 1871.25 ml  Output    200 ml  Net 1671.25 ml   Exam: General: No acute respiratory distress - alert  Lungs: Clear to auscultation bilaterally without wheezes or crackles Cardiovascular: Regular rate and rhythm without murmur gallop or rub normal S1 and S2 Abdomen: Nontender, nondistended, soft, bowel sounds positive, no rebound, no ascites, no appreciable mass Extremities: No significant cyanosis, clubbing, or edema bilateral lower extremities Neuro:  As discussed in HPI  Data Reviewed: Basic Metabolic Panel:  Recent Labs Lab 05/08/14 0845 05/08/14 0855 05/08/14 1709  NA 136 136  --   K 4.0 4.0  --   CL 100 99  --  CO2 26  --   --   GLUCOSE 95 96  --   BUN 10 9  --   CREATININE 0.64 0.60  --   CALCIUM 9.0  --   --   MG  --   --  1.3*    Liver Function Tests:  Recent Labs Lab 05/08/14 0845  AST 105*  ALT 34  ALKPHOS 89  BILITOT 0.7  PROT 6.8  ALBUMIN 3.6    Recent Labs Lab 05/08/14 0845  AMMONIA 24    Coags:  Recent Labs Lab 05/08/14 0845  INR 1.10    Recent Labs Lab 05/08/14 0845  APTT 34    CBC:  Recent Labs Lab 05/08/14 0845  05/08/14 0855  WBC 7.4  --   NEUTROABS 4.8  --   HGB 11.2* 12.2*  HCT 31.0* 36.0*  MCV 107.3*  --   PLT 99*  --     CBG:  Recent Labs Lab 05/08/14 0922  GLUCAP 118*    Recent Results (from the past 240 hour(s))  MRSA PCR Screening     Status: None   Collection Time: 05/08/14  7:56 PM  Result Value Ref Range Status   MRSA by PCR NEGATIVE NEGATIVE Final    Comment:        The GeneXpert MRSA Assay (FDA approved for NASAL specimens only), is one component of a comprehensive MRSA colonization surveillance program. It is not intended to diagnose MRSA infection nor to guide or monitor treatment for MRSA infections.      Studies:  Recent x-ray studies have been reviewed in detail by the Attending Physician  Scheduled Meds:  Scheduled Meds: .  stroke: mapping our early stages of recovery book   Does not apply Once  . aspirin EC  325 mg Oral Daily  . folic acid  1 mg Intravenous Daily  . levETIRAcetam  500 mg Intravenous Q12H  . thiamine  100 mg Oral Daily   Or  . thiamine  100 mg Intravenous Daily    Time spent on care of this patient: 35 mins   MCCLUNG,JEFFREY T , MD   [facility] Office  [phone] Pager - Text Page per Shea Evans as per below:  On-Call/Text Page:      [email or web address]      password [email or web address]  If 7PM-7AM, please contact night-coverage [email or web address] Password [email or web address] 05/09/2014, 9:46 AM   LOS: 1 day

## 2014-05-09 NOTE — Progress Notes (Signed)
Attempted to put the telemetry  back but refused. Continue to monitor, sitter at bedside.

## 2014-05-09 NOTE — Progress Notes (Signed)
Started to get agitated, pulled out telemetry and wants to get out of the room. Md made aware with order ativan 2 mg iv given, sitter at bedside. Continue to monitor.

## 2014-05-09 NOTE — Progress Notes (Signed)
NEURO HOSPITALIST PROGRESS NOTE   SUBJECTIVE:                                                                                                                        Out of bed. He is alert and awake but consistently replies to all questions with " yes" or " number one" " just right there". MRI brain was personally reviewed and showed no acute abnormality. EEG " abnormal EEG secondary to focal slowing in the hemisphere region on the left. This finding is suggestive of a focal disturbance that is etiologically nonspecific, but may include a mass lesion among other possibilities. Also noted is intermittent sharp activity of triphasic morphology, most notable in the left occipital region. Although triphasic activity is most often noted with encephalopathies, in this patient with earlier right focal seizure activity can not rule out this being a post-ictal phenomena". On IV keppra 500 mg BID. Thiamine. ETOH level <5.    OBJECTIVE:                                                                                                                           Vital signs in last 24 hours: Temp:  [97.8 F (36.6 C)-99.2 F (37.3 C)] 97.8 F (36.6 C) (03/09 0314) Pulse Rate:  [71-96] 88 (03/09 0730) Resp:  [10-22] 18 (03/09 0730) BP: (114-155)/(77-101) 123/89 mmHg (03/09 0730) SpO2:  [96 %-100 %] 96 % (03/09 0730) Weight:  [50 kg (110 lb 3.7 oz)-54.432 kg (120 lb)] 50 kg (110 lb 3.7 oz) (03/08 1956)  Intake/Output from previous day: 03/08 0701 - 03/09 0700 In: 1481.3 [I.V.:1281.3; IV Piggyback:200] Out: 200 [Urine:200] Intake/Output this shift: Total I/O In: 465 [P.O.:240; I.V.:225] Out: -  Nutritional status: Diet Heart  Past Medical History  Diagnosis Date  . Hypertension   . Seizures   . ETOH abuse   . Drug abuse   . PTSD (post-traumatic stress disorder)   . H/O prolonged Q-T interval on ECG   . Polysubstance abuse   . Alcohol abuse   .  Depression    Physical exam: pleasant male in no apparent distress. Blood pressure 148/94, pulse 74, temperature 98.6 F (37 C), temperature source Oral, resp. rate 19, height 5\' 6"  (  1.676 m), weight 54.432 kg (120 lb), SpO2 98 % Head: normocephalic. Neck: supple, no bruits, no JVD. Cardiac: no murmurs. Lungs: clear. Abdomen: soft, no tender, no mass. Extremities: no edema. Skin: no rash  Neurologic Exam:  General: Mental Status: Alert and awake, perseverates, follows simple commands but inconsistently.  Cranial Nerves: II: Discs were not assesed; Visual fields grossly normal, pupils equal, round, reactive to light and accommodation III,IV, VI: ptosis not present, extra-ocular motions intact bilaterally V,VII: smile symmetric, facial light touch sensation normal bilaterally VIII: hearing normal bilaterally IX,X: uvula rises symmetrically XI: bilateral shoulder shrug XII: midline tongue extension without atrophy or fasciculations Motor: Moves all limbs symmetrically Sensory: Pinprick and light touch intact unreliable due to confusion Deep Tendon Reflexes:  2 in the upper extremities, 1+ patella bilaterally.  Plantars: Right: downgoingLeft: downgoing Cerebellar: Unable to test as patient is not following commands consistently. Gait:  No tested due to safety reasons.  Lab Results: Lab Results  Component Value Date/Time   CHOL 150 05/09/2014 03:38 AM   Lipid Panel  Recent Labs  05/09/14 0338  CHOL 150  TRIG 94  HDL 68  CHOLHDL 2.2  VLDL 19  LDLCALC 63    Studies/Results: Ct Head Wo Contrast  05/08/2014   CLINICAL DATA:  69 year old male with seizure-like activity on right side. History seizures. Slurred speech. Alcohol consumption last evening. History of drug use and alcohol abuse. Initial encounter.  EXAM: CT HEAD WITHOUT CONTRAST  TECHNIQUE: Contiguous axial images were obtained from the base of the skull through the vertex  without intravenous contrast.  COMPARISON:  02/25/2014 brain MR and head CT.  FINDINGS: No intracranial hemorrhage.  Small vessel disease type changes without CT evidence of large acute infarct.  Global atrophy without hydrocephalus.  No intracranial mass lesion noted on this unenhanced exam.  Vascular calcifications.  Left sphenoid sinus chronic sinusitis type changes. Additionally air-fluid level raise possibility of acute sinusitis.  Complete opacification left frontal sinus without bony expansion. Appearance unchanged.  IMPRESSION: No intracranial hemorrhage.  Small vessel disease type changes without CT evidence of large acute infarct.  Global atrophy without hydrocephalus.  Left sphenoid sinus chronic sinusitis type changes. Additionally air-fluid level raise possibility of acute sinusitis.  Complete opacification left frontal sinus without bony expansion. Appearance unchanged.   Electronically Signed   By: Genia Del [name]   On: 05/08/2014 10:00   Mr Jodene Nam Head Wo Contrast  05/08/2014   CLINICAL DATA:  Confusion. Alcohol abuse. Seizure disorder. Hypertension.  EXAM: MRI HEAD WITHOUT CONTRAST  MRA HEAD WITHOUT CONTRAST  TECHNIQUE: Multiplanar, multiecho pulse sequences of the brain and surrounding structures were obtained without intravenous contrast. Angiographic images of the head were obtained using MRA technique without contrast.  COMPARISON:  CT head 05/08/2014  FINDINGS: MRI HEAD FINDINGS  Moderate atrophy. Negative for hydrocephalus. Craniocervical junction normal. Pituitary normal in size.  Negative for acute infarct. Mild to moderate chronic microvascular ischemic change in the white matter. Mild chronic ischemia in the pons. No cortical infarct  Negative for intracranial hemorrhage.  Negative for mass or edema.  Temporal lobe atrophy. Medial temporal lobe is symmetric in volume with symmetric normal signal.  MRA HEAD FINDINGS  Left vertebral artery widely patent to the basilar. Right vertebral  artery ends in PICA. Left PICA not visualized. Basilar is widely patent. Superior cerebellar and posterior cerebral arteries are patent bilaterally without stenosis  Cavernous carotid widely patent bilaterally. Anterior and middle cerebral arteries patent. Mild stenosis of the parietal branch  of the right middle cerebral artery.  Negative for cerebral aneurysm.  IMPRESSION: Moderate atrophy.  Mild to moderate chronic microvascular ischemia.  Negative for acute infarct or mass  Mild stenosis right middle cerebral artery branch. No flow limiting intracranial stenosis.   Electronically Signed   By: Franchot Gallo [name]   On: 05/08/2014 16:06   [name] Contrast  05/08/2014   CLINICAL DATA:  Confusion. Alcohol abuse. Seizure disorder. Hypertension.  EXAM: MRI HEAD WITHOUT CONTRAST  MRA HEAD WITHOUT CONTRAST  TECHNIQUE: Multiplanar, multiecho pulse sequences of the brain and surrounding structures were obtained without intravenous contrast. Angiographic images of the head were obtained using MRA technique without contrast.  COMPARISON:  CT head 05/08/2014  FINDINGS: MRI HEAD FINDINGS  Moderate atrophy. Negative for hydrocephalus. Craniocervical junction normal. Pituitary normal in size.  Negative for acute infarct. Mild to moderate chronic microvascular ischemic change in the white matter. Mild chronic ischemia in the pons. No cortical infarct  Negative for intracranial hemorrhage.  Negative for mass or edema.  Temporal lobe atrophy. Medial temporal lobe is symmetric in volume with symmetric normal signal.  MRA HEAD FINDINGS  Left vertebral artery widely patent to the basilar. Right vertebral artery ends in PICA. Left PICA not visualized. Basilar is widely patent. Superior cerebellar and posterior cerebral arteries are patent bilaterally without stenosis  Cavernous carotid widely patent bilaterally. Anterior and middle cerebral arteries patent. Mild stenosis of the parietal branch of the right middle cerebral artery.   Negative for cerebral aneurysm.  IMPRESSION: Moderate atrophy.  Mild to moderate chronic microvascular ischemia.  Negative for acute infarct or mass  Mild stenosis right middle cerebral artery branch. No flow limiting intracranial stenosis.   Electronically Signed   By: Franchot Gallo [name]   On: 05/08/2014 16:06    MEDICATIONS                                                                                                                        Scheduled: .  stroke: mapping our early stages of recovery book   Does not apply Once  . aspirin EC  325 mg Oral Daily  . folic acid  1 mg Intravenous Daily  . levETIRAcetam  500 mg Intravenous Q12H  . magnesium sulfate 1 - 4 g bolus IVPB  2 g Intravenous Once  . thiamine  100 mg Oral Daily   Or  . thiamine  100 mg Intravenous Daily    ASSESSMENT/PLAN:  69 y.o. male brought in with confusion, right sided weakness (resolved), witnessed myoclonic jerking of his facial muscles on the right, right arm and right leg in the ambulance that lasted over 5 minutes, trouble speaking. CT brain unremarkable for acute abnormality. MRI brain without acute abnormality. EEG with focal slowing in the hemisphere region on the left and intermittent sharp activity of triphasic morphology, most notable in the left occipital region. His mental status is not really improved and thus concern now that EEG findings in this particular context could be indicative of subclinical seizures. Will give him 1 gram IV keppra now and increase his maintenance dose to 1 gram BID. Will continue to follow.  Dorian Pod, MD [facility] [phone]  05/09/2014, 10:31 AM

## 2014-05-09 NOTE — Evaluation (Signed)
Occupational Therapy Evaluation Patient Details Name: Miguel Hanson MRN: [id number] DOB: [birth year] Today's Date: 05/09/2014    History of Present Illness Pt adm aphasia and focal seizure-like activity on the right. CT and MRI negative.  PMH - polysubstance abuse, PTSD, COPD, HTN, seizure.   Clinical Impression   No family available for PLOF.  Pt presents with restlessness, impulsivity, aphasia, impaired cognition and decreased balance interfering with ability to perform self care and ADL transfers.  Will follow acutely.  Pt will need close supervision and post acute rehab.    Follow Up Recommendations  Supervision/Assistance - 24 hour;SNF (CIR has declined)    Equipment Recommendations       Recommendations for Other Services       Precautions / Restrictions Precautions Precautions: Fall       Mobility Bed Mobility Overal bed mobility: Needs Assistance Bed Mobility: Supine to Sit;Sit to Supine     Supine to sit: Supervision Sit to supine: Supervision   General bed mobility comments: verbal cues to initiate  Transfers Overall transfer level: Needs assistance Equipment used: None Transfers: Sit to/from Stand Sit to Stand: Min guard         General transfer comment: Assist for balance and cues for safety.    Balance Overall balance assessment: Needs assistance Sitting-balance support: No upper extremity supported Sitting balance-Leahy Scale: Good     Standing balance support: No upper extremity supported Standing balance-Leahy Scale: Fair                              ADL Overall ADL's : Needs assistance/impaired     Grooming: Wash/dry face;Supervision/safety;Sitting           Upper Body Dressing : Maximal assistance;Sitting   Lower Body Dressing: Minimal assistance;Sit to/from stand Lower Body Dressing Details (indicate cue type and reason): able to donn sock Toilet Transfer: Minimal assistance   Toileting- Clothing Manipulation  and Hygiene: Moderate assistance;Sit to/from stand Toileting - Clothing Manipulation Details (indicate cue type and reason): use of urinal standing at bedside       General ADL Comments: PT reports pt with R inattention vs field cut when ambulating, did not observe.     Vision     Perception     Praxis      Pertinent Vitals/Pain Pain Assessment: Faces Faces Pain Scale: No hurt     Hand Dominance Right   Extremity/Trunk Assessment Upper Extremity Assessment Upper Extremity Assessment: Difficult to assess due to impaired cognition (uses B UEs spontaneously and effectively for ADL)   Lower Extremity Assessment Lower Extremity Assessment: Defer to PT evaluation RLE Deficits / Details: grossly 4/5       Communication Communication Communication: Receptive difficulties;Expressive difficulties   Cognition Arousal/Alertness: Awake/alert Behavior During Therapy: Restless;Impulsive Overall Cognitive Status: No family/caregiver present to determine baseline cognitive functioning Area of Impairment: Safety/judgement;Attention;Following commands;Problem solving   Current Attention Level: Focused   Following Commands: Follows one step commands inconsistently (requires multimodal cues, in context) Safety/Judgement: Decreased awareness of safety;Decreased awareness of deficits   Problem Solving: Requires verbal cues;Requires tactile cues;Slow processing     General Comments       Exercises       Shoulder Instructions      Home Living Family/patient expects to be discharged to:: Private residence Living Arrangements: Spouse/significant other (wife is w/c bound due to MS) Available Help at Discharge: Family Type of Home: House Home Access: Ramped entrance  Home Layout: One level               Home Equipment: Walker - 2 wheels   Additional Comments: Information per old chart  Lives With: Spouse    Prior Functioning/Environment Level of Independence:  Independent        Comments: Information per old chart. In the past son and wife's sister have been able to assist at home if needed.    OT Diagnosis: Generalized weakness;Cognitive deficits   OT Problem List: Impaired balance (sitting and/or standing);Decreased coordination;Decreased cognition;Decreased safety awareness;Decreased knowledge of use of DME or AE   OT Treatment/Interventions: Self-care/ADL training;DME and/or AE instruction;Therapeutic activities;Cognitive remediation/compensation;Balance training;Patient/family education    OT Goals(Current goals can be found in the care plan section) Acute Rehab OT Goals Patient Stated Goal: pt unable to state due to aphasia OT Goal Formulation: Patient unable to participate in goal setting Time For Goal Achievement: 05/23/14 Potential to Achieve Goals: Fair ADL Goals Pt Will Perform Eating: with supervision;sitting Pt Will Perform Grooming: with supervision;standing Pt Will Perform Upper Body Bathing: with supervision;standing Pt Will Perform Lower Body Bathing: with supervision;sit to/from stand Pt Will Perform Upper Body Dressing: with supervision;standing Pt Will Perform Lower Body Dressing: with supervision;sit to/from stand Pt Will Transfer to Toilet: with supervision;ambulating;regular height toilet Pt Will Perform Toileting - Clothing Manipulation and hygiene: with supervision;sit to/from stand Additional ADL Goal #1: Pt will follow one step commands with contextual cues 75% of time.  OT Frequency: Min 2X/week   Barriers to D/C:            Co-evaluation              End of Session    Activity Tolerance: Patient tolerated treatment well Patient left: with nursing/sitter in room;in bed;with bed alarm set   Time: 9774-1423 OT Time Calculation (min): 16 min Charges:  OT General Charges $OT Visit: 1 Procedure OT Evaluation $Initial OT Evaluation Tier I: 1 Procedure G-Codes:    Malka So 05/09/2014,  1:25 PM [phone]

## 2014-05-09 NOTE — Evaluation (Signed)
Physical Therapy Evaluation Patient Details Name: Miguel Hanson MRN: [id number] DOB: 1946/02/24 Today's Date: 05/09/2014   History of Present Illness  Pt adm with expressive aphasia and focal seizure-like activity on the right. CT and MRI negative.  PMH - polysubstance abuse, PTSD, COPD, HTN, seizure.  Clinical Impression  Pt presents to PT with decr mobility due to decr strength, decr balance, and rt neglect/inattention vs field cut. At this time pt requires hands-on assist and verbal/tactile cues for all mobility. Recommend CIR.    Follow Up Recommendations CIR    Equipment Recommendations  None recommended by PT    Recommendations for Other Services Rehab consult     Precautions / Restrictions Precautions Precautions: Fall Precaution Comments: rt inattention vs field cut      Mobility  Bed Mobility Overal bed mobility: Needs Assistance Bed Mobility: Supine to Sit     Supine to sit: Supervision     General bed mobility comments: verbal cues to initiate  Transfers Overall transfer level: Needs assistance Equipment used: None;Rolling walker (2 wheeled) Transfers: Sit to/from Stand Sit to Stand: Min assist         General transfer comment: Assist for balance and cues for safety.  Ambulation/Gait Ambulation/Gait assistance: Min assist;Mod assist Ambulation Distance (Feet): 125 Feet Assistive device: Rolling walker (2 wheeled) Gait Pattern/deviations: Step-through pattern;Decreased step length - right;Decreased step length - left;Drifts right/left Gait velocity: decr Gait velocity interpretation: Below normal speed for age/gender General Gait Details: Min assist for balance and mod A to make turns to right. Pt required manual turning of walker to turn down the hallways to the rt. Mod A to find recliner chair to return to sitting once back in room even though pt standing with recliner less than 6" to his right.  Stairs            Wheelchair Mobility     Modified Rankin (Stroke Patients Only) Modified Rankin (Stroke Patients Only) Pre-Morbid Rankin Score: No symptoms Modified Rankin: Moderately severe disability     Balance Overall balance assessment: Needs assistance Sitting-balance support: No upper extremity supported Sitting balance-Leahy Scale: Good     Standing balance support: No upper extremity supported Standing balance-Leahy Scale: Fair                               Pertinent Vitals/Pain Pain Assessment: Faces Faces Pain Scale: No hurt    Home Living Family/patient expects to be discharged to:: Private residence Living Arrangements: Spouse/significant other (wife is w/c bound due to MS) Available Help at Discharge: Family Type of Home: House Home Access: Ramped entrance     Home Layout: One level Home Equipment: Environmental consultant - 2 wheels Additional Comments: Information per old chart    Prior Function Level of Independence: Independent         Comments: Information per old chart. In the past son and wife's sister have been able to assist at home if needed.     Hand Dominance   Dominant Hand: Right    Extremity/Trunk Assessment   Upper Extremity Assessment: Defer to OT evaluation           Lower Extremity Assessment: RLE deficits/detail RLE Deficits / Details: grossly 4/5       Communication   Communication: Expressive difficulties  Cognition     Overall Cognitive Status: Difficult to assess Area of Impairment: Safety/judgement;Problem solving         Safety/Judgement: Decreased awareness of  deficits   Problem Solving: Requires verbal cues;Requires tactile cues;Slow processing      General Comments      Exercises        Assessment/Plan    PT Assessment Patient needs continued PT services  PT Diagnosis Difficulty walking;Abnormality of gait;Altered mental status   PT Problem List Decreased strength;Decreased balance;Decreased mobility;Decreased cognition;Decreased  knowledge of use of DME;Decreased safety awareness;Decreased knowledge of precautions  PT Treatment Interventions DME instruction;Balance training;Gait training;Functional mobility training;Cognitive remediation;Patient/family education;Therapeutic activities;Therapeutic exercise;Neuromuscular re-education   PT Goals (Current goals can be found in the Care Plan section) Acute Rehab PT Goals Patient Stated Goal: pt unable to state due to aphasia PT Goal Formulation: Patient unable to participate in goal setting Time For Goal Achievement: 05/23/14 Potential to Achieve Goals: Good    Frequency Min 3X/week   Barriers to discharge        Co-evaluation               End of Session Equipment Utilized During Treatment: Gait belt Activity Tolerance: Patient tolerated treatment well Patient left: in chair;with call bell/phone within reach;with chair alarm set Nurse Communication: Mobility status         Time: 2707-8675 PT Time Calculation (min) (ACUTE ONLY): 20 min   Charges:   PT Evaluation $Initial PT Evaluation Tier I: 1 Procedure     PT G Codes:        Mical Kicklighter 2014/06/07, 10:49 AM  Suanne Marker PT [phone]

## 2014-05-09 NOTE — Progress Notes (Signed)
Transferred to [place] Room 5 by bed, report given to RN. Belongings with pt.

## 2014-05-09 NOTE — Progress Notes (Signed)
Received a total of 5 mg ativan iv, but still agitated, verbally abusive, trying to get out of the room. MD aware with order additional 2 mg ativan iv given, placed on bil wrist restraint tight enough, charge nurse, nurse adm. Aware, sitter at bedside, continue to monitor.

## 2014-05-09 NOTE — Consult Note (Addendum)
PULMONARY / CRITICAL CARE MEDICINE   Name: Miguel Hanson MRN: [id number] DOB: 04-23-1945    ADMISSION DATE:  05/08/2014 CONSULTATION DATE:  05/09/2014  REFERRING MD :  Thereasa Solo  CHIEF COMPLAINT:  ETOH withdrawal  INITIAL PRESENTATION:  69 y.o. M brought to [facility] ED 3/8 for confusion, garbled speech, and focal seizure like activity. He was admitted for AMS, seizure disorder, ETOH abuse.  On 3/9, he remained agitated despite high doses of ativan.  He was transferred to ICU and started on precedex.   STUDIES:  CT head 3/8 >>> Small vessel disease, global atrophy without hydrocephalus, air fluid level ? Sinusitis.  MRI / MRA brain 3/8 >>> no acute infarct or mass.  Moderate atrophy, mild to mod chronic microvascular ischemia.  Mild stenosis right MCA branch.  SIGNIFICANT EVENTS: 3/8 - admit 3/9 - transferred to ICU, started on precedex   HISTORY OF PRESENT ILLNESS:  Pt is encephalopathic; therefore, this HPI is obtained from chart review. Miguel Hanson is a 68 y.o. M with PMH as outlined below including alcohol and marijuana abuse.  He had recent admission for hepatic encephalopathy and issues related to PTSD and drpression.  On 3/8, he was sent to ED by family after they noticed him more confused with garbled speech and seizure like activity on the right.  On EMS arrival, pt was having expressive asphasia and had some right arm/leg weakness as well as myoclonic jerking of his facial muscles and R arm and leg.  Jerking resolved after receiving versed.  Family reported that pt had recently increased ETOH use. He was admitted by [facility] for AMS, seizure disorder, ETOH abuse. On 3/9, pt remained agitated despite high doses of ativan.  Antipsychotics were held due to prolonged QTc.  PCCM consulted and pt was transferred to the ICU and started on precedex.    PAST MEDICAL HISTORY :   has a past medical history of Hypertension; Seizures; ETOH abuse; Drug abuse; PTSD (post-traumatic stress disorder); H/O  prolonged Q-T interval on ECG; Polysubstance abuse; Alcohol abuse; and Depression.  has past surgical history that includes No past surgeries. Prior to Admission medications   Medication Sig Start Date End Date Taking? Authorizing Provider  levETIRAcetam (KEPPRA) 1000 MG tablet Take 1 tablet (1,000 mg total) by mouth 2 (two) times daily. Patient taking differently: Take 500 mg by mouth 2 (two) times daily.  02/27/14  Yes Samella Parr, NP  lisinopril (PRINIVIL,ZESTRIL) 10 MG tablet Take 1 tablet (10 mg total) by mouth daily. 03/04/13  Yes Wilber Oliphant, MD  sertraline (ZOLOFT) 100 MG tablet Take 200 mg by mouth daily. 07/07/11  Yes Robbie Lis, MD  feeding supplement, ENSURE COMPLETE, (ENSURE COMPLETE) LIQD Take 237 mLs by mouth 2 (two) times daily between meals. Patient not taking: Reported on 05/08/2014 03/04/13   Wilber Oliphant, MD  lactulose (CHRONULAC) 10 GM/15ML solution Take 45 mLs (30 g total) by mouth daily. Patient not taking: Reported on 05/08/2014 02/27/14   Samella Parr, NP  Magnesium 400 MG CAPS Take 400 mg by mouth 2 (two) times daily. Patient not taking: Reported on 05/08/2014 02/27/14   Samella Parr, NP   No Known Allergies  FAMILY HISTORY:  No family history on file.  SOCIAL HISTORY:  reports that he has been smoking Cigarettes.  He has a 24.5 pack-year smoking history. He uses smokeless tobacco. He reports that he drinks about 21.6 oz of alcohol per week. He reports that he uses illicit drugs (Marijuana).  REVIEW OF SYSTEMS:  Unable to obtain as pt is encephalopathic.  SUBJECTIVE:   VITAL SIGNS: Temp:  [97.8 F (36.6 C)-98.5 F (36.9 C)] 97.8 F (36.6 C) (03/09 1139) Pulse Rate:  [67-107] 71 (03/09 2045) Resp:  [13-21] 17 (03/09 2045) BP: (123-198)/(81-132) 130/81 mmHg (03/09 2045) SpO2:  [95 %-100 %] 98 % (03/09 2045) HEMODYNAMICS:   VENTILATOR SETTINGS:   INTAKE / OUTPUT: Intake/Output      03/09 0701 - 03/10 0700   P.O. 390   I.V. (mL/kg) 775.6  (15.5)   IV Piggyback 50   Total Intake(mL/kg) 1215.6 (24.3)   Urine (mL/kg/hr) 300 (0.4)   Stool 0 (0)   Total Output 300   Net +915.6       Urine Occurrence 2 x   Stool Occurrence 1 x     PHYSICAL EXAMINATION: General: Elderly appearing AA male, somewhat agitated. Neuro: Awake but answers questions inappropriately.  Attempting to get out of bed.  No focal deficits. HEENT: [place]/AT. PERRL, sclerae anicteric. Cardiovascular: RRR, no M/R/G.  Lungs: Respirations shallow and unlabored.  CTA bilaterally, No W/R/R. Abdomen: BS x 4, soft, NT/ND.  Musculoskeletal: No gross deformities, no edema.  Skin: Intact, warm, no rashes.  LABS:  CBC  Recent Labs Lab 05/08/14 0845 05/08/14 0855  WBC 7.4  --   HGB 11.2* 12.2*  HCT 31.0* 36.0*  PLT 99*  --    Coag's  Recent Labs Lab 05/08/14 0845  APTT 34  INR 1.10   BMET  Recent Labs Lab 05/08/14 0845 05/08/14 0855  NA 136 136  K 4.0 4.0  CL 100 99  CO2 26  --   BUN 10 9  CREATININE 0.64 0.60  GLUCOSE 95 96   Electrolytes  Recent Labs Lab 05/08/14 0845 05/08/14 1709  CALCIUM 9.0  --   MG  --  1.3*   Sepsis Markers No results for input(s): LATICACIDVEN, PROCALCITON, O2SATVEN in the last 168 hours. ABG No results for input(s): PHART, PCO2ART, PO2ART in the last 168 hours. Liver Enzymes  Recent Labs Lab 05/08/14 0845  AST 105*  ALT 34  ALKPHOS 89  BILITOT 0.7  ALBUMIN 3.6   Cardiac Enzymes No results for input(s): TROPONINI, PROBNP in the last 168 hours. Glucose  Recent Labs Lab 05/08/14 0922  GLUCAP 118*    Imaging Ct Head Wo Contrast  05/08/2014   CLINICAL DATA:  69 year old male with seizure-like activity on right side. History seizures. Slurred speech. Alcohol consumption last evening. History of drug use and alcohol abuse. Initial encounter.  EXAM: CT HEAD WITHOUT CONTRAST  TECHNIQUE: Contiguous axial images were obtained from the base of the skull through the vertex without intravenous contrast.   COMPARISON:  02/25/2014 brain MR and head CT.  FINDINGS: No intracranial hemorrhage.  Small vessel disease type changes without CT evidence of large acute infarct.  Global atrophy without hydrocephalus.  No intracranial mass lesion noted on this unenhanced exam.  Vascular calcifications.  Left sphenoid sinus chronic sinusitis type changes. Additionally air-fluid level raise possibility of acute sinusitis.  Complete opacification left frontal sinus without bony expansion. Appearance unchanged.  IMPRESSION: No intracranial hemorrhage.  Small vessel disease type changes without CT evidence of large acute infarct.  Global atrophy without hydrocephalus.  Left sphenoid sinus chronic sinusitis type changes. Additionally air-fluid level raise possibility of acute sinusitis.  Complete opacification left frontal sinus without bony expansion. Appearance unchanged.   Electronically Signed   By: Genia Del [name]   On: 05/08/2014 10:00  Mr Jodene Nam Head Wo Contrast  05/08/2014   CLINICAL DATA:  Confusion. Alcohol abuse. Seizure disorder. Hypertension.  EXAM: MRI HEAD WITHOUT CONTRAST  MRA HEAD WITHOUT CONTRAST  TECHNIQUE: Multiplanar, multiecho pulse sequences of the brain and surrounding structures were obtained without intravenous contrast. Angiographic images of the head were obtained using MRA technique without contrast.  COMPARISON:  CT head 05/08/2014  FINDINGS: MRI HEAD FINDINGS  Moderate atrophy. Negative for hydrocephalus. Craniocervical junction normal. Pituitary normal in size.  Negative for acute infarct. Mild to moderate chronic microvascular ischemic change in the white matter. Mild chronic ischemia in the pons. No cortical infarct  Negative for intracranial hemorrhage.  Negative for mass or edema.  Temporal lobe atrophy. Medial temporal lobe is symmetric in volume with symmetric normal signal.  MRA HEAD FINDINGS  Left vertebral artery widely patent to the basilar. Right vertebral artery ends in PICA. Left PICA not  visualized. Basilar is widely patent. Superior cerebellar and posterior cerebral arteries are patent bilaterally without stenosis  Cavernous carotid widely patent bilaterally. Anterior and middle cerebral arteries patent. Mild stenosis of the parietal branch of the right middle cerebral artery.  Negative for cerebral aneurysm.  IMPRESSION: Moderate atrophy.  Mild to moderate chronic microvascular ischemia.  Negative for acute infarct or mass  Mild stenosis right middle cerebral artery branch. No flow limiting intracranial stenosis.   Electronically Signed   By: Franchot Gallo [name]   On: 05/08/2014 16:06   [name] Contrast  05/08/2014   CLINICAL DATA:  Confusion. Alcohol abuse. Seizure disorder. Hypertension.  EXAM: MRI HEAD WITHOUT CONTRAST  MRA HEAD WITHOUT CONTRAST  TECHNIQUE: Multiplanar, multiecho pulse sequences of the brain and surrounding structures were obtained without intravenous contrast. Angiographic images of the head were obtained using MRA technique without contrast.  COMPARISON:  CT head 05/08/2014  FINDINGS: MRI HEAD FINDINGS  Moderate atrophy. Negative for hydrocephalus. Craniocervical junction normal. Pituitary normal in size.  Negative for acute infarct. Mild to moderate chronic microvascular ischemic change in the white matter. Mild chronic ischemia in the pons. No cortical infarct  Negative for intracranial hemorrhage.  Negative for mass or edema.  Temporal lobe atrophy. Medial temporal lobe is symmetric in volume with symmetric normal signal.  MRA HEAD FINDINGS  Left vertebral artery widely patent to the basilar. Right vertebral artery ends in PICA. Left PICA not visualized. Basilar is widely patent. Superior cerebellar and posterior cerebral arteries are patent bilaterally without stenosis  Cavernous carotid widely patent bilaterally. Anterior and middle cerebral arteries patent. Mild stenosis of the parietal branch of the right middle cerebral artery.  Negative for cerebral aneurysm.   IMPRESSION: Moderate atrophy.  Mild to moderate chronic microvascular ischemia.  Negative for acute infarct or mass  Mild stenosis right middle cerebral artery branch. No flow limiting intracranial stenosis.   Electronically Signed   By: Franchot Gallo [name]   On: 05/08/2014 16:06   EKG 3/9 >>> ST, QTc 518.  ASSESSMENT / PLAN:  NEUROLOGIC A:   ETOH withdrawawl Wernicke's encephalopathy / Korsakoff Syndrome Seizure disorder with EEG raising concern for ? ongoing subclinical seizures [name] - resolved ETOH and drug abuse (UDS positive for marijuana and benzo's - did receive Versed with EMS) Hx PTSD, depression P:   Precedex gtt. No antipsychotics given prolonged QTc. Thiamine / Folate / Multivitamin. Continue Keppra per neuro recs. Neurology following. Substance abuse counseling.  CARDIOVASCULAR A:  Hx HTN, prolonged QTc, chronic diastolic heart failure (echo from 05/08/14 with EF 55-60%, grade 1  DD) P:  Lopressor scheduled, Hydralazine PRN. Avoid haldol and other QT prolonging drugs.  PULMONARY A: No acute issues - protecting airway on own P:   Monitor for deterioration.  RENAL A:   Hypomagnesemia (1.3) - repleted  P:   NS @ 75. BMP in AM.  GASTROINTESTINAL A:   Elevated AST (> 2:1 compared to ALT) - c/w ETOH abuse GI prophylaxis Nutrition P:   SUP: Pantoprazole. NPO.  HEMATOLOGIC A:   Macrocytic anemia - due to ETOH Thrombocytopenia - likely due to ETOH VTE Prophylaxis P:  Transfuse per usual ICU guidelines. SCD's. CBC in AM.  INFECTIOUS A:   No indication of infection P:   Monitor clinically.  ENDOCRINE A:   No known issues  P:   No interventions required.   Family updated: None.  Interdisciplinary Family Meeting v Palliative Care Meeting:  Due by: 3/15.   Montey Hora, [place] [facility] Pager: [phone]  or [phone] 05/09/2014, 10:11 PM  STAFF NOTE: I, Merrie Roof, MD FACP  have personally reviewed patient's available data, including medical history, events of note, physical examination and test results as part of my evaluation. I have discussed with resident/NP and other care providers such as pharmacist, RN and RRT. In addition, I personally evaluated patient and elicited key findings of: severe ETOH WD, start precedex and limit further benzo that will suppress resp drive, max precedex 2.0 if hemdynnamics tolerate, assesslytes, mg, phoos, k, make npo, continued IV thiamine, if required could add IV haldol after qt assessment but can lower seziure threshold and last qtc 514, would be reluctant, goal ess 550 however, NO role BIPAP, MRI, CT reviewed, could aphasia be todds paralysis?, follw neurostatus, keppra The patient is critically ill with multiple organ systems failure and requires high complexity decision making for assessment and support, frequent evaluation and titration of therapies, application of advanced monitoring technologies and extensive interpretation of multiple databases.   Critical Care Time devoted to patient care services described in this note is 30  Minutes. This time reflects time of care of this signee: Merrie Roof, MD FACP. This critical care time does not reflect procedure time, or teaching time or supervisory time of PA/NP/Med student/Med Resident etc but could involve care discussion time. Rest per NP/medical resident whose note is outlined above and that I agree with   Lavon Paganini. Titus Mould, MD, [place] Pgr: [place] [facility] 05/09/2014 10:50 PM

## 2014-05-09 NOTE — Progress Notes (Addendum)
  [facility] TEAM 1 - Stepdown/ICU TEAM  Contacted by RN who reported increased confusion over course of the day.  Came to see pt in person.  Pt now s/p 7mg  IV ativan w/ no visible sedation/response whatsoever.  He is agitated and combative, attempting to get out of bed and resisting correction from staff.  Unable to dose w/ antipsychotics due to prolonged QTc.  Discussed w/ PCCM, who agrees w/ precedex gtt, but is unable to evaluate pt at this time due to a high volume of acutely ill patients.  I have agreed to enter orders for precedex, and transfer.    PCCM will assume care by the morning of 05/10/14, if not sooner, and the pt will be monitored via VISICU.  Cherene Altes, MD [facility] For Consults/Admissions - Flow Manager [phone] Office  [phone]  Contact MD directly via text page:      [email or web address]      password [facility]

## 2014-05-09 NOTE — Progress Notes (Signed)
INITIAL NUTRITION ASSESSMENT  DOCUMENTATION CODES Per approved criteria  -Severe malnutrition in the context of chronic illness   Pt meets criteria for severe MALNUTRITION in the context of chronic illness as evidenced by severe fat and muscle depletion.  INTERVENTION: Ensure Complete po BID, each supplement provides 350 kcal and 13 grams of protein  NUTRITION DIAGNOSIS: Malnutrition related to decreased oral intake, ETOH abuse as evidenced by severe fat and muscle depletion.   Goal: Pt will meet >90% of estimated nutritional needs  Monitor:  PO/supplement intake, labs, weight changes, I/O's  Reason for Assessment: MST=2  69 y.o. male  Admitting Dx: Expressive aphasia  Miguel Hanson is a 69 y.o. male, with known history of ongoing alcohol abuse as well as occasional use of marijuana, he also has hypertension and seizure disorder (most likely related to alcohol withdrawal), recent admission for hepatic encephalopathy as well as ongoing issues related to depression and PTSD. He was sent to the ER by his family via EMS after they noticed he was confused with garbled speech and focal seizure-like activity on the right. Upon arrival of EMS to the scene they noticed he was having difficulty speaking consistant with expressive aphasia and appeared to have some right arm/leg weakness as well as witnessed myoclonic jerking of his facial muscles  ASSESSMENT: Pt admitted with seizures. He has a hx of heavy ETOH abuse. Pt was unable to answer questions appropriately. Pt is with poor to fair appetite. Noted 50% meal completion. Pt consumed about 25% of his breakfast tray. Suspect poor po intake PTA due to alcoholism.  Wt hx reveals a 7.2% wt loss in the past 2-3 months.  Labs reviewed. Mg: 1.3, Glucose: 118.  Nutrition Focused Physical Exam:  Subcutaneous Fat:  Orbital Region: severe depletion Upper Arm Region: moderate depletion Thoracic and Lumbar Region: severe depletion  Muscle:   Temple Region: moderate depletion Clavicle Bone Region: severe depletion Clavicle and Acromion Bone Region: severe depletion Scapular Bone Region: severe depletion Dorsal Hand: severe depletion Patellar Region: severe depletion Anterior Thigh Region: moderate depletion Posterior Calf Region: moderate depletion  Edema: none present  Height: Ht Readings from Last 1 Encounters:  05/08/14 5\' 6"  (1.676 m)    Weight: Wt Readings from Last 1 Encounters:  05/08/14 110 lb 3.7 oz (50 kg)    Ideal Body Weight: 142#  % Ideal Body Weight: 77%  Wt Readings from Last 10 Encounters:  05/08/14 110 lb 3.7 oz (50 kg)  05/08/14 120 lb (54.432 kg)  02/27/14 118 lb 13.3 oz (53.9 kg)  12/17/13 116 lb (52.617 kg)  03/03/13 116 lb 4.8 oz (52.753 kg)  12/12/12 116 lb 13.5 oz (53 kg)  05/15/12 111 lb (50.349 kg)  12/18/11 106 lb 3.2 oz (48.172 kg)  09/15/11 124 lb (56.246 kg)  08/11/11 124 lb 5.4 oz (56.4 kg)    Usual Body Weight: 124#  % Usual Body Weight: 89%  BMI:  Body mass index is 17.8 kg/(m^2). Underweight  Estimated Nutritional Needs: Kcal: 1500-1700 Protein: 60-70 grams Fluid: 1.5-1.7 L  Skin: Intact  Diet Order: Diet Heart  EDUCATION NEEDS: -Education not appropriate at this time   Intake/Output Summary (Last 24 hours) at 05/09/14 1103 Last data filed at 05/09/14 1000  Gross per 24 hour  Intake 1946.25 ml  Output    200 ml  Net 1746.25 ml    Last BM: PTA  Labs:   Recent Labs Lab 05/08/14 0845 05/08/14 0855 05/08/14 1709  NA 136 136  --  K 4.0 4.0  --   CL 100 99  --   CO2 26  --   --   BUN 10 9  --   CREATININE 0.64 0.60  --   CALCIUM 9.0  --   --   MG  --   --  1.3*  GLUCOSE 95 96  --     CBG (last 3)   Recent Labs  05/08/14 0922  GLUCAP 118*    Scheduled Meds: .  stroke: mapping our early stages of recovery book   Does not apply Once  . aspirin EC  325 mg Oral Daily  . folic acid  1 mg Intravenous Daily  . levETIRAcetam  1,000 mg  Intravenous STAT  . levETIRAcetam  500 mg Intravenous Q12H  . magnesium sulfate 1 - 4 g bolus IVPB  2 g Intravenous Once  . thiamine  100 mg Oral Daily   Or  . thiamine  100 mg Intravenous Daily    Continuous Infusions: . sodium chloride 75 mL/hr at 05/09/14 0222    Past Medical History  Diagnosis Date  . Hypertension   . Seizures   . ETOH abuse   . Drug abuse   . PTSD (post-traumatic stress disorder)   . H/O prolonged Q-T interval on ECG   . Polysubstance abuse   . Alcohol abuse   . Depression     Past Surgical History  Procedure Laterality Date  . No past surgeries      Erasmo Vertz A. Jimmye Norman, RD, LDN, CDE Pager: [phone] After hours Pager: [phone]

## 2014-05-09 NOTE — Progress Notes (Addendum)
Received prescreen request for inpatient rehab from PT and have reviewed pt's case. Pt adm with expressive aphasia and focal seizure-like activity on the right. CT and MRI negative per neuro note. Pt has [employer] Medicare and based on pt's current seizure diagnosis, it is not likely that his insurance would give authorization for inpatient rehab. If further work up reveals CVA, we can consider possible CIR consult.  At this time, we are recommending that skilled nursing or home with home health support be pursued.   Thanks.  Nanetta Batty, PT Rehabilitation Admissions Coordinator [phone]

## 2014-05-09 NOTE — Care Management Note (Addendum)
    Page 1 of 1   05/21/2014     2:10:04 PM CARE MANAGEMENT NOTE 05/21/2014  Patient:  Miguel Hanson, Miguel Hanson   Account Number:  [id number]  Date Initiated:  05/09/2014  Documentation initiated by:  Elissa Hefty  Subjective/Objective Assessment:   adm w expressive aphasia, seizure     Action/Plan:   lives w wife who is w/c bound, pcp dr sara [place]   Anticipated DC Date:  05/21/2014   Anticipated DC Plan:  [place] referral  Clinical Social Worker         Choice offered to / List presented to:             Status of service:   Medicare Important Message given?  YES (If response is "NO", the following Medicare IM given date fields will be blank) Date Medicare IM given:  05/18/2014 Medicare IM given by:  Elissa Hefty Date Additional Medicare IM given:  05/21/2014 Additional Medicare IM given by:  Magdalen Spatz  Discharge Disposition:  [place]  Per UR Regulation:  Reviewed for med. necessity/level of care/duration of stay  If discussed at [place] of Stay Meetings, dates discussed:   05/15/2014    Comments:  05-18-14 PT recommending SNF for discharge plan . SW following patient. Magdalen Spatz RN BSN    05-16-14 10:15am Luz Lex, RNBSN [phone] Off precedex - moving to Med surg unit today.  Continues on IV Keppra.  SW working with for SNF placement.  3/11 0938 debbie dowell rn,bsn will make sw ref. pt in room talking to self. hx etoh.

## 2014-05-09 NOTE — Progress Notes (Signed)
Spoke with charge nurse in 3100 unit  And requested  For NIH stroke scale to be done as ordered every shift, claimed to be coming to do it.

## 2014-05-10 ENCOUNTER — Inpatient Hospital Stay (HOSPITAL_COMMUNITY): Payer: Medicare Other

## 2014-05-10 DIAGNOSIS — R400 Somnolence: Secondary | ICD-10-CM

## 2014-05-10 DIAGNOSIS — J410 Simple chronic bronchitis: Secondary | ICD-10-CM

## 2014-05-10 LAB — COMPREHENSIVE METABOLIC PANEL
ALT: 26 U/L (ref 0–53)
AST: 45 U/L — AB (ref 0–37)
Albumin: 2.9 g/dL — ABNORMAL LOW (ref 3.5–5.2)
Alkaline Phosphatase: 67 U/L (ref 39–117)
Anion gap: 6 (ref 5–15)
BUN: <5 mg/dL — ABNORMAL LOW (ref 6–23)
CALCIUM: 8.2 mg/dL — AB (ref 8.4–10.5)
CHLORIDE: 108 mmol/L (ref 96–112)
CO2: 25 mmol/L (ref 19–32)
CREATININE: 0.47 mg/dL — AB (ref 0.50–1.35)
GFR calc Af Amer: >90 mL/min (ref >90)
Glucose, Bld: 101 mg/dL — ABNORMAL HIGH (ref 70–99)
Potassium: 3.2 mmol/L — ABNORMAL LOW (ref 3.5–5.1)
Sodium: 139 mmol/L (ref 135–145)
Total Bilirubin: 1 mg/dL (ref 0.3–1.2)
Total Protein: 5.9 g/dL — ABNORMAL LOW (ref 6.0–8.3)

## 2014-05-10 LAB — CBC
HEMATOCRIT: 27.9 % — AB (ref 39.0–52.0)
Hemoglobin: 10.2 g/dL — ABNORMAL LOW (ref 13.0–17.0)
MCH: 38.5 pg — ABNORMAL HIGH (ref 26.0–34.0)
MCHC: 36.6 g/dL — AB (ref 30.0–36.0)
MCV: 105.3 fL — AB (ref 78.0–100.0)
PLATELETS: 87 10*3/uL — AB (ref 150–400)
RBC: 2.65 MIL/uL — ABNORMAL LOW (ref 4.22–5.81)
RDW: 19.5 % — ABNORMAL HIGH (ref 11.5–15.5)
WBC: 6.3 10*3/uL (ref 4.0–10.5)

## 2014-05-10 LAB — MAGNESIUM: Magnesium: 1.6 mg/dL (ref 1.5–2.5)

## 2014-05-10 LAB — HEMOGLOBIN A1C
Hgb A1c MFr Bld: 5.3 % (ref 4.8–5.6)
Mean Plasma Glucose: 105 mg/dL

## 2014-05-10 MED ORDER — MAGNESIUM SULFATE 2 GM/50ML IV SOLN
2.0000 g | Freq: Once | INTRAVENOUS | Status: AC
  Administered 2014-05-10: 2 g via INTRAVENOUS
  Filled 2014-05-10: qty 50

## 2014-05-10 MED ORDER — LEVETIRACETAM IN NACL 1000 MG/100ML IV SOLN
1000.0000 mg | Freq: Two times a day (BID) | INTRAVENOUS | Status: DC
  Administered 2014-05-10 – 2014-05-12 (×6): 1000 mg via INTRAVENOUS
  Filled 2014-05-10 (×8): qty 100

## 2014-05-10 MED ORDER — CLONIDINE HCL 0.1 MG/24HR TD PTWK
0.1000 mg | MEDICATED_PATCH | TRANSDERMAL | Status: DC
  Administered 2014-05-10: 0.1 mg via TRANSDERMAL
  Filled 2014-05-10: qty 1

## 2014-05-10 MED ORDER — DEXMEDETOMIDINE HCL IN NACL 200 MCG/50ML IV SOLN
0.2000 ug/kg/h | INTRAVENOUS | Status: AC
  Administered 2014-05-10: 0.7 ug/kg/h via INTRAVENOUS
  Administered 2014-05-11: 0.6 ug/kg/h via INTRAVENOUS
  Administered 2014-05-11: 0.7 ug/kg/h via INTRAVENOUS
  Administered 2014-05-11: 0.6 ug/kg/h via INTRAVENOUS
  Filled 2014-05-10 (×4): qty 50

## 2014-05-10 MED ORDER — LORAZEPAM 2 MG/ML IJ SOLN
1.0000 mg | Freq: Four times a day (QID) | INTRAMUSCULAR | Status: DC
  Administered 2014-05-10 – 2014-05-11 (×4): 1 mg via INTRAVENOUS
  Filled 2014-05-10 (×4): qty 1

## 2014-05-10 MED ORDER — POTASSIUM CHLORIDE 10 MEQ/100ML IV SOLN
10.0000 meq | INTRAVENOUS | Status: AC
Start: 2014-05-10 — End: 2014-05-10
  Administered 2014-05-10 (×4): 10 meq via INTRAVENOUS
  Filled 2014-05-10 (×2): qty 100

## 2014-05-10 NOTE — Progress Notes (Signed)
NEURO HOSPITALIST PROGRESS NOTE   SUBJECTIVE:                                                                                                                        Agitated last night and started on precedex.  No further seizure like activity noted. On IV keppra 500 mg BID. Thiamine.  OBJECTIVE:                                                                                                                           Vital signs in last 24 hours: Temp:  [95.9 F (35.5 C)-97.8 F (36.6 C)] 95.9 F (35.5 C) (03/09 2300) Pulse Rate:  [59-107] 60 (03/10 0500) Resp:  [16-28] 25 (03/10 0700) BP: (119-198)/(74-132) 160/94 mmHg (03/10 0700) SpO2:  [94 %-100 %] 98 % (03/10 0500)  Intake/Output from previous day: 03/09 0701 - 03/10 0700 In: 2197.3 [P.O.:390; I.V.:1657.3; IV Piggyback:150] Out: 300 [Urine:300] Intake/Output this shift:   Nutritional status: Diet NPO time specified  Past Medical History  Diagnosis Date  . Hypertension   . Seizures   . ETOH abuse   . Drug abuse   . PTSD (post-traumatic stress disorder)   . H/O prolonged Q-T interval on ECG   . Polysubstance abuse   . Alcohol abuse   . Depression   Physical exam: pleasant male in no apparent distress. Blood pressure 148/94, pulse 74, temperature 98.6 F (37 C), temperature source Oral, resp. rate 19, height 5\' 6"  (1.676 m), weight 54.432 kg (120 lb), SpO2 98 % Head: normocephalic. Neck: supple, no bruits, no JVD. Cardiac: no murmurs. Lungs: clear. Abdomen: soft, no tender, no mass. Extremities: no edema. Skin: no rash   Neurologic Exam:  General: Mental Status: PATIENT ON PRECEDEX sedated, perseverates, follows simple commands but inconsistently.  Cranial Nerves: II: Discs were not assesed; Visual fields grossly normal, pupils equal, round, reactive to light and accommodation III,IV, VI: ptosis not present, extra-ocular motions intact bilaterally V,VII: smile  symmetric, facial light touch sensation normal bilaterally VIII: hearing normal bilaterally IX,X: uvula rises symmetrically XI: bilateral shoulder shrug XII: midline tongue extension without atrophy or fasciculations Motor: Moves all limbs symmetrically Sensory: Pinprick and light touch intact unreliable due to confusion Deep Tendon Reflexes:  2 in the upper extremities, 1+ patella bilaterally.  Plantars: Right: downgoingLeft: downgoing Cerebellar: Unable to test as patient is not following commands consistently. Gait:  No tested due to safety reasons.   Lab Results: Lab Results  Component Value Date/Time   CHOL 150 05/09/2014 03:38 AM   Lipid Panel  Recent Labs  05/09/14 0338  CHOL 150  TRIG 94  HDL 68  CHOLHDL 2.2  VLDL 19  LDLCALC 63    Studies/Results: Ct Head Wo Contrast  05/08/2014   CLINICAL DATA:  69 year old male with seizure-like activity on right side. History seizures. Slurred speech. Alcohol consumption last evening. History of drug use and alcohol abuse. Initial encounter.  EXAM: CT HEAD WITHOUT CONTRAST  TECHNIQUE: Contiguous axial images were obtained from the base of the skull through the vertex without intravenous contrast.  COMPARISON:  02/25/2014 brain MR and head CT.  FINDINGS: No intracranial hemorrhage.  Small vessel disease type changes without CT evidence of large acute infarct.  Global atrophy without hydrocephalus.  No intracranial mass lesion noted on this unenhanced exam.  Vascular calcifications.  Left sphenoid sinus chronic sinusitis type changes. Additionally air-fluid level raise possibility of acute sinusitis.  Complete opacification left frontal sinus without bony expansion. Appearance unchanged.  IMPRESSION: No intracranial hemorrhage.  Small vessel disease type changes without CT evidence of large acute infarct.  Global atrophy without hydrocephalus.  Left sphenoid sinus chronic sinusitis type changes.  Additionally air-fluid level raise possibility of acute sinusitis.  Complete opacification left frontal sinus without bony expansion. Appearance unchanged.   Electronically Signed   By: Genia Del [name]   On: 05/08/2014 10:00   Mr Jodene Nam Head Wo Contrast  05/08/2014   CLINICAL DATA:  Confusion. Alcohol abuse. Seizure disorder. Hypertension.  EXAM: MRI HEAD WITHOUT CONTRAST  MRA HEAD WITHOUT CONTRAST  TECHNIQUE: Multiplanar, multiecho pulse sequences of the brain and surrounding structures were obtained without intravenous contrast. Angiographic images of the head were obtained using MRA technique without contrast.  COMPARISON:  CT head 05/08/2014  FINDINGS: MRI HEAD FINDINGS  Moderate atrophy. Negative for hydrocephalus. Craniocervical junction normal. Pituitary normal in size.  Negative for acute infarct. Mild to moderate chronic microvascular ischemic change in the white matter. Mild chronic ischemia in the pons. No cortical infarct  Negative for intracranial hemorrhage.  Negative for mass or edema.  Temporal lobe atrophy. Medial temporal lobe is symmetric in volume with symmetric normal signal.  MRA HEAD FINDINGS  Left vertebral artery widely patent to the basilar. Right vertebral artery ends in PICA. Left PICA not visualized. Basilar is widely patent. Superior cerebellar and posterior cerebral arteries are patent bilaterally without stenosis  Cavernous carotid widely patent bilaterally. Anterior and middle cerebral arteries patent. Mild stenosis of the parietal branch of the right middle cerebral artery.  Negative for cerebral aneurysm.  IMPRESSION: Moderate atrophy.  Mild to moderate chronic microvascular ischemia.  Negative for acute infarct or mass  Mild stenosis right middle cerebral artery branch. No flow limiting intracranial stenosis.   Electronically Signed   By: Franchot Gallo [name]   On: 05/08/2014 16:06   [name] Contrast  05/08/2014   CLINICAL DATA:  Confusion. Alcohol abuse. Seizure disorder.  Hypertension.  EXAM: MRI HEAD WITHOUT CONTRAST  MRA HEAD WITHOUT CONTRAST  TECHNIQUE: Multiplanar, multiecho pulse sequences of the brain and surrounding structures were obtained without intravenous contrast. Angiographic images of the head were obtained using MRA technique without contrast.  COMPARISON:  CT head 05/08/2014  FINDINGS: MRI HEAD  FINDINGS  Moderate atrophy. Negative for hydrocephalus. Craniocervical junction normal. Pituitary normal in size.  Negative for acute infarct. Mild to moderate chronic microvascular ischemic change in the white matter. Mild chronic ischemia in the pons. No cortical infarct  Negative for intracranial hemorrhage.  Negative for mass or edema.  Temporal lobe atrophy. Medial temporal lobe is symmetric in volume with symmetric normal signal.  MRA HEAD FINDINGS  Left vertebral artery widely patent to the basilar. Right vertebral artery ends in PICA. Left PICA not visualized. Basilar is widely patent. Superior cerebellar and posterior cerebral arteries are patent bilaterally without stenosis  Cavernous carotid widely patent bilaterally. Anterior and middle cerebral arteries patent. Mild stenosis of the parietal branch of the right middle cerebral artery.  Negative for cerebral aneurysm.  IMPRESSION: Moderate atrophy.  Mild to moderate chronic microvascular ischemia.  Negative for acute infarct or mass  Mild stenosis right middle cerebral artery branch. No flow limiting intracranial stenosis.   Electronically Signed   By: Franchot Gallo [name]   On: 05/08/2014 16:06    MEDICATIONS                                                                                                                        Scheduled: . folic acid  1 mg Intravenous Daily  . levETIRAcetam  500 mg Intravenous Q12H  . metoprolol  5 mg Intravenous 4 times per day  . thiamine  100 mg Intravenous Daily    ASSESSMENT/PLAN:                                                                                                            69 y.o. Male with alcohol abuse brought in with confusion, right sided weakness (resolved), witnessed myoclonic jerking of his facial muscles on the right, right arm and right leg in the ambulance that lasted over 5 minutes, trouble speaking. CT brain unremarkable for acute abnormality. MRI brain without acute abnormality. EEG with focal slowing in the hemisphere region on the left and intermittent sharp activity of triphasic morphology, most notable in the left occipital region. His mental status is not really improved and thus concern now that EEG findings in this particular context could be indicative of subclinical seizures. Wernicke's encephalopathy/korsakoff syndrome, ? Subclinical seizures. Increase keppra 1 gr BID. Follow up EEG. Will continue to follow.   Dorian Pod, MD [facility] [phone]  05/10/2014, 8:04 AM

## 2014-05-10 NOTE — Progress Notes (Signed)
EEG completed, results pending. 

## 2014-05-10 NOTE — Progress Notes (Signed)
PULMONARY / CRITICAL CARE MEDICINE   Name: Miguel Hanson MRN: [id number] DOB: August 09, 1945    ADMISSION DATE:  05/08/2014 CONSULTATION DATE:  05/10/2014  REFERRING MD :  Thereasa Solo  CHIEF COMPLAINT:  ETOH withdrawal  INITIAL PRESENTATION:  69 y.o. M brought to [facility] ED 3/8 for confusion, garbled speech, and focal seizure like activity. He was admitted for AMS, seizure disorder, ETOH abuse.  On 3/9, he remained agitated despite high doses of ativan.  He was transferred to ICU and started on precedex.   STUDIES:  CT head 3/8 >>> Small vessel disease, global atrophy without hydrocephalus, air fluid level ? Sinusitis.  MRI / MRA brain 3/8 >>> No acute infarct or mass.  Moderate atrophy, mild to mod chronic microvascular ischemia.  Mild stenosis right MCA branch.  SIGNIFICANT EVENTS: 3/8 - admit 3/9 - transferred to ICU, started on precedex  SUBJECTIVE: Wakes to mild stimulation, answers questions, follows some commands.   VITAL SIGNS: Temp:  [95.9 F (35.5 C)-97.8 F (36.6 C)] 95.9 F (35.5 C) (03/09 2300) Pulse Rate:  [59-107] 60 (03/10 0500) Resp:  [16-23] 22 (03/10 0500) BP: (119-198)/(74-132) 146/88 mmHg (03/10 0500) SpO2:  [94 %-100 %] 98 % (03/10 0500)      INTAKE / OUTPUT: Intake/Output      03/09 0701 - 03/10 0700 03/10 0701 - 03/11 0700   P.O. 390    I.V. (mL/kg) 1335.2 (26.7)    IV Piggyback 50    Total Intake(mL/kg) 1775.2 (35.5)    Urine (mL/kg/hr) 300 (0.3)    Stool 0 (0)    Total Output 300     Net +1475.2          Urine Occurrence 4 x    Stool Occurrence 1 x      PHYSICAL EXAMINATION: General: Thin, elderly appearing AA male, calm, sedated.  Neuro: Somnolent, answers questions. RASS 0. HEENT: [place]/AT. PERRL, sclerae anicteric. Cardiovascular: RRR, no M/R/G.  Lungs: Air entry equal bilaterally, scattered wheeze. No rales or rhonchi.  Abdomen: BS x 4, soft, NT/ND.  Musculoskeletal: No gross deformities, no edema.  Skin: Intact, warm, no  rashes.  LABS:  CBC  Recent Labs Lab 05/08/14 0845 05/08/14 0855 05/10/14 0535  WBC 7.4  --  6.3  HGB 11.2* 12.2* 10.2*  HCT 31.0* 36.0* 27.9*  PLT 99*  --  87*   Coag's  Recent Labs Lab 05/08/14 0845  APTT 34  INR 1.10   BMET  Recent Labs Lab 05/08/14 0845 05/08/14 0855  NA 136 136  K 4.0 4.0  CL 100 99  CO2 26  --   BUN 10 9  CREATININE 0.64 0.60  GLUCOSE 95 96   Electrolytes  Recent Labs Lab 05/08/14 0845 05/08/14 1709  CALCIUM 9.0  --   MG  --  1.3*   Liver Enzymes  Recent Labs Lab 05/08/14 0845  AST 105*  ALT 34  ALKPHOS 89  BILITOT 0.7  ALBUMIN 3.6   Glucose  Recent Labs Lab 05/08/14 0922  GLUCAP 118*    Imaging No results found.   EKG 3/9 >>> ST, QTc 518.  ASSESSMENT / PLAN:  NEUROLOGIC A:   ETOH withdrawawl Wernicke's encephalopathy / Korsakoff Syndrome Seizure disorder with EEG raising concern for ? ongoing subclinical seizures [name] - resolved ETOH and drug abuse (UDS positive for marijuana and benzo's - did receive Versed with EMS) Hx PTSD, depression P:   Continue Precedex gtt (@ 0.5 mcg/kg/hr) No antipsychotics given prolonged QTc Thiamine/Folate IV Continue  [facility] following Substance abuse counseling  CARDIOVASCULAR A:  HTN Prolonged QTc  Chronic diastolic heart failure (ECHO from 05/08/14 with EF 55-60%, grade 1 DD) P:  Lopressor 5 mg IV q6h Hydralazine PRN. Avoid Haldol and other QT prolonging drugs  PULMONARY A: No acute issues, protecting airway P:   Monitor for deterioration  RENAL A:   Hypomagnesemia P:   NS @ 75 cc/hr BMP pending  GASTROINTESTINAL A:   Elevated AST c/w ETOH abuse GI prophylaxis Nutrition P:   Protonix NPO  HEMATOLOGIC A:   Macrocytic anemia 2/2 alcohol abuse Thrombocytopenia 2/2 alcohol abuse; worsening VTE Prophylaxis P:  SCD's CBC in AM  INFECTIOUS A:   No acute issues P:   Monitor clinically  ENDOCRINE A:   No acute  issues P:   No interventions required   Family updated: None.  Interdisciplinary Family Meeting v Palliative Care Meeting:  Due by: 3/15.   Natasha Bence, MD PGY-2, Internal Medicine Pager: [phone]  STAFF NOTE: Linwood Dibbles, MD FACP have personally reviewed patient's available data, including medical history, events of note, physical examination and test results as part of my evaluation. I have discussed with resident/NP and other care providers such as pharmacist, RN and RRT. In addition, I personally evaluated patient and elicited key findings of: Imporved and calmer on precedex 0.5, maintain and titrate down as able, eeg on going per neuro, keppra, awaity lytes labs, add ativan 1 mg IV q6h in hopes to dc precedex, NO haldol planned, may need NGT and feeds, SLP required thimaine keep IV, add clonidine The patient is critically ill with multiple organ systems failure and requires high complexity decision making for assessment and support, frequent evaluation and titration of therapies, application of advanced monitoring technologies and extensive interpretation of multiple databases.   Critical Care Time devoted to patient care services described in this note is30  Minutes. This time reflects time of care of this signee: Merrie Roof, MD FACP. This critical care time does not reflect procedure time, or teaching time or supervisory time of PA/NP/Med student/Med Resident etc but could involve care discussion time. Rest per NP/medical resident whose note is outlined above and that I agree with   Lavon Paganini. Titus Mould, MD, [place] Pgr: [facility] 05/10/2014 9:12 AM

## 2014-05-10 NOTE — Procedures (Signed)
ELECTROENCEPHALOGRAM REPORT   Patient: Miguel Hanson       Room #: 1S01 EEG No. ID: [id number] Age: 69 y.o.        Sex: male Referring Physician: Titus Mould Report Date:  05/10/2014        Interpreting Physician: Alexis Goodell  History: Miguel Hanson is an 69 y.o. male with a history of seizures presenting after family witnessed right sided tonic-clonic activity, now with altered mental status   Medications:  Scheduled: . cloNIDine  0.1 mg Transdermal Weekly  . folic acid  1 mg Intravenous Daily  . levETIRAcetam  1,000 mg Intravenous Q12H  . LORazepam  1 mg Intravenous Q6H  . metoprolol  5 mg Intravenous 4 times per day  . thiamine  100 mg Intravenous Daily    Conditions of Recording:  This is a 16 channel EEG carried out with the patient in the confused and sedated state.  Description:  The patient is uncooperative for the majority of the tracing despite sedation.  The background activity is dominated by muscle and movement artifact.  This artifact is superimposed on a very low voltage background rhythm.  This background activity is of higher voltage on the left hemisphere where there is noted an underlying polymorphic delta activity that is persistent throughout the recording.  Also noted over the left hemisphere is low voltage intermittent periodic discharges that are difficult to characterize due to the artifact but appear to have a triphasic morphology.  They are at an approximate frequency of one per second and are persistent throughout the recording as well.  Hyperventilation and intermittent photic stimulation were not performed.  IMPRESSION: This is a technically difficult tracing secondary to the predominance of muscle and movement artifact.  There does appear to be continued left hemispheric slowing and triphasic waves as seen on the previous electroencephalogram of 05/08/2014.     Alexis Goodell, MD [facility] [phone] 05/10/2014, 10:42 AM

## 2014-05-11 DIAGNOSIS — F10231 Alcohol dependence with withdrawal delirium: Secondary | ICD-10-CM | POA: Insufficient documentation

## 2014-05-11 DIAGNOSIS — F10931 Alcohol use, unspecified with withdrawal delirium: Secondary | ICD-10-CM | POA: Insufficient documentation

## 2014-05-11 DIAGNOSIS — G9340 Encephalopathy, unspecified: Secondary | ICD-10-CM

## 2014-05-11 LAB — CBC WITH DIFFERENTIAL/PLATELET
Basophils Absolute: 0 10*3/uL (ref 0.0–0.1)
Basophils Relative: 0 % (ref 0–1)
EOS PCT: 2 % (ref 0–5)
Eosinophils Absolute: 0.2 10*3/uL (ref 0.0–0.7)
HCT: 30.5 % — ABNORMAL LOW (ref 39.0–52.0)
Hemoglobin: 11 g/dL — ABNORMAL LOW (ref 13.0–17.0)
LYMPHS ABS: 1.8 10*3/uL (ref 0.7–4.0)
Lymphocytes Relative: 17 % (ref 12–46)
MCH: 37.9 pg — ABNORMAL HIGH (ref 26.0–34.0)
MCHC: 36.1 g/dL — ABNORMAL HIGH (ref 30.0–36.0)
MCV: 105.2 fL — AB (ref 78.0–100.0)
MONO ABS: 0.6 10*3/uL (ref 0.1–1.0)
Monocytes Relative: 6 % (ref 3–12)
Neutro Abs: 8.3 10*3/uL — ABNORMAL HIGH (ref 1.7–7.7)
Neutrophils Relative %: 75 % (ref 43–77)
Platelets: 102 10*3/uL — ABNORMAL LOW (ref 150–400)
RBC: 2.9 MIL/uL — AB (ref 4.22–5.81)
RDW: 19.2 % — ABNORMAL HIGH (ref 11.5–15.5)
WBC: 11 10*3/uL — ABNORMAL HIGH (ref 4.0–10.5)

## 2014-05-11 LAB — BASIC METABOLIC PANEL
Anion gap: 10 (ref 5–15)
CALCIUM: 8.2 mg/dL — AB (ref 8.4–10.5)
CO2: 21 mmol/L (ref 19–32)
Chloride: 108 mmol/L (ref 96–112)
Creatinine, Ser: 0.57 mg/dL (ref 0.50–1.35)
GFR calc Af Amer: >90 mL/min (ref >90)
GFR calc non Af Amer: >90 mL/min (ref >90)
GLUCOSE: 83 mg/dL (ref 70–99)
Potassium: 3.7 mmol/L (ref 3.5–5.1)
Sodium: 139 mmol/L (ref 135–145)

## 2014-05-11 LAB — MAGNESIUM: Magnesium: 1.7 mg/dL (ref 1.5–2.5)

## 2014-05-11 MED ORDER — POTASSIUM CHLORIDE 10 MEQ/100ML IV SOLN
10.0000 meq | INTRAVENOUS | Status: AC
  Administered 2014-05-11 (×2): 10 meq via INTRAVENOUS
  Filled 2014-05-11 (×2): qty 100

## 2014-05-11 MED ORDER — LORAZEPAM 2 MG/ML IJ SOLN
1.0000 mg | INTRAMUSCULAR | Status: DC
  Administered 2014-05-11 – 2014-05-13 (×4): 2 mg via INTRAVENOUS
  Administered 2014-05-13: 1 mg via INTRAVENOUS
  Administered 2014-05-13: 2 mg via INTRAVENOUS
  Administered 2014-05-13: 1 mg via INTRAVENOUS
  Administered 2014-05-14 – 2014-05-15 (×7): 2 mg via INTRAVENOUS
  Filled 2014-05-11 (×14): qty 1

## 2014-05-11 MED ORDER — MAGNESIUM SULFATE 2 GM/50ML IV SOLN
2.0000 g | Freq: Once | INTRAVENOUS | Status: AC
  Administered 2014-05-11: 2 g via INTRAVENOUS
  Filled 2014-05-11: qty 50

## 2014-05-11 NOTE — Progress Notes (Signed)
Order for restraints expired. Disscussed with MD. Restraints removed.  Patient intermittently restless and agitated, but manageable. PRN medications ordered for agitation. Bed Alarm on. Frequent Rounding.   Roxan Hockey, RN

## 2014-05-11 NOTE — Progress Notes (Signed)
NEURO HOSPITALIST PROGRESS NOTE   SUBJECTIVE:                                                                                                                        Neurologically unchanged. EEG 3/10: " technically difficult tracing secondary to the predominance of muscle and movement artifact. There does appear to be continued left hemispheric slowing and triphasic waves as seen on the previous electroencephalogram of 05/08/2014". On IV keppra 1000 mg BID. Thiamine. OBJECTIVE:                                                                                                                           Vital signs in last 24 hours: Temp:  [94.5 F (34.7 C)-97.4 F (36.3 C)] 97.4 F (36.3 C) (03/11 0600) Pulse Rate:  [56-71] 71 (03/11 0700) Resp:  [11-41] 30 (03/11 0700) BP: (87-174)/(60-117) 100/61 mmHg (03/11 0700) SpO2:  [99 %-100 %] 100 % (03/11 0700)  Intake/Output from previous day: 03/10 0701 - 03/11 0700 In: 2532.9 [I.V.:1932.9; IV Piggyback:600] Out: 1400 [Urine:1400] Intake/Output this shift:   Nutritional status: Diet NPO time specified  Past Medical History  Diagnosis Date  . Hypertension   . Seizures   . ETOH abuse   . Drug abuse   . PTSD (post-traumatic stress disorder)   . H/O prolonged Q-T interval on ECG   . Polysubstance abuse   . Alcohol abuse   . Depression   Physical exam: pleasant male in no apparent distress. Head: normocephalic. Neck: supple, no bruits, no JVD. Cardiac: no murmurs. Lungs: clear. Abdomen: soft, no tender, no mass. Extremities: no edema. Skin: no rash   Neurologic Exam:  General: Mental Status: PATIENT ON PRECEDEX sedated, perseverates, conversations make no sense,  follows simple commands but inconsistently.  Cranial Nerves: II: Discs were not assesed; Visual fields grossly normal, pupils equal, round, reactive to light and accommodation III,IV, VI: ptosis not present, extra-ocular motions  intact bilaterally V,VII: smile symmetric, facial light touch sensation normal bilaterally VIII: hearing normal bilaterally IX,X: uvula rises symmetrically XI: bilateral shoulder shrug XII: midline tongue extension without atrophy or fasciculations Motor: Moves all limbs symmetrically Sensory: Pinprick and light touch intact unreliable due to confusion Deep Tendon Reflexes:  2 in  the upper extremities, 1+ patella bilaterally.  Plantars: Right: downgoingLeft: downgoing Cerebellar: Unable to test as patient is not following commands consistently. Gait:  No tested due to safety reasons.  Lab Results: Lab Results  Component Value Date/Time   CHOL 150 05/09/2014 03:38 AM   Lipid Panel  Recent Labs  05/09/14 0338  CHOL 150  TRIG 94  HDL 68  CHOLHDL 2.2  VLDL 19  LDLCALC 63    Studies/Results: No results found.  MEDICATIONS                                                                                                                        Scheduled: . cloNIDine  0.1 mg Transdermal Weekly  . folic acid  1 mg Intravenous Daily  . levETIRAcetam  1,000 mg Intravenous Q12H  . LORazepam  1 mg Intravenous Q6H  . metoprolol  5 mg Intravenous 4 times per day  . thiamine  100 mg Intravenous Daily    ASSESSMENT/PLAN:                                                                                                            69 y.o. Male with alcohol abuse brought in with confusion, right sided weakness (resolved), witnessed myoclonic jerking of his facial muscles on the right, right arm and right leg in the ambulance that lasted over 5 minutes, trouble speaking. CT brain unremarkable for acute abnormality. MRI brain without acute abnormality.  Repeat EEG with left hemispheric slowing and triphasic waves His mental status is not really improved ? Wernicke's encephalopathy/korsakoff syndrome.  Continue keppra 1 gr BID.  Will follow  up.  Dorian Pod, MD [facility] [phone]  05/11/2014, 9:07 AM

## 2014-05-11 NOTE — Progress Notes (Signed)
PULMONARY / CRITICAL CARE MEDICINE   Name: Miguel Hanson MRN: [id number] DOB: April 29, 1945    ADMISSION DATE:  05/08/2014 CONSULTATION DATE:  05/11/2014  REFERRING MD :  Thereasa Solo  CHIEF COMPLAINT:  ETOH withdrawal  INITIAL PRESENTATION:  69 y.o. M brought to [facility] ED 3/8 for confusion, garbled speech, and focal seizure like activity. He was admitted for AMS, seizure disorder, ETOH abuse.  On 3/9, he remained agitated despite high doses of ativan.  He was transferred to ICU and started on precedex.   STUDIES:  CT head 3/8 >>> Small vessel disease, global atrophy without hydrocephalus, air fluid level ? Sinusitis.  MRI / MRA brain 3/8 >>> No acute infarct or mass.  Moderate atrophy, mild to mod chronic microvascular ischemia.  Mild stenosis right MCA branch.  SIGNIFICANT EVENTS: 3/8 - admit 3/9 - transferred to ICU, started on precedex  SUBJECTIVE: calm on precedex gtt,does not answer questions, RASS+1   VITAL SIGNS: Temp:  [94.5 F (34.7 C)-97.4 F (36.3 C)] 97.4 F (36.3 C) (03/11 0600) Pulse Rate:  [56-71] 71 (03/11 0700) Resp:  [11-41] 30 (03/11 0700) BP: (87-174)/(60-117) 100/61 mmHg (03/11 0700) SpO2:  [99 %-100 %] 100 % (03/11 0700)      INTAKE / OUTPUT: Intake/Output      03/10 0701 - 03/11 0700 03/11 0701 - 03/12 0700   P.O.     I.V. (mL/kg) 1932.9 (38.7)    IV Piggyback 600    Total Intake(mL/kg) 2532.9 (50.7)    Urine (mL/kg/hr) 1400 (1.2)    Stool     Total Output 1400     Net +1132.9          Urine Occurrence 3 x      PHYSICAL EXAMINATION: General: Thin, elderly appearing AA male, calm, int agitation Neuro: does not answers questions. RASS +1. HEENT: [place]/AT. PERRL, sclerae anicteric. Cardiovascular: RRR, no M/R/G.  Lungs: Air entry equal bilaterally, scattered wheeze. No rales or rhonchi.  Abdomen: BS x 4, soft, NT/ND.  Musculoskeletal: No gross deformities, no edema.  Skin: Intact, warm, no rashes.  LABS:  CBC  Recent Labs Lab 05/08/14 0845  05/08/14 0855 05/10/14 0535 05/11/14 0251  WBC 7.4  --  6.3 11.0*  HGB 11.2* 12.2* 10.2* 11.0*  HCT 31.0* 36.0* 27.9* 30.5*  PLT 99*  --  87* 102*   Coag's  Recent Labs Lab 05/08/14 0845  APTT 34  INR 1.10   BMET  Recent Labs Lab 05/08/14 0845 05/08/14 0855 05/10/14 0900 05/11/14 0251  NA 136 136 139 139  K 4.0 4.0 3.2* 3.7  CL 100 99 108 108  CO2 26  --  25 21  BUN 10 9 <5* <5*  CREATININE 0.64 0.60 0.47* 0.57  GLUCOSE 95 96 101* 83   Electrolytes  Recent Labs Lab 05/08/14 0845 05/08/14 1709 05/10/14 0900 05/11/14 0251  CALCIUM 9.0  --  8.2* 8.2*  MG  --  1.3* 1.6 1.7   Liver Enzymes  Recent Labs Lab 05/08/14 0845 05/10/14 0900  AST 105* 45*  ALT 34 26  ALKPHOS 89 67  BILITOT 0.7 1.0  ALBUMIN 3.6 2.9*   Glucose  Recent Labs Lab 05/08/14 0922  GLUCAP 118*    Imaging No results found.   EKG 3/9 >>> ST, QTc 518.  ASSESSMENT / PLAN:  NEUROLOGIC A:   ETOH withdrawawl Wernicke's encephalopathy / Korsakoff Syndrome Seizure disorder with EEG  -left hemispheric slowing and triphasic waves-raising concern for ? ongoing subclinical seizures [name] -  right weakness -resolved ETOH and drug abuse (UDS positive for marijuana and benzo's - did receive Versed with EMS) Hx PTSD, depression P:   Continue Precedex gtt -titrate to RASS 0 No antipsychotics given prolonged QTc Thiamine/Folate IV Continue Keppra Neurology following Substance abuse counseling  CARDIOVASCULAR A:  HTN Prolonged QTc  Chronic diastolic heart failure (ECHO from 05/08/14 with EF 55-60%, grade 1 DD) P:  Lopressor 5 mg IV q6h Hydralazine PRN. Avoid Haldol and other QT prolonging drugs  PULMONARY A: No acute issues, protecting airway P:   Monitor for deterioration  RENAL A:   Hypomagnesemia P:   NS @ 75 cc/hr BMP pending  GASTROINTESTINAL A:   Elevated AST c/w ETOH abuse GI prophylaxis Protein calorie mal nutrition P:    Protonix NPO  HEMATOLOGIC A:   Macrocytic anemia 2/2 alcohol abuse Thrombocytopenia 2/2 alcohol abuse -improving VTE Prophylaxis P:  SCD's CBC in AM  INFECTIOUS A:   No acute issues P:   Monitor clinically  ENDOCRINE A:   No acute issues P:   No interventions required   Family updated: None.  Interdisciplinary Family Meeting v Palliative Care Meeting:  Due by: 3/15.   Sumamry - Try to taper precedex but suspect will need this  Use ativan prn  Care during the described time interval was provided by me and/or other providers on the critical care team.  I have reviewed this patient's available data, including medical history, events of note, physical examination and test results as part of my evaluation  CC time x 1m  Shelba Susi V. MD  05/11/2014 9:39 AM

## 2014-05-12 LAB — BASIC METABOLIC PANEL
Anion gap: 7 (ref 5–15)
BUN: <5 mg/dL — ABNORMAL LOW (ref 6–23)
CHLORIDE: 113 mmol/L — AB (ref 96–112)
CO2: 18 mmol/L — ABNORMAL LOW (ref 19–32)
Calcium: 8.1 mg/dL — ABNORMAL LOW (ref 8.4–10.5)
Creatinine, Ser: 0.5 mg/dL (ref 0.50–1.35)
GFR calc Af Amer: >90 mL/min (ref >90)
GFR calc non Af Amer: >90 mL/min (ref >90)
Glucose, Bld: 89 mg/dL (ref 70–99)
Potassium: 3.4 mmol/L — ABNORMAL LOW (ref 3.5–5.1)
Sodium: 138 mmol/L (ref 135–145)

## 2014-05-12 LAB — CBC
HCT: 29 % — ABNORMAL LOW (ref 39.0–52.0)
HEMOGLOBIN: 10.6 g/dL — AB (ref 13.0–17.0)
MCH: 38.3 pg — AB (ref 26.0–34.0)
MCHC: 36.6 g/dL — ABNORMAL HIGH (ref 30.0–36.0)
MCV: 104.7 fL — AB (ref 78.0–100.0)
Platelets: 102 10*3/uL — ABNORMAL LOW (ref 150–400)
RBC: 2.77 MIL/uL — ABNORMAL LOW (ref 4.22–5.81)
RDW: 19.2 % — AB (ref 11.5–15.5)
WBC: 8.3 10*3/uL (ref 4.0–10.5)

## 2014-05-12 MED ORDER — DEXMEDETOMIDINE HCL IN NACL 200 MCG/50ML IV SOLN
0.2000 ug/kg/h | INTRAVENOUS | Status: DC
  Administered 2014-05-12: 0.5 ug/kg/h via INTRAVENOUS
  Filled 2014-05-12: qty 50

## 2014-05-12 MED ORDER — SODIUM CHLORIDE 0.9 % IV SOLN
INTRAVENOUS | Status: DC
  Administered 2014-05-11: 19:00:00 via INTRAVENOUS
  Administered 2014-05-13: 250 mL via INTRAVENOUS
  Administered 2014-05-14: 500 mL via INTRAVENOUS

## 2014-05-12 MED ORDER — DEXMEDETOMIDINE HCL IN NACL 200 MCG/50ML IV SOLN: 0.2000 ug/kg/h | INTRAVENOUS | Status: DC

## 2014-05-12 MED ORDER — METOPROLOL TARTRATE 1 MG/ML IV SOLN
5.0000 mg | Freq: Four times a day (QID) | INTRAVENOUS | Status: DC | PRN
  Administered 2014-05-13 – 2014-05-18 (×4): 5 mg via INTRAVENOUS
  Filled 2014-05-12 (×4): qty 5

## 2014-05-12 MED ORDER — POTASSIUM CHLORIDE 10 MEQ/100ML IV SOLN
10.0000 meq | INTRAVENOUS | Status: AC
  Administered 2014-05-12 (×6): 10 meq via INTRAVENOUS
  Filled 2014-05-12: qty 100

## 2014-05-12 NOTE — Progress Notes (Signed)
[facility] Progress Note Patient Name: Boone Gear DOB: 01-30-1946 MRN: [id number]   Date of Service  05/12/2014  HPI/Events of Note  Hypokalemia  eICU Interventions  Potassium replaced     Intervention Category Intermediate Interventions: Electrolyte abnormality - evaluation and management  DETERDING,ELIZABETH 05/12/2014, 5:42 AM

## 2014-05-12 NOTE — Progress Notes (Signed)
PULMONARY / CRITICAL CARE MEDICINE   Name: Miguel Hanson MRN: [id number] DOB: August 23, 1945    ADMISSION DATE:  05/08/2014 CONSULTATION DATE:  05/12/2014  REFERRING MD :  Thereasa Solo  CHIEF COMPLAINT:  ETOH withdrawal  INITIAL PRESENTATION:  69 y.o. M brought to [facility] ED 3/8 for confusion, garbled speech, and focal seizure like activity. He was admitted for AMS, seizure disorder, ETOH abuse.  On 3/9, he remained agitated despite high doses of ativan.  He was transferred to ICU and started on precedex.   STUDIES:  CT head 3/8 >>> Small vessel disease, global atrophy without hydrocephalus, air fluid level ? Sinusitis.  MRI / MRA brain 3/8 >>> No acute infarct or mass.  Moderate atrophy, mild to mod chronic microvascular ischemia.  Mild stenosis right MCA branch.  SIGNIFICANT EVENTS: 3/8 - admit 3/9 - transferred to ICU, started on precedex  SUBJECTIVE: calm on lower dose precedex gtt,int agitation, RASS+1  afebrile  VITAL SIGNS: Temp:  [97.4 F (36.3 C)-97.8 F (36.6 C)] 97.7 F (36.5 C) (03/12 0700) Pulse Rate:  [26-111] 111 (03/11 1600) Resp:  [17-33] 31 (03/12 0700) BP: (100-180)/(47-98) 115/64 mmHg (03/12 0700) SpO2:  [93 %-100 %] 96 % (03/12 0700)      INTAKE / OUTPUT: Intake/Output      03/11 0701 - 03/12 0700 03/12 0701 - 03/13 0700   P.O. 300    I.V. (mL/kg) 2083.1 (41.7)    IV Piggyback 550    Total Intake(mL/kg) 2933.1 (58.7)    Urine (mL/kg/hr) 250 (0.2)    Stool 0 (0)    Total Output 250     Net +2683.1          Urine Occurrence 2 x    Stool Occurrence 1 x      PHYSICAL EXAMINATION: General: Thin, elderly appearing AA male, calm, int agitation Neuro: does not answers questions. RASS +1. HEENT: [place]/AT. PERRL, sclerae anicteric. Cardiovascular: RRR, no M/R/G.  Lungs: Air entry equal bilaterally, scattered wheeze. No rales or rhonchi.  Abdomen: BS x 4, soft, NT/ND.  Musculoskeletal: No gross deformities, no edema.  Skin: Intact, warm, no  rashes.  LABS:  CBC  Recent Labs Lab 05/10/14 0535 05/11/14 0251 05/12/14 0320  WBC 6.3 11.0* 8.3  HGB 10.2* 11.0* 10.6*  HCT 27.9* 30.5* 29.0*  PLT 87* 102* 102*   Coag's  Recent Labs Lab 05/08/14 0845  APTT 34  INR 1.10   BMET  Recent Labs Lab 05/10/14 0900 05/11/14 0251 05/12/14 0320  NA 139 139 138  K 3.2* 3.7 3.4*  CL 108 108 113*  CO2 25 21 18*  BUN <5* <5* <5*  CREATININE 0.47* 0.57 0.50  GLUCOSE 101* 83 89   Electrolytes  Recent Labs Lab 05/08/14 1709 05/10/14 0900 05/11/14 0251 05/12/14 0320  CALCIUM  --  8.2* 8.2* 8.1*  MG 1.3* 1.6 1.7  --    Liver Enzymes  Recent Labs Lab 05/08/14 0845 05/10/14 0900  AST 105* 45*  ALT 34 26  ALKPHOS 89 67  BILITOT 0.7 1.0  ALBUMIN 3.6 2.9*   Glucose  Recent Labs Lab 05/08/14 0922  GLUCAP 118*    Imaging No results found.   EKG 3/9 >>> ST, QTc 518.  ASSESSMENT / PLAN:  NEUROLOGIC A:   ETOH withdrawawl Wernicke's encephalopathy / Korsakoff Syndrome Seizure disorder with EEG  -left hemispheric slowing and triphasic waves-raising concern for ? ongoing subclinical seizures [name] - right weakness -resolved ETOH and drug abuse (UDS positive for marijuana and  benzo's - did receive Versed with EMS) Hx PTSD, depression P:   Taper Precedex gtt -titrate to RASS 0 No antipsychotics given prolonged QTc Thiamine/Folate IV Continue Keppra Neurology following Substance abuse counseling  CARDIOVASCULAR A:  HTN Prolonged QTc  Chronic diastolic heart failure (ECHO from 05/08/14 with EF 55-60%, grade 1 DD) P:  Lopressor 5 mg IV q6h Hydralazine PRN. Avoid Haldol and other QT prolonging drugs  PULMONARY A: No acute issues, protecting airway P:   Monitor for deterioration  RENAL A:   Hypomagnesemia hypokalemia P:   Dc IVFs once PO intake improved BMP daily  GASTROINTESTINAL A:   Elevated AST c/w ETOH abuse GI prophylaxis Protein calorie mal nutrition P:    Protonix Advance PO  HEMATOLOGIC A:   Macrocytic anemia 2/2 alcohol abuse Thrombocytopenia 2/2 alcohol abuse -improving VTE Prophylaxis P:  SCD's CBC in AM  INFECTIOUS A:   No acute issues P:   Monitor clinically  ENDOCRINE A:   No acute issues P:   No interventions required   Family updated: None.  Interdisciplinary Family Meeting v Palliative Care Meeting:  Due by: 3/15.   Sumamry - Try to taper precedex , Use ativan prn  Care during the described time interval was provided by me and/or other providers on the critical care team.  I have reviewed this patient's available data, including medical history, events of note, physical examination and test results as part of my evaluation  CC time x 69m  ALVA,RAKESH V. MD  05/12/2014 8:28 AM

## 2014-05-13 DIAGNOSIS — I4581 Long QT syndrome: Secondary | ICD-10-CM

## 2014-05-13 LAB — BASIC METABOLIC PANEL
ANION GAP: 5 (ref 5–15)
BUN: <5 mg/dL — ABNORMAL LOW (ref 6–23)
CALCIUM: 8.4 mg/dL (ref 8.4–10.5)
CO2: 21 mmol/L (ref 19–32)
Chloride: 109 mmol/L (ref 96–112)
Creatinine, Ser: 0.49 mg/dL — ABNORMAL LOW (ref 0.50–1.35)
GFR calc Af Amer: >90 mL/min (ref >90)
Glucose, Bld: 92 mg/dL (ref 70–99)
POTASSIUM: 3.2 mmol/L — AB (ref 3.5–5.1)
Sodium: 135 mmol/L (ref 135–145)

## 2014-05-13 LAB — PHOSPHORUS: Phosphorus: 2.7 mg/dL (ref 2.3–4.6)

## 2014-05-13 LAB — MAGNESIUM
MAGNESIUM: 1.3 mg/dL — AB (ref 1.5–2.5)
MAGNESIUM: 2.2 mg/dL (ref 1.5–2.5)

## 2014-05-13 MED ORDER — HALOPERIDOL LACTATE 5 MG/ML IJ SOLN: 1.0000 mg | INTRAMUSCULAR | Status: DC | PRN

## 2014-05-13 MED ORDER — LEVETIRACETAM IN NACL 500 MG/100ML IV SOLN
500.0000 mg | Freq: Two times a day (BID) | INTRAVENOUS | Status: DC
  Administered 2014-05-13 – 2014-05-17 (×7): 500 mg via INTRAVENOUS
  Filled 2014-05-13 (×9): qty 100

## 2014-05-13 MED ORDER — SODIUM CHLORIDE 0.9 % IV SOLN
6.0000 g | Freq: Once | INTRAVENOUS | Status: AC
  Administered 2014-05-13: 6 g via INTRAVENOUS
  Filled 2014-05-13: qty 12

## 2014-05-13 MED ORDER — DEXMEDETOMIDINE HCL IN NACL 200 MCG/50ML IV SOLN
0.4000 ug/kg/h | INTRAVENOUS | Status: DC
  Administered 2014-05-13: 0.8 ug/kg/h via INTRAVENOUS
  Administered 2014-05-13: 1.1 ug/kg/h via INTRAVENOUS
  Administered 2014-05-13: 0.8 ug/kg/h via INTRAVENOUS
  Administered 2014-05-14: 0.2 ug/kg/h via INTRAVENOUS
  Administered 2014-05-14: 0.6 ug/kg/h via INTRAVENOUS
  Administered 2014-05-14 (×2): 1.1 ug/kg/h via INTRAVENOUS
  Administered 2014-05-15 (×3): 1.2 ug/kg/h via INTRAVENOUS
  Administered 2014-05-15: 1 ug/kg/h via INTRAVENOUS
  Filled 2014-05-13 (×12): qty 50

## 2014-05-13 MED ORDER — LEVETIRACETAM 500 MG PO TABS
500.0000 mg | ORAL_TABLET | Freq: Two times a day (BID) | ORAL | Status: DC
Start: 2014-05-13 — End: 2014-05-13
  Administered 2014-05-13: 500 mg via ORAL
  Filled 2014-05-13 (×2): qty 1

## 2014-05-13 MED ORDER — POTASSIUM CHLORIDE 10 MEQ/100ML IV SOLN
INTRAVENOUS | Status: AC
Start: 2014-05-13 — End: 2014-05-13
  Filled 2014-05-13: qty 400

## 2014-05-13 MED ORDER — POTASSIUM CHLORIDE 10 MEQ/100ML IV SOLN
10.0000 meq | INTRAVENOUS | Status: AC
  Administered 2014-05-13 (×4): 10 meq via INTRAVENOUS

## 2014-05-13 MED ORDER — QUETIAPINE FUMARATE 50 MG PO TABS
50.0000 mg | ORAL_TABLET | Freq: Two times a day (BID) | ORAL | Status: DC
Start: 2014-05-13 — End: 2014-05-15
  Administered 2014-05-14 – 2014-05-15 (×2): 50 mg via ORAL
  Filled 2014-05-13 (×6): qty 1

## 2014-05-13 MED ORDER — LORAZEPAM 2 MG/ML IJ SOLN
2.0000 mg | Freq: Once | INTRAMUSCULAR | Status: AC
  Filled 2014-05-13: qty 1

## 2014-05-13 NOTE — Progress Notes (Signed)
Patient has been agitated all day. Placed in chair hoping this would help him relax. Keeps trying to get up, pull lines and foley. Unable to reason with patient. Dr. Elsworth Soho notified and orders received. Hitting at nurses and cursing. Ativan has been administered without success. Precedex started without success. Placed in bed and order received for restraints.

## 2014-05-13 NOTE — Progress Notes (Signed)
eLink Physician-Brief Progress Note Patient Name: Miguel Hanson DOB: 04-13-1945 MRN: [id number]   Date of Service  05/13/2014  HPI/Events of Note  Called to change Keppra PO to Keppra IV. Patient is somewhat lethargic and not cooperative to take PO meds. Patient is also on Seroquel which has not IV formulation.   eICU Interventions  Will order: 1. D/C Keppra PO. 2. Keppra 500mg  IV Q 12 hours.      Intervention Category Minor Interventions: Routine modifications to care plan (e.g. PRN medications for pain, fever)  Sommer,Steven Eugene 05/13/2014, 10:25 PM

## 2014-05-13 NOTE — Progress Notes (Signed)
PULMONARY / CRITICAL CARE MEDICINE   Name: Miguel Hanson MRN: [id number] DOB: 1945-06-24    ADMISSION DATE:  05/08/2014 CONSULTATION DATE:  05/13/2014  REFERRING MD :  Thereasa Solo  CHIEF COMPLAINT:  ETOH withdrawal  INITIAL PRESENTATION:  69 y.o. M brought to [facility] ED 3/8 for confusion, garbled speech, and focal seizure like activity. He was admitted for AMS, seizure disorder, ETOH abuse.  On 3/9, he remained agitated despite high doses of ativan.  He was transferred to ICU and started on precedex.   STUDIES:  CT head 3/8 >>> Small vessel disease, global atrophy without hydrocephalus, air fluid level ? Sinusitis.  MRI / MRA brain 3/8 >>> No acute infarct or mass.  Moderate atrophy, mild to mod chronic microvascular ischemia.  Mild stenosis right MCA branch.  SIGNIFICANT EVENTS: 3/8 - admit 3/9 - transferred to ICU, started on precedex  SUBJECTIVE:off  precedex gtt,int agitation, RASS+1  afebrile  VITAL SIGNS: Temp:  [97.4 F (36.3 C)-99 F (37.2 C)] 98.7 F (37.1 C) (03/13 0752) Resp:  [15-27] 21 (03/13 0700) BP: (123-197)/(77-175) 167/102 mmHg (03/13 0700) SpO2:  [94 %-98 %] 98 % (03/13 0752)      INTAKE / OUTPUT: Intake/Output      03/12 0701 - 03/13 0700 03/13 0701 - 03/14 0700   P.O. 900    I.V. (mL/kg) 1126.7 (22.5)    IV Piggyback 800    Total Intake(mL/kg) 2826.7 (56.5)    Urine (mL/kg/hr) 1450 (1.2)    Stool     Total Output 1450     Net +1376.7            PHYSICAL EXAMINATION: General: Thin, elderly appearing AA male, calm, int agitation Neuro:can tell me his name, confused RASS +1. HEENT: [place]/AT. PERRL, sclerae anicteric. Cardiovascular: RRR, no M/R/G.  Lungs: Air entry equal bilaterally, scattered wheeze. No rales or rhonchi.  Abdomen: BS x 4, soft, NT/ND.  Musculoskeletal: No gross deformities, no edema.  Skin: Intact, warm, no rashes.  LABS:  CBC  Recent Labs Lab 05/10/14 0535 05/11/14 0251 05/12/14 0320  WBC 6.3 11.0* 8.3  HGB 10.2* 11.0*  10.6*  HCT 27.9* 30.5* 29.0*  PLT 87* 102* 102*   Coag's  Recent Labs Lab 05/08/14 0845  APTT 34  INR 1.10   BMET  Recent Labs Lab 05/11/14 0251 05/12/14 0320 05/13/14 0345  NA 139 138 135  K 3.7 3.4* 3.2*  CL 108 113* 109  CO2 21 18* 21  BUN <5* <5* <5*  CREATININE 0.57 0.50 0.49*  GLUCOSE 83 89 92   Electrolytes  Recent Labs Lab 05/10/14 0900 05/11/14 0251 05/12/14 0320 05/13/14 0345  CALCIUM 8.2* 8.2* 8.1* 8.4  MG 1.6 1.7  --  1.3*  PHOS  --   --   --  2.7   Liver Enzymes  Recent Labs Lab 05/08/14 0845 05/10/14 0900  AST 105* 45*  ALT 34 26  ALKPHOS 89 67  BILITOT 0.7 1.0  ALBUMIN 3.6 2.9*   Glucose  Recent Labs Lab 05/08/14 0922  GLUCAP 118*    Imaging No results found.   EKG 3/9 >>> ST, QTc 518.  ASSESSMENT / PLAN:  NEUROLOGIC A:   ETOH withdrawawl Wernicke's encephalopathy / Korsakoff Syndrome Seizure disorder with EEG  -left hemispheric slowing and triphasic waves-raising concern for ? ongoing subclinical seizures [name] - right weakness -resolved ETOH and drug abuse (UDS positive for marijuana and benzo's - did receive Versed with EMS) Hx PTSD, depression P:   Precedex  off Haldol prn -monitor QTc Thiamine/Folate IV Continue Keppra PO Neurology following Substance abuse counseling  CARDIOVASCULAR A:  HTN Prolonged QTc  Chronic diastolic heart failure (ECHO from 05/08/14 with EF 55-60%, grade 1 DD) P:  Lopressor 5 mg IV q6h Hydralazine PRN. Avoid Haldol and other QT prolonging drugs  PULMONARY A: No acute issues, protecting airway P:   Monitor for deterioration  RENAL A:   Hypomagnesemia hypokalemia P:   Dc IVFs now that PO intake improved BMP daily Replete K & Mg  GASTROINTESTINAL A:   Elevated AST c/w ETOH abuse GI prophylaxis Protein calorie mal nutrition P:   Protonix Advance PO  HEMATOLOGIC A:   Macrocytic anemia 2/2 alcohol abuse Thrombocytopenia 2/2 alcohol abuse -improving VTE  Prophylaxis P:  SCD's CBC in AM  INFECTIOUS A:   No acute issues P:   Monitor clinically  ENDOCRINE A:   No acute issues P:   No interventions required   Family updated: None.  Interdisciplinary Family Meeting v Palliative Care Meeting:  Due by: 3/15.   Sumamry -  Use ativan & haldol prn, transfer to SDU, ETOH withdrawal seems to have resolved, encephalopathy persists  Care during the described time interval was provided by me and/or other providers on the critical care team.  I have reviewed this patient's available data, including medical history, events of note, physical examination and test results as part of my evaluation  CC time x 60m  Larkin Alfred V. MD  05/13/2014 8:13 AM

## 2014-05-13 NOTE — Progress Notes (Addendum)
NEURO HOSPITALIST PROGRESS NOTE   SUBJECTIVE:                                                                                                                        No new neurological developments. Restless Talking about need to have a cat but doesn't like dogs. When asked, he said he is doing a lot better but can not sustain a simple conversation.  OBJECTIVE:                                                                                                                           Vital signs in last 24 hours: Temp:  [97.4 F (36.3 C)-99 F (37.2 C)] 98.7 F (37.1 C) (03/13 0752) Resp:  [15-27] 21 (03/13 0700) BP: (123-197)/(81-175) 167/102 mmHg (03/13 0700) SpO2:  [94 %-98 %] 98 % (03/13 0752)  Intake/Output from previous day: 03/12 0701 - 03/13 0700 In: 2826.7 [P.O.:900; I.V.:1126.7; IV Piggyback:800] Out: 1450 [Urine:1450] Intake/Output this shift: Total I/O In: 200 [IV Piggyback:200] Out: -  Nutritional status: DIET SOFT  Past Medical History  Diagnosis Date  . Hypertension   . Seizures   . ETOH abuse   . Drug abuse   . PTSD (post-traumatic stress disorder)   . H/O prolonged Q-T interval on ECG   . Polysubstance abuse   . Alcohol abuse   . Depression    Physical exam: pleasant male in no apparent distress. Head: normocephalic. Neck: supple, no bruits, no JVD. Cardiac: no murmurs. Lungs: clear. Abdomen: soft, no tender, no mass. Extremities: no edema. Skin: no rash  Neurologic Exam:  Mental Status; Alert and awake, sitting in a chair at the bedisde perseverates and gets angry, conversations make no sense, follows simple commands but inconsistently.  Cranial Nerves: II: Discs were not assesed; Visual fields grossly normal, pupils equal, round, reactive to light and accommodation III,IV, VI: ptosis not present, extra-ocular motions intact bilaterally V,VII: smile symmetric, facial light touch sensation normal  bilaterally VIII: hearing normal bilaterally IX,X: uvula rises symmetrically XI: bilateral shoulder shrug XII: midline tongue extension without atrophy or fasciculations Motor: Moves all limbs symmetrically Sensory: Pinprick and light touch intact unreliable due to confusion Deep Tendon Reflexes:  2 in the upper extremities, 1+  patella bilaterally.  Plantars: Right: downgoingLeft: downgoing Cerebellar: Unable to test as patient is not following commands consistently. Gait:  No tested due to safety reasons.  Lab Results: Lab Results  Component Value Date/Time   CHOL 150 05/09/2014 03:38 AM   Lipid Panel No results for input(s): CHOL, TRIG, HDL, CHOLHDL, VLDL, LDLCALC in the last 72 hours.  Studies/Results: No results found.  MEDICATIONS                                                                                                                        Scheduled: . cloNIDine  0.1 mg Transdermal Weekly  . folic acid  1 mg Intravenous Daily  . levETIRAcetam  500 mg Oral BID  . LORazepam  1-2 mg Intravenous Q4H  . magnesium sulfate LVP 250-500 ml  6 g Intravenous Once  . potassium chloride      . thiamine  100 mg Intravenous Daily    ASSESSMENT/PLAN:                                                                                                           69 y.o. male with alcohol abuse brought in with confusion, right sided weakness (resolved), witnessed myoclonic jerking of his facial muscles on the right, right arm and right leg in the ambulance that lasted over 5 minutes, trouble speaking. CT brain unremarkable for acute abnormality. MRI brain without acute abnormality.  Repeat EEG with left hemispheric slowing and triphasic waves His mental status is not really improved ? Wernicke's encephalopathy/korsakoff syndrome with superimposed agitated delirium. Continue keppra to 500 mg BID.  Dorian Pod, MD [facility] [phone]  05/13/2014, 10:48 AM

## 2014-05-13 NOTE — Progress Notes (Signed)
[facility] ADULT ICU REPLACEMENT PROTOCOL FOR AM LAB REPLACEMENT ONLY  The patient does apply for the [facility] Adult ICU Electrolyte Replacment Protocol based on the criteria listed below:   1. Is GFR >/= 40 ml/min? Yes.    Patient's GFR today is >90 2. Is urine output >/= 0.5 ml/kg/hr for the last 6 hours? Yes.   Patient's UOP is 3.4 ml/kg/hr 3. Is BUN < 60 mg/dL? Yes.    Patient's BUN today is <5 4. Abnormal electrolyte(s): K 3.2, Mg 1.3 5. Ordered repletion with: per protocol 6. If a panic level lab has been reported, has the CCM MD in charge been notified? No..   Physician:    Ronda Fairly A 05/13/2014 6:23 AM

## 2014-05-14 LAB — BASIC METABOLIC PANEL
Anion gap: 8 (ref 5–15)
BUN: <5 mg/dL — ABNORMAL LOW (ref 6–23)
CALCIUM: 9.1 mg/dL (ref 8.4–10.5)
CO2: 22 mmol/L (ref 19–32)
Chloride: 110 mmol/L (ref 96–112)
Creatinine, Ser: 0.47 mg/dL — ABNORMAL LOW (ref 0.50–1.35)
Glucose, Bld: 100 mg/dL — ABNORMAL HIGH (ref 70–99)
Potassium: 4.4 mmol/L (ref 3.5–5.1)
SODIUM: 140 mmol/L (ref 135–145)

## 2014-05-14 LAB — MAGNESIUM: Magnesium: 2 mg/dL (ref 1.5–2.5)

## 2014-05-14 MED ORDER — CLONIDINE HCL 0.2 MG/24HR TD PTWK
0.2000 mg | MEDICATED_PATCH | TRANSDERMAL | Status: DC
  Administered 2014-05-14: 0.2 mg via TRANSDERMAL
  Filled 2014-05-14: qty 1

## 2014-05-14 NOTE — Progress Notes (Signed)
NEURO HOSPITALIST PROGRESS NOTE   SUBJECTIVE:                                                                                                                        No new neurological developments. Restless Agitated upon waking.   OBJECTIVE:                                                                                                                           Vital signs in last 24 hours: Temp:  [97 F (36.1 C)-98.7 F (37.1 C)] 98.7 F (37.1 C) (03/14 0400) Pulse Rate:  [48-85] 61 (03/14 0700) Resp:  [8-30] 22 (03/14 0700) BP: (117-196)/(88-154) 121/97 mmHg (03/14 0700) SpO2:  [93 %-100 %] 98 % (03/14 0700)  Intake/Output from previous day: 03/13 0701 - 03/14 0700 In: 1059.1 [P.O.:100; I.V.:759.1; IV Piggyback:200] Out: 3500 [Urine:3500] Intake/Output this shift:   Nutritional status: DIET SOFT  Past Medical History  Diagnosis Date  . Hypertension   . Seizures   . ETOH abuse   . Drug abuse   . PTSD (post-traumatic stress disorder)   . H/O prolonged Q-T interval on ECG   . Polysubstance abuse   . Alcohol abuse   . Depression    Physical exam: pleasant male in no apparent distress. Head: normocephalic. Neck: supple, no bruits, no JVD. Cardiac: no murmurs. Lungs: clear. Abdomen: soft, no tender, no mass. Extremities: no edema. Skin: no rash  Neurologic Exam:  Mental Status; Sleeping but easily aroused with voice. Oriented to name only. Perseverates and gets angry, conversations make no sense, follows simple commands but inconsistently.  Cranial Nerves: II: Visual fields grossly normal, pupils equal, round, reactive to light III,IV, VI: ptosis not present, extra-ocular motions intact bilaterally V,VII: smile symmetric, facial light touch sensation normal bilaterally Motor: Moves all limbs symmetrically Sensory:  light touch intact unreliable due to confusion  Lab Results: Lab Results  Component Value Date/Time   CHOL 150 05/09/2014 03:38 AM   Lipid Panel No results for input(s): CHOL, TRIG, HDL, CHOLHDL, VLDL, LDLCALC in the last 72 hours.  Studies/Results: No results found.  MEDICATIONS  Scheduled: . cloNIDine  0.1 mg Transdermal Weekly  . folic acid  1 mg Intravenous Daily  . levETIRAcetam  500 mg Intravenous Q12H  . LORazepam  1-2 mg Intravenous Q4H  . QUEtiapine  50 mg Oral BID  . thiamine  100 mg Intravenous Daily    ASSESSMENT/PLAN:                                                                                                           69 y.o. male with alcohol abuse brought in with confusion, right sided weakness (resolved), witnessed myoclonic jerking of his facial muscles on the right, right arm and right leg in the ambulance that lasted over 5 minutes, trouble speaking. CT brain unremarkable for acute abnormality.  Suspect continued AMS likely related to Wernicke's encephalopathy/Korsakoff syndrome. Continue IV thiamine and folic acid. Continue Keppra 500mg  BID. Will continue to follow as needed at this time.    Jim Like, DO [facility] [phone]  If 7pm- 7am, please page neurology on call as listed in [place].   05/14/2014, 8:38 AM

## 2014-05-14 NOTE — Progress Notes (Signed)
PULMONARY / CRITICAL CARE MEDICINE   Name: Miguel Hanson MRN: [id number] DOB: 1946/02/23    ADMISSION DATE:  05/08/2014 CONSULTATION DATE:  05/14/2014  REFERRING MD :  Thereasa Solo  CHIEF COMPLAINT:  ETOH withdrawal  INITIAL PRESENTATION:  69 y.o. M brought to [facility] ED 3/8 for confusion, garbled speech, and focal seizure like activity. He was admitted for AMS, seizure disorder, ETOH abuse.  On 3/9, he remained agitated despite high doses of ativan.  He was transferred to ICU and started on precedex.   STUDIES:  CT head 3/8 >>> Small vessel disease, global atrophy without hydrocephalus, air fluid level ? Sinusitis.  MRI / MRA brain 3/8 >>> No acute infarct or mass.  Moderate atrophy, mild to mod chronic microvascular ischemia.  Mild stenosis right MCA branch.  SIGNIFICANT EVENTS: 3/8 - admit 3/9 - transferred to ICU, started on precedex  SUBJECTIVE:off  Agitated   VITAL SIGNS: Temp:  [97 F (36.1 C)-98.7 F (37.1 C)] 98.7 F (37.1 C) (03/14 0400) Pulse Rate:  [48-85] 55 (03/14 0800) Resp:  [8-30] 13 (03/14 0800) BP: (117-196)/(88-154) 166/99 mmHg (03/14 0800) SpO2:  [93 %-100 %] 96 % (03/14 0800)    INTAKE / OUTPUT: Intake/Output      03/13 0701 - 03/14 0700 03/14 0701 - 03/15 0700   P.O. 100    I.V. (mL/kg) 759.1 (15.2) 23.8 (0.5)   IV Piggyback 200    Total Intake(mL/kg) 1059.1 (21.2) 23.8 (0.5)   Urine (mL/kg/hr) 3500 (2.9)    Total Output 3500     Net -2440.9 +23.8        Urine Occurrence 1 x     PHYSICAL EXAMINATION: General: Thin, elderly appearing AA male, calm, int agitation Neuro:can tell me his name, confused RASS +1. HEENT: [place]/AT. PERRL, sclerae anicteric. Cardiovascular: RRR, no M/R/G.  Lungs: Air entry equal bilaterally, scattered wheeze. No rales or rhonchi.  Abdomen: BS x 4, soft, NT/ND.  Musculoskeletal: No gross deformities, no edema.  Skin: Intact, warm, no rashes.  LABS:  CBC  Recent Labs Lab 05/10/14 0535 05/11/14 0251 05/12/14 0320  WBC 6.3  11.0* 8.3  HGB 10.2* 11.0* 10.6*  HCT 27.9* 30.5* 29.0*  PLT 87* 102* 102*   Coag's  Recent Labs Lab 05/08/14 0845  APTT 34  INR 1.10   BMET  Recent Labs Lab 05/12/14 0320 05/13/14 0345 05/14/14 0343  NA 138 135 140  K 3.4* 3.2* 4.4  CL 113* 109 110  CO2 18* 21 22  BUN <5* <5* <5*  CREATININE 0.50 0.49* 0.47*  GLUCOSE 89 92 100*   Electrolytes  Recent Labs Lab 05/12/14 0320 05/13/14 0345 05/13/14 2048 05/14/14 0343  CALCIUM 8.1* 8.4  --  9.1  MG  --  1.3* 2.2 2.0  PHOS  --  2.7  --   --    Liver Enzymes  Recent Labs Lab 05/08/14 0845 05/10/14 0900  AST 105* 45*  ALT 34 26  ALKPHOS 89 67  BILITOT 0.7 1.0  ALBUMIN 3.6 2.9*   Glucose  Recent Labs Lab 05/08/14 0922  GLUCAP 118*    Imaging No results found.   EKG 3/9 >>> ST, QTc 518.  ASSESSMENT / PLAN:  NEUROLOGIC A:   ETOH withdrawawl Wernicke's encephalopathy / Korsakoff Syndrome Seizure disorder with EEG  -left hemispheric slowing and triphasic waves-raising concern for ? ongoing subclinical seizures [name] - right weakness -resolved ETOH and drug abuse (UDS positive for marijuana and benzo's - did receive Versed with EMS) Hx PTSD,  depression P:   Precedex 0.8 and titrating up. Haldol prn -monitor QTc Thiamine/Folate IV Continue Keppra PO Neurology following Substance abuse counseling once agitation is resolved. Will add clonidine patch at 0.1 patch.  CARDIOVASCULAR A:  HTN Prolonged QTc  Chronic diastolic heart failure (ECHO from 05/08/14 with EF 55-60%, grade 1 DD) P:  Lopressor 5 mg IV q6h. Hydralazine PRN. Avoid Haldol and other QT prolonging drugs.  PULMONARY A: No acute issues, protecting airway P:   Monitor for airway protection.  RENAL A:   Hypomagnesemia hypokalemia P:   KVO IVF. BMP daily. Replace electrolytes as indicated.  GASTROINTESTINAL A:   Elevated AST c/w ETOH abuse GI prophylaxis Protein calorie mal nutrition P:    Protonix. Advance PO as able.  HEMATOLOGIC A:   Macrocytic anemia 2/2 alcohol abuse Thrombocytopenia 2/2 alcohol abuse -improving VTE Prophylaxis P:  SCD's. CBC in AM.  INFECTIOUS A:   No acute issues P:   Monitor clinically.  ENDOCRINE A:   No acute issues P:   No interventions required.  Family updated: No family bedside.  Interdisciplinary Family Meeting v Palliative Care Meeting:  Due by: 3/15.  [place] -  Continue precedex, add clonidine patch, push PO, will need physical therapy but will not be able to do while delirious.  Hold in the ICU given extreme agitation and need for a drip.   The patient is critically ill with multiple organ systems failure and requires high complexity decision making for assessment and support, frequent evaluation and titration of therapies, application of advanced monitoring technologies and extensive interpretation of multiple databases.   Critical Care Time devoted to patient care services described in this note is  35  Minutes. This time reflects time of care of this signee Dr Jennet Maduro. This critical care time does not reflect procedure time, or teaching time or supervisory time of PA/NP/Med student/Med Resident etc but could involve care discussion time.  Rush Farmer, [name] [facility] Pulmonary/Critical Care Medicine. Pager: [phone]. After hours pager: [phone].  05/14/2014 9:18 AM

## 2014-05-14 NOTE — Evaluation (Signed)
Physical Therapy Evaluation Patient Details Name: Miguel Hanson MRN: [id number] DOB: 03/09/1945 Today's Date: 05/14/2014   History of Present Illness  Pt adm aphasia and focal seizure-like activity on the right. CT and MRI negative. Pt developed DT's and transferred to ICU and PT was dc'd. Pt now improving and PT reordered. PMH - polysubstance abuse, PTSD, COPD, HTN, seizure.  Clinical Impression  Pt reevaluated. Pt admitted with above diagnosis. Pt currently with functional limitations due to the deficits listed below (see PT Problem List).  Pt will benefit from skilled PT to increase their independence and safety with mobility to allow discharge to the venue listed below.       Follow Up Recommendations SNF    Equipment Recommendations  None recommended by PT    Recommendations for Other Services       Precautions / Restrictions Precautions Precautions: Fall Precaution Comments: confusion      Mobility  Bed Mobility Overal bed mobility: Needs Assistance Bed Mobility: Supine to Sit;Sit to Supine     Supine to sit: +2 for physical assistance;Min assist Sit to supine: +2 for physical assistance;Min assist   General bed mobility comments: Assist to bring trunk up.  Transfers Overall transfer level: Needs assistance Equipment used: 2 person hand held assist Transfers: Sit to/from Stand Sit to Stand: +2 physical assistance;Min assist         General transfer comment: Assist to bring hips up and for balance.  Ambulation/Gait Ambulation/Gait assistance: +2 physical assistance;Min assist Ambulation Distance (Feet): 90 Feet Assistive device: 2 person hand held assist Gait Pattern/deviations: Step-through pattern;Decreased step length - right;Decreased step length - left;Scissoring;Drifts right/left Gait velocity: decr Gait velocity interpretation: Below normal speed for age/gender General Gait Details: Assist for balance.  Stairs            Wheelchair  Mobility    Modified Rankin (Stroke Patients Only)       Balance   Sitting-balance support: No upper extremity supported Sitting balance-Leahy Scale: Fair     Standing balance support: Single extremity supported Standing balance-Leahy Scale: Poor                               Pertinent Vitals/Pain Pain Assessment: Faces Faces Pain Scale: No hurt    Home Living Family/patient expects to be discharged to:: Private residence Living Arrangements: Spouse/significant other (wife w/c bound due to MS) Available Help at Discharge: Family Type of Home: House Home Access: Ramped entrance     Home Layout: One level Home Equipment: Environmental consultant - 2 wheels Additional Comments: Information per old chart    Prior Function Level of Independence: Independent         Comments: Information per old chart. In the past son and wife's sister have been able to assist at home if needed.     Hand Dominance   Dominant Hand: Right    Extremity/Trunk Assessment   Upper Extremity Assessment: Defer to OT evaluation           Lower Extremity Assessment: Generalized weakness         Communication   Communication: Receptive difficulties;Expressive difficulties  Cognition Arousal/Alertness: Awake/alert Behavior During Therapy: Impulsive Overall Cognitive Status: No family/caregiver present to determine baseline cognitive functioning Area of Impairment: Safety/judgement;Attention;Following commands;Problem solving   Current Attention Level: Focused   Following Commands: Follows one step commands inconsistently     Problem Solving: Requires verbal cues;Requires tactile cues;Slow processing  General Comments      Exercises        Assessment/Plan    PT Assessment Patient needs continued PT services  PT Diagnosis Difficulty walking;Abnormality of gait;Generalized weakness;Altered mental status   PT Problem List Decreased strength;Decreased balance;Decreased  mobility;Decreased cognition;Decreased knowledge of use of DME;Decreased safety awareness;Decreased knowledge of precautions  PT Treatment Interventions DME instruction;Balance training;Gait training;Functional mobility training;Cognitive remediation;Patient/family education;Therapeutic activities;Therapeutic exercise;Neuromuscular re-education   PT Goals (Current goals can be found in the Care Plan section) Acute Rehab PT Goals Patient Stated Goal: pt unable to state due to confusion PT Goal Formulation: Patient unable to participate in goal setting Time For Goal Achievement: 05/28/14 Potential to Achieve Goals: Good    Frequency Min 3X/week   Barriers to discharge Decreased caregiver support      Co-evaluation               End of Session Equipment Utilized During Treatment: Gait belt Activity Tolerance: Patient tolerated treatment well Patient left: in bed;with call bell/phone within reach;with bed alarm set Nurse Communication: Mobility status         Time: 1346-1400 PT Time Calculation (min) (ACUTE ONLY): 14 min   Charges:   PT Evaluation $Initial PT Evaluation Tier I: 1 Procedure     PT G Codes:        Katharin Schneider 19-May-2014, 2:21 PM  [facility] PT [phone]

## 2014-05-15 LAB — CBC
HEMATOCRIT: 29.4 % — AB (ref 39.0–52.0)
HEMOGLOBIN: 10.8 g/dL — AB (ref 13.0–17.0)
MCH: 37.8 pg — ABNORMAL HIGH (ref 26.0–34.0)
MCHC: 36.7 g/dL — ABNORMAL HIGH (ref 30.0–36.0)
MCV: 102.8 fL — AB (ref 78.0–100.0)
Platelets: 124 10*3/uL — ABNORMAL LOW (ref 150–400)
RBC: 2.86 MIL/uL — ABNORMAL LOW (ref 4.22–5.81)
RDW: 18.8 % — ABNORMAL HIGH (ref 11.5–15.5)
WBC: 4.4 10*3/uL (ref 4.0–10.5)

## 2014-05-15 LAB — MAGNESIUM: Magnesium: 1.5 mg/dL (ref 1.5–2.5)

## 2014-05-15 LAB — BASIC METABOLIC PANEL
Anion gap: 8 (ref 5–15)
BUN: 5 mg/dL — AB (ref 6–23)
CALCIUM: 8.6 mg/dL (ref 8.4–10.5)
CO2: 21 mmol/L (ref 19–32)
CREATININE: 0.55 mg/dL (ref 0.50–1.35)
Chloride: 112 mmol/L (ref 96–112)
GFR calc Af Amer: >90 mL/min (ref >90)
GFR calc non Af Amer: >90 mL/min (ref >90)
GLUCOSE: 80 mg/dL (ref 70–99)
Potassium: 3.4 mmol/L — ABNORMAL LOW (ref 3.5–5.1)
Sodium: 141 mmol/L (ref 135–145)

## 2014-05-15 LAB — PHOSPHORUS: PHOSPHORUS: 4.1 mg/dL (ref 2.3–4.6)

## 2014-05-15 MED ORDER — HYDRALAZINE HCL 20 MG/ML IJ SOLN: 5.0000 mg | INTRAMUSCULAR | Status: DC | PRN

## 2014-05-15 MED ORDER — CLONAZEPAM 1 MG PO TABS
1.0000 mg | ORAL_TABLET | Freq: Every day | ORAL | Status: DC
  Administered 2014-05-16 – 2014-05-20 (×5): 1 mg via ORAL
  Filled 2014-05-15 (×5): qty 1

## 2014-05-15 MED ORDER — CLONAZEPAM 0.5 MG PO TABS: 1.0000 mg | ORAL_TABLET | Freq: Two times a day (BID) | ORAL | Status: DC

## 2014-05-15 MED ORDER — QUETIAPINE FUMARATE 100 MG PO TABS
100.0000 mg | ORAL_TABLET | Freq: Every day | ORAL | Status: DC
  Administered 2014-05-16 – 2014-05-17 (×2): 100 mg via ORAL
  Filled 2014-05-15 (×3): qty 1

## 2014-05-15 MED ORDER — SODIUM CHLORIDE 0.9 % IV BOLUS (SEPSIS)
1000.0000 mL | Freq: Once | INTRAVENOUS | Status: AC
  Administered 2014-05-15: 1000 mL via INTRAVENOUS

## 2014-05-15 MED ORDER — LORAZEPAM 2 MG/ML IJ SOLN
0.5000 mg | INTRAMUSCULAR | Status: DC | PRN
  Administered 2014-05-16 – 2014-05-18 (×5): 1 mg via INTRAVENOUS
  Filled 2014-05-15 (×5): qty 1

## 2014-05-15 MED ORDER — CETYLPYRIDINIUM CHLORIDE 0.05 % MT LIQD
7.0000 mL | Freq: Two times a day (BID) | OROMUCOSAL | Status: DC
  Administered 2014-05-15 – 2014-05-17 (×4): 7 mL via OROMUCOSAL

## 2014-05-15 MED ORDER — LORAZEPAM 2 MG/ML IJ SOLN: 0.5000 mg | INTRAMUSCULAR | Status: DC

## 2014-05-15 MED ORDER — KCL IN DEXTROSE-NACL 40-5-0.45 MEQ/L-%-% IV SOLN
INTRAVENOUS | Status: DC
  Administered 2014-05-15 – 2014-05-16 (×2): via INTRAVENOUS
  Filled 2014-05-15 (×2): qty 1000

## 2014-05-15 NOTE — Progress Notes (Signed)
eLink Physician-Brief Progress Note Patient Name: Miguel Hanson DOB: 1945/09/18 MRN: [id number]   Date of Service  05/15/2014  HPI/Events of Note  Called with multiple issues: 1. Temperature = 93.4 F. 2. Bladder residual = 600 mL. Patient has a condom cath.   eICU Interventions  Will I&O cath now. If residual reoccurs, will place foley catheter.     Intervention Category Minor Interventions: Routine modifications to care plan (e.g. PRN medications for pain, fever)  Aunesti Pellegrino Eugene 05/15/2014, 4:56 PM

## 2014-05-15 NOTE — Progress Notes (Signed)
eLink Physician-Brief Progress Note Patient Name: Miguel Hanson DOB: Feb 24, 1946 MRN: [id number]   Date of Service  05/15/2014  HPI/Events of Note  Patient given Hydralazine 10 mg IV for SBP = 180+. Bair Hugger applied for hypothermia. BP is now = 64/45. Hypotension related to Hydralazine and vasodilation d/t warming.  eICU Interventions  Will order: 1. Bolus with 0.9 NaCl 1 liver over 1 hour now.  2. Change Hydralazine dose to 5 mg IV Q 2 hours PRN.      Intervention Category Major Interventions: Hypotension - evaluation and management  Lottie Siska Eugene 05/15/2014, 7:02 PM

## 2014-05-15 NOTE — Progress Notes (Signed)
PULMONARY / CRITICAL CARE MEDICINE   Name: Manases Etchison MRN: [id number] DOB: October 08, 1945    ADMISSION DATE:  05/08/2014 CONSULTATION DATE:  05/15/2014  REFERRING MD :  Thereasa Solo  CHIEF COMPLAINT:  ETOH withdrawal  INITIAL PRESENTATION:  69 y.o. M brought to [facility] ED 3/8 for confusion, garbled speech, and focal seizure like activity. He was admitted for AMS, seizure disorder, ETOH abuse.  On 3/9, he remained agitated despite high doses of ativan.  He was transferred to ICU and started on precedex.   STUDIES:  CT head 3/8 >>> Small vessel disease, global atrophy without hydrocephalus, air fluid level ? Sinusitis.  MRI / MRA brain 3/8 >>> No acute infarct or mass.  Moderate atrophy, mild to mod chronic microvascular ischemia.  Mild stenosis right MCA branch.  SIGNIFICANT EVENTS: 3/8 - admit 3/9 - transferred to ICU, started on precedex 3/15 agitated off precedex  SUBJECTIVE:off  Agitated   VITAL SIGNS: Temp:  [97 F (36.1 C)-98.7 F (37.1 C)] 97 F (36.1 C) (03/15 1226) Pulse Rate:  [55-148] 62 (03/15 1200) Resp:  [11-27] 16 (03/15 1200) BP: (106-176)/(70-106) 125/77 mmHg (03/15 1200) SpO2:  [90 %-100 %] 100 % (03/15 1200)    INTAKE / OUTPUT: Intake/Output      03/14 0701 - 03/15 0700 03/15 0701 - 03/16 0700   P.O. 50 360   I.V. (mL/kg) 432.7 (8.7) 100 (2)   IV Piggyback 300 100   Total Intake(mL/kg) 782.7 (15.7) 560 (11.2)   Urine (mL/kg/hr) 850 (0.7)    Total Output 850     Net -67.3 +560         PHYSICAL EXAMINATION: General: RASS -2 to +1. Intermittently F/C Neuro: no focal deficits HEENT: [place]/AT Cardiovascular: Reg, no M Lungs: clear Abdomen: Soft, + BS Ext: no edema  LABS: I have reviewed all of today's lab results. Relevant abnormalities are discussed in the A/P section  CXR: NNF   ASSESSMENT / PLAN:  NEUROLOGIC A:   ETOH withdrawal Acute encephalopathy Seizure disorder  [name], resolved Polysubstance abuse Hx PTSD, depression P:   Cont  dex through today. Try to DC in AM 3/16 Add HS dose of clonazepam Change quetiapine to HS only Reduce dose of PRN lorazepam Cont thiamine  CARDIOVASCULAR A:  HTN Prolonged QTc  Chronic diastolic heart failure (ECHO from 05/08/14 with EF 55-60%, grade 1 DD) P:  Lopressor PRN Hydralazine PRN. Monitor  PULMONARY A: No acute issues, protecting airway P:   Monitor for airway protection.  RENAL A:   Hypomagnesemia hypokalemia P:   Monitor BMET intermittently Monitor I/Os Correct electrolytes as indicated IVFs resumed 3/15 due to limited PO intake  GASTROINTESTINAL A:   Elevated LFTs Protein calorie mal nutrition P:   SUP N/I Cont diet with supervision  HEMATOLOGIC A:   Macrocytic anemia  Thrombocytopenia  P:  DVT px: SCDs Monitor CBC intermittently Transfuse per usual ICU guidelines   INFECTIOUS A:   No acute issues P:   Monitor clinically.  ENDOCRINE A:   No acute issues P:   No interventions required.  Merton Border, MD ; [facility] [phone].  After 5:30 PM or weekends, call [phone]

## 2014-05-16 LAB — CBC
HCT: 30.6 % — ABNORMAL LOW (ref 39.0–52.0)
Hemoglobin: 11 g/dL — ABNORMAL LOW (ref 13.0–17.0)
MCH: 37.7 pg — ABNORMAL HIGH (ref 26.0–34.0)
MCHC: 35.9 g/dL (ref 30.0–36.0)
MCV: 104.8 fL — ABNORMAL HIGH (ref 78.0–100.0)
PLATELETS: 135 10*3/uL — AB (ref 150–400)
RBC: 2.92 MIL/uL — ABNORMAL LOW (ref 4.22–5.81)
RDW: 19.1 % — AB (ref 11.5–15.5)
WBC: 5.3 10*3/uL (ref 4.0–10.5)

## 2014-05-16 LAB — BASIC METABOLIC PANEL
Anion gap: 5 (ref 5–15)
BUN: <5 mg/dL — ABNORMAL LOW (ref 6–23)
CALCIUM: 8.8 mg/dL (ref 8.4–10.5)
CO2: 23 mmol/L (ref 19–32)
Chloride: 110 mmol/L (ref 96–112)
Creatinine, Ser: 0.58 mg/dL (ref 0.50–1.35)
Glucose, Bld: 81 mg/dL (ref 70–99)
Potassium: 4 mmol/L (ref 3.5–5.1)
SODIUM: 138 mmol/L (ref 135–145)

## 2014-05-16 MED ORDER — FOLIC ACID 1 MG PO TABS
1.0000 mg | ORAL_TABLET | Freq: Every day | ORAL | Status: DC
  Administered 2014-05-17 – 2014-05-21 (×5): 1 mg via ORAL
  Filled 2014-05-16 (×6): qty 1

## 2014-05-16 MED ORDER — ENSURE COMPLETE PO LIQD
237.0000 mL | Freq: Two times a day (BID) | ORAL | Status: DC
  Administered 2014-05-16 – 2014-05-21 (×9): 237 mL via ORAL

## 2014-05-16 MED ORDER — VITAMIN B-1 100 MG PO TABS
100.0000 mg | ORAL_TABLET | Freq: Every day | ORAL | Status: DC
  Administered 2014-05-17 – 2014-05-21 (×5): 100 mg via ORAL
  Filled 2014-05-16 (×6): qty 1

## 2014-05-16 NOTE — Progress Notes (Signed)
NUTRITION FOLLOW UP  DOCUMENTATION CODES Per approved criteria  -Severe malnutrition in the context of chronic illness   Pt meets criteria for severe MALNUTRITION in the context of chronic illness as evidenced by severe fat and muscle depletion.       Intervention:   -Ensure Enlive BID each providing 350 kcal and 20 g protein -Monitor PO intake  Nutrition Dx:   Malnutrition related to decreased oral intake, ETOH abuse as evidenced by severe fat and muscle depletion; ongoing.  Goal:   Pt will meet >90% of estimated nutritional needs; progressing.  Monitor:   PO/supplement intake, labs, weight changes, I/O's  Assessment:   Pt admitted with seizures. He has a hx of heavy ETOH abuse.  Pt disoriented/confused, poor historian.   PoO intake improving, about 75% per Nurse Tech, pt consumed everything on his tray this morning except for the eggs. Nurse Tech reports he drinks fluids very well and will likely enjoy a supplement.  Was previously ordered until diet changed to NPO.  Will reorder.  Per wt records, pt has gained 10 lbs since admission.  Transfer to med surg today.  Height: Ht Readings from Last 1 Encounters:  05/08/14 5\' 6"  (1.676 m)    Weight Status:   Wt Readings from Last 1 Encounters:  05/15/14 120 lb 13 oz (54.8 kg)   05/08/14 110 lb 3.7 oz (50 kg)         Re-estimated needs:  Kcal: 1500-1700 Protein: 75-95 grams Fluid: 1.5-1.7 L  Skin: intact  Diet Order: DIET SOFT   Intake/Output Summary (Last 24 hours) at 05/16/14 0948 Last data filed at 05/16/14 0800  Gross per 24 hour  Intake 1560.1 ml  Output   1350 ml  Net  210.1 ml    Last BM: 3/13   Labs:   Recent Labs Lab 05/13/14 0345 05/13/14 2048 05/14/14 0343 05/15/14 0215 05/16/14 0314  NA 135  --  140 141 138  K 3.2*  --  4.4 3.4* 4.0  CL 109  --  110 112 110  CO2 21  --  22 21 23   BUN <5*  --  <5* 5* <5*  CREATININE 0.49*  --  0.47* 0.55 0.58  CALCIUM 8.4  --  9.1 8.6 8.8   MG 1.3* 2.2 2.0 1.5  --   PHOS 2.7  --   --  4.1  --   GLUCOSE 92  --  100* 80 81    CBG (last 3)  No results for input(s): GLUCAP in the last 72 hours.  Scheduled Meds: . antiseptic oral rinse  7 mL Mouth Rinse BID  . clonazePAM  1 mg Oral QHS  . cloNIDine  0.2 mg Transdermal Weekly  . folic acid  1 mg Oral Daily  . levETIRAcetam  500 mg Intravenous Q12H  . QUEtiapine  100 mg Oral QHS  . thiamine  100 mg Oral Daily    Continuous Infusions: . sodium chloride 10 mL/hr at 05/14/14 2300  . dextrose 5 % and 0.45 % NaCl with KCl 40 mEq/L 50 mL/hr at 05/16/14 [place] MS Dietetic Intern Pager Number [phone]   I agree with student dietitian note; appropriate revisions have been made.  Molli Barrows, RD, LDN, [place] Pager# [phone] After Hours Pager# [phone]

## 2014-05-16 NOTE — Clinical Social Work Note (Signed)
Clinical Social Worker attempted to contact patient's wife to complete psychosocial assessment however unable to leave voice message.   CSW will continue to follow.   Glendon Axe, MSW, LCSWA [phone] 05/16/2014 9:47 AM

## 2014-05-16 NOTE — Progress Notes (Signed)
PULMONARY / CRITICAL CARE MEDICINE   Name: Miguel Hanson MRN: [id number] DOB: [birth year]    ADMISSION DATE:  05/08/2014 CONSULTATION DATE:  05/16/2014  REFERRING MD :  Miguel Hanson  CHIEF COMPLAINT:  ETOH withdrawal  INITIAL PRESENTATION:  69 y.o. M brought to [facility] ED 3/8 for confusion, garbled speech, and focal seizure like activity. He was admitted for AMS, seizure disorder, ETOH abuse.  On 3/9, he remained agitated in setting ETOH withdrawal despite high doses of ativan.  He was transferred to ICU and started on precedex.   STUDIES:  CT head 3/8 >>> Small vessel disease, global atrophy without hydrocephalus, air fluid level ? Sinusitis.  MRI / MRA brain 3/8 >>> No acute infarct or mass.  Moderate atrophy, mild to mod chronic microvascular ischemia.  Mild stenosis right MCA branch.  SIGNIFICANT EVENTS: 3/8 - admit 3/9 - transferred to ICU, started on precedex 3/15 agitated off precedex 3/16 off precedex, improved   SUBJECTIVE:  More oriented this am, off precedex.  Still intermittently agitated but easily re-oriented.  Brief episode hypotension overnight.    VITAL SIGNS: Temp:  [93.4 F (34.1 C)-98.5 F (36.9 C)] 98.5 F (36.9 C) (03/16 0722) Pulse Rate:  [61-97] 97 (03/16 0800) Resp:  [11-25] 15 (03/16 0800) BP: (63-173)/(45-124) 142/95 mmHg (03/16 0800) SpO2:  [96 %-100 %] 100 % (03/16 0800) Weight:  [120 lb 13 oz (54.8 kg)] 120 lb 13 oz (54.8 kg) (03/15 2000)    INTAKE / OUTPUT: Intake/Output      03/15 0701 - 03/16 0700 03/16 0701 - 03/17 0700   P.O. 360    I.V. (mL/kg) 400.1 (7.3) 10 (0.2)   IV Piggyback 1200    Total Intake(mL/kg) 1960.1 (35.8) 10 (0.2)   Urine (mL/kg/hr) 1350 (1)    Total Output 1350     Net +610.1 +10         PHYSICAL EXAMINATION: General: chronically ill appearing male, NAD  Neuro: awake alert, RASS 0, occasional agitation per RN but much improved, follows commands  HEENT: Miguel Hanson/AT Cardiovascular: Reg, no M Lungs: resps even non labored on [place],  clear Abdomen: Soft, + BS Ext: no edema  LABS: I have reviewed all of today's lab results. Relevant abnormalities are discussed in the A/P section  CXR: NNF   ASSESSMENT / PLAN:  NEUROLOGIC A:   ETOH withdrawal Acute encephalopathy Seizure disorder  [name], resolved Polysubstance abuse Hx PTSD, depression P:   D/c precedex  Cont HS dose of clonazepam, seroquel  PRN lorazepam Cont thiamine, folate - change to PO  Cont keppra  Clonidine patch   CARDIOVASCULAR A:  HTN Prolonged QTc  Chronic diastolic heart failure (ECHO from 05/08/14 with EF 55-60%, grade 1 DD) P:  Lopressor PRN Hydralazine PRN. Cont clonidine patch   PULMONARY A: No acute issues, protecting airway P:   Monitor for airway protection.  RENAL A:   Hypomagnesemia hypokalemia P:   Monitor BMET intermittently Monitor I/Os Correct electrolytes as indicated IVFs resumed 3/15 due to limited PO intake  GASTROINTESTINAL A:   Elevated LFTs Protein calorie mal nutrition P:   SUP N/I Cont diet with supervision  HEMATOLOGIC A:   Macrocytic anemia  Thrombocytopenia  P:  DVT px: SCDs Monitor CBC intermittently Transfuse per usual ICU guidelines   INFECTIOUS A:   No acute issues P:   Monitor clinically.  ENDOCRINE A:   No acute issues P:   No interventions required.   Improving slowly.  Off precedex.  Will tx back to  SDU and have [facility] assume care 3/17.   Miguel Madrid, NP 05/16/2014  9:13 AM Pager: [phone] or [phone]  PCCM ATTENDING: I have reviewed pt's initial presentation, consultants notes and hospital database in detail.  The above assessment and plan was formulated under my direction.  In summary: EtOH withdrawal - now improved and off precedex Transfer to med-surg floor TR[facility] to resume care as of AM 3/17 and PCCM to sign off Discussed with Dr Miguel Hanson   Miguel Border, MD;  PCCM service; Mobile [phone]

## 2014-05-16 NOTE — Progress Notes (Signed)
Physical Therapy Treatment Patient Details Name: Miguel Hanson MRN: [id number] DOB: 1945/03/18 Today's Date: 05/16/2014    History of Present Illness Pt adm aphasia and focal seizure-like activity on the right. CT and MRI negative. Pt developed DT's and transferred to ICU and PT was dc'd. Pt now improving and PT reordered. PMH - polysubstance abuse, PTSD, COPD, HTN, seizure.    PT Comments    Progressing towards physical therapy goals, ambulating slowly with min assist and max verbal cues for safety, directions, and sequencing. Easily distracted and perseverating on single exercises pt performed today. HR elevated to 130 while ambulating, SpO2 100% on room air. Patient will continue to benefit from skilled physical therapy services to further improve independence with functional mobility.   Follow Up Recommendations  SNF     Equipment Recommendations  None recommended by PT    Recommendations for Other Services       Precautions / Restrictions Precautions Precautions: Fall Restrictions Weight Bearing Restrictions: No    Mobility  Bed Mobility Overal bed mobility: Needs Assistance Bed Mobility: Supine to Sit;Sit to Supine;Rolling Rolling: Min guard   Supine to sit: Min assist Sit to supine: Min assist   General bed mobility comments: Min assist to scoot forward with bed pad to edge of bed. Cues for technique. Min assist for LE support and sequencing to transition to supine from seated position. Max cues to facilitate rolling in bed to Lt and right. Able to bridge to scoot up in bed.  Transfers Overall transfer level: Needs assistance Equipment used: Rolling walker (2 wheeled) Transfers: Sit to/from Stand Sit to Stand: Min assist         General transfer comment: Min assist for boost and balance to stand from lowest bed setting. Hand over hand assist for technique and hand placement with max VC for sequencing.  Ambulation/Gait Ambulation/Gait assistance: Min  assist Ambulation Distance (Feet): 80 Feet Assistive device: Rolling walker (2 wheeled) Gait Pattern/deviations: Step-through pattern;Decreased stride length;Shuffle;Drifts right/left;Trunk flexed;Narrow base of support Gait velocity: decreased   General Gait Details: Educated on safe DME use with a rolling walker. Min assist for walker control. Max VC for sequencing and direction. easily distracted and forgets task at hand (ambulating). No buckling noted although moderately shaky during bout. Further cues for greater stride length.   Stairs            Wheelchair Mobility    Modified Rankin (Stroke Patients Only)       Balance                                    Cognition Arousal/Alertness: Awake/alert Behavior During Therapy: Impulsive Overall Cognitive Status: No family/caregiver present to determine baseline cognitive functioning Area of Impairment: Safety/judgement;Attention;Following commands;Problem solving;Orientation Orientation Level: Disoriented to;Place;Situation Current Attention Level: Focused Memory: Decreased short-term memory Following Commands: Follows one step commands inconsistently Safety/Judgement: Decreased awareness of safety;Decreased awareness of deficits   Problem Solving: Requires verbal cues;Requires tactile cues;Slow processing;Decreased initiation;Difficulty sequencing General Comments: Perseverating on single exercise patient practiced. having a difficult time performing other tasks due to continued long arc quad exercise on RLE.    Exercises General Exercises - Lower Extremity Long Arc Quad: Strengthening;Both;20 reps;Seated    General Comments General comments (skin integrity, edema, etc.): Perseverating on single exercises. Needs frequent cues for task at hand.      Pertinent Vitals/Pain Pain Assessment: No/denies pain  HR at rest 109 -  Ambulating reached 130 SpO2 100% on RA    Home Living                       Prior Function            PT Goals (current goals can now be found in the care plan section) Acute Rehab PT Goals PT Goal Formulation: Patient unable to participate in goal setting Time For Goal Achievement: 05/28/14 Potential to Achieve Goals: Good Progress towards PT goals: Progressing toward goals    Frequency  Min 3X/week    PT Plan Current plan remains appropriate    Co-evaluation             End of Session Equipment Utilized During Treatment: Gait belt         Time: 1110-1136 PT Time Calculation (min) (ACUTE ONLY): 26 min  Charges:  $Gait Training: 8-22 mins $Therapeutic Activity: 8-22 mins                    G Codes:      Ellouise Newer 06-14-2014, 12:03 PM Elayne Snare, [place]

## 2014-05-17 LAB — BASIC METABOLIC PANEL
ANION GAP: 12 (ref 5–15)
CALCIUM: 9.2 mg/dL (ref 8.4–10.5)
CHLORIDE: 106 mmol/L (ref 96–112)
CO2: 21 mmol/L (ref 19–32)
Creatinine, Ser: 0.57 mg/dL (ref 0.50–1.35)
GFR calc Af Amer: >90 mL/min (ref >90)
GLUCOSE: 87 mg/dL (ref 70–99)
POTASSIUM: 3.5 mmol/L (ref 3.5–5.1)
SODIUM: 139 mmol/L (ref 135–145)

## 2014-05-17 MED ORDER — CHLORDIAZEPOXIDE HCL 5 MG PO CAPS
10.0000 mg | ORAL_CAPSULE | Freq: Three times a day (TID) | ORAL | Status: DC
  Administered 2014-05-17 – 2014-05-19 (×7): 10 mg via ORAL
  Filled 2014-05-17 (×7): qty 2

## 2014-05-17 MED ORDER — LEVETIRACETAM 500 MG PO TABS
500.0000 mg | ORAL_TABLET | Freq: Two times a day (BID) | ORAL | Status: DC
  Administered 2014-05-17 – 2014-05-21 (×9): 500 mg via ORAL
  Filled 2014-05-17 (×9): qty 1

## 2014-05-17 MED ORDER — DIPHENHYDRAMINE HCL 50 MG/ML IJ SOLN: 12.5000 mg | Freq: Three times a day (TID) | INTRAMUSCULAR | Status: DC | PRN

## 2014-05-17 NOTE — Progress Notes (Addendum)
Patient constantly getting OOB;very unsteady gait, pulling at IV. He is confused and is looking for tickets and a nickel or dime.  Unable to reorient patient.  No sitter available and no family available to stay with patient.  Dr Erlinda Hong notified and order for waist belt obtained.  Explained to patient that we would take off the waist belt if he would not get out of bed without assistance.  Patient says he is getting ready to go home.  Placed him in waist belt.  Bed alarm on.

## 2014-05-17 NOTE — Progress Notes (Signed)
PROGRESS NOTE  Miguel Hanson NKN:[id number] DOB: August 20, 1945 DOA: 05/08/2014 PCP: [facility]  HPI/Recap of past 24 hours: Alert, sitting in chair with sitter at bedside. Confused, constantly trying to get up, know his own birthday, know he is in the hospital at [facility]. Know this is 2016, not knowing the month, Poor insight.  Assessment/Plan: Principal Problem:   Expressive aphasia Active Problems:   Seizure disorder   Alcohol abuse   PTSD (post-traumatic stress disorder)   COPD (chronic obstructive pulmonary disease)   Altered mental status   HTN (hypertension), malignant   Prolonged Q-T interval on ECG   Chronic diastolic heart failure, NYHA class 1   Macrocytic anemia   Confusion   Encephalopathy acute   Alcohol withdrawal delirium  Expressive aphasia/altered mental status -was admitted to hospitalist service initially, transferred to icu due to worsening mental status and received precedex drip, transferred to hospitalist service on 3/17 after weaning off precedex -per neurology AMS likely related to Wernicke's encephalopathy/Korsakoff syndrome - Ct head/MRI/MRA brain negative -2-D echocardiogram, no acute findings -improving ,talkin in full sentence today on 3/17    Seizure disorder -Had focal myoclonic jerking activity on presentation, resolved -EEG with left hemispheric slowing and triphasic waves -Continue Keppra  Per neurology recommendation   Alcohol abuse -Patient unable to quantify amount of alcohol he drinks daily but during previous admissions he is admitted to drinking the "one of a pint of brandy every 2 days; likely more -Family reported he had increased his alcohol intake -on CIWA protocol, on librium, prn ativan, in restrain for fall precaution/sitting in room, taper off librium. On clonidine for now   COPD  -Compensated without current wheezing   HTN , malignant -Current blood pressure controlled -on clonidine  For now   Prolonged  Q-T interval on ECG -Current QTC greater than 500 ms -Previous EKGs have averaged QTC readings between 475 ms and 507 ms - on Seroquel, monitor QTc   PTSD    Chronic diastolic heart failure, NYHA class 1 -Appears compensated at the present time   Macrocytic anemia/thrombocytopenia - TSH wnl -Hemoglobin stable at baseline of around 12 -Current platelets 99,000 and during last admission ranged between 102,000 and 137,000    DVT Prophylaxis: SCDs  Family Communication: No family at bedside  Code Status: Full code  Condition: Stable  Disposition Plan: pending   Consultants:  Neurology  pccm  Procedures:  EEG  Antibiotics:  None   Objective: BP 137/99 mmHg  Pulse 81  Temp(Src) 98.5 F (36.9 C) (Oral)  Resp 17  Ht 5\' 6"  (1.676 m)  Wt 54.8 kg (120 lb 13 oz)  BMI 19.51 kg/m2  SpO2 100%  Intake/Output Summary (Last 24 hours) at 05/17/14 1327 Last data filed at 05/17/14 0845  Gross per 24 hour  Intake    840 ml  Output   1000 ml  Net   -160 ml   Filed Weights   05/08/14 0837 05/08/14 1956 05/15/14 2000  Weight: 54.432 kg (120 lb) 50 kg (110 lb 3.7 oz) 54.8 kg (120 lb 13 oz)    Exam:   General:  Confused, does not following commend  Cardiovascular: sinus tachycardia  Respiratory: CATBL  Abdomen: soft/NT/ND, positive bowel sounds  Musculoskeletal: no edema, moving all extremities  Data Reviewed: Basic Metabolic Panel:  Recent Labs Lab 05/11/14 0251  05/13/14 0345 05/13/14 2048 05/14/14 0343 05/15/14 0215 05/16/14 0314 05/17/14 0628  NA 139  < > 135  --  140 141 138  139  K 3.7  < > 3.2*  --  4.4 3.4* 4.0 3.5  CL 108  < > 109  --  110 112 110 106  CO2 21  < > 21  --  22 21 23 21   GLUCOSE 83  < > 92  --  100* 80 81 87  BUN <5*  < > <5*  --  <5* 5* <5* <5*  CREATININE 0.57  < > 0.49*  --  0.47* 0.55 0.58 0.57  CALCIUM 8.2*  < > 8.4  --  9.1 8.6 8.8 9.2  MG 1.7  --  1.3* 2.2 2.0 1.5  --   --   PHOS  --   --  2.7  --   --  4.1   --   --   < > = values in this interval not displayed. Liver Function Tests: No results for input(s): AST, ALT, ALKPHOS, BILITOT, PROT, ALBUMIN in the last 168 hours. No results for input(s): LIPASE, AMYLASE in the last 168 hours. No results for input(s): AMMONIA in the last 168 hours. CBC:  Recent Labs Lab 05/11/14 0251 05/12/14 0320 05/15/14 0215 05/16/14 0314  WBC 11.0* 8.3 4.4 5.3  NEUTROABS 8.3*  --   --   --   HGB 11.0* 10.6* 10.8* 11.0*  HCT 30.5* 29.0* 29.4* 30.6*  MCV 105.2* 104.7* 102.8* 104.8*  PLT 102* 102* 124* 135*   Cardiac Enzymes:   No results for input(s): CKTOTAL, CKMB, CKMBINDEX, TROPONINI in the last 168 hours. BNP (last 3 results) No results for input(s): BNP in the last 8760 hours.  ProBNP (last 3 results) No results for input(s): PROBNP in the last 8760 hours.  CBG: No results for input(s): GLUCAP in the last 168 hours.  Recent Results (from the past 240 hour(s))  MRSA PCR Screening     Status: None   Collection Time: 05/08/14  7:56 PM  Result Value Ref Range Status   MRSA by PCR NEGATIVE NEGATIVE Final    Comment:        The GeneXpert MRSA Assay (FDA approved for NASAL specimens only), is one component of a comprehensive MRSA colonization surveillance program. It is not intended to diagnose MRSA infection nor to guide or monitor treatment for MRSA infections.      Studies: No results found.  Scheduled Meds: . antiseptic oral rinse  7 mL Mouth Rinse BID  . chlordiazePOXIDE  10 mg Oral TID  . clonazePAM  1 mg Oral QHS  . cloNIDine  0.2 mg Transdermal Weekly  . feeding supplement (ENSURE COMPLETE)  237 mL Oral BID BM  . folic acid  1 mg Oral Daily  . levETIRAcetam  500 mg Oral BID  . QUEtiapine  100 mg Oral QHS  . thiamine  100 mg Oral Daily    Continuous Infusions: . sodium chloride Stopped (05/16/14 1200)       Alajiah Dutkiewicz  [facility] Pager [phone]. If 7PM-7AM, please contact night-coverage at [email or web address],  password [facility] 05/17/2014, 1:27 PM  LOS: 9 days

## 2014-05-18 LAB — BASIC METABOLIC PANEL
Anion gap: 9 (ref 5–15)
CALCIUM: 9.5 mg/dL (ref 8.4–10.5)
CO2: 27 mmol/L (ref 19–32)
Chloride: 101 mmol/L (ref 96–112)
Creatinine, Ser: 0.54 mg/dL (ref 0.50–1.35)
GFR calc Af Amer: >90 mL/min (ref >90)
GLUCOSE: 85 mg/dL (ref 70–99)
Potassium: 3.3 mmol/L — ABNORMAL LOW (ref 3.5–5.1)
Sodium: 137 mmol/L (ref 135–145)

## 2014-05-18 LAB — CBC
HEMATOCRIT: 32.1 % — AB (ref 39.0–52.0)
Hemoglobin: 11.6 g/dL — ABNORMAL LOW (ref 13.0–17.0)
MCH: 37.3 pg — ABNORMAL HIGH (ref 26.0–34.0)
MCHC: 36.1 g/dL — ABNORMAL HIGH (ref 30.0–36.0)
MCV: 103.2 fL — ABNORMAL HIGH (ref 78.0–100.0)
Platelets: 143 10*3/uL — ABNORMAL LOW (ref 150–400)
RBC: 3.11 MIL/uL — AB (ref 4.22–5.81)
RDW: 19.2 % — ABNORMAL HIGH (ref 11.5–15.5)
WBC: 7.2 10*3/uL (ref 4.0–10.5)

## 2014-05-18 LAB — MAGNESIUM: MAGNESIUM: 1.5 mg/dL (ref 1.5–2.5)

## 2014-05-18 MED ORDER — POTASSIUM CHLORIDE CRYS ER 20 MEQ PO TBCR
40.0000 meq | EXTENDED_RELEASE_TABLET | Freq: Once | ORAL | Status: AC
  Administered 2014-05-18: 40 meq via ORAL
  Filled 2014-05-18: qty 2

## 2014-05-18 MED ORDER — MAGNESIUM SULFATE 2 GM/50ML IV SOLN
2.0000 g | Freq: Once | INTRAVENOUS | Status: AC
  Administered 2014-05-18: 2 g via INTRAVENOUS
  Filled 2014-05-18 (×2): qty 50

## 2014-05-18 MED ORDER — METOPROLOL TARTRATE 12.5 MG HALF TABLET
12.5000 mg | ORAL_TABLET | Freq: Two times a day (BID) | ORAL | Status: DC
  Administered 2014-05-18 – 2014-05-21 (×7): 12.5 mg via ORAL
  Filled 2014-05-18 (×7): qty 1

## 2014-05-18 MED ORDER — QUETIAPINE FUMARATE 50 MG PO TABS
50.0000 mg | ORAL_TABLET | Freq: Every day | ORAL | Status: DC
  Administered 2014-05-18: 50 mg via ORAL
  Filled 2014-05-18: qty 1

## 2014-05-18 NOTE — Clinical Social Work Note (Signed)
CSW met with patient who was still confused.  CSW discussed with patient about SNF placement and he did not understand it was for him, he was thinking it was about his wife who is in a wheelchair.  Patient states his nephew is helping take care of his wife while he is in the hospital.  CSW faxed out patient for bed offers and also faxed [facility] for a [facility] 2 [facility] number which has not been received yet.  CSW will ask weekend CSW to check for [facility] number and to try to get a hold of some family.  Jones Broom. [place], MSW, [place] 05/18/2014 4:49 PM

## 2014-05-18 NOTE — Progress Notes (Signed)
Physical Therapy Treatment Patient Details Name: Miguel Hanson MRN: [id number] DOB: 1946-02-20 Today's Date: 05/18/2014    History of Present Illness Pt adm aphasia and focal seizure-like activity on the right. CT and MRI negative. Pt developed DT's and transferred to ICU and PT was dc'd. Pt now improving and PT reordered. PMH - polysubstance abuse, PTSD, COPD, HTN, seizure.    PT Comments    Patient continues to be limited by cognitive deficits, decreased awareness and decreased safety. Attempted pathfinding back to room this session but patient with STM deficits and unable to recall room number after seconds of being told. Patient is not able to return home and be care giver for his wife. Continue to recommend SNF for ongoing Physical Therapy.     Follow Up Recommendations  SNF     Equipment Recommendations  None recommended by PT    Recommendations for Other Services       Precautions / Restrictions Precautions Precautions: Fall Precaution Comments: confusion Restrictions Weight Bearing Restrictions: No    Mobility  Bed Mobility               General bed mobility comments: Patient sitting on couch upon arrival  Transfers Overall transfer level: Needs assistance Equipment used: Rolling walker (2 wheeled)   Sit to Stand: Min assist         General transfer comment: Min assist for boost and balance to stand. Cues for sequnce and hand placement  Ambulation/Gait Ambulation/Gait assistance: Mod assist Ambulation Distance (Feet): 200 Feet Assistive device: 1 person hand held assist;Rolling walker (2 wheeled) Gait Pattern/deviations: Step-through pattern;Decreased stride length;Shuffle;Scissoring;Drifts right/left;Narrow base of support Gait velocity: decreased   General Gait Details: Patient attempting with and without RW. REquired Mod A for both as he is unsafe with use of RW (cannot avoid obtacles and runs into the wall) and he required HHA without AD for  balance and instability. Patient required Mod A to prevent LOB with turning.    Stairs            Wheelchair Mobility    Modified Rankin (Stroke Patients Only)       Balance     Sitting balance-Leahy Scale: Fair       Standing balance-Leahy Scale: Poor                 High Level Balance Comments: Mod A with any balance outside BOS.     Cognition Arousal/Alertness: Awake/alert Behavior During Therapy: Impulsive Overall Cognitive Status: No family/caregiver present to determine baseline cognitive functioning Area of Impairment: Safety/judgement;Attention;Following commands;Problem solving;Orientation Orientation Level: Disoriented to;Place;Situation;Time Current Attention Level: Focused Memory: Decreased short-term memory Following Commands: Follows one step commands inconsistently Safety/Judgement: Decreased awareness of safety;Decreased awareness of deficits   Problem Solving: Requires verbal cues;Requires tactile cues;Slow processing;Decreased initiation;Difficulty sequencing General Comments: Patient stating that he is safe to go home and ready to go home. Patient is at times Max A for ambulation (especially with turns). Sitter present. Unable to recall room number with pathfinding.     Exercises      General Comments        Pertinent Vitals/Pain Pain Assessment: No/denies pain    Home Living                      Prior Function            PT Goals (current goals can now be found in the care plan section) Progress towards PT goals: Progressing toward goals  Frequency  Min 3X/week    PT Plan Current plan remains appropriate    Co-evaluation             End of Session Equipment Utilized During Treatment: Gait belt Activity Tolerance: Patient tolerated treatment well Patient left: in chair;with call bell/phone within reach;with nursing/sitter in room     Time: 1135-1155 PT Time Calculation (min) (ACUTE ONLY): 20  min  Charges:  $Gait Training: 8-22 mins                    G Codes:      Jacqualyn Posey 05/18/2014, 1:48 PM  05/18/2014 Jacqualyn Posey PTA [phone] pager [phone] office

## 2014-05-18 NOTE — Clinical Social Work Psychosocial (Signed)
Clinical Social Work Department BRIEF PSYCHOSOCIAL ASSESSMENT 05/18/2014  Patient:  Miguel Hanson, Miguel Hanson     Account Number:  [id number]     Admit date:  05/08/2014  Clinical Social Worker:  Dian Queen  Date/Time:  05/18/2014 04:36 PM  Referred by:  Physician  Date Referred:  05/18/2014 Referred for  SNF Placement   Other Referral:   Interview type:  Patient Other interview type:    PSYCHOSOCIAL DATA Living Status:  WIFE Admitted from facility:   Level of care:   Primary support name:  Patient does not have much support at home Primary support relationship to patient:   Degree of support available:   Patient has a wife in a wheelchair at home who is MS so he helps take care of her.  Patient states he does not have anyone else at home.    CURRENT CONCERNS Current Concerns  Post-Acute Placement  Substance Abuse   Other Concerns:    SOCIAL WORK ASSESSMENT / PLAN Patient is a 69 year old male who lives with his wife. Patient was alert and oriented x2.  Patient has a sitter in the room with him due to him continuing to get up and down off the bed.  Patient states his wife goes to [place] program 3 times a week which helps give him a break.  Patient stated he drinks but did not specify how much he drinks. The patient does not seem to have any family visiting him. CSW discussed with patient about going to a skilled nursing facility for short term rehab, and he said he did not want to, he just wants to go back home to be with his wife.  CSW was not able to make any contact with patient's family, neither was the previous Education officer, museum.  Patient was calm and pleasant and relaxing on the bench by the window of the room.  PT is recommending SNF, but patient is saying no to SNF right now, but he is also slightly confused currently. CSW will reapproach patient once he is not as confused and ask about going to SNF again and attempt to get a hold of family members as well.   Assessment/plan  status:  Psychosocial Support/Ongoing Assessment of Needs Other assessment/ plan:   Information/referral to community resources:    PATIENT'S/FAMILY'S RESPONSE TO PLAN OF CARE: Patient said no about going to SNF, but he also seemed confused, will attempt to find out from family what their wishes are.   Jones Broom. [place], MSW, [place] 05/18/2014 4:46 PM

## 2014-05-18 NOTE — Progress Notes (Addendum)
PROGRESS NOTE  Miguel Hanson RXV:[id number] DOB: 1945/05/29 DOA: 05/08/2014 PCP: [facility]  HPI/Recap of past 24 hours: Much more Alert and pleasant, less confused. Off restrain, sitter in room,  Patient knows his own birthday, know he is in the hospital at [facility]. Know this is 2016, not knowing the month, Poor short term memory, poor insight.  Assessment/Plan: Principal Problem:   Expressive aphasia Active Problems:   Seizure disorder   Alcohol abuse   PTSD (post-traumatic stress disorder)   COPD (chronic obstructive pulmonary disease)   Altered mental status   HTN (hypertension), malignant   Prolonged Q-T interval on ECG   Chronic diastolic heart failure, NYHA class 1   Macrocytic anemia   Confusion   Encephalopathy acute   Alcohol withdrawal delirium  Expressive aphasia/altered mental status -was admitted to hospitalist service initially, transferred to icu due to worsening mental status and received precedex drip, transferred to hospitalist service on 3/17 after weaning off precedex -per neurology AMS likely related to Wernicke's encephalopathy/Korsakoff syndrome - Ct head/MRI/MRA brain negative -2-D echocardiogram, no acute findings -improving ,talkin in full sentence today on 3/17    Seizure disorder -Had focal myoclonic jerking activity on presentation, resolved -EEG with left hemispheric slowing and triphasic waves -Continue Keppra  Per neurology recommendation   Alcohol abuse -Patient unable to quantify amount of alcohol he drinks daily but during previous admissions he is admitted to drinking the "one of a pint of brandy every 2 days; likely more -Family reported he had increased his alcohol intake -on CIWA protocol, on librium, prn ativan, in restrain for fall precaution/sitting in room, taper off librium. On clonidine for now   COPD  -Compensated without current wheezing   HTN , malignant -Current blood pressure controlled -on clonidine   For now -start lopressor, titrate dose   Prolonged Q-T interval on ECG -Current QTC greater than 500 ms -Previous EKGs have averaged QTC readings between 475 ms and 507 ms - taper Seroquel from 3/18, monitor QTc   PTSD    Chronic diastolic heart failure, NYHA class 1 -Appears compensated at the present time   Macrocytic anemia/thrombocytopenia - TSH wnl -Hemoglobin stable at baseline of around 12 -Current platelets 99,000 and during last admission ranged between 102,000 and 137,000  Hypokalemia/hypomagnesimia: replace k/mag  DVT Prophylaxis: SCDs  Family Communication: No family at bedside  Code Status: Full code  Condition: Stable  Disposition Plan: pending Education officer, museum and Transport planner assistance, likely SNF   Consultants:  Neurology  pccm  Procedures:  EEG  Antibiotics:  None   Objective: BP 160/108 mmHg  Pulse 105  Temp(Src) 97.8 F (36.6 C) (Oral)  Resp 17  Ht 5\' 6"  (1.676 m)  Wt 54.8 kg (120 lb 13 oz)  BMI 19.51 kg/m2  SpO2 99%  Intake/Output Summary (Last 24 hours) at 05/18/14 1243 Last data filed at 05/18/14 0944  Gross per 24 hour  Intake    600 ml  Output    200 ml  Net    400 ml   Filed Weights   05/08/14 0837 05/08/14 1956 05/15/14 2000  Weight: 54.432 kg (120 lb) 50 kg (110 lb 3.7 oz) 54.8 kg (120 lb 13 oz)    Exam:   General:  Less Confused, cooperative and following commend  Cardiovascular: sinus tachycardia  Respiratory: CATBL  Abdomen: soft/NT/ND, positive bowel sounds  Musculoskeletal: no edema, moving all extremities  Data Reviewed: Basic Metabolic Panel:  Recent Labs Lab 05/13/14 0345 05/13/14 2048 05/14/14  7001 05/15/14 0215 05/16/14 0314 05/17/14 0628 05/18/14 0424  NA 135  --  140 141 138 139 137  K 3.2*  --  4.4 3.4* 4.0 3.5 3.3*  CL 109  --  110 112 110 106 101  CO2 21  --  22 21 23 21 27   GLUCOSE 92  --  100* 80 81 87 85  BUN <5*  --  <5* 5* <5* <5* <5*  CREATININE 0.49*  --  0.47*  0.55 0.58 0.57 0.54  CALCIUM 8.4  --  9.1 8.6 8.8 9.2 9.5  MG 1.3* 2.2 2.0 1.5  --   --  1.5  PHOS 2.7  --   --  4.1  --   --   --    Liver Function Tests: No results for input(s): AST, ALT, ALKPHOS, BILITOT, PROT, ALBUMIN in the last 168 hours. No results for input(s): LIPASE, AMYLASE in the last 168 hours. No results for input(s): AMMONIA in the last 168 hours. CBC:  Recent Labs Lab 05/12/14 0320 05/15/14 0215 05/16/14 0314 05/18/14 0424  WBC 8.3 4.4 5.3 7.2  HGB 10.6* 10.8* 11.0* 11.6*  HCT 29.0* 29.4* 30.6* 32.1*  MCV 104.7* 102.8* 104.8* 103.2*  PLT 102* 124* 135* 143*   Cardiac Enzymes:   No results for input(s): CKTOTAL, CKMB, CKMBINDEX, TROPONINI in the last 168 hours. BNP (last 3 results) No results for input(s): BNP in the last 8760 hours.  ProBNP (last 3 results) No results for input(s): PROBNP in the last 8760 hours.  CBG: No results for input(s): GLUCAP in the last 168 hours.  Recent Results (from the past 240 hour(s))  MRSA PCR Screening     Status: None   Collection Time: 05/08/14  7:56 PM  Result Value Ref Range Status   MRSA by PCR NEGATIVE NEGATIVE Final    Comment:        The GeneXpert MRSA Assay (FDA approved for NASAL specimens only), is one component of a comprehensive MRSA colonization surveillance program. It is not intended to diagnose MRSA infection nor to guide or monitor treatment for MRSA infections.      Studies: No results found.  Scheduled Meds: . chlordiazePOXIDE  10 mg Oral TID  . clonazePAM  1 mg Oral QHS  . cloNIDine  0.2 mg Transdermal Weekly  . feeding supplement (ENSURE COMPLETE)  237 mL Oral BID BM  . folic acid  1 mg Oral Daily  . levETIRAcetam  500 mg Oral BID  . magnesium sulfate 1 - 4 g bolus IVPB  2 g Intravenous Once  . metoprolol tartrate  12.5 mg Oral BID  . potassium chloride  40 mEq Oral Once  . QUEtiapine  50 mg Oral QHS  . thiamine  100 mg Oral Daily    Continuous Infusions: . sodium chloride  Stopped (05/16/14 1200)       Ellaree Gear  [facility] Pager [phone]. If 7PM-7AM, please contact night-coverage at [email or web address], password [facility] 05/18/2014, 12:43 PM  LOS: 10 days

## 2014-05-18 NOTE — Clinical Social Work Placement (Addendum)
Clinical Social Work Department CLINICAL SOCIAL WORK PLACEMENT NOTE 05/18/2014  Patient:  Miguel Hanson, Miguel Hanson  Account Number:  [id number] Admit date:  05/08/2014  Clinical Social Worker:  ERIC ANTERHAUS, LCSWA  Date/time:  05/18/2014 04:47 PM  Clinical Social Work is seeking post-discharge placement for this patient at the following level of care:   SKILLED NURSING   (*CSW will update this form in Epic as items are completed)   05/18/2014  Patient/family provided with [place] Department of Clinical Social Work's list of facilities offering this level of care within the geographic area requested by the patient (or if unable, by the patient's family).  05/18/2014  Patient/family informed of their freedom to choose among providers that offer the needed level of care, that participate in Medicare, Medicaid or managed care program needed by the patient, have an available bed and are willing to accept the patient.  05/18/2014  Patient/family informed of [facility]' ownership interest in [facility], as well as of the fact that they are under no obligation to receive care at this facility.  PASARR submitted to EDS on 05/18/2014 PASARR number received on   FL2 transmitted to all facilities in geographic area requested by pt/family on  05/18/2014 FL2 transmitted to all facilities within larger geographic area on 05/18/2014  Patient informed that his/her managed care company has contracts with or will negotiate with  certain facilities, including the following:     Patient/family informed of bed offers received:  05/19/2014 Patient chooses bed at [facility] Physician recommends and patient chooses bed at    Patient to be transferred to  on   Patient to be transferred to facility by  Patient and family notified of transfer on  Name of family member notified:    The following physician request were entered in Epic:   Additional Comments:  Eric  R. [place], MSW, [place] 05/18/2014 4:48 PM

## 2014-05-19 LAB — BASIC METABOLIC PANEL
ANION GAP: 7 (ref 5–15)
BUN: 7 mg/dL (ref 6–23)
CHLORIDE: 102 mmol/L (ref 96–112)
CO2: 28 mmol/L (ref 19–32)
Calcium: 9 mg/dL (ref 8.4–10.5)
Creatinine, Ser: 0.63 mg/dL (ref 0.50–1.35)
GFR calc Af Amer: >90 mL/min (ref >90)
Glucose, Bld: 93 mg/dL (ref 70–99)
Potassium: 4 mmol/L (ref 3.5–5.1)
Sodium: 137 mmol/L (ref 135–145)

## 2014-05-19 LAB — MAGNESIUM: MAGNESIUM: 1.9 mg/dL (ref 1.5–2.5)

## 2014-05-19 MED ORDER — CHLORDIAZEPOXIDE HCL 5 MG PO CAPS
5.0000 mg | ORAL_CAPSULE | Freq: Three times a day (TID) | ORAL | Status: DC
  Administered 2014-05-19 – 2014-05-20 (×3): 5 mg via ORAL
  Filled 2014-05-19 (×3): qty 1

## 2014-05-19 MED ORDER — ENOXAPARIN SODIUM 40 MG/0.4ML ~~LOC~~ SOLN
40.0000 mg | SUBCUTANEOUS | Status: DC
  Administered 2014-05-19 – 2014-05-20 (×2): 40 mg via SUBCUTANEOUS
  Filled 2014-05-19: qty 0.4

## 2014-05-19 MED ORDER — QUETIAPINE FUMARATE 50 MG PO TABS
25.0000 mg | ORAL_TABLET | Freq: Every day | ORAL | Status: DC
  Filled 2014-05-19: qty 1

## 2014-05-19 NOTE — Progress Notes (Signed)
No sitter this am at beginning of shift. Pt has remained calm, cooperative this shift, OOB since early am with no attempts to get out of chair. Family at bedside at present time.

## 2014-05-19 NOTE — Evaluation (Signed)
Clinical/Bedside Swallow Evaluation Patient Details  Name: Miguel Hanson MRN: [id number] Date of Birth: 1945-11-03  Today's Date: 05/19/2014 Time: SLP Start Time (ACUTE ONLY): 1410 SLP Stop Time (ACUTE ONLY): 1420 SLP Time Calculation (min) (ACUTE ONLY): 10 min  Past Medical History:  Past Medical History  Diagnosis Date  . Hypertension   . Seizures   . ETOH abuse   . Drug abuse   . PTSD (post-traumatic stress disorder)   . H/O prolonged Q-T interval on ECG   . Polysubstance abuse   . Alcohol abuse   . Depression    Past Surgical History:  Past Surgical History  Procedure Laterality Date  . No past surgeries     HPI:  69 y.o. male brought in with confusion, right sided weakness (resolved), witnessed myoclonic jerking of his facial muscles on the right, right arm and right leg in the ambulance that lasted over 5 minutes, trouble speaking. CT brain unremarkable for acute abnormality. MRI brain without acute abnormality. EEG with focal slowing in the hemisphere region on the left and intermittent sharp activity of triphasic morphology, most notable in the left occipital region.   Assessment / Plan / Recommendation Clinical Impression  Pt demonstrates normal swallow function. No evidence of aspiration observed. Pt is missing dentition and prefers soft foods, but states he is also able to eat salads and chicken. Will upgrade to regular diet and thin liquids to offer more choices with diet. No SLP f/u needed. (Of note, pt was evaluate for aphasia during this admission, but orders were cancelled. Pt is currently communicating functionally, will not pursue f/u SLP treatement).     Aspiration Risk  Mild    Diet Recommendation Regular;Thin liquid   Liquid Administration via: Cup;Straw Medication Administration: Whole meds with liquid Supervision: Patient able to self feed Postural Changes and/or Swallow Maneuvers: Seated upright 90 degrees    Other  Recommendations Oral Care  Recommendations: Oral care BID   Follow Up Recommendations  None    Frequency and Duration        Pertinent Vitals/Pain NA    SLP Swallow Goals     Swallow Study Prior Functional Status       General HPI: 69 y.o. male brought in with confusion, right sided weakness (resolved), witnessed myoclonic jerking of his facial muscles on the right, right arm and right leg in the ambulance that lasted over 5 minutes, trouble speaking. CT brain unremarkable for acute abnormality. MRI brain without acute abnormality. EEG with focal slowing in the hemisphere region on the left and intermittent sharp activity of triphasic morphology, most notable in the left occipital region. Type of Study: Bedside swallow evaluation Previous Swallow Assessment: none Diet Prior to this Study:  (SOFT/thin) Temperature Spikes Noted: Yes Respiratory Status: Room air History of Recent Intubation: No Behavior/Cognition: Alert;Cooperative;Pleasant mood Oral Cavity - Dentition: Missing dentition;Poor condition;Edentulous Self-Feeding Abilities: Able to feed self Patient Positioning: Upright in chair Baseline Vocal Quality: Clear Volitional Cough: Strong Volitional Swallow: Able to elicit    Oral/Motor/Sensory Function Overall Oral Motor/Sensory Function: Appears within functional limits for tasks assessed   Ice Chips Ice chips: Not tested   Thin Liquid Thin Liquid: Within functional limits    Nectar Thick Nectar Thick Liquid: Not tested   Honey Thick Honey Thick Liquid: Not tested   Puree Puree: Within functional limits   Solid   GO    Solid: Within functional limits      Herbie [place], MA CCC-SLP [phone]  Lynann Beaver 05/19/2014,3:02  PM

## 2014-05-19 NOTE — Clinical Social Work Note (Signed)
CSW received phone call from patient's wife and daughter in law Lattie Haw) [phone]. Per Lattie Haw, family has decided to accept bed offer with [facility] as the facility is closer to their homes as they will be transporting patient's wife to facility. Patient's wife and Lattie Haw state, wife is unable to visit patient due to transportation and being in a wheelchair. Wife states she has attempted to speak with patient's RN or MD for medical updates, but has been unsuccessful. Wife is requesting RN or MD contact her to make her aware of patient's current status and any updates. CSW acknowledged wife's concern and will make MD and RN aware. CSW to contact [facility] and [facility] and make the facilities aware of wife's decision. CSW to continue to follow.  [place], [place] Weekend Clinical Social Worker [phone]

## 2014-05-19 NOTE — Progress Notes (Signed)
PROGRESS NOTE  Miguel Hanson BPZ:[id number] DOB: [birth year] DOA: 05/08/2014 PCP: [facility]  HPI/Recap of past 24 hours: Off restrain, no sitter needed today,  Showed continued improvement,  Alert /pleasant, less confused. Family states mental status still not baseline yet. Poor short term memory, poor insight.  Assessment/Plan: Principal Problem:   Expressive aphasia Active Problems:   Seizure disorder   Alcohol abuse   PTSD (post-traumatic stress disorder)   COPD (chronic obstructive pulmonary disease)   Altered mental status   HTN (hypertension), malignant   Prolonged Q-T interval on ECG   Chronic diastolic heart failure, NYHA class 1   Macrocytic anemia   Confusion   Encephalopathy acute   Alcohol withdrawal delirium  Expressive aphasia/altered mental status -admitted to hospitalist service initially, transferred to icu due to worsening mental status and received precedex drip, transferred to hospitalist service on 3/17 after weaning off precedex -per neurology AMS likely related to Wernicke's encephalopathy/Korsakoff syndrome - Ct head/MRI/MRA brain negative -2-D echocardiogram, no acute findings -improving ,talking in full sentence since 3/17    Seizure disorder -Had focal myoclonic jerking activity on presentation, resolved -EEG with left hemispheric slowing and triphasic waves -Continue Keppra  Per neurology recommendation   Alcohol abuse -Patient unable to quantify amount of alcohol he drinks daily but during previous admissions he is admitted to drinking the "one of a pint of brandy every 2 days; likely more -Family reported he had increased his alcohol intake -showed steady improvement,  D/c clonidine qwk, taper librium/seroquel on 3/19.   COPD  -Compensated without current wheezing, lung clear   HTN , malignant -Current blood pressure controlled -on clonidine  stopped -started lopressor, titrate dose   Prolonged Q-T interval on  ECG - QTC greater than 500 ms initially during hospitalization  -Previous EKGs have averaged QTC readings between 475 ms and 507 ms - taper Seroquel from 3/18, monitor QTc -QTC 419ms on 3/19am   PTSD    Chronic diastolic heart failure, NYHA class 1 -Appears compensated at the present time, no edema, no sob, on room air   Macrocytic anemia/thrombocytopenia - TSH wnl -Hemoglobin stable at baseline of around 12 -Current platelets 99,000 and during last admission ranged between 102,000 and 137,000  Hypokalemia/hypomagnesimia: replace k/mag  generalized weakness: PT/SNF  DVT Prophylaxis: SCDs  Family Communication: talked to wife over the phone, talked to multiple family member in room on 3/19  Code Status: Full code  Condition: Stable  Disposition Plan: SNF   Consultants:  Neurology  pccm  Procedures:  EEG  Antibiotics:  None   Objective: BP 125/81 mmHg  Pulse 93  Temp(Src) 98.5 F (36.9 C) (Oral)  Resp 18  Ht 5\' 6"  (1.676 m)  Wt 54.8 kg (120 lb 13 oz)  BMI 19.51 kg/m2  SpO2 96%  Intake/Output Summary (Last 24 hours) at 05/19/14 1630 Last data filed at 05/19/14 1300  Gross per 24 hour  Intake    420 ml  Output    300 ml  Net    120 ml   Filed Weights   05/08/14 0837 05/08/14 1956 05/15/14 2000  Weight: 54.432 kg (120 lb) 50 kg (110 lb 3.7 oz) 54.8 kg (120 lb 13 oz)    Exam:   General:  Less Confused, cooperative and following commend  Cardiovascular: sinus tachycardia resolved, now NSR.  Respiratory: CATBL  Abdomen: soft/NT/ND, positive bowel sounds  Musculoskeletal: no edema, moving all extremities  Data Reviewed: Basic Metabolic Panel:  Recent Labs Lab 05/13/14 0345  05/13/14 2048 05/14/14 0343 05/15/14 0215 05/16/14 0314 05/17/14 0628 05/18/14 0424 05/19/14 0417  NA 135  --  140 141 138 139 137 137  K 3.2*  --  4.4 3.4* 4.0 3.5 3.3* 4.0  CL 109  --  110 112 110 106 101 102  CO2 21  --  22 21 23 21 27 28   GLUCOSE  92  --  100* 80 81 87 85 93  BUN <5*  --  <5* 5* <5* <5* <5* 7  CREATININE 0.49*  --  0.47* 0.55 0.58 0.57 0.54 0.63  CALCIUM 8.4  --  9.1 8.6 8.8 9.2 9.5 9.0  MG 1.3* 2.2 2.0 1.5  --   --  1.5 1.9  PHOS 2.7  --   --  4.1  --   --   --   --    Liver Function Tests: No results for input(s): AST, ALT, ALKPHOS, BILITOT, PROT, ALBUMIN in the last 168 hours. No results for input(s): LIPASE, AMYLASE in the last 168 hours. No results for input(s): AMMONIA in the last 168 hours. CBC:  Recent Labs Lab 05/15/14 0215 05/16/14 0314 05/18/14 0424  WBC 4.4 5.3 7.2  HGB 10.8* 11.0* 11.6*  HCT 29.4* 30.6* 32.1*  MCV 102.8* 104.8* 103.2*  PLT 124* 135* 143*   Cardiac Enzymes:   No results for input(s): CKTOTAL, CKMB, CKMBINDEX, TROPONINI in the last 168 hours. BNP (last 3 results) No results for input(s): BNP in the last 8760 hours.  ProBNP (last 3 results) No results for input(s): PROBNP in the last 8760 hours.  CBG: No results for input(s): GLUCAP in the last 168 hours.  No results found for this or any previous visit (from the past 240 hour(s)).   Studies: No results found.  Scheduled Meds: . chlordiazePOXIDE  5 mg Oral TID  . clonazePAM  1 mg Oral QHS  . feeding supplement (ENSURE COMPLETE)  237 mL Oral BID BM  . folic acid  1 mg Oral Daily  . levETIRAcetam  500 mg Oral BID  . metoprolol tartrate  12.5 mg Oral BID  . QUEtiapine  25 mg Oral QHS  . thiamine  100 mg Oral Daily    Continuous Infusions: . sodium chloride Stopped (05/16/14 1200)       Makhia Vosler  [facility] Pager [phone]. If 7PM-7AM, please contact night-coverage at [email or web address], password [facility] 05/19/2014, 4:30 PM  LOS: 11 days

## 2014-05-19 NOTE — Clinical Social Work Note (Signed)
CSW met with patient who continues to present with some confusion. Patient did express concern with returning home as wife is in [place] program and in wheelchair. Patient states he takes care of his wife when he is at home, but also has a nephew living with them that assists with caring for wife as well. Per patient, nephew works at [employer] and some family are assisting with caring for wife while nephew is at work. CSW acknowledged patient's concern for wife's safety at home and barrier to placement. CSW asked patient if he would consider SNF if wife had appropriate support and was agreeable with him being placed in a SNF. Patient states he would be agreeable, but CSW would have to speak with his wife regarding SNF placement as he states wife will "make that decision, so it's best for you to speak with her." CSW contacted patient's wife [name]) who is agreeable with patient receiving ST rehab at a SNF. Wife states nephew helps her during the day and she has "children who are in and out, checking on me while my nephew is at work." Patient's wife states she is not concerned about her safety because she has the support she needs at home. Wife insists she is more concerned with patient going to a SNF. CSW provided wife with current bed offers. Wife accepts bed offer with [facility]. CSW answered all wife's questions appropriately. CSW contacted facility and made them aware. Patient PASARR still in review. CSW to continue to follow.  [place], [place] Weekend Clinical Social Worker [phone]

## 2014-05-19 NOTE — Progress Notes (Signed)
  Sitter arrived to room to sit with pt.Miguel Hanson Dr. Erlinda Hong at text to see if she wanted to DC sitter.

## 2014-05-20 DIAGNOSIS — D539 Nutritional anemia, unspecified: Secondary | ICD-10-CM

## 2014-05-20 DIAGNOSIS — R627 Adult failure to thrive: Secondary | ICD-10-CM

## 2014-05-20 LAB — BASIC METABOLIC PANEL
Anion gap: 8 (ref 5–15)
BUN: 13 mg/dL (ref 6–23)
CALCIUM: 9.1 mg/dL (ref 8.4–10.5)
CHLORIDE: 98 mmol/L (ref 96–112)
CO2: 28 mmol/L (ref 19–32)
CREATININE: 0.88 mg/dL (ref 0.50–1.35)
GFR calc non Af Amer: 86 mL/min — ABNORMAL LOW (ref >90)
Glucose, Bld: 90 mg/dL (ref 70–99)
POTASSIUM: 4.3 mmol/L (ref 3.5–5.1)
Sodium: 134 mmol/L — ABNORMAL LOW (ref 135–145)

## 2014-05-20 LAB — MAGNESIUM: Magnesium: 1.8 mg/dL (ref 1.5–2.5)

## 2014-05-20 MED ORDER — CHLORDIAZEPOXIDE HCL 5 MG PO CAPS
5.0000 mg | ORAL_CAPSULE | Freq: Two times a day (BID) | ORAL | Status: DC
  Administered 2014-05-20 – 2014-05-21 (×2): 5 mg via ORAL
  Filled 2014-05-20 (×2): qty 1

## 2014-05-20 NOTE — Progress Notes (Signed)
PROGRESS NOTE  Miguel Hanson CVE:[id number] DOB: [birth year] DOA: 05/08/2014 PCP: [facility]  HPI/Recap of past 24 hours: No agitation, still not oriented to time, unsteady, Alert /pleasant.  Poor short term memory, poor insight.  Assessment/Plan: Principal Problem:   Expressive aphasia Active Problems:   Seizure disorder   Alcohol abuse   PTSD (post-traumatic stress disorder)   COPD (chronic obstructive pulmonary disease)   Altered mental status   HTN (hypertension), malignant   Prolonged Q-T interval on ECG   Chronic diastolic heart failure, NYHA class 1   Macrocytic anemia   Confusion   Encephalopathy acute   Alcohol withdrawal delirium  Expressive aphasia/altered mental status -admitted to hospitalist service initially, transferred to icu due to worsening mental status and received precedex drip, transferred to hospitalist service on 3/17 after weaning off precedex -per neurology AMS likely related to Wernicke's encephalopathy/Korsakoff syndrome - Ct head/MRI/MRA brain negative -2-D echocardiogram, no acute findings -improving ,talking in full sentence since 3/17    Seizure disorder -Had focal myoclonic jerking activity on presentation, resolved -EEG with left hemispheric slowing and triphasic waves -Continue Keppra  Per neurology recommendation   Alcohol abuse -Patient unable to quantify amount of alcohol he drinks daily but during previous admissions he is admitted to drinking the "one of a pint of brandy every 2 days; likely more -Family reported he had increased his alcohol intake -showed steady improvement,  D/c clonidine qwk, taper librium/seroquel from 3/19.   COPD  -Compensated without current wheezing, lung clear   HTN , malignant -Current blood pressure controlled -on clonidine  stopped -started lopressor, titrate dose   Prolonged Q-T interval on ECG - QTC greater than 500 ms initially during hospitalization  -Previous EKGs have  averaged QTC readings between 475 ms and 507 ms - taper Seroquel from 3/18, monitor QTc -QTC 417ms on 3/19am   PTSD    Chronic diastolic heart failure, NYHA class 1 -Appears compensated at the present time, no edema, no sob, on room air   Macrocytic anemia/thrombocytopenia - TSH wnl -Hemoglobin stable at baseline of around 12 -Current platelets 99,000 and during last admission ranged between 102,000 and 137,000  Hypokalemia/hypomagnesimia: replace k/mag  generalized weakness: fall precaution/PT/SNF  DVT Prophylaxis: lovenox  Family Communication: talked to wife over the phone, talked to multiple family member in room on 3/19  Code Status: Full code  Condition: Stable  Disposition Plan: SNF   Consultants:  Neurology  pccm  Procedures:  EEG  Antibiotics:  None   Objective: BP 110/82 mmHg  Pulse 95  Temp(Src) 98 F (36.7 C) (Oral)  Resp 18  Ht 5\' 6"  (1.676 m)  Wt 54.8 kg (120 lb 13 oz)  BMI 19.51 kg/m2  SpO2 94%  Intake/Output Summary (Last 24 hours) at 05/20/14 1448 Last data filed at 05/20/14 1344  Gross per 24 hour  Intake    960 ml  Output    450 ml  Net    510 ml   Filed Weights   05/08/14 0837 05/08/14 1956 05/15/14 2000  Weight: 54.432 kg (120 lb) 50 kg (110 lb 3.7 oz) 54.8 kg (120 lb 13 oz)    Exam:   General:  Less Confused, cooperative and following commend  Cardiovascular: sinus tachycardia resolved, now NSR.  Respiratory: CATBL  Abdomen: soft/NT/ND, positive bowel sounds  Musculoskeletal: no edema, moving all extremities  Data Reviewed: Basic Metabolic Panel:  Recent Labs Lab 05/14/14 0343 05/15/14 0215 05/16/14 0258 05/17/14 5277 05/18/14 0424 05/19/14 0417 05/20/14  0406  NA 140 141 138 139 137 137 134*  K 4.4 3.4* 4.0 3.5 3.3* 4.0 4.3  CL 110 112 110 106 101 102 98  CO2 22 21 23 21 27 28 28   GLUCOSE 100* 80 81 87 85 93 90  BUN <5* 5* <5* <5* <5* 7 13  CREATININE 0.47* 0.55 0.58 0.57 0.54 0.63 0.88    CALCIUM 9.1 8.6 8.8 9.2 9.5 9.0 9.1  MG 2.0 1.5  --   --  1.5 1.9 1.8  PHOS  --  4.1  --   --   --   --   --    Liver Function Tests: No results for input(s): AST, ALT, ALKPHOS, BILITOT, PROT, ALBUMIN in the last 168 hours. No results for input(s): LIPASE, AMYLASE in the last 168 hours. No results for input(s): AMMONIA in the last 168 hours. CBC:  Recent Labs Lab 05/15/14 0215 05/16/14 0314 05/18/14 0424  WBC 4.4 5.3 7.2  HGB 10.8* 11.0* 11.6*  HCT 29.4* 30.6* 32.1*  MCV 102.8* 104.8* 103.2*  PLT 124* 135* 143*   Cardiac Enzymes:   No results for input(s): CKTOTAL, CKMB, CKMBINDEX, TROPONINI in the last 168 hours. BNP (last 3 results) No results for input(s): BNP in the last 8760 hours.  ProBNP (last 3 results) No results for input(s): PROBNP in the last 8760 hours.  CBG: No results for input(s): GLUCAP in the last 168 hours.  No results found for this or any previous visit (from the past 240 hour(s)).   Studies: No results found.  Scheduled Meds: . chlordiazePOXIDE  5 mg Oral BID  . clonazePAM  1 mg Oral QHS  . enoxaparin (LOVENOX) injection  40 mg Subcutaneous Q24H  . feeding supplement (ENSURE COMPLETE)  237 mL Oral BID BM  . folic acid  1 mg Oral Daily  . levETIRAcetam  500 mg Oral BID  . metoprolol tartrate  12.5 mg Oral BID  . thiamine  100 mg Oral Daily    Continuous Infusions: . sodium chloride Stopped (05/16/14 1200)       Aldair Rickel  [facility] Pager [phone]. If 7PM-7AM, please contact night-coverage at [email or web address], password [facility] 05/20/2014, 2:48 PM  LOS: 12 days

## 2014-05-21 LAB — BASIC METABOLIC PANEL
Anion gap: 12 (ref 5–15)
BUN: 14 mg/dL (ref 6–23)
CALCIUM: 9.6 mg/dL (ref 8.4–10.5)
CO2: 27 mmol/L (ref 19–32)
Chloride: 96 mmol/L (ref 96–112)
Creatinine, Ser: 0.77 mg/dL (ref 0.50–1.35)
GFR calc Af Amer: >90 mL/min (ref >90)
Glucose, Bld: 96 mg/dL (ref 70–99)
POTASSIUM: 4.6 mmol/L (ref 3.5–5.1)
Sodium: 135 mmol/L (ref 135–145)

## 2014-05-21 LAB — MAGNESIUM: MAGNESIUM: 1.8 mg/dL (ref 1.5–2.5)

## 2014-05-21 MED ORDER — THIAMINE HCL 100 MG PO TABS: 100.0000 mg | ORAL_TABLET | Freq: Every day | ORAL | Status: DC

## 2014-05-21 MED ORDER — CLONAZEPAM 1 MG PO TABS: 1.0000 mg | ORAL_TABLET | Freq: Two times a day (BID) | ORAL | Status: DC | PRN

## 2014-05-21 MED ORDER — MAGNESIUM OXIDE 400 (241.3 MG) MG PO TABS
400.0000 mg | ORAL_TABLET | Freq: Every day | ORAL | Status: DC
  Administered 2014-05-21: 400 mg via ORAL
  Filled 2014-05-21: qty 1

## 2014-05-21 MED ORDER — MAGNESIUM OXIDE 400 (241.3 MG) MG PO TABS: 400.0000 mg | ORAL_TABLET | Freq: Every day | ORAL | Status: DC

## 2014-05-21 MED ORDER — MAGNESIUM SULFATE 2 GM/50ML IV SOLN
2.0000 g | Freq: Once | INTRAVENOUS | Status: DC
  Filled 2014-05-21: qty 50

## 2014-05-21 MED ORDER — FOLIC ACID 1 MG PO TABS: 1.0000 mg | ORAL_TABLET | Freq: Every day | ORAL | Status: DC

## 2014-05-21 MED ORDER — ENSURE COMPLETE PO LIQD: 237.0000 mL | Freq: Two times a day (BID) | ORAL | Status: DC

## 2014-05-21 MED ORDER — METOPROLOL TARTRATE 25 MG PO TABS: 12.5000 mg | ORAL_TABLET | Freq: Two times a day (BID) | ORAL | Status: DC

## 2014-05-21 MED ORDER — LEVETIRACETAM 500 MG PO TABS: 500.0000 mg | ORAL_TABLET | Freq: Two times a day (BID) | ORAL | Status: DC

## 2014-05-21 NOTE — Progress Notes (Addendum)
Discharge to [facility] and rehab, transported by [facility]. REport given to [place], [place]

## 2014-05-21 NOTE — Clinical Social Work Note (Addendum)
[facility] to confirm patient will be able to discharge to SNF today.  Spoke to Santiago Glad at [facility] who said they can take patient this afternoon around 2pm.  Patient and family made aware, CSW spoke to patient's nephew who will give the message to patient's wife.  Patient and family pleased with discharge patient to be transported via [facility].  Jones Broom. [place], MSW, [place] 05/21/2014 11:56 AM

## 2014-05-21 NOTE — Discharge Summary (Signed)
Discharge Summary  Miguel Hanson XLK:[id number] DOB: Dec 10, 1945  PCP: [facility] date: 05/08/2014 Discharge date: 05/21/2014  Time spent: >65mins  Recommendations for Outpatient Follow-up:  1. F/u with PMD at [facility] in two week  Discharge Diagnoses:  Active Hospital Problems   Diagnosis Date Noted  . Expressive aphasia 05/08/2014  . Encephalopathy acute   . Alcohol withdrawal delirium   . Confusion   . Chronic diastolic heart failure, NYHA class 1 05/08/2014  . Macrocytic anemia 05/08/2014  . Prolonged Q-T interval on ECG   . HTN (hypertension), malignant 12/16/2011  . Altered mental status 08/11/2011  . COPD (chronic obstructive pulmonary disease) 03/10/2011  . PTSD (post-traumatic stress disorder) 03/08/2011  . Alcohol abuse 03/08/2011  . Seizure disorder 03/08/2011    Resolved Hospital Problems   Diagnosis Date Noted Date Resolved  No resolved problems to display.    Discharge Condition: stable  Diet recommendation: heart healthy  Filed Weights   05/08/14 0837 05/08/14 1956 05/15/14 2000  Weight: 54.432 kg (120 lb) 50 kg (110 lb 3.7 oz) 54.8 kg (120 lb 13 oz)    History of present illness:  Miguel Hanson is a 69 y.o. male, with known history of ongoing alcohol abuse as well as occasional use of marijuana, he also has hypertension and seizure disorder (most likely related to alcohol withdrawal), recent admission for hepatic encephalopathy as well as ongoing issues related to depression and PTSD. He was sent to the ER by his family via EMS after they noticed he was confused with garbled speech and focal seizure-like activity on the right. Upon arrival of EMS to the scene they noticed he was having difficulty speaking consistant with expressive aphasia and appeared to have some right arm/leg weakness as well as witnessed myoclonic jerking of his facial muscles. Also he developed myoclonic jerking of the right arm and right leg in the ambulance which  lasted over 5 minutes. He was given Versed and this resolved. He had no difficulty following commands with the left side prior to receipt of Versed. According to family members who were questioned at the scene by the paramedics, the patient had apparently increased his alcohol use recently and last consumed alcohol yesterday evening. Patient is on Keppra at home; the doses listed as 1000 mg but pharmacy medication reconciliation reports patient only takes 500 mg twice a day. Because of expressive aphasia it has been quite difficult to obtain history from this patient and the family has not been at the bedside since arrival.  Upon arrival to the ER he has been evaluated by the emergency room physician as well as by neurology. CT of the head was without any acute abnormality. Alcohol level was less than 5, platelets were 99,000, ammonia level was normal. Urinalysis was pending.  Upon my evaluation of the patient again there is no family at the bedside and because of the patient's mentation he is unable to provide appropriate history. When I entered the room and spoke to him he seemed at first to be improving saying hello but then afterwards all questions asked of him resulted in answers that were inappropriate; his speech was garbled with incomprehensible words.  Hospital Course:  Principal Problem:   Expressive aphasia Active Problems:   Seizure disorder   Alcohol abuse   PTSD (post-traumatic stress disorder)   COPD (chronic obstructive pulmonary disease)   Altered mental status   HTN (hypertension), malignant   Prolonged Q-T interval on ECG   Chronic diastolic  heart failure, NYHA class 1   Macrocytic anemia   Confusion   Encephalopathy acute   Alcohol withdrawal delirium  Expressive aphasia/altered mental status -admitted to hospitalist service initially, transferred to icu due to worsening mental status and received precedex drip, transferred to hospitalist service on 3/17 after weaning off  precedex -per neurology AMS likely related to Wernicke's encephalopathy/Korsakoff syndrome - Ct head/MRI/MRA brain negative -2-D echocardiogram, no acute findings -improving ,talking in full sentence since 3/17 -AAOx3 at time of the discharge   Seizure disorder -Had focal myoclonic jerking activity on presentation, resolved -EEG with left hemispheric slowing and triphasic waves -Continue Keppra 500mg  bid Per neurology recommendation   Alcohol abuse -Patient unable to quantify amount of alcohol he drinks daily but during previous admissions he is admitted to drinking the "one of a pint of brandy every 2 days; likely more -Family reported he had increased his alcohol intake -showed steady improvement, D/c clonidine qwk, off  Librium/seroquel at time of discharge   COPD  -Compensated without current wheezing, lung clear   HTN , malignant -Current blood pressure controlled -on clonidine stopped -stable on lopressor 12.5bid   Prolonged Q-T interval on ECG - QTC greater than 500 ms initially during hospitalization  -Previous EKGs have averaged QTC readings between 475 ms and 507 ms - taper Seroquel from 3/18, monitor QTc -QTC 451ms on 3/19am   PTSD    Chronic diastolic heart failure, NYHA class 1 -Appears compensated at the present time, no edema, no sob, on room air   Macrocytic anemia/thrombocytopenia - TSH wnl -Hemoglobin stable at baseline of around 12 -Current platelets 99,000 and during last admission ranged between 102,000 and 137,000  Hypokalemia/hypomagnesimia: replace k/mag  generalized weakness: fall precaution/PT/SNF    Family Communication: talked to wife over the phone, talked to multiple family member in room on 3/19  Code Status: Full code  Condition: Stable  Disposition Plan: SNF   Consultants:  Neurology  pccm  Procedures:  EEG  Antibiotics:  None   Discharge Exam: BP 107/65 mmHg  Pulse 93  Temp(Src) 99.4 F (37.4  C) (Oral)  Resp 17  Ht 5\' 6"  (1.676 m)  Wt 54.8 kg (120 lb 13 oz)  BMI 19.51 kg/m2  SpO2 96%   General: cooperative and following commend, on 3/21 for the first time in the hospital patient is able to stated the correct date/month/year, still poor short term memory, immediate recall 1/3.  Cardiovascular: sinus tachycardia resolved, now NSR.  Respiratory: CATBL  Abdomen: soft/NT/ND, positive bowel sounds  Musculoskeletal: no edema, moving all extremities   Discharge Instructions You were cared for by a hospitalist during your hospital stay. If you have any questions about your discharge medications or the care you received while you were in the hospital after you are discharged, you can call the unit and asked to speak with the hospitalist on call if the hospitalist that took care of you is not available. Once you are discharged, your primary care physician will handle any further medical issues. Please note that NO REFILLS for any discharge medications will be authorized once you are discharged, as it is imperative that you return to your primary care physician (or establish a relationship with a primary care physician if you do not have one) for your aftercare needs so that they can reassess your need for medications and monitor your lab values.  Discharge Instructions    Diet - low sodium heart healthy    Complete by:  As directed  Increase activity slowly    Complete by:  As directed             Medication List    TAKE these medications        clonazePAM 1 MG tablet  Commonly known as:  KLONOPIN  Take 1 tablet (1 mg total) by mouth 2 (two) times daily as needed for anxiety.     folic acid 1 MG tablet  Commonly known as:  FOLVITE  Take 1 tablet (1 mg total) by mouth daily.     lactose free nutrition Liqd  Take 237 mLs by mouth 3 (three) times daily between meals.     feeding supplement (ENSURE COMPLETE) Liqd  Take 237 mLs by mouth 2 (two) times daily between  meals.     levETIRAcetam 500 MG tablet  Commonly known as:  KEPPRA  Take 1 tablet (500 mg total) by mouth 2 (two) times daily.     magnesium oxide 400 (241.3 MG) MG tablet  Commonly known as:  MAG-OX  Take 1 tablet (400 mg total) by mouth daily.     metoprolol tartrate 25 MG tablet  Commonly known as:  LOPRESSOR  Take 0.5 tablets (12.5 mg total) by mouth 2 (two) times daily.     thiamine 100 MG tablet  Take 1 tablet (100 mg total) by mouth daily.       No Known Allergies    The results of significant diagnostics from this hospitalization (including imaging, microbiology, ancillary and laboratory) are listed below for reference.    Significant Diagnostic Studies: Ct Head Wo Contrast  05/08/2014   CLINICAL DATA:  69 year old male with seizure-like activity on right side. History seizures. Slurred speech. Alcohol consumption last evening. History of drug use and alcohol abuse. Initial encounter.  EXAM: CT HEAD WITHOUT CONTRAST  TECHNIQUE: Contiguous axial images were obtained from the base of the skull through the vertex without intravenous contrast.  COMPARISON:  02/25/2014 brain MR and head CT.  FINDINGS: No intracranial hemorrhage.  Small vessel disease type changes without CT evidence of large acute infarct.  Global atrophy without hydrocephalus.  No intracranial mass lesion noted on this unenhanced exam.  Vascular calcifications.  Left sphenoid sinus chronic sinusitis type changes. Additionally air-fluid level raise possibility of acute sinusitis.  Complete opacification left frontal sinus without bony expansion. Appearance unchanged.  IMPRESSION: No intracranial hemorrhage.  Small vessel disease type changes without CT evidence of large acute infarct.  Global atrophy without hydrocephalus.  Left sphenoid sinus chronic sinusitis type changes. Additionally air-fluid level raise possibility of acute sinusitis.  Complete opacification left frontal sinus without bony expansion. Appearance  unchanged.   Electronically Signed   By: Genia Del [name]   On: 05/08/2014 10:00   Mr Jodene Nam Head Wo Contrast  05/08/2014   CLINICAL DATA:  Confusion. Alcohol abuse. Seizure disorder. Hypertension.  EXAM: MRI HEAD WITHOUT CONTRAST  MRA HEAD WITHOUT CONTRAST  TECHNIQUE: Multiplanar, multiecho pulse sequences of the brain and surrounding structures were obtained without intravenous contrast. Angiographic images of the head were obtained using MRA technique without contrast.  COMPARISON:  CT head 05/08/2014  FINDINGS: MRI HEAD FINDINGS  Moderate atrophy. Negative for hydrocephalus. Craniocervical junction normal. Pituitary normal in size.  Negative for acute infarct. Mild to moderate chronic microvascular ischemic change in the white matter. Mild chronic ischemia in the pons. No cortical infarct  Negative for intracranial hemorrhage.  Negative for mass or edema.  Temporal lobe atrophy. Medial temporal lobe is symmetric in volume  with symmetric normal signal.  MRA HEAD FINDINGS  Left vertebral artery widely patent to the basilar. Right vertebral artery ends in PICA. Left PICA not visualized. Basilar is widely patent. Superior cerebellar and posterior cerebral arteries are patent bilaterally without stenosis  Cavernous carotid widely patent bilaterally. Anterior and middle cerebral arteries patent. Mild stenosis of the parietal branch of the right middle cerebral artery.  Negative for cerebral aneurysm.  IMPRESSION: Moderate atrophy.  Mild to moderate chronic microvascular ischemia.  Negative for acute infarct or mass  Mild stenosis right middle cerebral artery branch. No flow limiting intracranial stenosis.   Electronically Signed   By: Franchot Gallo [name]   On: 05/08/2014 16:06   [name] Contrast  05/08/2014   CLINICAL DATA:  Confusion. Alcohol abuse. Seizure disorder. Hypertension.  EXAM: MRI HEAD WITHOUT CONTRAST  MRA HEAD WITHOUT CONTRAST  TECHNIQUE: Multiplanar, multiecho pulse sequences of the brain and  surrounding structures were obtained without intravenous contrast. Angiographic images of the head were obtained using MRA technique without contrast.  COMPARISON:  CT head 05/08/2014  FINDINGS: MRI HEAD FINDINGS  Moderate atrophy. Negative for hydrocephalus. Craniocervical junction normal. Pituitary normal in size.  Negative for acute infarct. Mild to moderate chronic microvascular ischemic change in the white matter. Mild chronic ischemia in the pons. No cortical infarct  Negative for intracranial hemorrhage.  Negative for mass or edema.  Temporal lobe atrophy. Medial temporal lobe is symmetric in volume with symmetric normal signal.  MRA HEAD FINDINGS  Left vertebral artery widely patent to the basilar. Right vertebral artery ends in PICA. Left PICA not visualized. Basilar is widely patent. Superior cerebellar and posterior cerebral arteries are patent bilaterally without stenosis  Cavernous carotid widely patent bilaterally. Anterior and middle cerebral arteries patent. Mild stenosis of the parietal branch of the right middle cerebral artery.  Negative for cerebral aneurysm.  IMPRESSION: Moderate atrophy.  Mild to moderate chronic microvascular ischemia.  Negative for acute infarct or mass  Mild stenosis right middle cerebral artery branch. No flow limiting intracranial stenosis.   Electronically Signed   By: Franchot Gallo [name]   On: 05/08/2014 16:06    Microbiology: No results found for this or any previous visit (from the past 240 hour(s)).   Labs: Basic Metabolic Panel:  Recent Labs Lab 05/15/14 0215  05/17/14 2836 05/18/14 0424 05/19/14 0417 05/20/14 0406 05/21/14 0505  NA 141  < > 139 137 137 134* 135  K 3.4*  < > 3.5 3.3* 4.0 4.3 4.6  CL 112  < > 106 101 102 98 96  CO2 21  < > 21 27 28 28 27   GLUCOSE 80  < > 87 85 93 90 96  BUN 5*  < > <5* <5* 7 13 14   CREATININE 0.55  < > 0.57 0.54 0.63 0.88 0.77  CALCIUM 8.6  < > 9.2 9.5 9.0 9.1 9.6  MG 1.5  --   --  1.5 1.9 1.8 1.8  PHOS 4.1   --   --   --   --   --   --   < > = values in this interval not displayed. Liver Function Tests: No results for input(s): AST, ALT, ALKPHOS, BILITOT, PROT, ALBUMIN in the last 168 hours. No results for input(s): LIPASE, AMYLASE in the last 168 hours. No results for input(s): AMMONIA in the last 168 hours. CBC:  Recent Labs Lab 05/15/14 0215 05/16/14 0314 05/18/14 0424  WBC 4.4 5.3 7.2  HGB 10.8* 11.0*  11.6*  HCT 29.4* 30.6* 32.1*  MCV 102.8* 104.8* 103.2*  PLT 124* 135* 143*   Cardiac Enzymes: No results for input(s): CKTOTAL, CKMB, CKMBINDEX, TROPONINI in the last 168 hours. BNP: BNP (last 3 results) No results for input(s): BNP in the last 8760 hours.  ProBNP (last 3 results) No results for input(s): PROBNP in the last 8760 hours.  CBG: No results for input(s): GLUCAP in the last 168 hours.     SignedFlorencia Reasons MD, PhD  [facility] 05/21/2014, 10:30 AM

## 2014-05-21 NOTE — Progress Notes (Signed)
Received patient asleep, not in any distress , on room air. Prior documentation O2 sat 82 %, recheck and O2 sat At 96 % on room air.

## 2014-07-04 ENCOUNTER — Encounter (HOSPITAL_COMMUNITY): Payer: Self-pay | Admitting: *Deleted

## 2014-07-04 ENCOUNTER — Inpatient Hospital Stay (HOSPITAL_COMMUNITY): Payer: Medicare Other

## 2014-07-04 ENCOUNTER — Emergency Department (HOSPITAL_COMMUNITY): Payer: Medicare Other

## 2014-07-04 ENCOUNTER — Inpatient Hospital Stay (HOSPITAL_COMMUNITY)
Admission: EM | Admit: 2014-07-04 | Discharge: 2014-07-11 | DRG: 100 | Disposition: A | Discharge status: Skilled Nursing Facility | Payer: Medicare Other | Attending: Internal Medicine | Admitting: Internal Medicine

## 2014-07-04 DIAGNOSIS — G40901 Epilepsy, unspecified, not intractable, with status epilepticus: Principal | ICD-10-CM | POA: Diagnosis present

## 2014-07-04 DIAGNOSIS — F4310 Post-traumatic stress disorder, unspecified: Secondary | ICD-10-CM | POA: Diagnosis present

## 2014-07-04 DIAGNOSIS — F1290 Cannabis use, unspecified, uncomplicated: Secondary | ICD-10-CM | POA: Diagnosis present

## 2014-07-04 DIAGNOSIS — I5032 Chronic diastolic (congestive) heart failure: Secondary | ICD-10-CM | POA: Diagnosis present

## 2014-07-04 DIAGNOSIS — J449 Chronic obstructive pulmonary disease, unspecified: Secondary | ICD-10-CM | POA: Diagnosis present

## 2014-07-04 DIAGNOSIS — F1210 Cannabis abuse, uncomplicated: Secondary | ICD-10-CM | POA: Diagnosis not present

## 2014-07-04 DIAGNOSIS — E43 Unspecified severe protein-calorie malnutrition: Secondary | ICD-10-CM | POA: Diagnosis present

## 2014-07-04 DIAGNOSIS — E512 Wernicke's encephalopathy: Secondary | ICD-10-CM | POA: Diagnosis present

## 2014-07-04 DIAGNOSIS — F1010 Alcohol abuse, uncomplicated: Secondary | ICD-10-CM | POA: Diagnosis present

## 2014-07-04 DIAGNOSIS — G319 Degenerative disease of nervous system, unspecified: Secondary | ICD-10-CM | POA: Diagnosis present

## 2014-07-04 DIAGNOSIS — Z6820 Body mass index (BMI) 20.0-20.9, adult: Secondary | ICD-10-CM | POA: Diagnosis not present

## 2014-07-04 DIAGNOSIS — G40909 Epilepsy, unspecified, not intractable, without status epilepticus: Secondary | ICD-10-CM | POA: Diagnosis not present

## 2014-07-04 DIAGNOSIS — G9340 Encephalopathy, unspecified: Secondary | ICD-10-CM | POA: Diagnosis present

## 2014-07-04 DIAGNOSIS — I4430 Unspecified atrioventricular block: Secondary | ICD-10-CM | POA: Diagnosis present

## 2014-07-04 DIAGNOSIS — R569 Unspecified convulsions: Secondary | ICD-10-CM | POA: Diagnosis not present

## 2014-07-04 DIAGNOSIS — I440 Atrioventricular block, first degree: Secondary | ICD-10-CM | POA: Diagnosis present

## 2014-07-04 DIAGNOSIS — Z8249 Family history of ischemic heart disease and other diseases of the circulatory system: Secondary | ICD-10-CM

## 2014-07-04 DIAGNOSIS — F329 Major depressive disorder, single episode, unspecified: Secondary | ICD-10-CM | POA: Diagnosis present

## 2014-07-04 DIAGNOSIS — F1910 Other psychoactive substance abuse, uncomplicated: Secondary | ICD-10-CM | POA: Diagnosis present

## 2014-07-04 DIAGNOSIS — R4182 Altered mental status, unspecified: Secondary | ICD-10-CM | POA: Diagnosis present

## 2014-07-04 DIAGNOSIS — F17210 Nicotine dependence, cigarettes, uncomplicated: Secondary | ICD-10-CM | POA: Diagnosis present

## 2014-07-04 DIAGNOSIS — R4701 Aphasia: Secondary | ICD-10-CM | POA: Diagnosis present

## 2014-07-04 DIAGNOSIS — F1020 Alcohol dependence, uncomplicated: Secondary | ICD-10-CM | POA: Diagnosis not present

## 2014-07-04 DIAGNOSIS — I4581 Long QT syndrome: Secondary | ICD-10-CM | POA: Diagnosis present

## 2014-07-04 DIAGNOSIS — I10 Essential (primary) hypertension: Secondary | ICD-10-CM | POA: Diagnosis present

## 2014-07-04 DIAGNOSIS — F10239 Alcohol dependence with withdrawal, unspecified: Secondary | ICD-10-CM | POA: Diagnosis present

## 2014-07-04 LAB — COMPREHENSIVE METABOLIC PANEL
ALK PHOS: 60 U/L (ref 38–126)
ALT: 8 U/L — AB (ref 17–63)
ANION GAP: 11 (ref 5–15)
AST: 17 U/L (ref 15–41)
Albumin: 3.3 g/dL — ABNORMAL LOW (ref 3.5–5.0)
BILIRUBIN TOTAL: 0.4 mg/dL (ref 0.3–1.2)
CHLORIDE: 105 mmol/L (ref 101–111)
CO2: 22 mmol/L (ref 22–32)
Calcium: 9.2 mg/dL (ref 8.9–10.3)
Creatinine, Ser: 0.76 mg/dL (ref 0.61–1.24)
GLUCOSE: 109 mg/dL — AB (ref 70–99)
POTASSIUM: 4.7 mmol/L (ref 3.5–5.1)
SODIUM: 138 mmol/L (ref 135–145)
Total Protein: 7.3 g/dL (ref 6.5–8.1)

## 2014-07-04 LAB — CBC WITH DIFFERENTIAL/PLATELET
Basophils Absolute: 0 10*3/uL (ref 0.0–0.1)
Basophils Relative: 1 % (ref 0–1)
Eosinophils Absolute: 0.4 10*3/uL (ref 0.0–0.7)
Eosinophils Relative: 7 % — ABNORMAL HIGH (ref 0–5)
HEMATOCRIT: 36.1 % — AB (ref 39.0–52.0)
Hemoglobin: 13 g/dL (ref 13.0–17.0)
LYMPHS ABS: 2.1 10*3/uL (ref 0.7–4.0)
LYMPHS PCT: 36 % (ref 12–46)
MCH: 32.8 pg (ref 26.0–34.0)
MCHC: 36 g/dL (ref 30.0–36.0)
MCV: 91.2 fL (ref 78.0–100.0)
Monocytes Absolute: 0.3 10*3/uL (ref 0.1–1.0)
Monocytes Relative: 5 % (ref 3–12)
Neutro Abs: 3 10*3/uL (ref 1.7–7.7)
Neutrophils Relative %: 51 % (ref 43–77)
Platelets: 199 10*3/uL (ref 150–400)
RBC: 3.96 MIL/uL — AB (ref 4.22–5.81)
RDW: 18.2 % — ABNORMAL HIGH (ref 11.5–15.5)
WBC: 5.9 10*3/uL (ref 4.0–10.5)

## 2014-07-04 LAB — URINALYSIS, ROUTINE W REFLEX MICROSCOPIC
Bilirubin Urine: NEGATIVE
Glucose, UA: NEGATIVE mg/dL
Hgb urine dipstick: NEGATIVE
Ketones, ur: NEGATIVE mg/dL
Leukocytes, UA: NEGATIVE
NITRITE: NEGATIVE
Protein, ur: NEGATIVE mg/dL
Specific Gravity, Urine: 1.013 (ref 1.005–1.030)
Urobilinogen, UA: 0.2 mg/dL (ref 0.0–1.0)
pH: 6.5 (ref 5.0–8.0)

## 2014-07-04 LAB — ETHANOL: Alcohol, Ethyl (B): <5 mg/dL (ref <5)

## 2014-07-04 LAB — RAPID URINE DRUG SCREEN, HOSP PERFORMED
Amphetamines: NOT DETECTED
BARBITURATES: NOT DETECTED
BENZODIAZEPINES: NOT DETECTED
Cocaine: NOT DETECTED
Opiates: NOT DETECTED
Tetrahydrocannabinol: POSITIVE — AB

## 2014-07-04 LAB — AMMONIA: Ammonia: 27 umol/L (ref 9–35)

## 2014-07-04 LAB — CBG MONITORING, ED: GLUCOSE-CAPILLARY: 95 mg/dL (ref 70–99)

## 2014-07-04 LAB — PROTIME-INR
INR: 1.19 (ref 0.00–1.49)
Prothrombin Time: 15.2 seconds (ref 11.6–15.2)

## 2014-07-04 LAB — MRSA PCR SCREENING: MRSA by PCR: NEGATIVE

## 2014-07-04 LAB — MAGNESIUM: Magnesium: 1.4 mg/dL — ABNORMAL LOW (ref 1.7–2.4)

## 2014-07-04 LAB — ACETAMINOPHEN LEVEL: Acetaminophen (Tylenol), Serum: <10 ug/mL — ABNORMAL LOW (ref 10–30)

## 2014-07-04 LAB — SALICYLATE LEVEL: Salicylate Lvl: <4 mg/dL (ref 2.8–30.0)

## 2014-07-04 MED ORDER — SODIUM CHLORIDE 0.9 % IJ SOLN
3.0000 mL | Freq: Two times a day (BID) | INTRAMUSCULAR | Status: DC
  Administered 2014-07-04 – 2014-07-10 (×10): 3 mL via INTRAVENOUS

## 2014-07-04 MED ORDER — SODIUM CHLORIDE 0.9 % IV SOLN: INTRAVENOUS | Status: DC

## 2014-07-04 MED ORDER — MAGNESIUM OXIDE 400 (241.3 MG) MG PO TABS
400.0000 mg | ORAL_TABLET | Freq: Every day | ORAL | Status: DC
  Administered 2014-07-05 – 2014-07-10 (×5): 400 mg via ORAL
  Filled 2014-07-04 (×6): qty 1

## 2014-07-04 MED ORDER — LORAZEPAM 2 MG/ML IJ SOLN: 1.0000 mg | INTRAMUSCULAR | Status: DC | PRN

## 2014-07-04 MED ORDER — THIAMINE HCL 100 MG/ML IJ SOLN: 100.0000 mg | Freq: Every day | INTRAMUSCULAR | Status: DC

## 2014-07-04 MED ORDER — THIAMINE HCL 100 MG/ML IJ SOLN
100.0000 mg | Freq: Every day | INTRAMUSCULAR | Status: DC
  Administered 2014-07-04: 100 mg via INTRAVENOUS
  Filled 2014-07-04 (×2): qty 1

## 2014-07-04 MED ORDER — LORAZEPAM 2 MG/ML IJ SOLN: 2.0000 mg | INTRAMUSCULAR | Status: DC | PRN

## 2014-07-04 MED ORDER — ENSURE ENLIVE PO LIQD
237.0000 mL | Freq: Two times a day (BID) | ORAL | Status: DC
  Administered 2014-07-05 – 2014-07-11 (×10): 237 mL via ORAL

## 2014-07-04 MED ORDER — SODIUM CHLORIDE 0.9 % IV SOLN
1000.0000 mg | Freq: Once | INTRAVENOUS | Status: AC
  Administered 2014-07-04: 1000 mg via INTRAVENOUS
  Filled 2014-07-04: qty 10

## 2014-07-04 MED ORDER — VITAMIN B-1 100 MG PO TABS
100.0000 mg | ORAL_TABLET | Freq: Every day | ORAL | Status: DC
  Filled 2014-07-04: qty 1

## 2014-07-04 MED ORDER — ENOXAPARIN SODIUM 40 MG/0.4ML ~~LOC~~ SOLN
40.0000 mg | SUBCUTANEOUS | Status: DC
  Administered 2014-07-04 – 2014-07-10 (×7): 40 mg via SUBCUTANEOUS
  Filled 2014-07-04 (×7): qty 0.4

## 2014-07-04 MED ORDER — LORAZEPAM 2 MG/ML IJ SOLN
0.0000 mg | Freq: Four times a day (QID) | INTRAMUSCULAR | Status: DC
Start: 2014-07-04 — End: 2014-07-04

## 2014-07-04 MED ORDER — CLONAZEPAM 1 MG PO TABS
1.0000 mg | ORAL_TABLET | Freq: Two times a day (BID) | ORAL | Status: DC | PRN
  Administered 2014-07-06: 1 mg via ORAL
  Filled 2014-07-04: qty 1

## 2014-07-04 MED ORDER — FOLIC ACID 1 MG PO TABS
1.0000 mg | ORAL_TABLET | Freq: Every day | ORAL | Status: DC
  Administered 2014-07-05: 1 mg via ORAL
  Filled 2014-07-04 (×2): qty 1

## 2014-07-04 MED ORDER — METOPROLOL TARTRATE 12.5 MG HALF TABLET
12.5000 mg | ORAL_TABLET | Freq: Two times a day (BID) | ORAL | Status: DC
  Administered 2014-07-05 – 2014-07-10 (×11): 12.5 mg via ORAL
  Filled 2014-07-04 (×13): qty 1

## 2014-07-04 MED ORDER — ENSURE COMPLETE PO LIQD: 237.0000 mL | Freq: Two times a day (BID) | ORAL | Status: DC

## 2014-07-04 MED ORDER — VITAMIN B-1 100 MG PO TABS
100.0000 mg | ORAL_TABLET | Freq: Every day | ORAL | Status: DC
  Filled 2014-07-04 (×2): qty 1

## 2014-07-04 MED ORDER — VITAMIN B-1 100 MG PO TABS: 100.0000 mg | ORAL_TABLET | Freq: Every day | ORAL | Status: DC

## 2014-07-04 MED ORDER — LORAZEPAM 2 MG/ML IJ SOLN
0.0000 mg | Freq: Two times a day (BID) | INTRAMUSCULAR | Status: DC
Start: 2014-07-04 — End: 2014-07-04

## 2014-07-04 MED ORDER — ADULT MULTIVITAMIN W/MINERALS CH
1.0000 | ORAL_TABLET | Freq: Every day | ORAL | Status: DC
  Administered 2014-07-05 – 2014-07-11 (×6): 1 via ORAL
  Filled 2014-07-04 (×7): qty 1

## 2014-07-04 MED ORDER — THIAMINE HCL 100 MG/ML IJ SOLN
Freq: Once | INTRAVENOUS | Status: AC
  Administered 2014-07-04: 17:00:00 via INTRAVENOUS
  Filled 2014-07-04: qty 1000

## 2014-07-04 MED ORDER — LORAZEPAM 1 MG PO TABS: 0.0000 mg | ORAL_TABLET | Freq: Two times a day (BID) | ORAL | Status: DC

## 2014-07-04 MED ORDER — LORAZEPAM 1 MG PO TABS: 0.0000 mg | ORAL_TABLET | Freq: Four times a day (QID) | ORAL | Status: DC

## 2014-07-04 MED ORDER — LEVETIRACETAM 500 MG PO TABS
1000.0000 mg | ORAL_TABLET | Freq: Two times a day (BID) | ORAL | Status: DC
  Filled 2014-07-04 (×2): qty 2

## 2014-07-04 NOTE — ED Notes (Signed)
No neuro deficits noted upon assessment prior to transport to [facility]M

## 2014-07-04 NOTE — ED Notes (Signed)
Spoke with Ron re: 58M bed assignment, pt to go to this particular floor d/t need for neuro checks, admitting service aware

## 2014-07-04 NOTE — ED Notes (Signed)
Patient transported to CT 

## 2014-07-04 NOTE — ED Notes (Signed)
Report given to Lauren on 2H, RN requesting clarification if the pt will require NIH checks, Reynolds, MD to eval pt prior to pt being transported upstairs, per MRI it is estimated to be 30-1 hr before scan can be completed, Janett Billow, RN aware

## 2014-07-04 NOTE — ED Notes (Signed)
Pt in from home via [facility], per report pt LSN unknown time last night, symptom noticed today  Upon awakening by wife estimated x 2 hrs ago, per EMS the pts wife states that he gets like this when his BP gets high while he is drinking, pts deficits upon EMS arrival was the pt was not lifting the R arm, upon arrival to ED pt lifts his R arm on command, pt follows commands, pt reported hx of seizure, GCS in route 12, upon arrival to ED GCS 14

## 2014-07-04 NOTE — Consult Note (Signed)
Referring Physician: Daryll Drown    Chief Complaint: AMS Hebert Soho  HPI:                                                                                                                                         Miguel Hanson is an 69 y.o. male with known ETOH abuse--reported to drink a pint of hard liquor a night. Pateint was recently seen in hospital in March of this year for similar presentation of AMS and right sided weakness which resolved.  On that admission he had 2 EEGs which demonstrated left hemispheric focal slowing and intermittent periodic sharp discharges of triphasic morphology in left occipital region.  No active seizures were captured.  He was kept on Keppra 500 mg daily.  His confusion never fully cleared with question of Wernicke's encephalopathy/korsakoff syndrome with superimposed agitated delirium.  MRI/MRA at that time was negative for acute stroke.   Patient returns to the hospital after family members noted he had right sided weakness and confusion.  He was last seen at some point last night per notes.  There is no family members in room to verify and patient is unable to verify. On arrival to ED his right sided weakness had resolved but patient remained confused. Patient is moving all extremities and continues to state "one, one" and speak in nonsensical speech.   Date last known well: Date: 07/03/2014 Time last known well: Unable to determine tPA Given: No: out of window Modified Rank[place]: unable to determine as family is not available.     Past Medical History  Diagnosis Date  . Hypertension   . Seizures   . ETOH abuse   . Drug abuse   . PTSD (post-traumatic stress disorder)   . H/O prolonged Q-T interval on ECG   . Polysubstance abuse   . Alcohol abuse   . Depression     Past Surgical History  Procedure Laterality Date  . No past surgeries      Family History  Problem Relation Age of Onset  . Hypertension Mother   . Hypertension Father    Social History:  reports  that he has been smoking Cigarettes.  He has a 24.5 pack-year smoking history. He uses smokeless tobacco. He reports that he drinks about 21.6 oz of alcohol per week. He reports that he uses illicit drugs (Marijuana).  Allergies: No Known Allergies  Medications:  Current Facility-Administered Medications  Medication Dose Route Frequency Provider Last Rate Last Dose  . LORazepam (ATIVAN) injection 0-4 mg  0-4 mg Intravenous 4 times per day Tanna Furry, MD      . LORazepam (ATIVAN) injection 0-4 mg  0-4 mg Intravenous Q12H Tanna Furry, MD      . LORazepam (ATIVAN) tablet 0-4 mg  0-4 mg Oral 4 times per day Tanna Furry, MD      . LORazepam (ATIVAN) tablet 0-4 mg  0-4 mg Oral Q12H Tanna Furry, MD      . thiamine (B-1) injection 100 mg  100 mg Intravenous Daily Tanna Furry, MD      . thiamine (VITAMIN B-1) tablet 100 mg  100 mg Oral Daily Tanna Furry, MD       Current Outpatient Prescriptions  Medication Sig Dispense Refill  . clonazePAM (KLONOPIN) 1 MG tablet Take 1 tablet (1 mg total) by mouth 2 (two) times daily as needed for anxiety. 15 tablet 0  . feeding supplement, ENSURE COMPLETE, (ENSURE COMPLETE) LIQD Take 237 mLs by mouth 2 (two) times daily between meals. 60 Bottle 3  . folic acid (FOLVITE) 1 MG tablet Take 1 tablet (1 mg total) by mouth daily. 30 tablet 3  . lactose free nutrition (BOOST) LIQD Take 237 mLs by mouth 3 (three) times daily between meals.    . levETIRAcetam (KEPPRA) 500 MG tablet Take 1 tablet (500 mg total) by mouth 2 (two) times daily. 60 tablet 3  . magnesium oxide (MAG-OX) 400 (241.3 MG) MG tablet Take 1 tablet (400 mg total) by mouth daily. 30 tablet 3  . metoprolol tartrate (LOPRESSOR) 25 MG tablet Take 0.5 tablets (12.5 mg total) by mouth 2 (two) times daily. 60 tablet 3  . thiamine 100 MG tablet Take 1 tablet (100 mg total) by mouth daily. 30 tablet 3      ROS:                                                                                                                                       History obtained from unobtainable from patient due to mental status   Neurologic Examination:                                                                                                      Blood pressure 158/97, pulse 98, temperature 98.4 F (36.9 C), temperature source Oral, resp. rate 17, SpO2 100 %.  HEENT-  Normocephalic, no lesions, without obvious  abnormality.  Normal external eye and conjunctiva.  Normal TM's bilaterally.  Normal auditory canals and external ears. Normal external nose, mucus membranes and septum.  Normal pharynx. Cardiovascular- S1, S2 normal, pulses palpable throughout   Lungs- chest clear, no wheezing, rales, normal symmetric air entry Abdomen- normal findings: bowel sounds normal Extremities- no edema Lymph-no adenopathy palpable Musculoskeletal-no joint tenderness, deformity or swelling Skin-warm and dry, no hyperpigmentation, vitiligo, or suspicious lesions  Neurological Examination Mental Status: Alert, not oriented, Patient continues to perseverate on the word "one" and when speaking he has fluent nonsensical speech at times.  At other times is able to provide appropriate and fluent speech.  Patient will intermittently follow visual commands. Will not follow verbal commands.  Cranial Nerves: II: Discs flat bilaterally; blinks to threat bilaterally, pupils equal, round, reactive to light and accommodation III,IV, VI: ptosis not present, extra-ocular motions intact bilaterally V,VII: smile symmetric, facial light touch sensation normal bilaterally VIII: hearing normal bilaterally IX,X: uvula rises symmetrically XI: bilateral shoulder shrug XII: midline tongue extension Motor: Moving all extremities antigravity with full strength.  Sensory: Does not withdraw from pain in all extremities.  Deep Tendon  Reflexes: 2+ and symmetric throughout UE 1+ bilateral KJ and no AJ Plantars: Mute bilaterally Cerebellar: No dysmetria noted when attampting finger-to-nose not able to obtain normal heel-to-shin test due to inability to follow command Gait: not able to be obtained.    Lab Results: Basic Metabolic Panel:  Recent Labs Lab 07/04/14 1016  NA 138  K 4.7  CL 105  CO2 22  GLUCOSE 109*  BUN <5*  CREATININE 0.76  CALCIUM 9.2    Liver Function Tests:  Recent Labs Lab 07/04/14 1016  AST 17  ALT 8*  ALKPHOS 60  BILITOT 0.4  PROT 7.3  ALBUMIN 3.3*   No results for input(s): LIPASE, AMYLASE in the last 168 hours.  Recent Labs Lab 07/04/14 1050  AMMONIA 27    CBC:  Recent Labs Lab 07/04/14 1016  WBC 5.9  NEUTROABS 3.0  HGB 13.0  HCT 36.1*  MCV 91.2  PLT 199    Cardiac Enzymes: No results for input(s): CKTOTAL, CKMB, CKMBINDEX, TROPONINI in the last 168 hours.  Lipid Panel: No results for input(s): CHOL, TRIG, HDL, CHOLHDL, VLDL, LDLCALC in the last 168 hours.  CBG:  Recent Labs Lab 07/04/14 1038  GLUCAP 95    Microbiology: Results for orders placed or performed during the hospital encounter of 05/08/14  MRSA PCR Screening     Status: None   Collection Time: 05/08/14  7:56 PM  Result Value Ref Range Status   MRSA by PCR NEGATIVE NEGATIVE Final    Comment:        The GeneXpert MRSA Assay (FDA approved for NASAL specimens only), is one component of a comprehensive MRSA colonization surveillance program. It is not intended to diagnose MRSA infection nor to guide or monitor treatment for MRSA infections.     Coagulation Studies:  Recent Labs  07/04/14 1016  LABPROT 15.2  INR 1.19    Imaging: Ct Head Wo Contrast  07/04/2014   CLINICAL DATA:  69 year old male with altered mental status, a not moving right upper extremity. Initial encounter.  EXAM: CT HEAD WITHOUT CONTRAST  TECHNIQUE: Contiguous axial images were obtained from the base of  the skull through the vertex without intravenous contrast.  COMPARISON:  Brain MRI 05/08/2014 and earlier.  FINDINGS: Chronic left sphenoid sinusitis. Stable left frontal sinus opacification. Visualized orbits and scalp soft tissues are  within normal limits. No acute osseous abnormality identified.  Dominant distal left vertebral artery. Calcified atherosclerosis at the skull base. Stable cerebral volume. No ventriculomegaly. No midline shift, mass effect, or evidence of intracranial mass lesion. Patchy and confluent white matter hypodensity appears stable. No evidence of cortically based acute infarction identified. No acute intracranial hemorrhage identified.  IMPRESSION: 1. No acute intracranial abnormality. Stable cerebral white matter changes. 2. Chronic left sphenoid and frontal sinusitis.   Electronically Signed   By: Genevie Ann [name]   On: 07/04/2014 11:48    Etta Quill PA-C [facility] [phone]  07/04/2014, 1:42 PM   Patient seen and examined.  Clinical course and management discussed.  Necessary edits performed.  I agree with the above.  Assessment and plan of care developed and discussed below.     Assessment: 69 y.o. male presenting with aphasia and right sided weakness.  Suspect patient has had an unwitnessed seizure and currently is presenting with a Todd's paraylsis.  Head CT personally reviewed and shows no acute changes.  MRI of the brain personally reviewed as well and there is no evidence of acute infarct.  Suspect etiology of ETOH.  Patient on Keppra.  Unclear compliance.    Stroke Risk Factors - hypertension, tobacco abuse.   Recommendations: 1.  Keppra 1000mg  IV now 2.  Increase maintenance Keppra to 1000mg  BID 3.  Seizure precautions 4.  CIWA protocol    Alexis Goodell, MD [facility] [phone]  07/04/2014  4:00 PM

## 2014-07-04 NOTE — ED Provider Notes (Signed)
CSN: [id number]     Arrival date & time 07/04/14  1020 History   First MD Initiated Contact with Patient 07/04/14 1024     Chief Complaint  Patient presents with  . Altered Mental Status      HPI  Miguel Hanson presents for evaluation of altered mental status.  Last time no normal was last night prior to bedtime unknown. This morning family states they went to his room. He was unable to use his right arm or speak other than saying "hello". He states he is a heavy user of alcohol. They do not know or did not report his last drink.  Transported by EMS as above.  Had an exact similar presentation in March where he ruled out for stroke and ended up in marked alcohol withdrawal.  No known recent trauma, stroke or TIA symptoms, fevers, chest pain shortness of breath or other complaints. He is only able to answer 1 word at a time.    Past Medical History  Diagnosis Date  . Hypertension   . Seizures   . ETOH abuse   . Drug abuse   . PTSD (post-traumatic stress disorder)   . H/O prolonged Q-T interval on ECG   . Polysubstance abuse   . Alcohol abuse   . Depression    Past Surgical History  Procedure Laterality Date  . No past surgeries     Family History  Problem Relation Age of Onset  . Hypertension Mother   . Hypertension Father    History  Substance Use Topics  . Smoking status: Current Some Day Smoker -- 0.50 packs/day for 49 years    Types: Cigarettes  . Smokeless tobacco: Current User  . Alcohol Use: 21.6 oz/week    36 Shots of liquor per week     Comment: 03/01/2013 "drink a pint of brandy q 2 days"    Review of Systems  Unable to perform ROS: Mental status change      Allergies  Review of patient's allergies indicates no known allergies.  Home Medications   Prior to Admission medications   Medication Sig Start Date End Date Taking? Authorizing Provider  feeding supplement, ENSURE COMPLETE, (ENSURE COMPLETE) LIQD Take 237 mLs by mouth 2 (two) times  daily between meals. 05/21/14  Yes Florencia Reasons, MD  folic acid (FOLVITE) 1 MG tablet Take 1 tablet (1 mg total) by mouth daily. 05/21/14  Yes Florencia Reasons, MD  lactose free nutrition (BOOST) LIQD Take 237 mLs by mouth 3 (three) times daily between meals.   Yes Historical Provider, MD  levETIRAcetam (KEPPRA) 500 MG tablet Take 1 tablet (500 mg total) by mouth 2 (two) times daily. 05/21/14  Yes Florencia Reasons, MD  lisinopril (PRINIVIL,ZESTRIL) 40 MG tablet Take 20 mg by mouth daily.   Yes Historical Provider, MD  metoprolol tartrate (LOPRESSOR) 25 MG tablet Take 0.5 tablets (12.5 mg total) by mouth 2 (two) times daily. 05/21/14  Yes Florencia Reasons, MD  phenytoin (DILANTIN) 300 MG ER capsule Take 300 mg by mouth 3 (three) times daily.   Yes Historical Provider, MD  sertraline (ZOLOFT) 100 MG tablet Take 200 mg by mouth daily.   Yes Historical Provider, MD  traZODone (DESYREL) 50 MG tablet Take 50 mg by mouth at bedtime.   Yes Historical Provider, MD  clonazePAM (KLONOPIN) 1 MG tablet Take 1 tablet (1 mg total) by mouth 2 (two) times daily as needed for anxiety. 05/21/14   Florencia Reasons, MD  magnesium oxide (MAG-OX) 400 (241.3  MG) MG tablet Take 1 tablet (400 mg total) by mouth daily. 05/21/14   Florencia Reasons, MD  thiamine 100 MG tablet Take 1 tablet (100 mg total) by mouth daily. 05/21/14   Florencia Reasons, MD   BP 143/91 mmHg  Pulse 89  Temp(Src) 98.2 F (36.8 C) (Oral)  Resp 20  Ht 5\' 7"  (1.702 m)  Wt 127 lb 13.9 oz (58 kg)  BMI 20.02 kg/m2  SpO2 99% Physical Exam  Constitutional: He appears well-developed and well-nourished. No distress.  HENT:  Head: Normocephalic.  Eyes: Conjunctivae are normal. Pupils are equal, round, and reactive to light. No scleral icterus.  Neck: Normal range of motion. Neck supple. No thyromegaly present.  Cardiovascular: Normal rate and regular rhythm.  Exam reveals no gallop and no friction rub.   No murmur heard. Pulmonary/Chest: Effort normal and breath sounds normal. No respiratory distress. He has no  wheezes. He has no rales.  Abdominal: Soft. Bowel sounds are normal. He exhibits no distension. There is no tenderness. There is no rebound.  Musculoskeletal: Normal range of motion.  Neurological: He is alert.  Right pronator drift. No obvious facial droop. A fascia only able to say "hello". No extraocular movement palsy's or other noted cranial nerve abnormalities. Normal strength to bilateral lower extremities.  Skin: Skin is warm and dry. No rash noted.  Psychiatric: He has a normal mood and affect. His behavior is normal.    ED Course  Procedures (including critical care time) Labs Review Labs Reviewed  COMPREHENSIVE METABOLIC PANEL - Abnormal; Notable for the following:    Glucose, Bld 109 (*)    BUN <5 (*)    Albumin 3.3 (*)    ALT 8 (*)    All other components within normal limits  CBC WITH DIFFERENTIAL/PLATELET - Abnormal; Notable for the following:    RBC 3.96 (*)    HCT 36.1 (*)    RDW 18.2 (*)    Eosinophils Relative 7 (*)    All other components within normal limits  URINE RAPID DRUG SCREEN (HOSP PERFORMED) - Abnormal; Notable for the following:    Tetrahydrocannabinol POSITIVE (*)    All other components within normal limits  ACETAMINOPHEN LEVEL - Abnormal; Notable for the following:    Acetaminophen (Tylenol), Serum <10 (*)    All other components within normal limits  MAGNESIUM - Abnormal; Notable for the following:    Magnesium 1.4 (*)    All other components within normal limits  BASIC METABOLIC PANEL - Abnormal; Notable for the following:    BUN <5 (*)    Calcium 8.7 (*)    All other components within normal limits  CBC - Abnormal; Notable for the following:    RBC 3.98 (*)    Hemoglobin 12.9 (*)    HCT 36.6 (*)    RDW 18.4 (*)    All other components within normal limits  MAGNESIUM - Abnormal; Notable for the following:    Magnesium 1.6 (*)    All other components within normal limits  BASIC METABOLIC PANEL - Abnormal; Notable for the following:     Potassium 3.4 (*)    BUN <5 (*)    All other components within normal limits  BASIC METABOLIC PANEL - Abnormal; Notable for the following:    Sodium 134 (*)    Glucose, Bld 117 (*)    BUN <5 (*)    All other components within normal limits  MRSA PCR SCREENING  URINALYSIS, ROUTINE W REFLEX MICROSCOPIC  PROTIME-INR  AMMONIA  ETHANOL  SALICYLATE LEVEL  RPR  HIV ANTIBODY (ROUTINE TESTING)  MAGNESIUM  CBG MONITORING, ED    Imaging Review US Abdomen Complete  07/06/2014   CLINICAL DATA:  Acute encephalopathy.  EXAM: ULTRASOUND ABDOMEN COMPLETE  COMPARISON:  Plain film 12/11/2012  FINDINGS: Gallbladder: No gallstones or wall thickening visualized. No sonographic Murphy sign noted.  Common bile duct: Diameter: Normal, 4 mm.  Liver: No gross abnormality identified. No definite cirrhosis. Normal in echotexture.  IVC: Not well visualized.  Pancreas: Obscured/ not visualized.  Spleen: Size and appearance within normal limits.  Right Kidney: Length: 10.1 cm. Echogenicity within normal limits. No mass or hydronephrosis visualized.  Left Kidney: Length: 10.0 cm. Echogenicity within normal limits. No mass or hydronephrosis visualized.  Abdominal aorta: No aneurysm identified. The bifurcation is obscured.  Other findings: No ascites.  Exam is degraded by patient clinical status. Unable to follow directions and hold breath.  IMPRESSION: 1. Decreased sensitivity and specificity exam due to technique related factors, as described above. 2. Given this factor, no acute process in the abdomen.   Electronically Signed   By: Abigail Miyamoto [name]   On: 07/06/2014 14:53     EKG Interpretation None      MDM   Final diagnoses:  Aphasia    Patient's CT shows no signs of acute abnormality.  He is not tachycardic or tremulous. He is able to say hello, okay, and a few other words. Is not able to follow conversation and still remains essentially aphasic. He has some clumsiness of his right arm is still has inability  to supinate the arm when his elbows extended, immediately falls into pronator drift.  Review his chart he had a quite similar episode in March of this year where he presented with a fascia and right arm weakness. Normal CT and MRI. Ultimately ended in withdrawal requiring ICU care.  At this point working diagnosis is that of possible Todd's paresis following unwitnessed alcohol withdrawal seizure. However he does not currently show tachycardia or tremors or other obvious signs of early withdrawal. Discussed with internal medicine resident on-call, discussed with Dr. Doy Mince of neurology. MRI ordered. CIWA protocol initiated. Plan will be admission. Withdrawal prophylaxis. Further care as per neurology and MRI consultation and results.    Tanna Furry, MD 07/07/14 1240

## 2014-07-04 NOTE — Plan of Care (Signed)
Problem: Acute Treatment Outcomes Goal: tPA Patient w/o S&S of bleeding Outcome: Not Applicable Date Met:  07/04/14 Pt outside of tpa window     

## 2014-07-04 NOTE — H&P (Addendum)
Date: 07/04/2014               Patient Name:  Miguel Hanson MRN: [id number]  DOB: 1945/10/18 Age / Sex: 69 y.o., male   PCP: [place] Service: Internal Medicine Teaching Service         Attending Physician: Dr. Sid Falcon, MD    First Contact: Dr. Posey Pronto  Pager: [phone]   Second Contact: Dr. Ronnald Ramp  Pager: [phone]        After Hours (After 5p/  First Contact Pager: [phone]  weekends / holidays): Second Contact Pager: [phone]   Chief Complaint: Altered mental status  History of Present Illness: Miguel Hanson is a 69 year old male with history of polysubstance abuse [alcohol, marijuana], history of COPD, seizure disorder, hypertension, diastolic heart failure, history of PTSD who presented to the ED with altered mental status. As patient could not provide history, information was collected from chart review as no family is available by phone.  Per ED provider's note, family went to see him in his room last night, and they noted that he was unable to use his right arm or speak with words other than, "hello." They do not know his last drink. At his most recent hospitalization in March 2016, he presented with similar symptoms though required transfer to the ICU for Precedex gtt for worsening mental status. He reported drinking "1 pint of brandy every 2 days" at that time though family members noted that he had increased his alcohol intake. Workup for CVA, including brain MRI, was otherwise unremarkable for acute process. Neurology at that time attributed his mental status to Wernicke's encephalopathy/Korsakoff syndrome with superimposed agitated delirium. EEG was also notable for left hemispheric focal slowing and intermittent periodic sharp discharges of triphasic morphology in the left occipital region suggestive of postictal phenomena especially following right focal seizure activity, so he was discharged on Keppra 500 mg twice daily.   In the ED, Neurology was  consulted, and he was placed on CIWA protocol.   Meds: Current Facility-Administered Medications  Medication Dose Route Frequency Provider Last Rate Last Dose  . LORazepam (ATIVAN) injection 0-4 mg  0-4 mg Intravenous 4 times per day Tanna Furry, MD      . LORazepam (ATIVAN) injection 0-4 mg  0-4 mg Intravenous Q12H Tanna Furry, MD      . LORazepam (ATIVAN) tablet 0-4 mg  0-4 mg Oral 4 times per day Tanna Furry, MD      . LORazepam (ATIVAN) tablet 0-4 mg  0-4 mg Oral Q12H Tanna Furry, MD      . thiamine (B-1) injection 100 mg  100 mg Intravenous Daily Tanna Furry, MD      . thiamine (VITAMIN B-1) tablet 100 mg  100 mg Oral Daily Tanna Furry, MD       Current Outpatient Prescriptions  Medication Sig Dispense Refill  . clonazePAM (KLONOPIN) 1 MG tablet Take 1 tablet (1 mg total) by mouth 2 (two) times daily as needed for anxiety. 15 tablet 0  . feeding supplement, ENSURE COMPLETE, (ENSURE COMPLETE) LIQD Take 237 mLs by mouth 2 (two) times daily between meals. 60 Bottle 3  . folic acid (FOLVITE) 1 MG tablet Take 1 tablet (1 mg total) by mouth daily. 30 tablet 3  . lactose free nutrition (BOOST) LIQD Take 237 mLs by mouth 3 (three) times daily between meals.    . levETIRAcetam (KEPPRA) 500 MG tablet Take 1 tablet (  500 mg total) by mouth 2 (two) times daily. 60 tablet 3  . magnesium oxide (MAG-OX) 400 (241.3 MG) MG tablet Take 1 tablet (400 mg total) by mouth daily. 30 tablet 3  . metoprolol tartrate (LOPRESSOR) 25 MG tablet Take 0.5 tablets (12.5 mg total) by mouth 2 (two) times daily. 60 tablet 3  . thiamine 100 MG tablet Take 1 tablet (100 mg total) by mouth daily. 30 tablet 3    Allergies: Allergies as of 07/04/2014  . (No Known Allergies)   Past Medical History  Diagnosis Date  . Hypertension   . Seizures   . ETOH abuse   . Drug abuse   . PTSD (post-traumatic stress disorder)   . H/O prolonged Q-T interval on ECG   . Polysubstance abuse   . Alcohol abuse   . Depression    Past  Surgical History  Procedure Laterality Date  . No past surgeries     No family history on file. History   Social History  . Marital Status: Married    Spouse Name: N/A  . Number of Children: N/A  . Years of Education: N/A   Occupational History  . Not on file.   Social History Main Topics  . Smoking status: Current Some Day Smoker -- 0.50 packs/day for 49 years    Types: Cigarettes  . Smokeless tobacco: Current User  . Alcohol Use: 21.6 oz/week    36 Shots of liquor per week     Comment: 03/01/2013 "drink a pint of brandy q 2 days"  . Drug Use: Yes    Special: Marijuana     Comment: per spouse per ems  . Sexual Activity: No   Other Topics Concern  . Not on file   Social History Narrative    Review of Systems: Unable to be performed given the patient's mental status  Physical Exam: Blood pressure 178/90, pulse 98, temperature 98.4 F (36.9 C), temperature source Oral, resp. rate 17, SpO2 100 %.  General: Elderly Afro-American male, malodorous resting in bed, NAD HEENT: PERRL, EOMI, no scleral icterus, oropharynx clear Cardiac: Tachycardic, no rubs, murmurs or gallops Pulm: clear to auscultation bilaterally, no wheezes, rales, or rhonchi Abd: soft, nontender, nondistended, BS present Ext: warm and well perfused, no pedal or tibial edema Neuro: responds to questions inappropriately; moving all extremities freely responds to questions with one-word answers though sometimes with numbers, "1, 10, 20," able to follow some commands though could not perform full neurologic assessment, 2+ patellar reflexes, 2+ grip strength, 5 out of 5 bilateral upper and lower extremity strength   Lab results: Basic Metabolic Panel:  Recent Labs  07/04/14 1016  NA 138  K 4.7  CL 105  CO2 22  GLUCOSE 109*  BUN <5*  CREATININE 0.76  CALCIUM 9.2   Liver Function Tests:  Recent Labs  07/04/14 1016  AST 17  ALT 8*  ALKPHOS 60  BILITOT 0.4  PROT 7.3  ALBUMIN 3.3*     Recent Labs  07/04/14 1050  AMMONIA 27   CBC:  Recent Labs  07/04/14 1016  WBC 5.9  NEUTROABS 3.0  HGB 13.0  HCT 36.1*  MCV 91.2  PLT 199   CBG:  Recent Labs  07/04/14 1038  GLUCAP 95   Coagulation:  Recent Labs  07/04/14 1016  LABPROT 15.2  INR 1.19   Urine Drug Screen: Drugs of Abuse     Component Value Date/Time   LABOPIA NONE DETECTED 07/04/2014 1044   LABOPIA NEGATIVE  12/11/2012 [place] 07/04/2014 1044   COCAINSCRNUR NEGATIVE 12/11/2012 1207   LABBENZ NONE DETECTED 07/04/2014 1044   LABBENZ POSITIVE* 12/11/2012 1207   AMPHETMU NONE DETECTED 07/04/2014 1044   AMPHETMU NEGATIVE 12/11/2012 1207   THCU POSITIVE* 07/04/2014 1044   LABBARB NONE DETECTED 07/04/2014 1044    Alcohol Level:  Recent Labs  07/04/14 1049  ETH <5   Urinalysis:  Recent Labs  07/04/14 1044  COLORURINE YELLOW  LABSPEC 1.013  PHURINE 6.5  GLUCOSEU NEGATIVE  HGBUR NEGATIVE  BILIRUBINUR NEGATIVE  KETONESUR NEGATIVE  PROTEINUR NEGATIVE  UROBILINOGEN 0.2  NITRITE NEGATIVE  LEUKOCYTESUR NEGATIVE   Imaging results:  Ct Head Wo Contrast  07/04/2014   CLINICAL DATA:  69 year old male with altered mental status, a not moving right upper extremity. Initial encounter.  EXAM: CT HEAD WITHOUT CONTRAST  TECHNIQUE: Contiguous axial images were obtained from the base of the skull through the vertex without intravenous contrast.  COMPARISON:  Brain MRI 05/08/2014 and earlier.  FINDINGS: Chronic left sphenoid sinusitis. Stable left frontal sinus opacification. Visualized orbits and scalp soft tissues are within normal limits. No acute osseous abnormality identified.  Dominant distal left vertebral artery. Calcified atherosclerosis at the skull base. Stable cerebral volume. No ventriculomegaly. No midline shift, mass effect, or evidence of intracranial mass lesion. Patchy and confluent white matter hypodensity appears stable. No evidence of cortically based  acute infarction identified. No acute intracranial hemorrhage identified.  IMPRESSION: 1. No acute intracranial abnormality. Stable cerebral white matter changes. 2. Chronic left sphenoid and frontal sinusitis.   Electronically Signed   By: Genevie Ann [name]   On: 07/04/2014 11:48    Other results: EKG: Reviewed and compared with 05/19/14 Type I AV block: Prolonged PR interval Prolonged QTc interval   Assessment & Plan by Problem:  Mr. Ryans is a 69 year old male with history of polysubstance abuse [alcohol, marijuana], history of COPD, seizure disorder, hypertension, diastolic heart failure, history of PTSD who presented to the ED with acute on chronic encephalopathy.  Acute on chronic encephalopathy: Possible etiologies include postictal state complicated by nonadherence to treatment and/or alcohol withdrawal seizures, worsening Wernicke encephalopathy/Korsakoff syndrome, TIA/CVA, toxin ingestion, and metabolic. Neurologic exam appears largely nonfocal and consistently documented as such by several providers and is less suggestive of an acute process though his ability to answer questions appears to be variable amongst the multiple providers which does appear consistent with findings noted on his most recent discharge. Head CT and brain MRI also without acute findings. Serum ethanol, acetaminophen, salicylate level, though he does have a history of polysubstance abuse as noted on prior hospitalizations and is consistent with UDS positive for marijuana. No electrolyte abnormalities to account for his presentation. No EKG findings suggestive of ACS. -Continue neuro checks -Order RN swallow screen -Give Keppra 1000 mg IV per neurology recommendations until he can tolerate oral intake -Give thiamine 100 mg IV now and start 100 mg oral tablet -Give multivitamin, folate -Continue ensure complete -Start normal saline at 75 mL/hour -Recheck BMET and CBC tomorrow morning -Neurology following, appreciate  recommendations  Polysubstance abuse [alcohol, marijuana]: UDS on admission notable for marijuana, and he does have an extensive history of alcohol abuse. -Start CIWA protocol  Prolonged QTC: Also present on most recent hospitalization. -Check magnesium -Monitor on telemetry  Seizure disorder: Likely secondary to alcohol abuse in the absence of other causes. Most recent head imaging without findings that could be contributory. -Place on seizure precautions  Diastolic heart failure: EF  55-60% with grade 1 diastolic dysfunction as noted on echo 05/08/14. -Continue to monitor  History of macrocytic anemia: Hemoglobin 13, MCV 91 on admission, baseline mostly 10-12 with MCV trending greater than 100 -Continue to follow CBC  History of COPD: Tobacco abuse noted on prior admissions though no PFTs can be found on file. -Continue to monitor  History of PTSD: History of present illness noted to be secondary to his experience in the [place] in an admission note dated 11/22/10. Previously on SSRI therapy though none found on current home medication. -Continue Klonopin 1 mg twice daily -Continue following  Hypertension: Continue home metoprolol 12.5 mg twice daily.  #FEN:  -Diet: NPO  #DVT prophylaxis: Lovenox  #CODE STATUS: FULL CODE  Dispo: Disposition is deferred at this time, awaiting improvement of current medical problems.   The patient does not know have a current PCP [facility]) and does not know need an [facility] hospital follow-up appointment after discharge.  The patient does not know have transportation limitations that hinder transportation to clinic appointments.  Signed: Riccardo Dubin, MD 07/04/2014, 4:23 PM

## 2014-07-04 NOTE — ED Notes (Signed)
Katie, RN aware of plan to transport pt to floor after MRI, Katie, RN aware of need for ativan & Thiamine to be administered upon his arrival to [facility]

## 2014-07-04 NOTE — ED Notes (Signed)
Jeneen Rinks, MD notified re: pts symptoms, MD at bedside

## 2014-07-05 DIAGNOSIS — F1020 Alcohol dependence, uncomplicated: Secondary | ICD-10-CM

## 2014-07-05 DIAGNOSIS — G9340 Encephalopathy, unspecified: Secondary | ICD-10-CM

## 2014-07-05 DIAGNOSIS — Z8709 Personal history of other diseases of the respiratory system: Secondary | ICD-10-CM

## 2014-07-05 DIAGNOSIS — Z862 Personal history of diseases of the blood and blood-forming organs and certain disorders involving the immune mechanism: Secondary | ICD-10-CM

## 2014-07-05 DIAGNOSIS — I4581 Long QT syndrome: Secondary | ICD-10-CM

## 2014-07-05 DIAGNOSIS — Z8659 Personal history of other mental and behavioral disorders: Secondary | ICD-10-CM

## 2014-07-05 DIAGNOSIS — F1210 Cannabis abuse, uncomplicated: Secondary | ICD-10-CM

## 2014-07-05 DIAGNOSIS — I10 Essential (primary) hypertension: Secondary | ICD-10-CM

## 2014-07-05 DIAGNOSIS — I5032 Chronic diastolic (congestive) heart failure: Secondary | ICD-10-CM

## 2014-07-05 LAB — BASIC METABOLIC PANEL
Anion gap: 7 (ref 5–15)
CO2: 23 mmol/L (ref 22–32)
Calcium: 8.7 mg/dL — ABNORMAL LOW (ref 8.9–10.3)
Chloride: 107 mmol/L (ref 101–111)
Creatinine, Ser: 0.7 mg/dL (ref 0.61–1.24)
GFR calc Af Amer: >60 mL/min (ref >60)
GFR calc non Af Amer: >60 mL/min (ref >60)
Glucose, Bld: 89 mg/dL (ref 70–99)
POTASSIUM: 4.3 mmol/L (ref 3.5–5.1)
Sodium: 137 mmol/L (ref 135–145)

## 2014-07-05 LAB — CBC
HCT: 36.6 % — ABNORMAL LOW (ref 39.0–52.0)
HEMOGLOBIN: 12.9 g/dL — AB (ref 13.0–17.0)
MCH: 32.4 pg (ref 26.0–34.0)
MCHC: 35.2 g/dL (ref 30.0–36.0)
MCV: 92 fL (ref 78.0–100.0)
PLATELETS: 187 10*3/uL (ref 150–400)
RBC: 3.98 MIL/uL — AB (ref 4.22–5.81)
RDW: 18.4 % — ABNORMAL HIGH (ref 11.5–15.5)
WBC: 9.8 10*3/uL (ref 4.0–10.5)

## 2014-07-05 MED ORDER — LORAZEPAM 2 MG/ML IJ SOLN
0.0000 mg | Freq: Two times a day (BID) | INTRAMUSCULAR | Status: AC
Start: 2014-07-07 — End: 2014-07-09

## 2014-07-05 MED ORDER — LORAZEPAM 2 MG/ML IJ SOLN
0.0000 mg | Freq: Four times a day (QID) | INTRAMUSCULAR | Status: AC
  Administered 2014-07-06: 1 mg via INTRAVENOUS
  Administered 2014-07-06 – 2014-07-07 (×2): 2 mg via INTRAVENOUS
  Filled 2014-07-05 (×3): qty 1

## 2014-07-05 MED ORDER — VITAMIN B-1 100 MG PO TABS: 500.0000 mg | ORAL_TABLET | Freq: Three times a day (TID) | ORAL | Status: DC

## 2014-07-05 MED ORDER — LEVETIRACETAM 500 MG PO TABS
1000.0000 mg | ORAL_TABLET | Freq: Two times a day (BID) | ORAL | Status: DC
  Administered 2014-07-05: 1000 mg via ORAL
  Filled 2014-07-05 (×2): qty 2

## 2014-07-05 MED ORDER — SODIUM CHLORIDE 0.9 % IV SOLN
1000.0000 mg | Freq: Two times a day (BID) | INTRAVENOUS | Status: DC
  Administered 2014-07-05: 1000 mg via INTRAVENOUS
  Filled 2014-07-05 (×2): qty 10

## 2014-07-05 MED ORDER — MAGNESIUM SULFATE 2 GM/50ML IV SOLN
2.0000 g | Freq: Once | INTRAVENOUS | Status: AC
  Administered 2014-07-05: 2 g via INTRAVENOUS
  Filled 2014-07-05: qty 50

## 2014-07-05 MED ORDER — THIAMINE HCL 100 MG/ML IJ SOLN
500.0000 mg | Freq: Three times a day (TID) | INTRAVENOUS | Status: AC
  Administered 2014-07-05 – 2014-07-06 (×5): 500 mg via INTRAVENOUS
  Filled 2014-07-05 (×7): qty 5

## 2014-07-05 MED ORDER — LORAZEPAM 2 MG/ML IJ SOLN
1.0000 mg | Freq: Four times a day (QID) | INTRAMUSCULAR | Status: AC | PRN
  Administered 2014-07-05 – 2014-07-06 (×2): 1 mg via INTRAVENOUS
  Filled 2014-07-05 (×3): qty 1

## 2014-07-05 MED ORDER — LORAZEPAM 1 MG PO TABS: 1.0000 mg | ORAL_TABLET | Freq: Four times a day (QID) | ORAL | Status: AC | PRN

## 2014-07-05 NOTE — Progress Notes (Signed)
Subjective: This AM, no family is at bedside though he appears mildly improved from yesterday. Overnight, CIWA was 6 x 2 and 4 this AM again. When asked if he would like to go home, he responds, "Yes, ready."   Objective: Vital signs in last 24 hours: Filed Vitals:   07/05/14 0500 07/05/14 0700 07/05/14 0750 07/05/14 0800  BP: 141/83 134/74  146/75  Pulse: 76 75  65  Temp:   98.8 F (37.1 C)   TempSrc:   Oral   Resp: 17 18  17   Height:      Weight:      SpO2: 89% 97%  94%   Weight change:   Intake/Output Summary (Last 24 hours) at 07/05/14 0911 Last data filed at 07/05/14 0800  Gross per 24 hour  Intake    400 ml  Output    500 ml  Net   -100 ml   General: Elderly Afro-American male, resting in bed, NAD HEENT: PERRL, EOMI, no scleral icterus, oropharynx clear Cardiac: RRR, no rubs, murmurs or gallops Pulm: mild wheezing in the anterior lung fields, no wheezes, rales, or rhonchi Abd: soft, nontender, nondistended, BS present Ext: warm and well perfused, no pedal or tibial edema Neuro: moving all extremities freely, responds to questions with one-word answers though sometimes with numbers like yesterday, able to follow some commands with strength testing though unable to wiggle toes on command or follow cerebellar testing, 2+ grip strength, 5 out of 5 bilateral upper and lower extremity strength  Lab Results: Basic Metabolic Panel:  Recent Labs Lab 07/04/14 1016 07/04/14 1926 07/05/14 0236  NA 138  --  137  K 4.7  --  4.3  CL 105  --  107  CO2 22  --  23  GLUCOSE 109*  --  89  BUN <5*  --  <5*  CREATININE 0.76  --  0.70  CALCIUM 9.2  --  8.7*  MG  --  1.4*  --    Liver Function Tests:  Recent Labs Lab 07/04/14 1016  AST 17  ALT 8*  ALKPHOS 60  BILITOT 0.4  PROT 7.3  ALBUMIN 3.3*    Recent Labs Lab 07/04/14 1050  AMMONIA 27   CBC:  Recent Labs Lab 07/04/14 1016 07/05/14 0236  WBC 5.9 9.8  NEUTROABS 3.0  --   HGB 13.0 12.9*  HCT 36.1*  36.6*  MCV 91.2 92.0  PLT 199 187   CBG:  Recent Labs Lab 07/04/14 1038  GLUCAP 95   Coagulation:  Recent Labs Lab 07/04/14 1016  LABPROT 15.2  INR 1.19   Urinalysis:  Recent Labs Lab 07/04/14 1044  COLORURINE YELLOW  LABSPEC 1.013  PHURINE 6.5  GLUCOSEU NEGATIVE  HGBUR NEGATIVE  BILIRUBINUR NEGATIVE  KETONESUR NEGATIVE  PROTEINUR NEGATIVE  UROBILINOGEN 0.2  NITRITE NEGATIVE  LEUKOCYTESUR NEGATIVE    Micro Results: Recent Results (from the past 240 hour(s))  MRSA PCR Screening     Status: None   Collection Time: 07/04/14  4:30 PM  Result Value Ref Range Status   MRSA by PCR NEGATIVE NEGATIVE Final    Comment:        The GeneXpert MRSA Assay (FDA approved for NASAL specimens only), is one component of a comprehensive MRSA colonization surveillance program. It is not intended to diagnose MRSA infection nor to guide or monitor treatment for MRSA infections.    Studies/Results: Ct Head Wo Contrast  07/04/2014   CLINICAL DATA:  69 year old male with altered mental  status, a not moving right upper extremity. Initial encounter.  EXAM: CT HEAD WITHOUT CONTRAST  TECHNIQUE: Contiguous axial images were obtained from the base of the skull through the vertex without intravenous contrast.  COMPARISON:  Brain MRI 05/08/2014 and earlier.  FINDINGS: Chronic left sphenoid sinusitis. Stable left frontal sinus opacification. Visualized orbits and scalp soft tissues are within normal limits. No acute osseous abnormality identified.  Dominant distal left vertebral artery. Calcified atherosclerosis at the skull base. Stable cerebral volume. No ventriculomegaly. No midline shift, mass effect, or evidence of intracranial mass lesion. Patchy and confluent white matter hypodensity appears stable. No evidence of cortically based acute infarction identified. No acute intracranial hemorrhage identified.  IMPRESSION: 1. No acute intracranial abnormality. Stable cerebral white matter  changes. 2. Chronic left sphenoid and frontal sinusitis.   Electronically Signed   By: Genevie Ann [name]   On: 07/04/2014 11:48   [name] Contrast  07/04/2014   CLINICAL DATA:  Episodic RIGHT upper extremity weakness. Confusion. History of epilepsy. Known ETOH abuse.  EXAM: MRI HEAD WITHOUT CONTRAST  TECHNIQUE: Multiplanar, multiecho pulse sequences of the brain and surrounding structures were obtained without intravenous contrast.  COMPARISON:  CT head earlier today.  MR head 05/08/2014.  FINDINGS: No evidence for acute infarction, hemorrhage, mass lesion, hydrocephalus, or extra-axial fluid. Extensive cerebral and cerebellar atrophy with chronic microvascular ischemic change affecting the periventricular and subcortical white matter. Remote RIGHT pontine lacunar infarct. Pituitary, pineal, and cerebellar tonsils unremarkable. No upper cervical lesions. Flow voids are maintained throughout the carotid, basilar, and vertebral arteries. There are no areas of chronic hemorrhage.  Thin section imaging through the temporal lobes demonstrates no inflammatory or destructive lesion. Extracranial soft tissues grossly unremarkable.  Compared with priors, a similar appearance is noted.  IMPRESSION: Atrophy and small vessel disease. No acute intracranial findings. Stable appearance from priors.   Electronically Signed   By: Rolla Flatten [name]   On: 07/04/2014 15:49   Medications: I have reviewed the patient's current medications. Scheduled Meds: . enoxaparin (LOVENOX) injection  40 mg Subcutaneous Q24H  . feeding supplement (ENSURE ENLIVE)  237 mL Oral BID BM  . folic acid  1 mg Oral Daily  . levETIRAcetam  1,000 mg Oral BID  . magnesium oxide  400 mg Oral Daily  . metoprolol tartrate  12.5 mg Oral BID  . multivitamin with minerals  1 tablet Oral Daily  . sodium chloride  3 mL Intravenous Q12H  . thiamine IV  500 mg Intravenous TID   Continuous Infusions:  PRN Meds:.clonazePAM, LORazepam,  LORazepam Assessment/Plan: Active Problems:   Aphasia   Acute encephalopathy  Mr. Roes is a 69 year old male with history of polysubstance abuse [alcohol, marijuana], history of COPD, seizure disorder, hypertension, diastolic heart failure, history of PTSD who presented to the ED with acute on chronic encephalopathy.  Acute on chronic encephalopathy: Possible etiologies include postictal state from nonadherence to treatment and/or alcohol withdrawal seizures superimposed upon Wernicke encephalopathy/Korsakoff syndrome. Obvious expressive aphasia noted. Electrolytes again stable from yesterday making metabolic causes less likely. Neurologic exam mostly unchanged from yesterday and will need to establish a better baseline by speaking with caregivers. SLP evaluation reassuring. -Transition Keppra 1000 mg IV to oral -Increase thiamine 100mg  IV->500 mg IV TID for acute Wernicke encephalopathy -Give multivitamin, folate -Continue Ensure Complete -Neurology following, appreciate recommendations  Polysubstance abuse [alcohol, marijuana]: UDS on admission notable for marijuana, and he does have an extensive history of alcohol abuse. Given his current mental baseline, it  is unclear how he is obtaining such excessive amounts of alcohol and does raise concern for his home situation. CIWA scores as noted above. -Continue CIWA protocol -Speak to caregivers  Prolonged QTC: Also present on most recent hospitalization. Mg low. -Give 2g Mg IV now and continue with home Mag-Ox -Monitor on telemetry  Seizure disorder: Likely secondary to alcohol abuse in the absence of other causes. Most recent head imaging without findings that could be contributory. -Continue seizure precautions  Diastolic heart failure: EF 55-60% with grade 1 diastolic dysfunction as noted on echo 05/08/14. -Continue to monitor  History of macrocytic anemia: Hemoglobin 12.9, MCV 92 today, baseline mostly 10-12 with MCV trending greater  than 100 over the past year. -Continue to follow CBC  History of COPD: Tobacco abuse noted on prior admissions though no PFTs can be found on file. -Continue to monitor  History of PTSD: Noted to be secondary to his experience in the [place] in an admission note dated 11/22/10. Previously on SSRI therapy though none found on current home medication. -Continue Klonopin 1 mg twice daily  Hypertension: Continue home metoprolol 12.5 mg twice daily.  #FEN:  -Diet: NPO  #DVT prophylaxis: Lovenox  #CODE STATUS: FULL CODE  Dispo: Disposition is deferred at this time, awaiting improvement of current medical problems.   -Consult social work for disposition  The patient does not know have a current PCP [facility]) and does not know need an [facility] hospital follow-up appointment after discharge.  The patient does have transportation limitations that hinder transportation to clinic appointments.  .Services Needed at time of discharge: Y = Yes, Blank = No PT:   OT:   RN:   Equipment:   Other:     LOS: 1 day   Riccardo Dubin, MD 07/05/2014, 9:11 AM

## 2014-07-05 NOTE — Progress Notes (Signed)
  Date: 07/05/2014  Patient name: Miguel Hanson  Medical record number: [id number]  Date of birth: [birth year]   I have seen and evaluated [name] and discussed their care with the Residency Team. Briefly, Mr. Rasmusson is a 69yo man with PMH of substance abuse, most importantly ETOH, COPD, seizure disorder, HTN, dCHF, PTSD and possibly Wernicke's encephalopathy who presented to the ED with altered mental status.  No family present in room when I saw him.  Per chart review, his family noticed an acute change in his mental status that consisted of difficulty speaking and weakness.  He uses ETOH and the family was not clear on his last drink.  He apparently drinks 1 pint of brandy every 2 days.  On exam, he is alert, answers yes to many questions, but mostly speaks in numbers.  It is not clear how well he understands the conversation, but he is able to perform parts of the exam by mimicking.  Neurology saw him in the ED.  CT head and MRI brain showed no acute changes.  Blood work showed normal Cr, normal LFTs.  Normal WBC.   UA clear.  EKG with 1st degree AV block.   During his last hospitalization, his AMS normalized after a prolonged stay and a precedex drip.  Will need close monitoring for withdrawal, repeat seizure and improvement in his MS.   Assessment and Plan: I have seen and evaluated the patient as outlined above. I agree with the formulated Assessment and Plan as detailed in the residents' admission note, with the following changes:   1. Acute encephalopathy - differential includes intoxication with a substance, acute stroke (imaging not concerning), acute withdrawal from ETOH, post-ictal state due to seizure, wernicke encephalopathy (encephalopathy, gait ataxia and oculomotor dysfunction) - Thiamine IV, dose for wernicke's - Neuro checks initially q4 hours, then q8 hours - Keppra IV until passes swallow study - Neurology is following and will assist with dosing - Multivitamin, folate -  SLP, swallow study - NS at 75cc/hr - CIWA protocol - Nutrition consult - Continue home klonipin in acute setting (concern for withdrawal)  2. Prolonged Qtc noted - Check magnesium, replete as needed.   - Telemetry  Other issues per resident note.   Sid Falcon, MD 5/5/20168:19 AM

## 2014-07-05 NOTE — Progress Notes (Signed)
Subjective: Remains aphasic.  Follows commands.  No seizure activity noted.    Objective: Current vital signs: BP 168/98 mmHg  Pulse 73  Temp(Src) 98 F (36.7 C) (Oral)  Resp 24  Ht 5\' 7"  (1.702 m)  Wt 58 kg (127 lb 13.9 oz)  BMI 20.02 kg/m2  SpO2 99% Vital signs in last 24 hours: Temp:  [97.9 F (36.6 C)-99 F (37.2 C)] 98 F (36.7 C) (05/05 1148) Pulse Rate:  [65-98] 73 (05/05 1200) Resp:  [14-24] 24 (05/05 1200) BP: (132-178)/(73-104) 168/98 mmHg (05/05 1200) SpO2:  [89 %-100 %] 99 % (05/05 1200) Weight:  [58 kg (127 lb 13.9 oz)] 58 kg (127 lb 13.9 oz) (05/04 1630)  Intake/Output from previous day: 05/04 0701 - 05/05 0700 In: 400 [I.V.:290; IV Piggyback:110] Out: 300 [Urine:300] Intake/Output this shift: Total I/O In: 240 [P.O.:240] Out: 550 [Urine:550] Nutritional status: Diet regular Room service appropriate?: Yes; Fluid consistency:: Thin  Neurologic Exam: Mental Status: Alert, not oriented, Patient continues to perseverate on the word "one".  Does at times have some fluent responses.  Follows commands but requires some nonverbal cues.  Cranial Nerves: II: Discs flat bilaterally; blinks to threat bilaterally, pupils equal, round, reactive to light and accommodation III,IV, VI: ptosis not present, extra-ocular motions intact bilaterally V,VII: smile symmetric, facial light touch sensation normal bilaterally VIII: hearing normal bilaterally IX,X: uvula rises symmetrically XI: bilateral shoulder shrug XII: midline tongue extension Motor: Moving all extremities antigravity with full strength.  Deep Tendon Reflexes: 2+ and symmetric throughout UE 1+ bilateral KJ and no AJ Plantars: Mute bilaterally   Lab Results: Basic Metabolic Panel:  Recent Labs Lab 07/04/14 1016 07/04/14 1926 07/05/14 0236  NA 138  --  137  K 4.7  --  4.3  CL 105  --  107  CO2 22  --  23  GLUCOSE 109*  --  89  BUN <5*  --  <5*  CREATININE 0.76  --  0.70  CALCIUM 9.2  --  8.7*   MG  --  1.4*  --     Liver Function Tests:  Recent Labs Lab 07/04/14 1016  AST 17  ALT 8*  ALKPHOS 60  BILITOT 0.4  PROT 7.3  ALBUMIN 3.3*   No results for input(s): LIPASE, AMYLASE in the last 168 hours.  Recent Labs Lab 07/04/14 1050  AMMONIA 27    CBC:  Recent Labs Lab 07/04/14 1016 07/05/14 0236  WBC 5.9 9.8  NEUTROABS 3.0  --   HGB 13.0 12.9*  HCT 36.1* 36.6*  MCV 91.2 92.0  PLT 199 187    Cardiac Enzymes: No results for input(s): CKTOTAL, CKMB, CKMBINDEX, TROPONINI in the last 168 hours.  Lipid Panel: No results for input(s): CHOL, TRIG, HDL, CHOLHDL, VLDL, LDLCALC in the last 168 hours.  CBG:  Recent Labs Lab 07/04/14 1038  GLUCAP 95    Microbiology: Results for orders placed or performed during the hospital encounter of 07/04/14  MRSA PCR Screening     Status: None   Collection Time: 07/04/14  4:30 PM  Result Value Ref Range Status   MRSA by PCR NEGATIVE NEGATIVE Final    Comment:        The GeneXpert MRSA Assay (FDA approved for NASAL specimens only), is one component of a comprehensive MRSA colonization surveillance program. It is not intended to diagnose MRSA infection nor to guide or monitor treatment for MRSA infections.     Coagulation Studies:  Recent Labs  07/04/14 1016  LABPROT 15.2  INR 1.19    Imaging: Ct Head Wo Contrast  07/04/2014   CLINICAL DATA:  69 year old male with altered mental status, a not moving right upper extremity. Initial encounter.  EXAM: CT HEAD WITHOUT CONTRAST  TECHNIQUE: Contiguous axial images were obtained from the base of the skull through the vertex without intravenous contrast.  COMPARISON:  Brain MRI 05/08/2014 and earlier.  FINDINGS: Chronic left sphenoid sinusitis. Stable left frontal sinus opacification. Visualized orbits and scalp soft tissues are within normal limits. No acute osseous abnormality identified.  Dominant distal left vertebral artery. Calcified atherosclerosis at the  skull base. Stable cerebral volume. No ventriculomegaly. No midline shift, mass effect, or evidence of intracranial mass lesion. Patchy and confluent white matter hypodensity appears stable. No evidence of cortically based acute infarction identified. No acute intracranial hemorrhage identified.  IMPRESSION: 1. No acute intracranial abnormality. Stable cerebral white matter changes. 2. Chronic left sphenoid and frontal sinusitis.   Electronically Signed   By: Genevie Ann [name]   On: 07/04/2014 11:48   [name] Contrast  07/04/2014   CLINICAL DATA:  Episodic RIGHT upper extremity weakness. Confusion. History of epilepsy. Known ETOH abuse.  EXAM: MRI HEAD WITHOUT CONTRAST  TECHNIQUE: Multiplanar, multiecho pulse sequences of the brain and surrounding structures were obtained without intravenous contrast.  COMPARISON:  CT head earlier today.  MR head 05/08/2014.  FINDINGS: No evidence for acute infarction, hemorrhage, mass lesion, hydrocephalus, or extra-axial fluid. Extensive cerebral and cerebellar atrophy with chronic microvascular ischemic change affecting the periventricular and subcortical white matter. Remote RIGHT pontine lacunar infarct. Pituitary, pineal, and cerebellar tonsils unremarkable. No upper cervical lesions. Flow voids are maintained throughout the carotid, basilar, and vertebral arteries. There are no areas of chronic hemorrhage.  Thin section imaging through the temporal lobes demonstrates no inflammatory or destructive lesion. Extracranial soft tissues grossly unremarkable.  Compared with priors, a similar appearance is noted.  IMPRESSION: Atrophy and small vessel disease. No acute intracranial findings. Stable appearance from priors.   Electronically Signed   By: Rolla Flatten [name]   On: 07/04/2014 15:49    Medications:  I have reviewed the patient's current medications. Scheduled: . enoxaparin (LOVENOX) injection  40 mg Subcutaneous Q24H  . feeding supplement (ENSURE ENLIVE)  237 mL Oral  BID BM  . folic acid  1 mg Oral Daily  . levETIRAcetam  1,000 mg Intravenous Q12H  . magnesium oxide  400 mg Oral Daily  . metoprolol tartrate  12.5 mg Oral BID  . multivitamin with minerals  1 tablet Oral Daily  . sodium chloride  3 mL Intravenous Q12H  . thiamine IV  500 mg Intravenous TID    Assessment/Plan: No further seizures noted.  Patient appears to be tolerating increased dose of Keppra.    Recommendations: 1.  Change Keppra to po  2.  Continue seizure precautions 3.  Speech therapy   LOS: 1 day   Alexis Goodell, MD [facility] [phone] 07/05/2014  12:58 PM

## 2014-07-05 NOTE — Progress Notes (Signed)
Patient transferred from ICU. Patient alert and oriented to self. Patient made comfortable. Will continue to monitor.

## 2014-07-05 NOTE — Discharge Summary (Signed)
Name: Miguel Hanson MRN: [id number] DOB: 06-Nov-1945 69 y.o. PCP: [facility]  Date of Admission: 07/04/2014 10:20 AM Date of Discharge: 07/11/2014 Attending Physician: Sid Falcon, MD  Discharge Diagnosis: Acute on chronic encephalopathy: likely 2/2 seizures and possible Wernicke encephalopathy/Korsakoff syndrome Polysubstance abuse Hypomagnesemia Chronic diastolic heart failure Severe Protein Calorie Malnutrition Hypertension   Discharge Medications:   Medication List    STOP taking these medications        clonazePAM 1 MG tablet  Commonly known as:  KLONOPIN     lisinopril 40 MG tablet  Commonly known as:  PRINIVIL,ZESTRIL     phenytoin 300 MG ER capsule  Commonly known as:  DILANTIN     sertraline 100 MG tablet  Commonly known as:  ZOLOFT     thiamine 100 MG tablet      TAKE these medications        feeding supplement (ENSURE COMPLETE) Liqd  Take 237 mLs by mouth 2 (two) times daily between meals.     folic acid 1 MG tablet  Commonly known as:  FOLVITE  Take 1 tablet (1 mg total) by mouth daily.     levETIRAcetam 500 MG tablet  Commonly known as:  KEPPRA  Take 2 tablets (1,000 mg total) by mouth 2 (two) times daily.     magnesium oxide 400 (241.3 MG) MG tablet  Commonly known as:  MAG-OX  Take 1 tablet (400 mg total) by mouth daily.     metoprolol tartrate 25 MG tablet  Commonly known as:  LOPRESSOR  Take 0.5 tablets (12.5 mg total) by mouth 2 (two) times daily.     multivitamin with minerals Tabs tablet  Take 1 tablet by mouth daily.     oxcarbazepine 600 MG tablet  Commonly known as:  TRILEPTAL  Take 1 tablet (600 mg total) by mouth 2 (two) times daily.     traZODone 50 MG tablet  Commonly known as:  DESYREL  Take 1 tablet (50 mg total) by mouth at bedtime as needed for sleep.        Disposition and follow-up:   Miguel Hanson was discharged from [facility] in Stable condition.  At the hospital follow  up visit please address:  Expressive aphasia: improvement  Adherence to anti-epileptics: Keppra, Trileptal Malnutrition: Hypomagnesemia Polysubstance abuse: Marijuana, alcohol   Follow-up Appointments: Follow-up Information    Follow up with Melvenia Beam, MD. Go on 07/26/2014.   Specialty:  Neurology   Why:  100PM   Contact information:   [place] [id number] [phone]       Discharge Instructions: Discharge Instructions    Call MD for:  difficulty breathing, headache or visual disturbances    Complete by:  As directed      Call MD for:  persistant dizziness or light-headedness    Complete by:  As directed      Call MD for:  temperature >100.4    Complete by:  As directed      Increase activity slowly    Complete by:  As directed            Consultations:    Procedures Performed:  Ct Head Wo Contrast  07/04/2014   CLINICAL DATA:  69 year old male with altered mental status, a not moving right upper extremity. Initial encounter.  EXAM: CT HEAD WITHOUT CONTRAST  TECHNIQUE: Contiguous axial images were obtained from the base of the skull through the vertex without  intravenous contrast.  COMPARISON:  Brain MRI 05/08/2014 and earlier.  FINDINGS: Chronic left sphenoid sinusitis. Stable left frontal sinus opacification. Visualized orbits and scalp soft tissues are within normal limits. No acute osseous abnormality identified.  Dominant distal left vertebral artery. Calcified atherosclerosis at the skull base. Stable cerebral volume. No ventriculomegaly. No midline shift, mass effect, or evidence of intracranial mass lesion. Patchy and confluent white matter hypodensity appears stable. No evidence of cortically based acute infarction identified. No acute intracranial hemorrhage identified.  IMPRESSION: 1. No acute intracranial abnormality. Stable cerebral white matter changes. 2. Chronic left sphenoid and frontal sinusitis.   Electronically Signed   By: Genevie Ann [name]   On: 07/04/2014 11:48   [name] Contrast  07/04/2014   CLINICAL DATA:  Episodic RIGHT upper extremity weakness. Confusion. History of epilepsy. Known ETOH abuse.  EXAM: MRI HEAD WITHOUT CONTRAST  TECHNIQUE: Multiplanar, multiecho pulse sequences of the brain and surrounding structures were obtained without intravenous contrast.  COMPARISON:  CT head earlier today.  MR head 05/08/2014.  FINDINGS: No evidence for acute infarction, hemorrhage, mass lesion, hydrocephalus, or extra-axial fluid. Extensive cerebral and cerebellar atrophy with chronic microvascular ischemic change affecting the periventricular and subcortical white matter. Remote RIGHT pontine lacunar infarct. Pituitary, pineal, and cerebellar tonsils unremarkable. No upper cervical lesions. Flow voids are maintained throughout the carotid, basilar, and vertebral arteries. There are no areas of chronic hemorrhage.  Thin section imaging through the temporal lobes demonstrates no inflammatory or destructive lesion. Extracranial soft tissues grossly unremarkable.  Compared with priors, a similar appearance is noted.  IMPRESSION: Atrophy and small vessel disease. No acute intracranial findings. Stable appearance from priors.   Electronically Signed   By: Rolla Flatten [name]   On: 07/04/2014 15:49   US Abdomen Complete  07/06/2014   CLINICAL DATA:  Acute encephalopathy.  EXAM: ULTRASOUND ABDOMEN COMPLETE  COMPARISON:  Plain film 12/11/2012  FINDINGS: Gallbladder: No gallstones or wall thickening visualized. No sonographic Murphy sign noted.  Common bile duct: Diameter: Normal, 4 mm.  Liver: No gross abnormality identified. No definite cirrhosis. Normal in echotexture.  IVC: Not well visualized.  Pancreas: Obscured/ not visualized.  Spleen: Size and appearance within normal limits.  Right Kidney: Length: 10.1 cm. Echogenicity within normal limits. No mass or hydronephrosis visualized.  Left Kidney: Length: 10.0 cm. Echogenicity within normal  limits. No mass or hydronephrosis visualized.  Abdominal aorta: No aneurysm identified. The bifurcation is obscured.  Other findings: No ascites.  Exam is degraded by patient clinical status. Unable to follow directions and hold breath.  IMPRESSION: 1. Decreased sensitivity and specificity exam due to technique related factors, as described above. 2. Given this factor, no acute process in the abdomen.   Electronically Signed   By: Abigail Miyamoto [name]   On: 07/06/2014 14:53   Admission HPI: Miguel Hanson is a 69 year old male with history of polysubstance abuse [alcohol, marijuana], history of COPD, seizure disorder, hypertension, diastolic heart failure, history of PTSD who presented to the ED with altered mental status. As patient could not provide history, information was collected from chart review as no family is available by phone.  Per ED provider's note, family went to see him in his room last night, and they noted that he was unable to use his right arm or speak with words other than, "hello." They do not know his last drink. At his most recent hospitalization in March 2016, he presented with similar symptoms though required transfer to the ICU  for Precedex gtt for worsening mental status. He reported drinking "1 pint of brandy every 2 days" at that time though family members noted that he had increased his alcohol intake. Workup for CVA, including brain MRI, was otherwise unremarkable for acute process. Neurology at that time attributed his mental status to Wernicke's encephalopathy/Korsakoff syndrome with superimposed agitated delirium. EEG was also notable for left hemispheric focal slowing and intermittent periodic sharp discharges of triphasic morphology in the left occipital region suggestive of postictal phenomena especially following right focal seizure activity, so he was discharged on Keppra 500 mg twice daily.   In the ED, Neurology was consulted, and he was placed on CIWA  protocol.   Hospital Course by problem list:   Acute on chronic encephalopathy: Most likely multifactorial related to post-ictal state vs alcohol withdrawal superimposed on questionable Wernicke's encephalopathy/Korsakoff syndrome. Head imaging on admission unremarkable for acute lesion, like CVA. Neurology was consulted and recommended increasing Keppra to 1000 mg twice daily. EEG 07/06/14 suggestive of diffuse background slowing, focal slowing in posterior quadrants, L>R. Also shows periodic lateralized discharges in left posterior quadrant, somewhat suggestive of recent clinical focal seizures or post-ictal state following status epilepticus. He was then started on Vimpat 500 mg twice daily. He was continued on CIWA protocol but still had a fluctuating expressive aphasia which Neurology felt was related to seizure-like activity. Repeat EEG was notable for continued periodic sharp wave discharges in the left parietal/temporal region without any overt epileptiform activity. Vimpat was discontinued, and he was started on Trileptal 600 mg twice daily without improvement in his symptoms. At the time of discharge, Neurology recommended outpatient follow-up for up titration of medication and possible consideration for follow-up with a epilepsy monitoring unit at a tertiary care center should his symptoms fail to improve.   Polysubstance abuse: UDS notable for marijuana on admission. Extensive history of alcohol abuse as noted in the chart and corroborated by several family members. He was advised by multiple providers to work on cutting down and pursuing rehab.  Hypomagnesemia: He was given magnesium supplementation by IV and continued on magnesium supplementation. On the day of discharge, magnesium improved to 2.1 from 1.4 on admission and was asked to resume his home magnesium supplementation.  Chronic diastolic heart failure: Lisinopril was held during his admission as he appeared euvolemic though should be  resumed if assessed to be necessary.   Severe Protein Calorie Malnutrition: He was continued on Ensure nutritional supplementation.   HTN: Normotensive during admission on metoprolol. Lisinopril as noted above.   Discharge Vitals:   BP 114/70 mmHg  Pulse 84  Temp(Src) 97.7 F (36.5 C) (Oral)  Resp 18  Ht 5\' 7"  (1.702 m)  Wt 127 lb 13.9 oz (58 kg)  BMI 20.02 kg/m2  SpO2 100%  Discharge Labs:  Results for orders placed or performed during the hospital encounter of 07/04/14 (from the past 24 hour(s))  Basic metabolic panel     Status: None   Collection Time: 07/11/14  4:09 AM  Result Value Ref Range   Sodium 137 135 - 145 mmol/L   Potassium 4.3 3.5 - 5.1 mmol/L   Chloride 101 101 - 111 mmol/L   CO2 27 22 - 32 mmol/L   Glucose, Bld 97 70 - 99 mg/dL   BUN 15 6 - 20 mg/dL   Creatinine, Ser 0.81 0.61 - 1.24 mg/dL   Calcium 9.5 8.9 - 10.3 mg/dL   GFR calc non Af Amer >60 >60 mL/min   GFR calc  Af Amer >60 >60 mL/min   Anion gap 9 5 - 15  CBC     Status: Abnormal   Collection Time: 07/11/14  4:09 AM  Result Value Ref Range   WBC 11.2 (H) 4.0 - 10.5 K/uL   RBC 3.98 (L) 4.22 - 5.81 MIL/uL   Hemoglobin 12.4 (L) 13.0 - 17.0 g/dL   HCT 35.4 (L) 39.0 - 52.0 %   MCV 88.9 78.0 - 100.0 fL   MCH 31.2 26.0 - 34.0 pg   MCHC 35.0 30.0 - 36.0 g/dL   RDW 17.7 (H) 11.5 - 15.5 %   Platelets 189 150 - 400 K/uL  Magnesium     Status: None   Collection Time: 07/11/14  4:09 AM  Result Value Ref Range   Magnesium 2.1 1.7 - 2.4 mg/dL    Signed: Riccardo Dubin, MD 07/11/2014, 1:16 PM    Services Ordered on Discharge: SNF Equipment Ordered on Discharge: none

## 2014-07-05 NOTE — Progress Notes (Signed)
UR completed.  Pt is connected to [facility]. Only needs to contact the [place], [phone] to complete that process.   Sandi Mariscal, RN BSN [place] CCM Trauma/Neuro ICU Case Manager [phone]

## 2014-07-05 NOTE — Evaluation (Signed)
Clinical/Bedside Swallow Evaluation Patient Details  Name: Miguel Hanson MRN: [id number] Date of Birth: [birth year]  Today's Date: 07/05/2014 Time: SLP Start Time (ACUTE ONLY): 7106 SLP Stop Time (ACUTE ONLY): 1045 SLP Time Calculation (min) (ACUTE ONLY): 16 min  Past Medical History:  Past Medical History  Diagnosis Date  . Hypertension   . Seizures   . ETOH abuse   . Drug abuse   . PTSD (post-traumatic stress disorder)   . H/O prolonged Q-T interval on ECG   . Polysubstance abuse   . Alcohol abuse   . Depression    Past Surgical History:  Past Surgical History  Procedure Laterality Date  . No past surgeries     HPI:  69 y.o. male with known ETOH abuse--reported to drink a pint of hard liquor a night. Pateint was recently seen in hospital in March of this year for similar presentation of AMS and right sided weakness which resolved. On that admission he had 2 EEGs which demonstrated left hemispheric focal slowing and intermittent periodic sharp discharges of triphasic morphology in left occipital region. His confusion never fully cleared with question of Wernicke's encephalopathy/korsakoff syndrome with superimposed agitated delirium.  Admitted through ED 5/4 with increased confusion, nonsensical speech.   MRI negative for acute event.  Neurology suspects unwitnessed seizure, [name].    Assessment / Plan / Recommendation Clinical Impression  Pt known to SLP services from prior admissions - communication remains Wernicke's-like (Korsakoff's) with fluent, irrelevant output, marked by perseverations but islands of cogent thought ("I want to get out of here.  When can I get out of here").  Pt demonstrates functional mastication, swift swallow response, no difficulty protecting airway given absence of dysphagic symptoms.  Recommend resuming a regular diet, thin liquids, meds whole with liquids.  May need intermittent supervision if comprehension impacts safety.  No SLP f/u needed -  will sign off.     Aspiration Risk  Mild    Diet Recommendation  (regular foods, thin liquids)   Medication Administration: Whole meds with liquid    Other  Recommendations Oral Care Recommendations: Oral care BID       Swallow Study Prior Functional Status       General Date of Onset: 07/04/14 Other Pertinent Information: 69 y.o. male with known ETOH abuse--reported to drink a pint of hard liquor a night. Pateint was recently seen in hospital in March of this year for similar presentation of AMS and right sided weakness which resolved. On that admission he had 2 EEGs which demonstrated left hemispheric focal slowing and intermittent periodic sharp discharges of triphasic morphology in left occipital region. His confusion never fully cleared with question of Wernicke's encephalopathy/korsakoff syndrome with superimposed agitated delirium.  Admitted through ED 5/4 with increased confusion, nonsensical speech.   MRI negative for acute event.  Neurology suspects unwitnessed seizure, [name].  Type of Study: Bedside swallow evaluation Diet Prior to this Study: NPO Temperature Spikes Noted: No Respiratory Status: Room air Behavior/Cognition: Alert;Confused Oral Cavity - Dentition: Missing dentition Self-Feeding Abilities: Able to feed self Patient Positioning: Upright in bed Baseline Vocal Quality: Normal Volitional Cough: Cognitively unable to elicit Volitional Swallow: Unable to elicit    Oral/Motor/Sensory Function Overall Oral Motor/Sensory Function: Appears within functional limits for tasks assessed   Ice Chips Ice chips: Within functional limits   Thin Liquid Thin Liquid: Within functional limits Presentation: Cup;Straw    Nectar Thick Nectar Thick Liquid: Not tested   Honey Thick Honey Thick Liquid: Not tested  Puree Puree: Within functional limits   Solid  Felipe Cabell L. [place], [place] CCC/SLP Pager [phone]     Solid: Within functional limits       [name] 07/05/2014,10:47 AM

## 2014-07-05 NOTE — Progress Notes (Signed)
Initial Nutrition Assessment  DOCUMENTATION CODES:  Severe malnutrition in context of chronic illness  INTERVENTION:  Ensure Enlive (each supplement provides 350kcal and 20 grams of protein) (BID)  NUTRITION DIAGNOSIS:  Malnutrition related to chronic illness as evidenced by severe depletion of body fat, severe depletion of muscle mass.    GOAL:  Patient will meet greater than or equal to 90% of their needs    MONITOR:  PO intake, Supplement acceptance  REASON FOR ASSESSMENT:  Malnutrition Screening Tool    ASSESSMENT:  History of polysubstance abuse [alcohol, marijuana], history of COPD, seizure disorder, hypertension, diastolic heart failure, history of PTSD who presented to the ED with acute on chronic encephalopathy.   Medications include: folic acid, mag-ox, MVI, and thiamine Na 118 Pt unable to answer any questions and no family present at this time.   Height:  Ht Readings from Last 1 Encounters:  07/04/14 5\' 7"  (1.702 m)    Weight:  Wt Readings from Last 1 Encounters:  07/04/14 127 lb 13.9 oz (58 kg)    Ideal Body Weight:  67.2 kg  Wt Readings from Last 10 Encounters:  07/04/14 127 lb 13.9 oz (58 kg)  05/15/14 120 lb 13 oz (54.8 kg)  05/08/14 120 lb (54.432 kg)  02/27/14 118 lb 13.3 oz (53.9 kg)  12/17/13 116 lb (52.617 kg)  03/03/13 116 lb 4.8 oz (52.753 kg)  12/12/12 116 lb 13.5 oz (53 kg)  05/15/12 111 lb (50.349 kg)  12/18/11 106 lb 3.2 oz (48.172 kg)  09/15/11 124 lb (56.246 kg)    BMI:  Body mass index is 20.02 kg/(m^2).  Estimated Nutritional Needs:  Kcal:  1500-1700  Protein:  75-95  Fluid:  >1.5 L/day  Skin:  Reviewed, no issues  Diet Order:  Diet regular Room service appropriate?: Yes; Fluid consistency:: Thin  EDUCATION NEEDS:  No education needs identified at this time   Intake/Output Summary (Last 24 hours) at 07/05/14 1539 Last data filed at 07/05/14 1200  Gross per 24 hour  Intake    640 ml  Output    850  ml  Net   -210 ml    Last BM:  PTA  [place], [place], [place] Pager [phone] After Hours Pager

## 2014-07-06 ENCOUNTER — Inpatient Hospital Stay (HOSPITAL_COMMUNITY): Payer: Medicare Other

## 2014-07-06 LAB — BASIC METABOLIC PANEL
Anion gap: 10 (ref 5–15)
CO2: 25 mmol/L (ref 22–32)
Calcium: 9.1 mg/dL (ref 8.9–10.3)
Chloride: 102 mmol/L (ref 101–111)
Creatinine, Ser: 0.65 mg/dL (ref 0.61–1.24)
GFR calc Af Amer: >60 mL/min (ref >60)
GFR calc non Af Amer: >60 mL/min (ref >60)
GLUCOSE: 90 mg/dL (ref 70–99)
POTASSIUM: 3.4 mmol/L — AB (ref 3.5–5.1)
Sodium: 137 mmol/L (ref 135–145)

## 2014-07-06 LAB — MAGNESIUM: Magnesium: 1.6 mg/dL — ABNORMAL LOW (ref 1.7–2.4)

## 2014-07-06 MED ORDER — LEVETIRACETAM 500 MG/5ML IV SOLN
1000.0000 mg | Freq: Two times a day (BID) | INTRAVENOUS | Status: DC
  Administered 2014-07-06 – 2014-07-07 (×4): 1000 mg via INTRAVENOUS
  Filled 2014-07-06 (×5): qty 10

## 2014-07-06 MED ORDER — THIAMINE HCL 100 MG/ML IJ SOLN
250.0000 mg | Freq: Every day | INTRAMUSCULAR | Status: DC
Start: 2014-07-07 — End: 2014-07-09
  Administered 2014-07-07 – 2014-07-08 (×2): 250 mg via INTRAVENOUS
  Filled 2014-07-06 (×3): qty 4

## 2014-07-06 MED ORDER — DEXTROSE-NACL 5-0.9 % IV SOLN
INTRAVENOUS | Status: AC
Start: 2014-07-06 — End: 2014-07-07
  Administered 2014-07-06: 13:00:00 via INTRAVENOUS

## 2014-07-06 MED ORDER — POTASSIUM CHLORIDE CRYS ER 20 MEQ PO TBCR
40.0000 meq | EXTENDED_RELEASE_TABLET | Freq: Once | ORAL | Status: DC
  Filled 2014-07-06: qty 2

## 2014-07-06 MED ORDER — LORAZEPAM 2 MG/ML IJ SOLN
1.0000 mg | Freq: Once | INTRAMUSCULAR | Status: AC
  Administered 2014-07-06: 1 mg via INTRAMUSCULAR
  Filled 2014-07-06: qty 1

## 2014-07-06 MED ORDER — FOLIC ACID 5 MG/ML IJ SOLN
1.0000 mg | Freq: Every day | INTRAMUSCULAR | Status: DC
  Administered 2014-07-06 – 2014-07-07 (×2): 1 mg via INTRAVENOUS
  Filled 2014-07-06 (×4): qty 0.2

## 2014-07-06 MED ORDER — MAGNESIUM SULFATE 2 GM/50ML IV SOLN
2.0000 g | Freq: Once | INTRAVENOUS | Status: AC
  Administered 2014-07-06: 2 g via INTRAVENOUS
  Filled 2014-07-06 (×2): qty 50

## 2014-07-06 MED ORDER — LACTULOSE 10 GM/15ML PO SOLN
30.0000 g | Freq: Three times a day (TID) | ORAL | Status: DC
  Administered 2014-07-06: 30 g via ORAL
  Filled 2014-07-06: qty 45

## 2014-07-06 MED ORDER — DEXTROSE-NACL 5-0.45 % IV SOLN: INTRAVENOUS | Status: DC

## 2014-07-06 MED ORDER — POTASSIUM CHLORIDE 10 MEQ/100ML IV SOLN
10.0000 meq | INTRAVENOUS | Status: AC
  Administered 2014-07-06 (×2): 10 meq via INTRAVENOUS
  Filled 2014-07-06 (×2): qty 100

## 2014-07-06 NOTE — Evaluation (Signed)
Physical Therapy Evaluation Patient Details Name: Miguel Hanson MRN: [id number] DOB: 10-21-1945 Today's Date: 07/06/2014   History of Present Illness  This 69 y.o. male admitted with AMS, decreased ability to communicate and decreased ability to use Rt. UE.  CT and MRI of head wihtout acute findings.  Work up underway.  current diagnosis acute on chronic encephalopathy.  Initially thought to have Wernicke's encephalopathy/korsakoff syndrom, but that diagnosis is in question at time of OT/PT evals.  PMH includes: h/o ETOH abuse, diastolic heart failure, HTN, COPD    Clinical Impression  Patient presents with balance deficits and cognitive deficits - mainly receptive/expressive aphasia and difficulty following commands consistently impacting safe mobility. Pt high falls risk. PT evaluation limited due to pt being taken down for test. Requires Min A for all mobility for safety- might try RW next session. PTA, pt primary caregiver for wife. Pt not safe to return home at this time and would benefit from ST SNF to improve balance, mobility and overall safety awareness.    Follow Up Recommendations SNF    Equipment Recommendations  None recommended by PT    Recommendations for Other Services       Precautions / Restrictions Precautions Precautions: Fall Restrictions Weight Bearing Restrictions: No      Mobility  Bed Mobility Overal bed mobility: Needs Assistance Bed Mobility: Supine to Sit;Sit to Supine     Supine to sit: Min guard Sit to supine: Min guard   General bed mobility comments: min guard to guide pt as he is unable to follow commands consistently. Impulsive to get to EOB.  Transfers Overall transfer level: Needs assistance Equipment used: 1 person hand held assist Transfers: Sit to/from Stand Sit to Stand: Min assist;+2 safety/equipment Stand pivot transfers: Min assist;+2 safety/equipment       General transfer comment: Min A to rise for balance/safety.    Ambulation/Gait Ambulation/Gait assistance: Min assist Ambulation Distance (Feet): 150 Feet Assistive device: 1 person hand held assist Gait Pattern/deviations: Step-through pattern;Staggering right;Staggering left;Scissoring   Gait velocity interpretation: Below normal speed for age/gender General Gait Details: Pt with unsteady gait. Scrissoring noted at times esp with distractions or when following directional commands. Staggering right/left requiring Min A for balance.   Stairs            Wheelchair Mobility    Modified Rankin (Stroke Patients Only)       Balance Overall balance assessment: Needs assistance Sitting-balance support: Feet supported;No upper extremity supported Sitting balance-Leahy Scale: Fair     Standing balance support: During functional activity;Single extremity supported Standing balance-Leahy Scale: Poor Standing balance comment: Relient on UE support (hand held assist).                             Pertinent Vitals/Pain Pain Assessment: Faces Faces Pain Scale: No hurt    Home Living Family/patient expects to be discharged to:: Private residence Living Arrangements: Spouse/significant other (Per chart, pt's wife is w/c bound.)             Home Equipment: Walker - 2 wheels Additional Comments: Pt unable to provide info.  Per chart review spouse is unable to provide assist    Prior Function Level of Independence: Independent with assistive device(s)         Comments: Pt unable to provide info. Above information obtained from chart per MD (spoke with family). Per chart, spouse is w/c bound and has a home health aide to assist  her. Per chart, pt is wife's primary caregiver.     Hand Dominance   Dominant Hand: Right    Extremity/Trunk Assessment   Upper Extremity Assessment: Defer to OT evaluation           Lower Extremity Assessment: Difficult to assess due to impaired cognition;Generalized weakness          Communication   Communication: Receptive difficulties;Expressive difficulties  Cognition Arousal/Alertness: Awake/alert Behavior During Therapy: Flat affect Overall Cognitive Status: Difficult to assess Area of Impairment: Attention;Following commands;Problem solving   Current Attention Level: Sustained;Focused   Following Commands: Follows one step commands inconsistently     Problem Solving: Slow processing;Decreased initiation;Difficulty sequencing;Requires verbal cues;Requires tactile cues General Comments: Pt able to follow ~75% of directional commands (turn Right, left etc). Difficulty following motor commands however able to imitate some movements by therapist.     General Comments General comments (skin integrity, edema, etc.): Assessment limited due to beginning of agitation and pt needing to go down for a procedure.    Exercises        Assessment/Plan    PT Assessment Patient needs continued PT services  PT Diagnosis Difficulty walking   PT Problem List Decreased strength;Decreased cognition;Decreased balance;Decreased mobility;Decreased safety awareness  PT Treatment Interventions Balance training;Gait training;Patient/family education;Functional mobility training;Therapeutic activities;Therapeutic exercise;DME instruction   PT Goals (Current goals can be found in the Care Plan section) Acute Rehab PT Goals Patient Stated Goal: none stated PT Goal Formulation: Patient unable to participate in goal setting Time For Goal Achievement: 07/20/14 Potential to Achieve Goals: Fair    Frequency Min 3X/week   Barriers to discharge Decreased caregiver support Per chart, pt is primary caregiver for spouse.    Co-evaluation PT/OT/SLP Co-Evaluation/Treatment: Yes Reason for Co-Treatment: For patient/therapist safety (Periods of agitation.) PT goals addressed during session: Mobility/safety with mobility;Balance;Strengthening/ROM OT goals addressed during session: ADL's  and self-care;Strengthening/ROM       End of Session Equipment Utilized During Treatment: Gait belt Activity Tolerance: Patient tolerated treatment well Patient left: in bed;with call Hanson/phone within reach;with nursing/sitter in room (transport in room.) Nurse Communication: Mobility status         Time: 9323-5573 PT Time Calculation (min) (ACUTE ONLY): 12 min   Charges:   PT Evaluation $Initial PT Evaluation Tier I: 1 Procedure     PT G CodesCandy Sledge A 09-Jul-2014, 4:20 PM  Wray Kearns, [place], DPT [phone]

## 2014-07-06 NOTE — Progress Notes (Signed)
Subjective: Patient agitated overnight.  No further seizure activity noted.    Objective: Current vital signs: BP 118/73 mmHg  Pulse 72  Temp(Src) 97.6 F (36.4 C) (Oral)  Resp 18  Ht 5\' 7"  (1.702 m)  Wt 58 kg (127 lb 13.9 oz)  BMI 20.02 kg/m2  SpO2 99% Vital signs in last 24 hours: Temp:  [97.5 F (36.4 C)-99.2 F (37.3 C)] 97.6 F (36.4 C) (05/06 0622) Pulse Rate:  [62-79] 72 (05/06 0622) Resp:  [14-24] 18 (05/06 0622) BP: (118-172)/(73-104) 118/73 mmHg (05/06 0622) SpO2:  [95 %-100 %] 99 % (05/06 0622)  Intake/Output from previous day: 05/05 0701 - 05/06 0700 In: 240 [P.O.:240] Out: 900 [Urine:900] Intake/Output this shift:   Nutritional status: Diet regular Room service appropriate?: Yes; Fluid consistency:: Thin  Neurologic Exam: Mental Status: Alert.  Patient continues to perseverate on the word "yes". No fluent speech noted.  Does not follow commands with or without nonverbal cues.    Cranial Nerves: II: Discs flat bilaterally; blinks to threat bilaterally, pupils equal, round, reactive to light and accommodation III,IV, VI: ptosis not present, extra-ocular motions intact bilaterally V,VII: smile symmetric, facial light touch sensation normal bilaterally VIII: hearing normal bilaterally IX,X: uvula rises symmetrically XI: bilateral shoulder shrug XII: unable to perform Motor: Moving all extremities antigravity with full strength.  Deep Tendon Reflexes: 2+ and symmetric throughout UE 1+ bilateral KJ and no AJ   Lab Results: Basic Metabolic Panel:  Recent Labs Lab 07/04/14 1016 07/04/14 1926 07/05/14 0236 07/06/14 0518  NA 138  --  137 137  K 4.7  --  4.3 3.4*  CL 105  --  107 102  CO2 22  --  23 25  GLUCOSE 109*  --  89 90  BUN <5*  --  <5* <5*  CREATININE 0.76  --  0.70 0.65  CALCIUM 9.2  --  8.7* 9.1  MG  --  1.4*  --  1.6*    Liver Function Tests:  Recent Labs Lab 07/04/14 1016  AST 17  ALT 8*  ALKPHOS 60  BILITOT 0.4  PROT 7.3   ALBUMIN 3.3*   No results for input(s): LIPASE, AMYLASE in the last 168 hours.  Recent Labs Lab 07/04/14 1050  AMMONIA 27    CBC:  Recent Labs Lab 07/04/14 1016 07/05/14 0236  WBC 5.9 9.8  NEUTROABS 3.0  --   HGB 13.0 12.9*  HCT 36.1* 36.6*  MCV 91.2 92.0  PLT 199 187    Cardiac Enzymes: No results for input(s): CKTOTAL, CKMB, CKMBINDEX, TROPONINI in the last 168 hours.  Lipid Panel: No results for input(s): CHOL, TRIG, HDL, CHOLHDL, VLDL, LDLCALC in the last 168 hours.  CBG:  Recent Labs Lab 07/04/14 1038  GLUCAP 95    Microbiology: Results for orders placed or performed during the hospital encounter of 07/04/14  MRSA PCR Screening     Status: None   Collection Time: 07/04/14  4:30 PM  Result Value Ref Range Status   MRSA by PCR NEGATIVE NEGATIVE Final    Comment:        The GeneXpert MRSA Assay (FDA approved for NASAL specimens only), is one component of a comprehensive MRSA colonization surveillance program. It is not intended to diagnose MRSA infection nor to guide or monitor treatment for MRSA infections.     Coagulation Studies:  Recent Labs  07/04/14 1016  LABPROT 15.2  INR 1.19    Imaging: Ct Head Wo Contrast  07/04/2014   CLINICAL  DATA:  69 year old male with altered mental status, a not moving right upper extremity. Initial encounter.  EXAM: CT HEAD WITHOUT CONTRAST  TECHNIQUE: Contiguous axial images were obtained from the base of the skull through the vertex without intravenous contrast.  COMPARISON:  Brain MRI 05/08/2014 and earlier.  FINDINGS: Chronic left sphenoid sinusitis. Stable left frontal sinus opacification. Visualized orbits and scalp soft tissues are within normal limits. No acute osseous abnormality identified.  Dominant distal left vertebral artery. Calcified atherosclerosis at the skull base. Stable cerebral volume. No ventriculomegaly. No midline shift, mass effect, or evidence of intracranial mass lesion. Patchy and  confluent white matter hypodensity appears stable. No evidence of cortically based acute infarction identified. No acute intracranial hemorrhage identified.  IMPRESSION: 1. No acute intracranial abnormality. Stable cerebral white matter changes. 2. Chronic left sphenoid and frontal sinusitis.   Electronically Signed   By: Genevie Ann [name]   On: 07/04/2014 11:48   [name] Contrast  07/04/2014   CLINICAL DATA:  Episodic RIGHT upper extremity weakness. Confusion. History of epilepsy. Known ETOH abuse.  EXAM: MRI HEAD WITHOUT CONTRAST  TECHNIQUE: Multiplanar, multiecho pulse sequences of the brain and surrounding structures were obtained without intravenous contrast.  COMPARISON:  CT head earlier today.  MR head 05/08/2014.  FINDINGS: No evidence for acute infarction, hemorrhage, mass lesion, hydrocephalus, or extra-axial fluid. Extensive cerebral and cerebellar atrophy with chronic microvascular ischemic change affecting the periventricular and subcortical white matter. Remote RIGHT pontine lacunar infarct. Pituitary, pineal, and cerebellar tonsils unremarkable. No upper cervical lesions. Flow voids are maintained throughout the carotid, basilar, and vertebral arteries. There are no areas of chronic hemorrhage.  Thin section imaging through the temporal lobes demonstrates no inflammatory or destructive lesion. Extracranial soft tissues grossly unremarkable.  Compared with priors, a similar appearance is noted.  IMPRESSION: Atrophy and small vessel disease. No acute intracranial findings. Stable appearance from priors.   Electronically Signed   By: Rolla Flatten [name]   On: 07/04/2014 15:49    Medications:  I have reviewed the patient's current medications. Scheduled: . enoxaparin (LOVENOX) injection  40 mg Subcutaneous Q24H  . feeding supplement (ENSURE ENLIVE)  237 mL Oral BID BM  . folic acid  1 mg Oral Daily  . levETIRAcetam  1,000 mg Oral BID  . LORazepam  0-4 mg Intravenous Q6H   Followed by  . [START  ON 07/07/2014] LORazepam  0-4 mg Intravenous Q12H  . magnesium oxide  400 mg Oral Daily  . magnesium sulfate 1 - 4 g bolus IVPB  2 g Intravenous Once  . metoprolol tartrate  12.5 mg Oral BID  . multivitamin with minerals  1 tablet Oral Daily  . potassium chloride  40 mEq Oral Once  . sodium chloride  3 mL Intravenous Q12H  . thiamine IV  500 mg Intravenous TID    Assessment/Plan: Patient appears worse today than on yesterday.  Remains on Keppra at 1000mg  BID.  Recommendations: 1.  Continue Keppra at current dose 2.  EEG today   LOS: 2 days   Alexis Goodell, MD [facility] [phone] 07/06/2014  9:27 AM

## 2014-07-06 NOTE — Progress Notes (Addendum)
Subjective: Overnight, he developed agitation response to Ativan, and this morning, his mental status appeared worse than yesterday and was able to follow [place] commands comparably.   Per conversations with his sister Miguel Hanson yesterday, she reports he was in his normal mental state on Monday and described that as the ability to play cards all maintaining a "normal" conversation [discussion regarding family members and their whereabouts]. She also noted that he was able to clean up after they were done playing cards. She feels that after Monday, he may have consumed some amount of alcohol. She reported to me that he purchases his alcohol though sometimes her other younger brother who is also struggling with alcohol abuse tends to supply the patient with alcohol.  This morning, I spoke with his wife Miguel Hanson. She reports to me that she is wheelchair-bound that he is her primary caregiver. She reports that he spends most of his time sitting on the porch and hardly says much. He enjoys going to the convenience store to buy scratch off bingo cards. With regards to his conversations, she reports that he is able to make sense though has become increasingly forgetful. She feels he may have purchased some alcohol on Sunday as she noted that he had changed the tone of his voice and was not making sense in what he was saying to her. He was also noted to be staring off into space. She reports having a home health aide who comes in to check on her daily, and the aide noticed that her husband was acting strangely prior to coming into the hospital. She feels that this is an acute change from his baseline. He is able to ambulate without difficulty at home with his walker. Though his symptoms on admission were similar to the ones that brought him to the hospital in March, she reports that they were milder than in March. Overall, she does not believe that he can care for himself at home and thinks that he needs to be  placed. Her son is her healthcare power of attorney and will be coming to the hospital later today  Per his pharmacy, he last filled [place] and folate on 06/05/14 though had not filled magnesium or thiamine.  Objective: Vital signs in last 24 hours: Filed Vitals:   07/05/14 1800 07/05/14 1841 07/05/14 2115 07/06/14 0622  BP: 172/95 160/84 160/104 118/73  Pulse: 77 79 74 72  Temp:  98.2 F (36.8 C) 97.5 F (36.4 C) 97.6 F (36.4 C)  TempSrc:  Axillary Oral Oral  Resp: 15 16 20 18   Height:      Weight:      SpO2: 99% 100% 100% 99%   Weight change:   Intake/Output Summary (Last 24 hours) at 07/06/14 1101 Last data filed at 07/05/14 1700  Gross per 24 hour  Intake      0 ml  Output    700 ml  Net   -700 ml   General: Elderly Afro-American male, sleepy, NAD HEENT: PERRL, EOMI, no scleral icterus, unable to visualize oropharynx Cardiac: RRR, no rubs, murmurs or gallops Pulm: Clear breath sounds noted on anterior lung auscultation Abd: soft, nontender, nondistended, BS present Ext: warm and well perfused, no pedal or tibial edema Neuro: moving all extremities freely, responds to questions with "yes", able to follow less commands today in cooperating with neurologic exam  Lab Results: Basic Metabolic Panel:  Recent Labs Lab 07/04/14 1926 07/05/14 0236 07/06/14 0518  NA  --  137  137  K  --  4.3 3.4*  CL  --  107 102  CO2  --  23 25  GLUCOSE  --  89 90  BUN  --  <5* <5*  CREATININE  --  0.70 0.65  CALCIUM  --  8.7* 9.1  MG 1.4*  --  1.6*   Liver Function Tests:  Recent Labs Lab 07/04/14 1016  AST 17  ALT 8*  ALKPHOS 60  BILITOT 0.4  PROT 7.3  ALBUMIN 3.3*    Recent Labs Lab 07/04/14 1050  AMMONIA 27   CBC:  Recent Labs Lab 07/04/14 1016 07/05/14 0236  WBC 5.9 9.8  NEUTROABS 3.0  --   HGB 13.0 12.9*  HCT 36.1* 36.6*  MCV 91.2 92.0  PLT 199 187   CBG:  Recent Labs Lab 07/04/14 1038  GLUCAP 95   Coagulation:  Recent Labs Lab  07/04/14 1016  LABPROT 15.2  INR 1.19   Urinalysis:  Recent Labs Lab 07/04/14 1044  COLORURINE YELLOW  LABSPEC 1.013  PHURINE 6.5  GLUCOSEU NEGATIVE  HGBUR NEGATIVE  BILIRUBINUR NEGATIVE  KETONESUR NEGATIVE  PROTEINUR NEGATIVE  UROBILINOGEN 0.2  NITRITE NEGATIVE  LEUKOCYTESUR NEGATIVE    Micro Results: Recent Results (from the past 240 hour(s))  MRSA PCR Screening     Status: None   Collection Time: 07/04/14  4:30 PM  Result Value Ref Range Status   MRSA by PCR NEGATIVE NEGATIVE Final    Comment:        The GeneXpert MRSA Assay (FDA approved for NASAL specimens only), is one component of a comprehensive MRSA colonization surveillance program. It is not intended to diagnose MRSA infection nor to guide or monitor treatment for MRSA infections.    Studies/Results: Ct Head Wo Contrast  07/04/2014   CLINICAL DATA:  69 year old male with altered mental status, a not moving right upper extremity. Initial encounter.  EXAM: CT HEAD WITHOUT CONTRAST  TECHNIQUE: Contiguous axial images were obtained from the base of the skull through the vertex without intravenous contrast.  COMPARISON:  Brain MRI 05/08/2014 and earlier.  FINDINGS: Chronic left sphenoid sinusitis. Stable left frontal sinus opacification. Visualized orbits and scalp soft tissues are within normal limits. No acute osseous abnormality identified.  Dominant distal left vertebral artery. Calcified atherosclerosis at the skull base. Stable cerebral volume. No ventriculomegaly. No midline shift, mass effect, or evidence of intracranial mass lesion. Patchy and confluent white matter hypodensity appears stable. No evidence of cortically based acute infarction identified. No acute intracranial hemorrhage identified.  IMPRESSION: 1. No acute intracranial abnormality. Stable cerebral white matter changes. 2. Chronic left sphenoid and frontal sinusitis.   Electronically Signed   By: Genevie Ann [name]   On: 07/04/2014 11:48   Mr Brain  Wo Contrast  07/04/2014   CLINICAL DATA:  Episodic RIGHT upper extremity weakness. Confusion. History of epilepsy. Known ETOH abuse.  EXAM: MRI HEAD WITHOUT CONTRAST  TECHNIQUE: Multiplanar, multiecho pulse sequences of the brain and surrounding structures were obtained without intravenous contrast.  COMPARISON:  CT head earlier today.  MR head 05/08/2014.  FINDINGS: No evidence for acute infarction, hemorrhage, mass lesion, hydrocephalus, or extra-axial fluid. Extensive cerebral and cerebellar atrophy with chronic microvascular ischemic change affecting the periventricular and subcortical white matter. Remote RIGHT pontine lacunar infarct. Pituitary, pineal, and cerebellar tonsils unremarkable. No upper cervical lesions. Flow voids are maintained throughout the carotid, basilar, and vertebral arteries. There are no areas of chronic hemorrhage.  Thin section imaging through the temporal lobes  demonstrates no inflammatory or destructive lesion. Extracranial soft tissues grossly unremarkable.  Compared with priors, a similar appearance is noted.  IMPRESSION: Atrophy and small vessel disease. No acute intracranial findings. Stable appearance from priors.   Electronically Signed   By: Rolla Flatten [name]   On: 07/04/2014 15:49   Medications: I have reviewed the patient's current medications. Scheduled Meds: . enoxaparin (LOVENOX) injection  40 mg Subcutaneous Q24H  . feeding supplement (ENSURE ENLIVE)  237 mL Oral BID BM  . folic acid  1 mg Intravenous Daily  . lactulose  30 g Oral TID  . levETIRAcetam  1,000 mg Intravenous Q12H  . LORazepam  0-4 mg Intravenous Q6H   Followed by  . [START ON 07/07/2014] LORazepam  0-4 mg Intravenous Q12H  . magnesium oxide  400 mg Oral Daily  . magnesium sulfate 1 - 4 g bolus IVPB  2 g Intravenous Once  . metoprolol tartrate  12.5 mg Oral BID  . multivitamin with minerals  1 tablet Oral Daily  . potassium chloride  10 mEq Intravenous Q1 Hr x 4  . sodium chloride  3 mL  Intravenous Q12H  . [START ON 07/07/2014] thiamine IV  250 mg Intravenous Daily  . thiamine IV  500 mg Intravenous TID   Continuous Infusions:  PRN Meds:.clonazePAM, LORazepam **OR** LORazepam Assessment/Plan:  Miguel Hanson is a 69 year old male with history of polysubstance abuse [alcohol, marijuana], history of COPD, seizure disorder, hypertension, diastolic heart failure, history of PTSD, severe protein calorie malnutrition hospitalized for acute on chronic encephalopathy.  Acute on chronic encephalopathy: On admission, it was thought that he may have been postictal from nonadherence to treatment and/or alcohol withdrawal seizures superimposed upon Wernicke encephalopathy/Korsakoff syndrome. However, he should no longer be postictal if that were the case, and report from caregivers suggest that he had been taking his medication as corroborated by pharmacy records. Obvious expressive aphasia noted which certainly limits our assessment of possible Korsakoff psychosis. Also questionable if he truly has Wernicke encephalopathy as multiple providers have noted and otherwise nonfocal eye exam, did not appear to have gait ataxia with ambulation assisted by nursing as he went to the bathroom today. Certainly, he does have "confusion" as this is a change from the baseline reported by caregivers. TSH reassuring on admission. Electrolytes grossly stable from yesterday making metabolic causes less likely. No recent RPR, HIV, workup for cirrhosis, especially since it was thought that he may have had hepatic encephalopathy during his hospitalization in December 2015. B12/folate reassuring in March. Per [employer], he refilled his Klonopin on 4/5, though has not been receiving her taking it for prolonged period of time which would be concerning for benzodiazepine withdrawal. His 2 most recent brain MRIs are notable for cerebellar atrophy which may be contributory to his expressive aphasia, especially with  temporal lobe atrophy. His most recent hospitalization was complicated by severe alcohol withdrawal and should be noted that he is at high risk for withdrawing at this time as well. -Switch Keppra 1000 mg twice daily back to IV -Continue thiamine 500 mg IV TID for empiric Wernicke encephalopathy treatment [Day 2/3] -Switch multivitamin, folate to IV -Check abdominal ultrasound to assess for cirrhotic changes -Give lactulose 30 g 3 times daily for empiric hepatic encephalopathy treatment -Continue sitter -Follow-up EEG per neurology recommendations -Neurology following, appreciate recommendations  Polysubstance abuse [alcohol, marijuana]: UDS on admission notable for marijuana, and he does have an extensive history of alcohol abuse. Per multiple caregivers, it appears that  he is purchasing his own alcohol though may also be receiving it from other family members. CIWA scores peaked at 9 overnight was 5 most recently. -Continue CIWA protocol -Speak to caregivers  Prolonged QTC: Resolved on EKG yesterday. Mg 1.6 after 2 g yesterday. -Give 2g Mg IV now and recheck  Seizure disorder: Likely secondary to alcohol abuse in the absence of other causes. Most recent head imaging without findings that could be contributory. -EEG as noted above -Continue seizure precautions  Diastolic heart failure: EF 55-60% with grade 1 diastolic dysfunction as noted on echo 05/08/14. -Continue to monitor  History of macrocytic anemia: Hemoglobin 12.9, MCV 92 yesterday, baseline mostly 10-12 with MCV trending greater than 100 over the past year. -Continue to follow CBC  History of COPD: Tobacco abuse noted on prior admissions though no PFTs can be found on file. -Continue to monitor  Severe protein calorie malnutrition: Continue Ensure Complete as he can tolerate  History of PTSD: Noted to be secondary to his experience in the [place] War in an admission note dated 11/22/10. Previously on SSRI therapy though none  found on current home medication. -Discontinue Klonopin 1 mg twice daily for now as he is receiving Ativan with CIWA  Hypertension: Continue home metoprolol 12.5 mg twice daily.  #FEN:  -Diet: Regular -Give potassium 10 mEq IV 4 for potassium 3.4 -D5-NS x 12 hours  #DVT prophylaxis: Lovenox  #CODE STATUS: FULL CODE  Dispo: Disposition is deferred at this time, awaiting improvement of current medical problems.   -Consult social work for disposition  The patient does not know have a current PCP [facility]) and does not know need an [facility] hospital follow-up appointment after discharge.  The patient does have transportation limitations that hinder transportation to clinic appointments.  .Services Needed at time of discharge: Y = Yes, Blank = No PT:   OT:   RN:   Equipment:   Other:     LOS: 2 days   Miguel Dubin, MD 07/06/2014, 11:01 AM

## 2014-07-06 NOTE — Clinical Social Work Note (Signed)
CSW consult acknowledged:  Clinical Social Worker received a consult for "current substance abuse" and to assess living situation. CSW noted that patient has poor cognition and is only oriented to person. Patient unable to participate in full psychosocial assessment regarding substance abuse. CSW also contacted pt's spouse, [name] to assess living situation however unable to leave voice message for a returned phone call.  CSW will continue to contact pt's wife, [name] to assess living situation only.    Glendon Axe, MSW, LCSWA [phone] 07/06/2014 2:40 PM

## 2014-07-06 NOTE — Progress Notes (Signed)
EEG completed, results pending. 

## 2014-07-06 NOTE — Evaluation (Signed)
Occupational Therapy Evaluation Patient Details Name: Miguel Hanson MRN: [id number] DOB: [birth year] Today's Date: 07/06/2014    History of Present Illness This 69 y.o. male admitted with AMS, decreased ability to communicate and decreased ability to use Rt. UE.  CT and MRI of head wihtout acute findings.  Work up underway.  current diagnosis acute on chronic encephalopathy.  Initially thought to have Wernicke's encephalopathy/korsakoff syndrom, but that diagnosis is in question at time of OT/PT evals.  PMH includes: h/o ETOH abuse, diastolic heart failure, HTN, COPD   Clinical Impression   Pt admitted with above. He demonstrates the below listed deficits and will benefit from continued OT to maximize safety and independence with BADLs.  Eval limited due pt taken for procedure.  He was able to intermittently follow commands.  He followed directional "turn left/right" with ~70% accuracy.  He was less consistent with motor commands.  He is able to identify use of toothbrush and grooming objects.  He currently requires max - total A for ADLs and min A for functional mobility.  He will require SNF level rehab at discharge.       Follow Up Recommendations  SNF    Equipment Recommendations  None recommended by OT    Recommendations for Other Services       Precautions / Restrictions Precautions Precautions: Fall      Mobility Bed Mobility Overal bed mobility: Needs Assistance Bed Mobility: Sit to Supine;Supine to Sit     Supine to sit: Min guard Sit to supine: Min guard   General bed mobility comments: min guard to guide pt as he is unable to follow commands consistently   Transfers Overall transfer level: Needs assistance   Transfers: Sit to/from Stand;Stand Pivot Transfers Sit to Stand: Min assist;+2 safety/equipment Stand pivot transfers: Min assist;+2 safety/equipment            Balance Overall balance assessment: Needs assistance Sitting-balance support: Feet  supported Sitting balance-Leahy Scale: Fair     Standing balance support: Single extremity supported Standing balance-Leahy Scale: Poor Standing balance comment: reliant on UE support                             ADL Overall ADL's : Needs assistance/impaired Eating/Feeding: Total assistance;Sitting Eating/Feeding Details (indicate cue type and reason): Pt did not spontaneously attetmpt self feeding  Grooming: Oral care;Moderate assistance Grooming Details (indicate cue type and reason): Pt is able to demonstrate correct use of toothbrush, but requires cues and assist  for sequencing of task  Upper Body Bathing: Total assistance;Sitting   Lower Body Bathing: Total assistance;Sit to/from stand   Upper Body Dressing : Total assistance;Sitting   Lower Body Dressing: Total assistance;Sit to/from stand   Toilet Transfer: Minimal assistance;Ambulation;Regular Toilet;RW   Toileting- Clothing Manipulation and Hygiene: Total assistance;Sit to/from stand       Functional mobility during ADLs: Minimal assistance General ADL Comments: Pt unable to sustain attention to engage in ADLs      Vision Additional Comments: Pt unable to particiapte in formal visual assessment    Perception     Praxis      Pertinent Vitals/Pain Pain Assessment: Faces Faces Pain Scale: No hurt     Hand Dominance Right   Extremity/Trunk Assessment Upper Extremity Assessment Upper Extremity Assessment: Generalized weakness;Difficult to assess due to impaired cognition (difficult to fully assess due to communication and cognitive)   Lower Extremity Assessment Lower Extremity Assessment: Defer to PT evaluation  Communication Communication Communication: Receptive difficulties;Expressive difficulties   Cognition Arousal/Alertness: Awake/alert Behavior During Therapy: Flat affect Overall Cognitive Status: Difficult to assess Area of Impairment: Attention;Following commands;Problem  solving   Current Attention Level: Sustained;Focused   Following Commands: Follows one step commands inconsistently     Problem Solving: Slow processing;Decreased initiation;Difficulty sequencing;Requires verbal cues;Requires tactile cues General Comments: Pt with communication deficits making accurate evaluation of cognition difficult.  Pt followed 75% of directional (Lt and Rt instructions while ambulating).  He attempted to follow motor commands with UE, but unable to complete command.  He will initiate the command .  Verbal perseveration noted.    General Comments       Exercises       Shoulder Instructions      Home Living Family/patient expects to be discharged to:: Private residence Living Arrangements: Alone                               Additional Comments: Pt unable to provide info.  Per chart review spouse is unable to provide assist      Prior Functioning/Environment Level of Independence: Independent with assistive device(s)        Comments: info gleaned per chart review as pt unable to provide info.  Per chart, pt's wife uses w/c for mobility and pt is her primary caregiver     OT Diagnosis: Generalized weakness;Cognitive deficits;Disturbance of vision   OT Problem List: Decreased strength;Decreased activity tolerance;Impaired balance (sitting and/or standing);Impaired vision/perception;Decreased coordination;Decreased cognition;Decreased safety awareness;Decreased knowledge of use of DME or AE   OT Treatment/Interventions: Self-care/ADL training;DME and/or AE instruction;Therapeutic activities;Cognitive remediation/compensation;Visual/perceptual remediation/compensation;Patient/family education;Balance training    OT Goals(Current goals can be found in the care plan section) Acute Rehab OT Goals OT Goal Formulation: Patient unable to participate in goal setting Time For Goal Achievement: 07/20/14 Potential to Achieve Goals: Fair ADL Goals Pt  Will Perform Eating: with supervision;sitting Pt Will Perform Grooming: with min assist;standing Pt Will Perform Upper Body Bathing: with min assist;sitting Pt Will Perform Lower Body Bathing: with mod assist;sit to/from stand Pt Will Transfer to Toilet: with min guard assist;ambulating;regular height toilet;bedside commode;grab bars Pt Will Perform Toileting - Clothing Manipulation and hygiene: with mod assist;sit to/from stand  OT Frequency: Min 2X/week   Barriers to D/C: Decreased caregiver support          Co-evaluation PT/OT/SLP Co-Evaluation/Treatment: Yes Reason for Co-Treatment: For patient/therapist safety (Pt with periods of agitation per chart )   OT goals addressed during session: ADL's and self-care;Strengthening/ROM      End of Session Nurse Communication: Mobility status  Activity Tolerance: Patient tolerated treatment well Patient left: in bed;with call bell/phone within reach;with nursing/sitter in room   Time: 1340-1352 OT Time Calculation (min): 12 min Charges:  OT General Charges $OT Visit: 1 Procedure OT Evaluation $Initial OT Evaluation Tier I: 1 Procedure G-Codes:    Lucille Passy M 08-04-14, 3:44 PM

## 2014-07-06 NOTE — Clinical Social Work Note (Deleted)
Clinical Social Worker facilitated patient discharge including contacting patient family and facility to confirm patient discharge plans.  Clinical information faxed to facility and family agreeable with plan.  CSW arranged ambulance transport via [facility] to [facility].  RN to call report prior to discharge.  Pt's dtr, Jenny Reichmann requesting for pt to receive bath and follow up with PT before departing. CSW notified bedside RN. DC packet prepared and on chart for transport with number for report.   Clinical Social Worker will sign off for now as social work intervention is no longer needed. Please consult [place] again if new need arises.  Glendon Axe, MSW, LCSWA [phone] 07/06/2014 10:26 AM

## 2014-07-06 NOTE — Progress Notes (Signed)
IMTS Night Float Progress Note  Patient intermittently agitated and confused. IV pulled out. Upon interview and exam, patient is back in bed and without significant agitation. No significant tremors on exam and no significant tachycardia to suggest significant alcohol withdrawal symptoms at this point, though patient is moderately hypertensive. Would question some degree of possible delirium in addition to alcohol withdrawal. Patient received 2 mg lorazepam at 0245 which was soon followed by increased agitation, which could also bring up the possibility for a paradoxical reaction to the ativan.  -Sitter at bedside.  -Will hold on additional ativan right now given possibility of paradoxical reaction.  -Will hold on haldol given prolonged QT  Luan Moore, MD, PhD 07/06/14 4:11AM

## 2014-07-06 NOTE — Progress Notes (Signed)
Patient up out of bed, confused, trying to walk out of room.Tremors noted as well. Multiple attempts were made to get patient back in bed but patient began to get physical with nurses. 2 mg of Ativan given to patient. RN still in room charting when patient attempts to get out of bed again. IV now ripped out of patients hand. Nurse tech arrived in room to help console patient. Patient still very agitated and not willing to get in bed. MD paged to obtain order for Ativan IM. Orders given. Patient cleaned up, back in bed with bed alarm. Nurse tech currently in room, sitting with him until he is calm and safe. Burnell Blanks, RN

## 2014-07-06 NOTE — Procedures (Signed)
ELECTROENCEPHALOGRAM REPORT  Date of Study: 07/06/2014  Patient's Name: Miguel Hanson MRN: [id number] Date of Birth: Apr 28, 1945  Referring Provider: Dr. Alexis Goodell  Clinical History: This is a 69 year old man with altered mental status.   Medications: levETIRAcetam (KEPPRA) 1,000 mg in sodium chloride 0.9 % 100 mL IVPB clonazePAM (KLONOPIN) tablet 1 mg Ativan given throughout the night for agitation  folic acid injection 1 mg thiamine (B-1) injection 250 mg  Technical Summary: A multichannel digital EEG recording measured by the international 10-20 system with electrodes applied with paste and impedances below 5000 ohms performed as portable with EKG monitoring in an unresponsive and predominantly asleep patient.  Hyperventilation and photic stimulation were not performed.  The digital EEG was referentially recorded, reformatted, and digitally filtered in a variety of bipolar and referential montages for optimal display.   Description: The patient is unresponsive and predominantly asleep during the recording. There is no clear posterior dominant rhythm. The background consists of a large amount of diffuse theta and occasional delta slowing. Poorly formed sleep spindles are seen. There is additional focal 4-5 Hz slowing seen over the left posterior quadrant regions, occurring in a periodic pattern every 1 second, at times admixed with low to medium voltage sharp waves maximal over the left posterior temporal region (periodic lateralized discharges), at times with spread to the right posterior quadrant. These do not evolve in frequency or amplitude. There were no electrographic seizures seen.  EKG lead was unremarkable.  Impression: This asleep EEG is abnormal due to the presence of: 1. Moderate diffuse slowing of the background 2. Additional focal slowing over the posterior quadrant regions, left greater than right 3. Periodic lateralized discharges in the left posterior quadrant,  maximal over the left posterior temporal region  Clinical Correlation of the above findings indicates bilateral cerebral dysfunction, left greater than right, that is non-specific in etiology and can be seen with hypoxic/ischemic injury, toxic/metabolic encephalopathies, post-ictal, or medication effect. Although lateralized periodic discharges do not represent ongoing seizure activity, they are commonly associated with an acute brain lesion and clinical focal seizures, or post-ictal after focal status epilepticus. There were no electrographic seizures in this study.   Ellouise Newer, [name]

## 2014-07-06 NOTE — Progress Notes (Signed)
  Date: 07/06/2014  Patient name: Peirce Deveney  Medical record number: [id number]  Date of birth: 1945-05-02   This patient's plan of care was discussed with the house staff. Please see Dr. Serita Grit note for complete details. I concur with his findings.  Dr. Serita Grit note has a thorough discussion about Mr. Kistler' differential diagnoses.  We will assess for cirrhotic changes to the liver given his ETOH use.  EEG is pending for today.  Trial of lactulose.  Continue Keppra.  Switch meds to IV as he is not tolerating PO at this time.    Sid Falcon, MD 07/06/2014, 3:38 PM

## 2014-07-07 ENCOUNTER — Encounter (HOSPITAL_COMMUNITY): Payer: Self-pay | Admitting: General Practice

## 2014-07-07 DIAGNOSIS — F1010 Alcohol abuse, uncomplicated: Secondary | ICD-10-CM

## 2014-07-07 DIAGNOSIS — E43 Unspecified severe protein-calorie malnutrition: Secondary | ICD-10-CM

## 2014-07-07 LAB — BASIC METABOLIC PANEL
Anion gap: 11 (ref 5–15)
CO2: 22 mmol/L (ref 22–32)
Calcium: 8.9 mg/dL (ref 8.9–10.3)
Chloride: 101 mmol/L (ref 101–111)
Creatinine, Ser: 0.73 mg/dL (ref 0.61–1.24)
GFR calc Af Amer: >60 mL/min (ref >60)
GFR calc non Af Amer: >60 mL/min (ref >60)
GLUCOSE: 117 mg/dL — AB (ref 70–99)
Potassium: 3.8 mmol/L (ref 3.5–5.1)
Sodium: 134 mmol/L — ABNORMAL LOW (ref 135–145)

## 2014-07-07 LAB — MAGNESIUM: Magnesium: 1.7 mg/dL (ref 1.7–2.4)

## 2014-07-07 LAB — HIV ANTIBODY (ROUTINE TESTING W REFLEX): HIV SCREEN 4TH GENERATION: NONREACTIVE

## 2014-07-07 LAB — RPR: RPR Ser Ql: NONREACTIVE

## 2014-07-07 MED ORDER — LACOSAMIDE 50 MG PO TABS
50.0000 mg | ORAL_TABLET | Freq: Two times a day (BID) | ORAL | Status: DC
  Administered 2014-07-07 – 2014-07-09 (×5): 50 mg via ORAL
  Filled 2014-07-07 (×5): qty 1

## 2014-07-07 MED ORDER — SENNOSIDES-DOCUSATE SODIUM 8.6-50 MG PO TABS
2.0000 | ORAL_TABLET | Freq: Two times a day (BID) | ORAL | Status: AC
  Administered 2014-07-07 – 2014-07-08 (×2): 2 via ORAL
  Filled 2014-07-07 (×2): qty 2

## 2014-07-07 MED ORDER — SODIUM CHLORIDE 0.9 % IV SOLN
100.0000 mg | Freq: Once | INTRAVENOUS | Status: AC
  Administered 2014-07-07: 100 mg via INTRAVENOUS
  Filled 2014-07-07: qty 10

## 2014-07-07 MED ORDER — MAGNESIUM SULFATE 2 GM/50ML IV SOLN
2.0000 g | Freq: Once | INTRAVENOUS | Status: AC
  Administered 2014-07-07: 2 g via INTRAVENOUS
  Filled 2014-07-07: qty 50

## 2014-07-07 MED ORDER — POLYETHYLENE GLYCOL 3350 17 G PO PACK
17.0000 g | PACK | Freq: Every day | ORAL | Status: AC
  Administered 2014-07-08: 17 g via ORAL
  Filled 2014-07-07: qty 1

## 2014-07-07 NOTE — Progress Notes (Signed)
  PROGRESS NOTE MEDICINE TEACHING ATTENDING   Day 3 of stay Patient name: Miguel Hanson   Medical record number: [id number] Date of birth: 08/12/1945   Met with the patient with Dr Ronnald Ramp. He appears to be doing better today. He was more vocal and was following commands.  Blood pressure 143/91, pulse 89, temperature 98.2 F (36.8 C), temperature source Oral, resp. rate 20, height 5' 7" (1.702 m), weight 127 lb 13.9 oz (58 kg), SpO2 99 %. The patient is alert and oriented, comfortable, in no acute distress. PERRL, EOMI. Heart exhibits regular rate and rhythm, no murmurs. Lungs are clear to auscultation. Abdomen is soft and non-tender. There is no pedal edema and good pedal pulses. There are no gross focal neurological deficits apparent on neurological exam except that the patient does not answer questions properly, and does not make a meaningful conversation, however he was doing much better today.  Assessment/Plan Presentation might represent alcohol withdrawal post ictal state. The patient has presented similarly in the past, when he was worked up for stroke with negative results for acute pathology. This time around as well, his brain MRI has been clear. He is on keppra and has been started on vimpat today. His mental status is clinically improving. He is eating today, and has been able to swallow his pills. We would continue to monitor his CIWA scores and follow neurology recommendations.   I have discussed the care of this patient with my IM team residents. Please see the resident note for details.  [place], Hunker 07/07/2014, 11:06 AM.

## 2014-07-07 NOTE — Progress Notes (Signed)
Subjective: Patient has been agitated overnight.  Ativan has been given.  More cooperative this morning.    Objective: Current vital signs: BP 120/77 mmHg  Pulse 81  Temp(Src) 99 F (37.2 C) (Axillary)  Resp 19  Ht 5\' 7"  (1.702 m)  Wt 58 kg (127 lb 13.9 oz)  BMI 20.02 kg/m2  SpO2 96% Vital signs in last 24 hours: Temp:  [98.1 F (36.7 C)-99.9 F (37.7 C)] 99 F (37.2 C) (05/07 0428) Pulse Rate:  [78-100] 81 (05/07 0428) Resp:  [17-20] 19 (05/07 0428) BP: (107-149)/(77-104) 120/77 mmHg (05/07 0428) SpO2:  [96 %-100 %] 96 % (05/07 0428)  Intake/Output from previous day: 05/06 0701 - 05/07 0700 In: 663 [P.O.:660; I.V.:3] Out: 800 [Urine:800] Intake/Output this shift: Total I/O In: 240 [P.O.:240] Out: 100 [Urine:100] Nutritional status: Diet regular Room service appropriate?: Yes; Fluid consistency:: Thin  Neurologic Exam: Mental Status: Alert.  Some fluent speech noted but remains aphasic for the most part.  Able to follow some commands with nonverbal cues.   Cranial Nerves: II: Discs flat bilaterally; blinks to bilateral confrontation, pupils equal, round, reactive to light and accommodation III,IV, VI: ptosis not present, extra-ocular motions intact bilaterally V,VII: smile symmetric, facial light touch sensation normal bilaterally VIII: hearing normal bilaterally IX,X: gag reflex present XI: bilateral shoulder shrug XII: midline tongue extension Motor: Lifts and maintains all extremities against gravity Sensory: Pinprick and light touch intact throughout, bilaterally Deep Tendon Reflexes: 2+ in the upper extremities, 1+ at the knees and absent at the ankles   Lab Results: Basic Metabolic Panel:  Recent Labs Lab 07/04/14 1016 07/04/14 1926 07/05/14 0236 07/06/14 0518  NA 138  --  137 137  K 4.7  --  4.3 3.4*  CL 105  --  107 102  CO2 22  --  23 25  GLUCOSE 109*  --  89 90  BUN <5*  --  <5* <5*  CREATININE 0.76  --  0.70 0.65  CALCIUM 9.2  --  8.7* 9.1   MG  --  1.4*  --  1.6*    Liver Function Tests:  Recent Labs Lab 07/04/14 1016  AST 17  ALT 8*  ALKPHOS 60  BILITOT 0.4  PROT 7.3  ALBUMIN 3.3*   No results for input(s): LIPASE, AMYLASE in the last 168 hours.  Recent Labs Lab 07/04/14 1050  AMMONIA 27    CBC:  Recent Labs Lab 07/04/14 1016 07/05/14 0236  WBC 5.9 9.8  NEUTROABS 3.0  --   HGB 13.0 12.9*  HCT 36.1* 36.6*  MCV 91.2 92.0  PLT 199 187    Cardiac Enzymes: No results for input(s): CKTOTAL, CKMB, CKMBINDEX, TROPONINI in the last 168 hours.  Lipid Panel: No results for input(s): CHOL, TRIG, HDL, CHOLHDL, VLDL, LDLCALC in the last 168 hours.  CBG:  Recent Labs Lab 07/04/14 1038  GLUCAP 95    Microbiology: Results for orders placed or performed during the hospital encounter of 07/04/14  MRSA PCR Screening     Status: None   Collection Time: 07/04/14  4:30 PM  Result Value Ref Range Status   MRSA by PCR NEGATIVE NEGATIVE Final    Comment:        The GeneXpert MRSA Assay (FDA approved for NASAL specimens only), is one component of a comprehensive MRSA colonization surveillance program. It is not intended to diagnose MRSA infection nor to guide or monitor treatment for MRSA infections.     Coagulation Studies:  Recent Labs  07/04/14  1016  LABPROT 15.2  INR 1.19    Imaging: US Abdomen Complete  07/06/2014   CLINICAL DATA:  Acute encephalopathy.  EXAM: ULTRASOUND ABDOMEN COMPLETE  COMPARISON:  Plain film 12/11/2012  FINDINGS: Gallbladder: No gallstones or wall thickening visualized. No sonographic Murphy sign noted.  Common bile duct: Diameter: Normal, 4 mm.  Liver: No gross abnormality identified. No definite cirrhosis. Normal in echotexture.  IVC: Not well visualized.  Pancreas: Obscured/ not visualized.  Spleen: Size and appearance within normal limits.  Right Kidney: Length: 10.1 cm. Echogenicity within normal limits. No mass or hydronephrosis visualized.  Left Kidney: Length:  10.0 cm. Echogenicity within normal limits. No mass or hydronephrosis visualized.  Abdominal aorta: No aneurysm identified. The bifurcation is obscured.  Other findings: No ascites.  Exam is degraded by patient clinical status. Unable to follow directions and hold breath.  IMPRESSION: 1. Decreased sensitivity and specificity exam due to technique related factors, as described above. 2. Given this factor, no acute process in the abdomen.   Electronically Signed   By: Abigail Miyamoto [name]   On: 07/06/2014 14:53    Medications:  I have reviewed the patient's current medications. Scheduled: . enoxaparin (LOVENOX) injection  40 mg Subcutaneous Q24H  . feeding supplement (ENSURE ENLIVE)  237 mL Oral BID BM  . folic acid  1 mg Intravenous Daily  . levETIRAcetam  1,000 mg Intravenous Q12H  . LORazepam  0-4 mg Intravenous Q6H   Followed by  . LORazepam  0-4 mg Intravenous Q12H  . magnesium oxide  400 mg Oral Daily  . metoprolol tartrate  12.5 mg Oral BID  . multivitamin with minerals  1 tablet Oral Daily  . sodium chloride  3 mL Intravenous Q12H  . thiamine IV  250 mg Intravenous Daily  . thiamine IV  500 mg Intravenous TID    Assessment/Plan: Patient improved from yesterday but not yet at baseline.  Some of his presentation may be ETOH withdrawal.  EEG shows left posterior temporal PLEDS.  No clinical seizure activity noted.  Patient on Keppra.  Remains IV since it has been a challenge to get patient to take po at times.    Recommendations: 1.  Due to mental status would not like to increase Keppra any further.  Would start Vimpat at 50mg  BID.  Would give first dose IV at 100mg  and continue remaining doses po.  Since pill is smaller may be easier to administer.   2.  Continue Keppra at current dose. 3.  Agree with continued CIWA   LOS: 3 days   Alexis Goodell, MD [facility] [phone] 07/07/2014  8:24 AM

## 2014-07-07 NOTE — Progress Notes (Signed)
Subjective: Improved mentation today, following commands, answering some questions appropriately. Denies pain, strength intact bilaterally.   Objective: Vital signs in last 24 hours: Filed Vitals:   07/06/14 1712 07/06/14 2026 07/06/14 2357 07/07/14 0428  BP: 148/100 149/104 132/90 120/77  Pulse: 90 100 89 81  Temp: 98.2 F (36.8 C) 99.5 F (37.5 C) 99.9 F (37.7 C) 99 F (37.2 C)  TempSrc: Oral Oral Axillary Axillary  Resp: 19 20 17 19   Height:      Weight:      SpO2: 97% 99% 97% 96%   Weight change:   Intake/Output Summary (Last 24 hours) at 07/07/14 0643 Last data filed at 07/07/14 0546  Gross per 24 hour  Intake    663 ml  Output    800 ml  Net   -137 ml   Physical Exam: General: Thin appearing AA male, alert, cooperative, NAD. Not oriented.  HEENT: PERRL, EOMI. Moist mucus membranes Neck: Full range of motion without pain, supple, no lymphadenopathy or carotid bruits Lungs: Clear to ascultation bilaterally, normal work of respiration, no wheezes, rales, rhonchi Heart: RRR, no murmurs, gallops, or rubs Abdomen: Soft, non-tender, non-distended, BS + Extremities: No cyanosis, clubbing, or edema Neurologic: Alert, not oriented, cranial nerves II-XII intact, strength grossly intact, sensation intact to light touch    Lab Results: Basic Metabolic Panel:  Recent Labs Lab 07/04/14 1926 07/05/14 0236 07/06/14 0518  NA  --  137 137  K  --  4.3 3.4*  CL  --  107 102  CO2  --  23 25  GLUCOSE  --  89 90  BUN  --  <5* <5*  CREATININE  --  0.70 0.65  CALCIUM  --  8.7* 9.1  MG 1.4*  --  1.6*   Liver Function Tests:  Recent Labs Lab 07/04/14 1016  AST 17  ALT 8*  ALKPHOS 60  BILITOT 0.4  PROT 7.3  ALBUMIN 3.3*    Recent Labs Lab 07/04/14 1050  AMMONIA 27   CBC:  Recent Labs Lab 07/04/14 1016 07/05/14 0236  WBC 5.9 9.8  NEUTROABS 3.0  --   HGB 13.0 12.9*  HCT 36.1* 36.6*  MCV 91.2 92.0  PLT 199 187   CBG:  Recent Labs Lab  07/04/14 1038  GLUCAP 95   Coagulation:  Recent Labs Lab 07/04/14 1016  LABPROT 15.2  INR 1.19   Urinalysis:  Recent Labs Lab 07/04/14 1044  COLORURINE YELLOW  LABSPEC 1.013  PHURINE 6.5  GLUCOSEU NEGATIVE  HGBUR NEGATIVE  BILIRUBINUR NEGATIVE  KETONESUR NEGATIVE  PROTEINUR NEGATIVE  UROBILINOGEN 0.2  NITRITE NEGATIVE  LEUKOCYTESUR NEGATIVE    Micro Results: Recent Results (from the past 240 hour(s))  MRSA PCR Screening     Status: None   Collection Time: 07/04/14  4:30 PM  Result Value Ref Range Status   MRSA by PCR NEGATIVE NEGATIVE Final    Comment:        The GeneXpert MRSA Assay (FDA approved for NASAL specimens only), is one component of a comprehensive MRSA colonization surveillance program. It is not intended to diagnose MRSA infection nor to guide or monitor treatment for MRSA infections.    Studies/Results: US Abdomen Complete  07/06/2014   CLINICAL DATA:  Acute encephalopathy.  EXAM: ULTRASOUND ABDOMEN COMPLETE  COMPARISON:  Plain film 12/11/2012  FINDINGS: Gallbladder: No gallstones or wall thickening visualized. No sonographic Murphy sign noted.  Common bile duct: Diameter: Normal, 4 mm.  Liver: No gross abnormality identified. No  definite cirrhosis. Normal in echotexture.  IVC: Not well visualized.  Pancreas: Obscured/ not visualized.  Spleen: Size and appearance within normal limits.  Right Kidney: Length: 10.1 cm. Echogenicity within normal limits. No mass or hydronephrosis visualized.  Left Kidney: Length: 10.0 cm. Echogenicity within normal limits. No mass or hydronephrosis visualized.  Abdominal aorta: No aneurysm identified. The bifurcation is obscured.  Other findings: No ascites.  Exam is degraded by patient clinical status. Unable to follow directions and hold breath.  IMPRESSION: 1. Decreased sensitivity and specificity exam due to technique related factors, as described above. 2. Given this factor, no acute process in the abdomen.    Electronically Signed   By: Abigail Miyamoto [name]   On: 07/06/2014 14:53   Medications: I have reviewed the patient's current medications. Scheduled Meds: . enoxaparin (LOVENOX) injection  40 mg Subcutaneous Q24H  . feeding supplement (ENSURE ENLIVE)  237 mL Oral BID BM  . folic acid  1 mg Intravenous Daily  . levETIRAcetam  1,000 mg Intravenous Q12H  . LORazepam  0-4 mg Intravenous Q6H   Followed by  . LORazepam  0-4 mg Intravenous Q12H  . magnesium oxide  400 mg Oral Daily  . metoprolol tartrate  12.5 mg Oral BID  . multivitamin with minerals  1 tablet Oral Daily  . sodium chloride  3 mL Intravenous Q12H  . thiamine IV  250 mg Intravenous Daily  . thiamine IV  500 mg Intravenous TID   Continuous Infusions:  PRN Meds:.LORazepam **OR** LORazepam   Assessment/Plan: 69 y/o M w/ PMHx of polysubstance abuse, COPD, seizure disorder, HTN, dCHF, and severe protein calorie malnutrition, admitted for acute on chronic encephalopathy.  Acute Encephalopathy: Most likely multifactorial related to post-ictal state vs alcohol withdrawal. Significant improvement today, following commands, answering some questions appropriately, not oriented. EEG suggestive of diffuse background slowing, focal slowing in posterior quadrants, L>R. Also shows periodic lateralized discharges in left posterior quadrant, somewhat suggestive of recent clinical focal seizures or post-ictal state following status epilepticus.  -Neuro following, appreciate recommendations -Continue Keppra 1000 mg IV bid. Will change to po when patient is taking oral medications consistently.  -Started Vimpat 100 mg IV once, followed by 50 mg po bid -Continue thiamine 865 mg daily -Folic acid 1 mg IV -CIWA protocol -Seizure precautions  Polysubstance abuse: Alcohol, marijuana.  -Continue CIWA protocol  Hypomagnesemia: Improving.  -Replete as necessary  Chronic dCHF: EF 55-60% with grade 1 diastolic dysfunction as noted on ECHO  05/08/14. -Continue to monitor  Severe Protein Calorie Malnutrition: Stable.  -Ensure  HTN: Stable.   -Metoprolol 12.5 mg bid  DVT/PE PPx: Lovenox [place]  CODE STATUS: FULL CODE   Dispo: Disposition is deferred at this time, awaiting improvement of current medical problems.   -Consult social work for disposition. Pending SNF placement.   The patient does not know have a current PCP [facility]) and does not know need an [facility] hospital follow-up appointment after discharge.  The patient does have transportation limitations that hinder transportation to clinic appointments.  .Services Needed at time of discharge: Y = Yes, Blank = No PT:   OT:   RN:   Equipment:   Other:     LOS: 3 days   Corky Sox, MD 07/07/2014, 6:43 AM

## 2014-07-08 LAB — BASIC METABOLIC PANEL WITH GFR
Anion gap: 10 (ref 5–15)
BUN: <5 mg/dL — ABNORMAL LOW (ref 6–20)
CO2: 22 mmol/L (ref 22–32)
Calcium: 8.9 mg/dL (ref 8.9–10.3)
Chloride: 104 mmol/L (ref 101–111)
Creatinine, Ser: 0.66 mg/dL (ref 0.61–1.24)
GFR calc Af Amer: >60 mL/min
GFR calc non Af Amer: >60 mL/min
Glucose, Bld: 103 mg/dL — ABNORMAL HIGH (ref 70–99)
Potassium: 3.9 mmol/L (ref 3.5–5.1)
Sodium: 136 mmol/L (ref 135–145)

## 2014-07-08 LAB — MAGNESIUM: MAGNESIUM: 1.8 mg/dL (ref 1.7–2.4)

## 2014-07-08 MED ORDER — MAGNESIUM SULFATE 2 GM/50ML IV SOLN
2.0000 g | Freq: Once | INTRAVENOUS | Status: AC
  Administered 2014-07-08: 2 g via INTRAVENOUS
  Filled 2014-07-08: qty 50

## 2014-07-08 MED ORDER — LEVETIRACETAM 500 MG PO TABS
1000.0000 mg | ORAL_TABLET | Freq: Two times a day (BID) | ORAL | Status: DC
  Administered 2014-07-08 – 2014-07-11 (×7): 1000 mg via ORAL
  Filled 2014-07-08 (×8): qty 2

## 2014-07-08 MED ORDER — FOLIC ACID 1 MG PO TABS
1.0000 mg | ORAL_TABLET | Freq: Every day | ORAL | Status: DC
  Administered 2014-07-08 – 2014-07-11 (×4): 1 mg via ORAL
  Filled 2014-07-08 (×4): qty 1

## 2014-07-08 NOTE — Progress Notes (Addendum)
Subjective: This morning, he continues to appear better. I told him that I had spoken with his wife regarding his current state of health, and he appeared to be listening attentively. He continued to repeat the statement, "I don't know." I sense that he feels he is ready to go home and is unsure why he is still here in the hospital, so I explained to him that we are considering facility placement until he can get stronger and his speech improves to the point where he is able to be a caregiver for his wife. I also advised him that what brought him into the hospital was alcohol, and that if he continues to drink the amount that he does drink, he will no longer be able to care for his wife. He appeared to acknowledge understanding though attempted to communicate to me that he was concerned about who would be caring for his wife.  Upon leaving the room, I noticed he was reaching for food from his food tray. It appeared that he was able to care big pieces of food in place in his mouth without much difficulty. Per RN, yesterday he was having difficulty utilizing utensils to cut food.  Objective: Vital signs in last 24 hours: Filed Vitals:   07/07/14 1734 07/07/14 2227 07/08/14 0143 07/08/14 0629  BP: 140/91 127/73 127/93 176/98  Pulse: 91 88 93 103  Temp: 98.4 F (36.9 C) 98.6 F (37 C) 98.2 F (36.8 C) 98.3 F (36.8 C)  TempSrc: Oral Oral Oral Oral  Resp: 20 16 18 16   Height:      Weight:      SpO2: 98% 96% 96% 99%   Weight change:   Intake/Output Summary (Last 24 hours) at 07/08/14 0263 Last data filed at 07/07/14 2300  Gross per 24 hour  Intake    480 ml  Output    525 ml  Net    -45 ml   Physical Exam: General: Thin elderly African-American male, malodorous, alert, cooperative, NAD. Not oriented.  HEENT: PERRL, EOMI. Moist mucus membranes Lungs: Clear to ascultation bilaterally in the anterior lung fields, normal work of respiration, no wheezes, rales, rhonchi Heart: RRR, no  murmurs, gallops, or rubs Abdomen: Soft, non-tender, non-distended, BS + Extremities: No cyanosis, clubbing, or edema Neurologic: Unable to follow commands though stable from admission [lifts legs when asked wiggle toes], unable to follow coordination testing [touches cheek when asked to touch nose and cannot touch my finger, unable to perform rapid alternating movements], tracks movement side to side and above though unable to look downwards when asked on command, unable to name where he is currently even with prompting   Lab Results: Basic Metabolic Panel:  Recent Labs Lab 07/07/14 0717 07/08/14 0553  NA 134* 136  K 3.8 3.9  CL 101 104  CO2 22 22  GLUCOSE 117* 103*  BUN <5* <5*  CREATININE 0.73 0.66  CALCIUM 8.9 8.9  MG 1.7 1.8   Liver Function Tests:  Recent Labs Lab 07/04/14 1016  AST 17  ALT 8*  ALKPHOS 60  BILITOT 0.4  PROT 7.3  ALBUMIN 3.3*    Recent Labs Lab 07/04/14 1050  AMMONIA 27   CBC:  Recent Labs Lab 07/04/14 1016 07/05/14 0236  WBC 5.9 9.8  NEUTROABS 3.0  --   HGB 13.0 12.9*  HCT 36.1* 36.6*  MCV 91.2 92.0  PLT 199 187   CBG:  Recent Labs Lab 07/04/14 1038  GLUCAP 95   Coagulation:  Recent  Labs Lab 07/04/14 1016  LABPROT 15.2  INR 1.19   Urinalysis:  Recent Labs Lab 07/04/14 1044  COLORURINE YELLOW  LABSPEC 1.013  PHURINE 6.5  GLUCOSEU NEGATIVE  HGBUR NEGATIVE  BILIRUBINUR NEGATIVE  KETONESUR NEGATIVE  PROTEINUR NEGATIVE  UROBILINOGEN 0.2  NITRITE NEGATIVE  LEUKOCYTESUR NEGATIVE    Micro Results: Recent Results (from the past 240 hour(s))  MRSA PCR Screening     Status: None   Collection Time: 07/04/14  4:30 PM  Result Value Ref Range Status   MRSA by PCR NEGATIVE NEGATIVE Final    Comment:        The GeneXpert MRSA Assay (FDA approved for NASAL specimens only), is one component of a comprehensive MRSA colonization surveillance program. It is not intended to diagnose MRSA infection nor to guide  or monitor treatment for MRSA infections.    Studies/Results: US Abdomen Complete  07/06/2014   CLINICAL DATA:  Acute encephalopathy.  EXAM: ULTRASOUND ABDOMEN COMPLETE  COMPARISON:  Plain film 12/11/2012  FINDINGS: Gallbladder: No gallstones or wall thickening visualized. No sonographic Murphy sign noted.  Common bile duct: Diameter: Normal, 4 mm.  Liver: No gross abnormality identified. No definite cirrhosis. Normal in echotexture.  IVC: Not well visualized.  Pancreas: Obscured/ not visualized.  Spleen: Size and appearance within normal limits.  Right Kidney: Length: 10.1 cm. Echogenicity within normal limits. No mass or hydronephrosis visualized.  Left Kidney: Length: 10.0 cm. Echogenicity within normal limits. No mass or hydronephrosis visualized.  Abdominal aorta: No aneurysm identified. The bifurcation is obscured.  Other findings: No ascites.  Exam is degraded by patient clinical status. Unable to follow directions and hold breath.  IMPRESSION: 1. Decreased sensitivity and specificity exam due to technique related factors, as described above. 2. Given this factor, no acute process in the abdomen.   Electronically Signed   By: Abigail Miyamoto [name]   On: 07/06/2014 14:53   Medications: I have reviewed the patient's current medications. Scheduled Meds: . enoxaparin (LOVENOX) injection  40 mg Subcutaneous Q24H  . feeding supplement (ENSURE ENLIVE)  237 mL Oral BID BM  . folic acid  1 mg Intravenous Daily  . lacosamide  50 mg Oral BID  . levETIRAcetam  1,000 mg Intravenous Q12H  . LORazepam  0-4 mg Intravenous Q12H  . magnesium oxide  400 mg Oral Daily  . metoprolol tartrate  12.5 mg Oral BID  . multivitamin with minerals  1 tablet Oral Daily  . polyethylene glycol  17 g Oral Daily  . senna-docusate  2 tablet Oral BID  . sodium chloride  3 mL Intravenous Q12H  . thiamine IV  250 mg Intravenous Daily   Continuous Infusions:  PRN Meds:.LORazepam **OR** LORazepam   Assessment/Plan: Miguel Hanson  is a 69 year old male with polysubstance abuse [alcohol, marijuana], COPD, seizure disorder, HTN, dCHF, and severe protein calorie malnutrition, admitted for acute on chronic encephalopathy.  Acute on chronic encephalopathy: Most likely multifactorial related to post-ictal state vs alcohol withdrawal. EEG 07/06/14 suggestive of diffuse background slowing, focal slowing in posterior quadrants, L>R. Also shows periodic lateralized discharges in left posterior quadrant, somewhat suggestive of recent clinical focal seizures or post-ictal state following status epilepticus. Appears improved though unsure what his new baseline will be as he does have a significant expressive deficit. -Neuro following, appreciate recommendations -Continue Keppra 1000 mg IV twice daily. Will change to po when patient is taking oral medications consistently.  -Continue Vimpat 50 mg twice a day -Continue thiamine 250 mg daily -Continue folic  acid 1 mg IV -CIWA protocol -Seizure precautions  Polysubstance abuse: UDS notable for marijuana on admission. Extensive history of alcohol abuse as noted in the chart and corroborated by several family members.  -Continue advising patient against substance abuse -CIWA as noted above  Hypomagnesemia: Magnesium 1.8 this morning, improved from 1.4 on admission. -Give magnesium 2 g IV -Continue Mag-Ox  Chronic dCHF: EF 55-60% with grade 1 diastolic dysfunction as noted on echo 05/08/14. -Continue to monitor  Severe Protein Calorie Malnutrition: Stable.  -Continue Ensure  HTN: Mostly normotensive overnight.   -Continue metoprolol 12.5 mg bid  DVT/PE PPx: Lovenox  CODE STATUS: FULL CODE  Dispo: Disposition is deferred at this time, awaiting improvement of current medical problems.   -Consult social work for disposition. Pending SNF placement.   The patient does not know have a current PCP [facility]) and does not know need an [facility] hospital follow-up appointment  after discharge.  The patient does have transportation limitations that hinder transportation to clinic appointments.  .Services Needed at time of discharge: Y = Yes, Blank = No PT:   OT:   RN:   Equipment:   Other:     LOS: 4 days   Riccardo Dubin, MD 07/08/2014, 7:07 AM

## 2014-07-08 NOTE — Progress Notes (Signed)
Subjective: No further seizure activity noted.  Patient calm this morning.  Continues with some aphasia.  Vimpat started.  Patient appears to be tolerating well.    Objective: Current vital signs: BP 133/84 mmHg  Pulse 88  Temp(Src) 98.1 F (36.7 C) (Oral)  Resp 18  Ht 5\' 7"  (1.702 m)  Wt 58 kg (127 lb 13.9 oz)  BMI 20.02 kg/m2  SpO2 98% Vital signs in last 24 hours: Temp:  [98.1 F (36.7 C)-98.6 F (37 C)] 98.1 F (36.7 C) (05/08 0933) Pulse Rate:  [85-103] 88 (05/08 0933) Resp:  [16-20] 18 (05/08 0933) BP: (127-176)/(73-98) 133/84 mmHg (05/08 0933) SpO2:  [96 %-99 %] 98 % (05/08 0933)  Intake/Output from previous day: 05/07 0701 - 05/08 0700 In: 480 [P.O.:480] Out: 525 [Urine:525] Intake/Output this shift:   Nutritional status: Diet regular Room service appropriate?: Yes; Fluid consistency:: Thin  Neurologic Exam: Mental Status: Alert. Some fluent speech noted but remains aphasic for the most part. Able to follow some commands with nonverbal cues.  Cranial Nerves: II: Discs flat bilaterally; blinks to bilateral confrontation, pupils equal, round, reactive to light and accommodation III,IV, VI: ptosis not present, extra-ocular motions intact bilaterally V,VII: smile symmetric, facial light touch sensation normal bilaterally VIII: hearing normal bilaterally IX,X: gag reflex present XI: bilateral shoulder shrug XII: midline tongue extension Motor: Lifts and maintains all extremities against gravity Sensory: Pinprick and light touch intact throughout, bilaterally Deep Tendon Reflexes: 2+ in the upper extremities, 1+ at the knees and absent at the ankles   Lab Results: Basic Metabolic Panel:  Recent Labs Lab 07/04/14 1016 07/04/14 1926 07/05/14 0236 07/06/14 0518 07/07/14 0717 07/08/14 0553  NA 138  --  137 137 134* 136  K 4.7  --  4.3 3.4* 3.8 3.9  CL 105  --  107 102 101 104  CO2 22  --  23 25 22 22   GLUCOSE 109*  --  89 90 117* 103*  BUN <5*  --   <5* <5* <5* <5*  CREATININE 0.76  --  0.70 0.65 0.73 0.66  CALCIUM 9.2  --  8.7* 9.1 8.9 8.9  MG  --  1.4*  --  1.6* 1.7 1.8    Liver Function Tests:  Recent Labs Lab 07/04/14 1016  AST 17  ALT 8*  ALKPHOS 60  BILITOT 0.4  PROT 7.3  ALBUMIN 3.3*   No results for input(s): LIPASE, AMYLASE in the last 168 hours.  Recent Labs Lab 07/04/14 1050  AMMONIA 27    CBC:  Recent Labs Lab 07/04/14 1016 07/05/14 0236  WBC 5.9 9.8  NEUTROABS 3.0  --   HGB 13.0 12.9*  HCT 36.1* 36.6*  MCV 91.2 92.0  PLT 199 187    Cardiac Enzymes: No results for input(s): CKTOTAL, CKMB, CKMBINDEX, TROPONINI in the last 168 hours.  Lipid Panel: No results for input(s): CHOL, TRIG, HDL, CHOLHDL, VLDL, LDLCALC in the last 168 hours.  CBG:  Recent Labs Lab 07/04/14 [place]    Microbiology: Results for orders placed or performed during the hospital encounter of 07/04/14  MRSA PCR Screening     Status: None   Collection Time: 07/04/14  4:30 PM  Result Value Ref Range Status   MRSA by PCR NEGATIVE NEGATIVE Final    Comment:        The GeneXpert MRSA Assay (FDA approved for NASAL specimens only), is one component of a comprehensive MRSA colonization surveillance program. It is not intended to diagnose  MRSA infection nor to guide or monitor treatment for MRSA infections.     Coagulation Studies: No results for input(s): LABPROT, INR in the last 72 hours.  Imaging: US Abdomen Complete  07/06/2014   CLINICAL DATA:  Acute encephalopathy.  EXAM: ULTRASOUND ABDOMEN COMPLETE  COMPARISON:  Plain film 12/11/2012  FINDINGS: Gallbladder: No gallstones or wall thickening visualized. No sonographic Murphy sign noted.  Common bile duct: Diameter: Normal, 4 mm.  Liver: No gross abnormality identified. No definite cirrhosis. Normal in echotexture.  IVC: Not well visualized.  Pancreas: Obscured/ not visualized.  Spleen: Size and appearance within normal limits.  Right Kidney: Length: 10.1  cm. Echogenicity within normal limits. No mass or hydronephrosis visualized.  Left Kidney: Length: 10.0 cm. Echogenicity within normal limits. No mass or hydronephrosis visualized.  Abdominal aorta: No aneurysm identified. The bifurcation is obscured.  Other findings: No ascites.  Exam is degraded by patient clinical status. Unable to follow directions and hold breath.  IMPRESSION: 1. Decreased sensitivity and specificity exam due to technique related factors, as described above. 2. Given this factor, no acute process in the abdomen.   Electronically Signed   By: Abigail Miyamoto [name]   On: 07/06/2014 14:53    Medications:  I have reviewed the patient's current medications. Scheduled: . enoxaparin (LOVENOX) injection  40 mg Subcutaneous Q24H  . feeding supplement (ENSURE ENLIVE)  237 mL Oral BID BM  . folic acid  1 mg Intravenous Daily  . lacosamide  50 mg Oral BID  . levETIRAcetam  1,000 mg Oral BID  . LORazepam  0-4 mg Intravenous Q12H  . magnesium oxide  400 mg Oral Daily  . metoprolol tartrate  12.5 mg Oral BID  . multivitamin with minerals  1 tablet Oral Daily  . polyethylene glycol  17 g Oral Daily  . senna-docusate  2 tablet Oral BID  . sodium chloride  3 mL Intravenous Q12H  . thiamine IV  250 mg Intravenous Daily    Assessment/Plan: Patient improved from yesterday but not yet at baseline. Some of his presentation may be ETOH withdrawal. EEG shows left posterior temporal PLEDS. No clinical seizure activity noted. Patient on Keppra. Remains IV since it has been a challenge to get patient to take po at times. Vimpat started on yesterday.  Appears to be tolerating well.  Current clinical picture may represent a prolonged post-ictal period.  MRI of the brain shows no acute changes.    Recommendations: 1.  Continue Keppra and Vimpat at current doses.   2.  Agree with continued CIWA   LOS: 4 days   Alexis Goodell, MD [facility] [phone] 07/08/2014  10:51  AM

## 2014-07-09 DIAGNOSIS — G40909 Epilepsy, unspecified, not intractable, without status epilepticus: Secondary | ICD-10-CM

## 2014-07-09 LAB — BASIC METABOLIC PANEL
ANION GAP: 10 (ref 5–15)
BUN: 9 mg/dL (ref 6–20)
CHLORIDE: 100 mmol/L — AB (ref 101–111)
CO2: 22 mmol/L (ref 22–32)
Calcium: 8.9 mg/dL (ref 8.9–10.3)
Creatinine, Ser: 0.67 mg/dL (ref 0.61–1.24)
GFR calc Af Amer: >60 mL/min (ref >60)
Glucose, Bld: 101 mg/dL — ABNORMAL HIGH (ref 70–99)
POTASSIUM: 3.8 mmol/L (ref 3.5–5.1)
Sodium: 132 mmol/L — ABNORMAL LOW (ref 135–145)

## 2014-07-09 LAB — MAGNESIUM: Magnesium: 1.7 mg/dL (ref 1.7–2.4)

## 2014-07-09 MED ORDER — SODIUM CHLORIDE 0.9 % IV SOLN
200.0000 mg | INTRAVENOUS | Status: AC
  Administered 2014-07-09: 200 mg via INTRAVENOUS
  Filled 2014-07-09: qty 20

## 2014-07-09 MED ORDER — LACOSAMIDE 50 MG PO TABS
150.0000 mg | ORAL_TABLET | Freq: Two times a day (BID) | ORAL | Status: AC
  Administered 2014-07-09 – 2014-07-11 (×4): 150 mg via ORAL
  Filled 2014-07-09 (×4): qty 3

## 2014-07-09 MED ORDER — VITAMIN B-1 100 MG PO TABS
250.0000 mg | ORAL_TABLET | Freq: Every day | ORAL | Status: DC
  Administered 2014-07-09 – 2014-07-11 (×3): 250 mg via ORAL
  Filled 2014-07-09 (×3): qty 3

## 2014-07-09 MED ORDER — POLYETHYLENE GLYCOL 3350 17 G PO PACK: 17.0000 g | PACK | Freq: Every day | ORAL | Status: DC | PRN

## 2014-07-09 MED ORDER — MAGNESIUM SULFATE 2 GM/50ML IV SOLN
2.0000 g | Freq: Once | INTRAVENOUS | Status: AC
  Administered 2014-07-09: 2 g via INTRAVENOUS
  Filled 2014-07-09 (×2): qty 50

## 2014-07-09 MED ORDER — LORAZEPAM 2 MG/ML IJ SOLN: 0.0000 mg | Freq: Two times a day (BID) | INTRAMUSCULAR | Status: DC

## 2014-07-09 NOTE — Progress Notes (Signed)
  PROGRESS NOTE MEDICINE TEACHING ATTENDING   Day 5 of stay Patient name: Miguel Hanson   Medical record number: [id number] Date of birth: 09-Jun-1945   Patient appeared to be more alert and answering questions better today when we saw him.  Blood pressure 148/83, pulse 80, temperature 98.1 F (36.7 C), temperature source Oral, resp. rate 18, height 5\' 7"  (1.702 m), weight 127 lb 13.9 oz (58 kg), SpO2 100 %. The patient is alert and oriented, comfortable, in no acute distress. PERRL, EOMI. Heart exhibits regular rate and rhythm, no murmurs. Lungs are clear to auscultation. Abdomen is soft and non-tender. There is no pedal edema and good pedal pulses. There are no gross focal neurological deficits apparent.   Assessment/Plan Per neurology, contrary to what we witnessed this morning, the patient seemed to have declined to monosyllabic answers when they saw him, thus indicating that he might be having ongoing unwitnessed seizures. They recommend increased AED coverage. I agree since there does not seem to be any other coherent explanation for the patient's waxing and waning mental status. Will continue to follow their recommendations at this time. Meanwhile we continue looking for a SNF bed for him.  I have discussed the care of this patient with my IM team residents. Please see the resident note for details.  [place], [place] 07/09/2014, 3:34 PM.

## 2014-07-09 NOTE — Progress Notes (Signed)
Subjective: No further seizures over night.   Exam: Filed Vitals:   07/09/14 0556  BP: 129/84  Pulse: 91  Temp: 98.9 F (37.2 C)  Resp: 18    HEENT-  Normocephalic, no lesions, without obvious abnormality.  Normal external eye and conjunctiva.  Normal TM's bilaterally.  Normal auditory canals and external ears. Normal external nose, mucus membranes and septum.  Normal pharynx. Cardiovascular- S1, S2 normal, pulses palpable throughout   Lungs- no tachypnea, retractions or cyanosis Abdomen- normal findings: bowel sounds normal Extremities- no edema Lymph-no adenopathy palpable Musculoskeletal-no joint tenderness, deformity or swelling Skin-warm and dry, no hyperpigmentation, vitiligo, or suspicious lesions    Gen: In bed, NAD MS: alert, in no distress, continues to show aphasia with some fluent components. At times able to follow visual commands and other times unable. Continues to repeat "yeah".    CN: blinks to bilateral confrontation, pupils equal, round, reactive to light, Smile symmetric, EOMI, TML Motor: moving all extremities antigravity with 5/5 strength.  Sensory: intact throughout DTR: 2+ in UE and KJ no AJ   Pertinent Labs: Na+ [place] PA-C [facility] [phone]  Impression: 69 yo M with post-ictal aphasia in the setting of continued periodic discharges. These discharges are indicative of an area that is quite irritable, and I am concerned that he continues to have frequent unwitnessed seizures. I would favor being more aggressive with his AEDs at this time.    Recommendations: 1) vimpat 200mg  IV x 1, then 150mg  BID 2) Continue keppra 1000mg  BID 3) If no improvement by tomorrow may consider change to trileptal.    Roland Rack, MD [facility] [phone]  If 7pm- 7am, please page neurology on call as listed in [place].

## 2014-07-09 NOTE — Clinical Social Work Note (Signed)
CSW attempted to contact the pt's wife [name] to discuss SNF placement. No answer at their residence CSW left a voice message. No family at the bedside. CSW will continue to reach the [place].   [place], MSW, [place]

## 2014-07-09 NOTE — Progress Notes (Signed)
Subjective: This morning, he was sitting in his chair and watching TV. He acknowledged understanding of her questions and was able to answer with words other than yes/no, like describing what he had for breakfast ["biscuits"], his name ["Miguel Hanson"].  Objective: Vital signs in last 24 hours: Filed Vitals:   07/08/14 1320 07/08/14 1756 07/09/14 0556 07/09/14 1026  BP: 126/79 124/77 129/84 123/73  Pulse: 76 97 91 75  Temp: 99 F (37.2 C) 99.1 F (37.3 C) 98.9 F (37.2 C) 98.2 F (36.8 C)  TempSrc: Oral Oral Oral Oral  Resp: 20 18 18 16   Height:      Weight:      SpO2: 99% 99% 99%    Weight change:   Intake/Output Summary (Last 24 hours) at 07/09/14 1107 Last data filed at 07/09/14 0740  Gross per 24 hour  Intake    240 ml  Output      0 ml  Net    240 ml   Physical Exam: General: Thin elderly African-American male, alert, cooperative, NAD. Not oriented.  HEENT: PERRL, EOMI. Moist mucus membranes Lungs: Clear to ascultation bilaterally in the posterior lung fields, normal work of respiration, no wheezes, rales, rhonchi Heart: RRR, no murmurs, gallops, or rubs Abdomen: Soft, non-tender, non-distended, BS + Extremities: No cyanosis, clubbing, or edema Neurologic: Unable to follow some commands though stable from admission [lifts legs when asked to wiggle toes], oriented to name as noted above though could not name where he was currently   Lab Results: Basic Metabolic Panel:  Recent Labs Lab 07/08/14 0553 07/09/14 0615  NA 136 132*  K 3.9 3.8  CL 104 100*  CO2 22 22  GLUCOSE 103* 101*  BUN <5* 9  CREATININE 0.66 0.67  CALCIUM 8.9 8.9  MG 1.8 1.7   Liver Function Tests:  Recent Labs Lab 07/04/14 1016  AST 17  ALT 8*  ALKPHOS 60  BILITOT 0.4  PROT 7.3  ALBUMIN 3.3*    Recent Labs Lab 07/04/14 1050  AMMONIA 27   CBC:  Recent Labs Lab 07/04/14 1016 07/05/14 0236  WBC 5.9 9.8  NEUTROABS 3.0  --   HGB 13.0 12.9*  HCT 36.1* 36.6*  MCV 91.2 92.0    PLT 199 187   CBG:  Recent Labs Lab 07/04/14 1038  GLUCAP 95   Coagulation:  Recent Labs Lab 07/04/14 1016  LABPROT 15.2  INR 1.19   Urinalysis:  Recent Labs Lab 07/04/14 1044  COLORURINE YELLOW  LABSPEC 1.013  PHURINE 6.5  GLUCOSEU NEGATIVE  HGBUR NEGATIVE  BILIRUBINUR NEGATIVE  KETONESUR NEGATIVE  PROTEINUR NEGATIVE  UROBILINOGEN 0.2  NITRITE NEGATIVE  LEUKOCYTESUR NEGATIVE    Micro Results: Recent Results (from the past 240 hour(s))  MRSA PCR Screening     Status: None   Collection Time: 07/04/14  4:30 PM  Result Value Ref Range Status   MRSA by PCR NEGATIVE NEGATIVE Final    Comment:        The GeneXpert MRSA Assay (FDA approved for NASAL specimens only), is one component of a comprehensive MRSA colonization surveillance program. It is not intended to diagnose MRSA infection nor to guide or monitor treatment for MRSA infections.    Studies/Results: No results found. Medications: I have reviewed the patient's current medications. Scheduled Meds: . enoxaparin (LOVENOX) injection  40 mg Subcutaneous Q24H  . feeding supplement (ENSURE ENLIVE)  237 mL Oral BID BM  . folic acid  1 mg Oral Daily  . lacosamide  50  mg Oral BID  . levETIRAcetam  1,000 mg Oral BID  . LORazepam  0-4 mg Intravenous Q12H  . magnesium oxide  400 mg Oral Daily  . metoprolol tartrate  12.5 mg Oral BID  . multivitamin with minerals  1 tablet Oral Daily  . sodium chloride  3 mL Intravenous Q12H  . thiamine  250 mg Oral Daily   Continuous Infusions:  PRN Meds:.   Assessment/Plan: Miguel Hanson is a 69 year old male with polysubstance abuse [alcohol, marijuana], COPD, seizure disorder, HTN, dCHF, and severe protein calorie malnutrition, admitted for acute on chronic encephalopathy.  Acute on chronic encephalopathy: Most likely multifactorial related to post-ictal state vs alcohol withdrawal/intoxication. EEG 07/06/14 suggestive of diffuse background slowing, focal slowing in  posterior quadrants, L>R. Also shows periodic lateralized discharges in left posterior quadrant, somewhat suggestive of recent clinical focal seizures or post-ictal state following status epilepticus. CIWA scores mostly 4 from altered sensorium though continues to show mild improvement but will require ongoing assessment to establish new baseline for his ability to express himself. -Neuro following, appreciate recommendations -Continue Keppra 1000 mg twice daily -Continue Vimpat 50 mg twice daily -Continue thiamine 250 mg daily -Continue folic acid 1 mg  -Discontinue sitter order -Continue PT/OT assessment -CIWA protocol -Seizure precautions  Polysubstance abuse: UDS notable for marijuana on admission. Extensive history of alcohol abuse as noted in the chart and corroborated by several family members.  -Continue advising patient against substance abuse -CIWA as noted above  Hypomagnesemia: Magnesium 1.7 this morning, improved from 1.4 on admission. -Give magnesium 2 g IV -Continue Mag-Ox  Chronic dCHF: EF 55-60% with grade 1 diastolic dysfunction as noted on echo 05/08/14. -Continue to monitor  Severe Protein Calorie Malnutrition: Continue Ensure  HTN: Mostly normotensive overnight.   -Continue metoprolol 12.5 mg bid  DVT/PE PPx: Lovenox  CODE STATUS: FULL CODE  Dispo: Disposition is deferred at this time, awaiting improvement of current medical problems.   -Consult social work for disposition. Pending SNF placement.   The patient does not know have a current PCP [facility]) and does not know need an [facility] hospital follow-up appointment after discharge.  The patient does have transportation limitations that hinder transportation to clinic appointments.  .Services Needed at time of discharge: Y = Yes, Blank = No PT:   OT:   RN:   Equipment:   Other:     LOS: 5 days   Miguel Dubin, MD 07/09/2014, 11:07 AM

## 2014-07-09 NOTE — Progress Notes (Signed)
PT Cancellation Note  Patient Details Name: Miguel Hanson MRN: [id number] DOB: [birth year]   Cancelled Treatment:    Reason Eval/Treat Not Completed:  (pt refused). Attempted to ambulate with pt however he adamantly refused and pulled covers up over head and stated "we aren't going to walk anywhere." Encouraged pt and educated pt on role of PT, pt still refused. PT to re-attempt as able.   Kingsley Callander 07/09/2014, 3:54 PM   Kittie Plater, PT, DPT Pager #: [phone] Office #: [phone]

## 2014-07-10 ENCOUNTER — Inpatient Hospital Stay (HOSPITAL_COMMUNITY): Payer: Medicare Other

## 2014-07-10 DIAGNOSIS — R4701 Aphasia: Secondary | ICD-10-CM

## 2014-07-10 LAB — BASIC METABOLIC PANEL
ANION GAP: 14 (ref 5–15)
BUN: 9 mg/dL (ref 6–20)
CHLORIDE: 103 mmol/L (ref 101–111)
CO2: 18 mmol/L — AB (ref 22–32)
Calcium: 9.1 mg/dL (ref 8.9–10.3)
Creatinine, Ser: 0.71 mg/dL (ref 0.61–1.24)
GFR calc Af Amer: >60 mL/min (ref >60)
GLUCOSE: 87 mg/dL (ref 70–99)
POTASSIUM: 4.3 mmol/L (ref 3.5–5.1)
SODIUM: 135 mmol/L (ref 135–145)

## 2014-07-10 LAB — MAGNESIUM: MAGNESIUM: 1.7 mg/dL (ref 1.7–2.4)

## 2014-07-10 MED ORDER — LORAZEPAM 2 MG/ML IJ SOLN: 1.0000 mg | INTRAMUSCULAR | Status: DC | PRN

## 2014-07-10 MED ORDER — MAGNESIUM CHLORIDE 64 MG PO TBEC
2.0000 | DELAYED_RELEASE_TABLET | Freq: Every day | ORAL | Status: DC
  Administered 2014-07-10 – 2014-07-11 (×2): 128 mg via ORAL
  Filled 2014-07-10 (×2): qty 2

## 2014-07-10 MED ORDER — OXCARBAZEPINE 300 MG PO TABS
600.0000 mg | ORAL_TABLET | Freq: Two times a day (BID) | ORAL | Status: DC
  Administered 2014-07-10 – 2014-07-11 (×3): 600 mg via ORAL
  Filled 2014-07-10 (×5): qty 2

## 2014-07-10 NOTE — Procedures (Signed)
ELECTROENCEPHALOGRAM REPORT  Patient: Miguel Hanson       Room #: 7T24 EEG No. ID: [id number] Age: 69 y.o.        Sex: male Referring Physician: Ezzie Dural Report Date:  07/10/2014        Interpreting Physician: Anthony Sar  History: [name] is an 69 y.o. male history of seizure disorder on Vimpat with residual aphasia following seizure activity.  Indications for study:  Rule out focal seizure activity.  Technique: This is an 18 channel routine scalp EEG performed at the bedside with bipolar and monopolar montages arranged in accordance to the international 10/20 system of electrode placement.   Description: This EEG recording was performed during wakefulness. Patient was noted to be confused at times study. Predominant background activity consisted of moderate amplitude diffuse mixed delta and theta activity, as well as frequent somewhat periodic appearing sharp wave discharges that were most prominent in the left parietal and temporal regions with triphasic-like configurations. Photic stimulation was not performed. No frank epileptiform discharges were recorded.  Interpretation: This EEG is abnormal with diffuse moderately severe generalized slowing of cerebral activity as well as somewhat periodic sharp wave discharges recorded from the left parietal and temporal regions. These did not appear to be frank epileptic type discharges. Clinical correlation is warranted. Contrast MRI of the brain may be helpful.   Rush Farmer [name] [facility] [phone]

## 2014-07-10 NOTE — Clinical Social Work Note (Signed)
Clinical Social Work Assessment  Patient Details  Name: Miguel Hanson MRN: [id number] Date of Birth: 06/17/1945  Date of referral:  07/10/14               Reason for consult:  Facility Placement, Substance Use/ETOH Abuse                Permission sought to share information with:  Customer service manager, Family Supports     Housing/Transportation Living arrangements for the past 2 months:  Single Family Home Source of Information:  Adult Children Patient Interpreter Needed:  None Criminal Activity/Legal Involvement Pertinent to Current Situation/Hospitalization:  No - Comment as needed Significant Relationships:  Adult Children, Spouse Lives with:  Spouse Do you feel safe going back to the place where you live?  No Need for family participation in patient care:  Yes (Comment)  Care giving concerns:  Pt's dtr-in-law reported patient needs someone to follow-up with him once he returns home from facility.    Social Worker assessment / plan:  Holiday representative spoke with pt's dtr-in-law, Miguel Hanson via telephone in post-acute placement for SNF. CSW introduced CSW role and SNF process. Pt's dtr-in-law reported pt has been a resident at [facility] about 2 months ago however pt signed himself out and had a taxi transport him home. Pt's dtr-in-law stated that family was NOT notified of pt signing himself out of the facility and is unpleased with the service delivery and communication. Pt's dtr-in-law also stated Miguel Hanson is pt's [place] or [facility] Rack however she has never physically seen the legal document. Pt's family agreeable to SNF search and would prefer pt to be located close to home due to transporting pt's wife to visit him. Pt is from home with wife who is wheelchair bound. CSW attempted to contact pt's son, Miguel Hanson. No further concerns reported by family. CSW unable to assess for substance abuse due to pt being disoriented. CSW will continue  to follow pt and pt's family for continued support and to facilitate pt's discharge needs once medically stable.   Employment status:  Retired Nurse, adult PT Recommendations:  [place] / Referral to community resources:  [place]  Patient/Family's Response to care:  Pt lying in bed confused (CSW unable to assess). Pt's family agreeable to SNF search. Pt's dtr-in-law pleasant and appreciated social work intervention.   Patient/Family's Understanding of and Emotional Response to Diagnosis, Current Treatment, and Prognosis:  Pt's family aware of pt's diagnosis and current treatment. Pt's family supportive and involved in pt's care.   Emotional Assessment Appearance:  Appears stated age Attitude/Demeanor/Rapport:  Unable to Assess Affect (typically observed):  Unable to Assess Orientation:  Oriented to Self Alcohol / Substance use:  Alcohol Use Psych involvement (Current and /or in the community):  No (Comment)  Discharge Needs  Concerns to be addressed:  Cognitive Concerns Readmission within the last 30 days:  No Current discharge risk:  Cognitively Impaired, Substance Abuse Barriers to Discharge:  No Barriers Identified   Glendon Axe, MSW, LCSWA [phone] 07/10/2014 12:41 PM

## 2014-07-10 NOTE — Progress Notes (Signed)
Nutrition Follow-up  DOCUMENTATION CODES:  Severe malnutrition in context of chronic illness  INTERVENTION:  Ensure Enlive (each supplement provides 350kcal and 20 grams of protein) (BID)  NUTRITION DIAGNOSIS:  Malnutrition related to chronic illness as evidenced by severe depletion of body fat, severe depletion of muscle mass.  Ongoing  GOAL:  Patient will meet greater than or equal to 90% of their needs  Being Met  MONITOR:  PO intake, Supplement acceptance  REASON FOR ASSESSMENT:  Malnutrition Screening Tool    ASSESSMENT: History of polysubstance abuse [alcohol, marijuana], history of COPD, seizure disorder, hypertension, diastolic heart failure, history of PTSD who presented to the ED with acute on chronic encephalopathy.  Pt out of room for EEG at time of visit, per RN. RN reports that pt has been eating 100% of meals and drinking Ensure supplements well. No new weight.  Labs reviewed.   Height:  Ht Readings from Last 1 Encounters:  07/04/14 $RemoveB'5\' 7"'NWAprZAi$  (1.702 m)    Weight:  Wt Readings from Last 1 Encounters:  07/04/14 127 lb 13.9 oz (58 kg)    Ideal Body Weight:  67.2 kg  Wt Readings from Last 10 Encounters:  07/04/14 127 lb 13.9 oz (58 kg)  05/15/14 120 lb 13 oz (54.8 kg)  05/08/14 120 lb (54.432 kg)  02/27/14 118 lb 13.3 oz (53.9 kg)  12/17/13 116 lb (52.617 kg)  03/03/13 116 lb 4.8 oz (52.753 kg)  12/12/12 116 lb 13.5 oz (53 kg)  05/15/12 111 lb (50.349 kg)  12/18/11 106 lb 3.2 oz (48.172 kg)  09/15/11 124 lb (56.246 kg)    BMI:  Body mass index is 20.02 kg/(m^2).  Estimated Nutritional Needs:  Kcal:  1500-1700  Protein:  75-95  Fluid:  >1.5 L/day  Skin:  Reviewed, no issues  Diet Order:  Diet regular Room service appropriate?: Yes; Fluid consistency:: Thin  EDUCATION NEEDS:  No education needs identified at this time   Intake/Output Summary (Last 24 hours) at 07/10/14 1524 Last data filed at 07/10/14 1420  Gross per 24 hour   Intake    960 ml  Output   1050 ml  Net    -90 ml    Last BM:  5/9   Pryor Ochoa RD, LDN Inpatient Clinical Dietitian Pager: [phone] After Hours Pager: [phone]

## 2014-07-10 NOTE — Progress Notes (Signed)
  PROGRESS NOTE MEDICINE TEACHING ATTENDING   Day 6 of stay Patient name: Miguel Hanson   Medical record number: [id number] Date of birth: [birth year]   The patient is back to giving one word answers.  Blood pressure 113/67, pulse 95, temperature 98.7 F (37.1 C), temperature source Oral, resp. rate 18, height 5\' 7"  (1.702 m), weight 127 lb 13.9 oz (58 kg), SpO2 97 %. The patient is alert and oriented, comfortable, in no acute distress. PERRL, EOMI. Heart exhibits regular rate and rhythm, no murmurs. Lungs are clear to auscultation. Abdomen is soft and non-tender. There is no pedal edema and good pedal pulses. He is unable to follow commands today, and repeats 1 or 2 words the whole time. He is otherwise moving all his extremities.   Assessment/Plan We continue to follow neurology advice for unwitnessed seizures as possible etiology of his altered mental status. Rest per Dr Serita Grit note from today. The case was seen and discussed with the entire IM team in the morning.  I have discussed the care of this patient with my IM team residents. Please see the resident note for details. [place], [place] 07/10/2014, 12:09 PM.

## 2014-07-10 NOTE — Progress Notes (Addendum)
Subjective: This morning, he was sitting in his chair and watching TV. He had finished breakfast on exam though could not describe what he had just eaten. He was perseverating on one phrase, "Mccade, Sullenberger."   Objective: Vital signs in last 24 hours: Filed Vitals:   07/09/14 2135 07/10/14 0118 07/10/14 0623 07/10/14 0954  BP: 129/91 128/87 111/74 113/67  Pulse: 101 87 92 95  Temp: 97.7 F (36.5 C) 98.4 F (36.9 C) 98.5 F (36.9 C) 98.7 F (37.1 C)  TempSrc: Oral Oral Oral Oral  Resp: 18 19 18 18   Height:      Weight:      SpO2: 100% 96% 94% 97%   Weight change:   Intake/Output Summary (Last 24 hours) at 07/10/14 1307 Last data filed at 07/10/14 1200  Gross per 24 hour  Intake    770 ml  Output    950 ml  Net   -180 ml   Physical Exam: General: Thin elderly African-American male, alert, cooperative, NAD.  HEENT: PERRL, EOMI. Moist mucus membranes Lungs: Clear to ascultation bilaterally in the posterior lung fields, normal work of respiration, no wheezes, rales, rhonchi Heart: RRR, no murmurs, gallops, or rubs Abdomen: Soft, non-tender, non-distended, BS + Extremities: No cyanosis, clubbing, or edema Neurology: Oriented to name [[name]], but could not name place or year even with prompting. Appears to understand what is said to him. Still unable to follow commands, like wiggling toes. Moving all extremities spontaneously.   Lab Results: Basic Metabolic Panel:  Recent Labs Lab 07/09/14 0615 07/10/14 0558  NA 132* 135  K 3.8 4.3  CL 100* 103  CO2 22 18*  GLUCOSE 101* 87  BUN 9 9  CREATININE 0.67 0.71  CALCIUM 8.9 9.1  MG 1.7 1.7   Liver Function Tests:  Recent Labs Lab 07/04/14 1016  AST 17  ALT 8*  ALKPHOS 60  BILITOT 0.4  PROT 7.3  ALBUMIN 3.3*    Recent Labs Lab 07/04/14 1050  AMMONIA 27   CBC:  Recent Labs Lab 07/04/14 1016 07/05/14 0236  WBC 5.9 9.8  NEUTROABS 3.0  --   HGB 13.0 12.9*  HCT 36.1* 36.6*  MCV 91.2 92.0  PLT 199 187     CBG:  Recent Labs Lab 07/04/14 1038  GLUCAP 95   Coagulation:  Recent Labs Lab 07/04/14 1016  LABPROT 15.2  INR 1.19   Urinalysis:  Recent Labs Lab 07/04/14 1044  COLORURINE YELLOW  LABSPEC 1.013  PHURINE 6.5  GLUCOSEU NEGATIVE  HGBUR NEGATIVE  BILIRUBINUR NEGATIVE  KETONESUR NEGATIVE  PROTEINUR NEGATIVE  UROBILINOGEN 0.2  NITRITE NEGATIVE  LEUKOCYTESUR NEGATIVE    Micro Results: Recent Results (from the past 240 hour(s))  MRSA PCR Screening     Status: None   Collection Time: 07/04/14  4:30 PM  Result Value Ref Range Status   MRSA by PCR NEGATIVE NEGATIVE Final    Comment:        The GeneXpert MRSA Assay (FDA approved for NASAL specimens only), is one component of a comprehensive MRSA colonization surveillance program. It is not intended to diagnose MRSA infection nor to guide or monitor treatment for MRSA infections.    Studies/Results: No results found. Medications: I have reviewed the patient's current medications. Scheduled Meds: . enoxaparin (LOVENOX) injection  40 mg Subcutaneous Q24H  . feeding supplement (ENSURE ENLIVE)  237 mL Oral BID BM  . folic acid  1 mg Oral Daily  . lacosamide  150 mg Oral BID  .  levETIRAcetam  1,000 mg Oral BID  . LORazepam  0-4 mg Intravenous Q12H  . magnesium oxide  400 mg Oral Daily  . metoprolol tartrate  12.5 mg Oral BID  . multivitamin with minerals  1 tablet Oral Daily  . OXcarbazepine  600 mg Oral BID  . sodium chloride  3 mL Intravenous Q12H  . thiamine  250 mg Oral Daily   Continuous Infusions:  PRN Meds:.   Assessment/Plan: Miguel Hanson is a 69 year old male with polysubstance abuse [alcohol, marijuana], COPD, seizure disorder, HTN, dCHF, and severe protein calorie malnutrition, admitted for acute on chronic encephalopathy.  Acute on chronic encephalopathy: Most likely multifactorial related to post-ictal state vs alcohol withdrawal. EEG 07/06/14 suggestive of diffuse background slowing, focal  slowing in posterior quadrants, L>R. Also shows periodic lateralized discharges in left posterior quadrant, somewhat suggestive of recent clinical focal seizures or post-ictal state following status epilepticus. it appears that he may still have uncontrolled seizure activity from an irritable focus noted on EEG. CIWA scores 0 over the last 24 hours.  -Neuro following, appreciate recommendations -Continue Keppra 1000 mg twice daily -Continue Vimpat 150 mg twice daily  with plan to stop tomorrow -Start Trileptal 600 mg twice daily -Check CBC and BMET to assess for adverse effects -Continue thiamine 250 mg daily -Continue folic acid 1 mg  -Continue PT/OT assessment -Discontinue CIWA protocol -Keep Ativan 1-2 mg every 4 hours as needed for seizure and continue seizure precautions   Polysubstance abuse: UDS notable for marijuana on admission. Extensive history of alcohol abuse as noted in the chart and corroborated by several family members.  -Continue advising patient against substance abuse  Hypomagnesemia: Magnesium 1.7 this morning, improved from 1.4 on admission though stable from yesterday . -Discontinue Mag-Ox and try Slow-Mag 2 tablets daily  Chronic dCHF: EF 55-60% with grade 1 diastolic dysfunction as noted on echo 05/08/14. -Continue to monitor  Severe Protein Calorie Malnutrition: Continue Ensure  HTN: Mostly normotensive overnight.   -Continue metoprolol 12.5 mg bid  DVT/PE PPx: Lovenox  CODE STATUS: FULL CODE  Dispo: Disposition is deferred at this time, awaiting improvement of current medical problems followed by SNF placement.   The patient does not know have a current PCP [facility]) and does not know need an [facility] hospital follow-up appointment after discharge.  The patient does have transportation limitations that hinder transportation to clinic appointments.  .Services Needed at time of discharge: Y = Yes, Blank = No PT:   OT:   RN:   Equipment:     Other:     LOS: 6 days   Riccardo Dubin, MD 07/10/2014, 1:07 PM

## 2014-07-10 NOTE — Progress Notes (Signed)
Physical Therapy Treatment Patient Details Name: Miguel Hanson MRN: [id number] DOB: 17-Feb-1946 Today's Date: 07/10/2014    History of Present Illness This 69 y.o. male admitted with AMS, decreased ability to communicate and decreased ability to use Rt. UE.  CT and MRI of head wihtout acute findings.  Work up underway.  current diagnosis acute on chronic encephalopathy.  Initially thought to have Wernicke's encephalopathy/korsakoff syndrom, but that diagnosis is in question at time of OT/PT evals.  PMH includes: h/o ETOH abuse, diastolic heart failure, HTN, COPD    PT Comments    Pt with increased ambulation tolerance this date but con't to be at increased falls risk with impaired cognition limiting ability to safely care for self. Cont' to recommend SNF upon d/c.  Follow Up Recommendations  SNF     Equipment Recommendations  None recommended by PT    Recommendations for Other Services       Precautions / Restrictions Precautions Precautions: Fall Restrictions Weight Bearing Restrictions: No    Mobility  Bed Mobility Overal bed mobility: Needs Assistance Bed Mobility: Supine to Sit     Supine to sit: Min guard     General bed mobility comments: assist for safety due to impulsivity  Transfers Overall transfer level: Needs assistance Equipment used: 1 person hand held assist Transfers: Sit to/from Stand Sit to Stand: Min assist         General transfer comment: assist for safety  Ambulation/Gait Ambulation/Gait assistance: Min assist Ambulation Distance (Feet): 500 Feet Assistive device: 1 person hand held assist Gait Pattern/deviations: Step-through pattern;Narrow base of support (near scissoring)   Gait velocity interpretation: Below normal speed for age/gender General Gait Details: pt extremely unsteady requiring PT to maintain balance/prevent fall. pt with freq lean to Right   Stairs            Wheelchair Mobility    Modified Rankin (Stroke  Patients Only)       Balance Overall balance assessment: Needs assistance         Standing balance support: During functional activity Standing balance-Leahy Scale: Poor Standing balance comment: during washing of hands and standing to urinate pt required PT to steady self                    Cognition Arousal/Alertness: Awake/alert Behavior During Therapy: Flat affect Overall Cognitive Status: Impaired/Different from baseline Area of Impairment: Attention;Following commands;Safety/judgement;Awareness;Problem solving;Memory   Current Attention Level: Sustained   Following Commands: Follows one step commands with increased time;Follows one step commands inconsistently Safety/Judgement: Decreased awareness of safety;Decreased awareness of deficits Awareness: Intellectual Problem Solving: Slow processing;Decreased initiation;Difficulty sequencing;Requires verbal cues;Requires tactile cues General Comments: pt unable to flush toliet without max verbal/tactile cues. Asked pt to wash hands and he only turned water on/off. Tactile cues to find soap and wash hands properly as well as paper towels.    Exercises      General Comments        Pertinent Vitals/Pain Pain Assessment: Faces Pain Score: 0-No pain Faces Pain Scale: No hurt    Home Living                      Prior Function            PT Goals (current goals can now be found in the care plan section) Acute Rehab PT Goals Patient Stated Goal: none state Progress towards PT goals: Progressing toward goals    Frequency  Min 3X/week  PT Plan Current plan remains appropriate    Co-evaluation             End of Session Equipment Utilized During Treatment: Gait belt Activity Tolerance: Patient tolerated treatment well Patient left:  (EOB with tech)     Time: 2111-7356 PT Time Calculation (min) (ACUTE ONLY): 12 min  Charges:  $Gait Training: 8-22 mins                    G Codes:       Kingsley Callander 07/10/2014, 4:55 PM   Kittie Plater, PT, DPT Pager #: [phone] Office #: [phone]

## 2014-07-10 NOTE — Progress Notes (Signed)
PT Cancellation Note  Patient Details Name: Miguel Hanson MRN: [id number] DOB: 02-May-1945   Cancelled Treatment:    Reason Eval/Treat Not Completed: Other (comment) Pt adamantly refused to amb with PT with report "i don't feel like walking." Pt educated on importance of mobility and pt with no noted comprehension. PT to return as able.   Kingsley Callander 07/10/2014, 12:44 PM   Kittie Plater, PT, DPT Pager #: [phone] Office #: [phone]

## 2014-07-10 NOTE — Progress Notes (Signed)
Subjective: No complaints. Continues to show expressive aphasia. Continues to repeat "[name]".   Exam: Filed Vitals:   07/10/14 0954  BP: 113/67  Pulse: 95  Temp: 98.7 F (37.1 C)  Resp: 18    HEENT-  Normocephalic, no lesions, without obvious abnormality.  Normal external eye and conjunctiva.  Normal TM's bilaterally.  Normal auditory canals and external ears. Normal external nose, mucus membranes and septum.  Normal pharynx. Cardiovascular- S1, S2 normal, pulses palpable throughout   Lungs- chest clear, no wheezing, rales, normal symmetric air entry Abdomen- normal findings: bowel sounds normal Extremities- no edema Lymph-no adenopathy palpable Musculoskeletal-no joint tenderness, deformity or swelling Skin-warm and dry, no hyperpigmentation, vitiligo, or suspicious lesions    Gen: In bed, NAD MS: alert, not oriented to place, month, date.  At times able to follow verbal commands but majority of time unable to understand what was asked of him. Unable to name objects and unable to repeat words. When asked to show his thumb he hold his hand up.  When asked to stick his tongue out and look to the left and right he does so with no difficulty.  CN: PERRLA, EOMI, TML face equal symmetric, sensation intact.  Motor: moving all extremities antigravity.  Sensory: sensation intact throughout DTR: 2+ throughout UE and KJ with no AJ  Pertinent Labs: None  Miguel Quill PA-C [facility] [phone]  Impression: 69 YO male S/P seizure with residual aphasia. I continue to suspect that this is an epileptic phenomenon due to the irritability on the EEG. Given his current state, however, would not proceed with aggressive measures such as burst suppression, etc. It would be reasonable to try to slightly change his AED regimen.    Recommendations: 1) Trileptal 600mg  BID 2) last dose vimpat tomorrow am 3) continue keppra 4) will continue to follow.    Miguel Rack,  MD [facility] [phone]  If 7pm- 7am, please page neurology on call as listed in [place].  07/10/2014, 11:03 AM

## 2014-07-10 NOTE — Progress Notes (Signed)
EEG completed; results pending.    

## 2014-07-10 NOTE — Clinical Social Work Note (Signed)
Patient's son, Gus Littler chooses bed at [facility]. CSW has notified facility.  FL-2 on chart for MD signature.   Glendon Axe, MSW, LCSWA [phone] 07/10/2014 4:27 PM

## 2014-07-10 NOTE — Clinical Social Work Note (Signed)
Clinical Social Worker has attempted to contact pt's son, Tudor Chandley [facility]) [phone] and dtr-in-law, Spyridon Hornstein [phone] and left messages in regards to SNF bed offers.  Patient only has 2 bed offers, [facility] and [facility].   CSW will continue to follow pt and pt's family for continued support and to facilitate pt's discharge needs once medically stable.   FL-2 on chart for MD signature.   Glendon Axe, MSW, LCSWA [phone] 07/10/2014 2:45 PM

## 2014-07-10 NOTE — Clinical Social Work Placement (Signed)
   CLINICAL SOCIAL WORK PLACEMENT  NOTE  Date:  07/10/2014  Patient Details  Name: Miguel Hanson MRN: [id number] Date of Birth: December 12, 1945  Clinical Social Work is seeking post-discharge placement for this patient at the [place] level of care (*CSW will initial, date and re-position this form in  chart as items are completed):  Yes   Patient/family provided with [place] Work Department's list of facilities offering this level of care within the geographic area requested by the patient (or if unable, by the patient's family).  Yes   Patient/family informed of their freedom to choose among providers that offer the needed level of care, that participate in Medicare, Medicaid or managed care program needed by the patient, have an available bed and are willing to accept the patient.  Yes   Patient/family informed of [facility]'s ownership interest in [facility] and [facility], as well as of the fact that they are under no obligation to receive care at these facilities.  PASRR submitted to EDS on 07/10/14     PASRR number received on       Existing PASRR number confirmed on 07/10/14     FL2 transmitted to all facilities in geographic area requested by pt/family on 07/10/14     FL2 transmitted to all facilities within larger geographic area on       Patient informed that his/her managed care company has contracts with or will negotiate with certain facilities, including the following:            Patient/family informed of bed offers received.  Patient chooses bed at       Physician recommends and patient chooses bed at      Patient to be transferred to   on  .  Patient to be transferred to facility by       Patient family notified on   of transfer.  Name of family member notified:        PHYSICIAN Please sign FL2     Additional Comment:    _______________________________________________ Rozell Searing, LCSW 07/10/2014,  12:42 PM

## 2014-07-11 LAB — BASIC METABOLIC PANEL
Anion gap: 9 (ref 5–15)
BUN: 15 mg/dL (ref 6–20)
CO2: 27 mmol/L (ref 22–32)
CREATININE: 0.81 mg/dL (ref 0.61–1.24)
Calcium: 9.5 mg/dL (ref 8.9–10.3)
Chloride: 101 mmol/L (ref 101–111)
GFR calc Af Amer: >60 mL/min (ref >60)
GFR calc non Af Amer: >60 mL/min (ref >60)
GLUCOSE: 97 mg/dL (ref 70–99)
Potassium: 4.3 mmol/L (ref 3.5–5.1)
Sodium: 137 mmol/L (ref 135–145)

## 2014-07-11 LAB — CBC
HEMATOCRIT: 35.4 % — AB (ref 39.0–52.0)
HEMOGLOBIN: 12.4 g/dL — AB (ref 13.0–17.0)
MCH: 31.2 pg (ref 26.0–34.0)
MCHC: 35 g/dL (ref 30.0–36.0)
MCV: 88.9 fL (ref 78.0–100.0)
Platelets: 189 10*3/uL (ref 150–400)
RBC: 3.98 MIL/uL — AB (ref 4.22–5.81)
RDW: 17.7 % — ABNORMAL HIGH (ref 11.5–15.5)
WBC: 11.2 10*3/uL — ABNORMAL HIGH (ref 4.0–10.5)

## 2014-07-11 LAB — MAGNESIUM: Magnesium: 2.1 mg/dL (ref 1.7–2.4)

## 2014-07-11 MED ORDER — TRAZODONE HCL 50 MG PO TABS: 50.0000 mg | ORAL_TABLET | Freq: Every evening | ORAL | Status: DC | PRN

## 2014-07-11 MED ORDER — ADULT MULTIVITAMIN W/MINERALS CH: 1.0000 | ORAL_TABLET | Freq: Every day | ORAL | Status: DC

## 2014-07-11 MED ORDER — OXCARBAZEPINE 600 MG PO TABS: 600.0000 mg | ORAL_TABLET | Freq: Two times a day (BID) | ORAL | Status: DC

## 2014-07-11 MED ORDER — LEVETIRACETAM 500 MG PO TABS: 1000.0000 mg | ORAL_TABLET | Freq: Two times a day (BID) | ORAL | Status: DC

## 2014-07-11 NOTE — Progress Notes (Addendum)
Subjective: Patient is awake but remains unable to follow simple verbal commands. Attempts to follow visual commands but also has difficulty with this. No significant change from yesterday.   Exam: Filed Vitals:   07/11/14 0926  BP: 114/70  Pulse: 84  Temp: 97.7 F (36.5 C)  Resp: 18    HEENT-  Normocephalic, no lesions, without obvious abnormality.  Normal external eye and conjunctiva.  Normal TM's bilaterally.  Normal auditory canals and external ears. Normal external nose, mucus membranes and septum.  Normal pharynx. Cardiovascular- regular rate and rhythm, pulses palpable throughout   Lungs- no tachypnea, retractions or cyanosis Abdomen- normal findings: bowel sounds normal Extremities- no edema Lymph-no adenopathy palpable Musculoskeletal-no joint tenderness, deformity or swelling Skin-warm and dry, no hyperpigmentation, vitiligo, or suspicious lesions    Gen: In bed, NAD MS: alert, continues to repeat "b[place]" when asked questions.  Could not follow simple verbal commands.  attempted to follow visual commands but had problems with this also. No significant change since yesterday CN: PERRLA, EOMI, TML face equal symmetric, sensation intact.  Motor: moving all extremities antigravity.  Sensory: sensation intact throughout DTR: 2+ throughout UE and KJ with no AJ  Pertinent Labs: WBC 11.2 Mg 2.1   Repeat EEG: Interpretation: This EEG is abnormal with diffuse moderately severe generalized slowing of cerebral activity as well as somewhat periodic sharp wave discharges recorded from the left parietal and temporal regions. These did not appear to be frank epileptic type discharges. Clinical correlation is warranted. Contrast MRI of the brain may be helpful.    Etta Quill PA-C [facility] [phone]  Impression: 69 YO male S/P seizure with residual aphasia. It is unclear what his baseline is.  Looking back in past notes there was question of underlying  Wernicke's encephalopathy/Korsakoff syndrome.  Repeat EEG showed no epileptiform activity and continued periodic sharp wave discharges in left parietal temporal region. At this point would not make any changes in his AED regime and have him follow up out patient for further evaluation on improvement and if medications need further titration.     Recommendations: 1) Continue current AED regime 2) follow up with neurology out patient.   [facility] [address] [place], [place] [address]--[phone] or [facility] neurology [place], [place], [place] [address] [phone]  Neurology S/O   07/11/2014, 9:50 AM

## 2014-07-11 NOTE — Progress Notes (Addendum)
Occupational Therapy Treatment Patient Details Name: Miguel Hanson MRN: [id number] DOB: Jul 22, 1945 Today's Date: 07/11/2014    History of present illness This 69 y.o. male admitted with AMS, decreased ability to communicate and decreased ability to use Rt. UE.  CT and MRI of head wihtout acute findings.  Work up underway.  current diagnosis acute on chronic encephalopathy.  Initially thought to have Wernicke's encephalopathy/korsakoff syndrom, but that diagnosis is in question at time of OT/PT evals.  PMH includes: h/o ETOH abuse, diastolic heart failure, HTN, COPD   OT comments  Pt progressing towards acute OT goals. ADLs completed as detailed below. OT to continue to follow acutely. D/c plan remains appropriate.  Follow Up Recommendations  SNF    Equipment Recommendations  None recommended by OT    Recommendations for Other Services      Precautions / Restrictions Precautions Precautions: Fall Restrictions Weight Bearing Restrictions: No       Mobility Bed Mobility Overal bed mobility: Needs Assistance Bed Mobility: Supine to Sit;Sit to Supine     Supine to sit: Min guard Sit to supine: Min guard   General bed mobility comments: assist for safety due to impulsivity  Transfers Overall transfer level: Needs assistance Equipment used: 1 person hand held assist Transfers: Sit to/from Stand Sit to Stand: Min assist         General transfer comment: assist for safety    Balance Overall balance assessment: Needs assistance Sitting-balance support: Feet supported Sitting balance-Leahy Scale: Fair     Standing balance support: During functional activity Standing balance-Leahy Scale: Poor Standing balance comment: needs assist for safety. unsteadiness noted. Wide BOS during standing at sink.                    ADL Overall ADL's : Needs assistance/impaired   Eating/Feeding Details (indicate cue type and reason): Pt did not spontaneously attetmpt self  feeding. Declined food presented. Noted items on meal tray untouched.  Grooming: Wash/dry hands;Wash/dry face;Oral care;Applying deodorant;Brushing hair;Moderate assistance;Cueing for sequencing;Sitting Grooming Details (indicate cue type and reason): Pt needing visual and tactile cues as well as some HOH assist for sequencing of toilet transfer and grooming task. Pt was noted to misuse grooming tools (ex. attempting to brush teeth with comb). Once assistance with initiating grooming task was provided (HOH to brush comb through hair) pt could take over task.  Perseveration noted.                  Toilet Transfer: Minimal assistance;Ambulation;Regular Toilet;Grab bars Toilet Transfer Details (indicate cue type and reason): pt needing assist with balance and sequencing standing up from toilet         Functional mobility during ADLs: Minimal assistance General ADL Comments: Pt ambulated to bathroom and complete toilet transfer as detailed above. Pt then completed grooming tasks mostly sitting at sink with occasional standing. Pt needing cueing thoroughout task for sequencing and to initiate proper use of grooming tools. Perseveration noted. Pt needing min A for ambulation with unsteadiness noted.      Vision                 Additional Comments: Pt unable to particiapte in formal visual assessment    Perception     Praxis      Cognition   Behavior During Therapy: Flat affect Overall Cognitive Status: Difficult to assess Area of Impairment: Attention;Following commands;Safety/judgement;Problem solving   Current Attention Level: Sustained    Following Commands: Follows one step commands inconsistently;Follows  one step commands with increased time (visual and tactitle cues provided.) Safety/Judgement: Decreased awareness of safety;Decreased awareness of deficits   Problem Solving: Slow processing;Decreased initiation;Difficulty sequencing;Requires verbal cues;Requires tactile  cues General Comments: Pt needing HOH assist for functional tasks throughout session. HOH assist for sequencing sit>stand from toilet. Incorrect use of grooming tools ie attempted to brush teeth when handed comb, placed toothpaste container in mouth. Pt responded fairly well to [facility] correction of grooming to to assist with initating proper use. For instance once his hand was guided to his mouth with the toothbrush he continued with the action of brushing his teeth. Pt need more cueing for deoderant which he repeatedly attempted to roll thorugh hair. With setup pt was successful with opening and closing containers (tooth paste lid, deoderant lid). Pt able to state name and stated "[birth year]Marland KitchenMarland [birth year]..." as his birthday. Therapist asked if birthday is [birth year]. Pt indicated yes and then began to perserverate on the words "[birth year]" repeating.    Extremity/Trunk Assessment               Exercises     Shoulder Instructions       General Comments      Pertinent Vitals/ Pain       Pain Assessment: Faces Faces Pain Scale: No hurt  Home Living                                          Prior Functioning/Environment              Frequency Min 2X/week     Progress Toward Goals  OT Goals(current goals can now be found in the care plan section)  Progress towards OT goals: Progressing toward goals  Acute Rehab OT Goals Patient Stated Goal: none state OT Goal Formulation: Patient unable to participate in goal setting Time For Goal Achievement: 07/20/14 Potential to Achieve Goals: Fair ADL Goals Pt Will Perform Eating: with supervision;sitting Pt Will Perform Grooming: with min assist;standing Pt Will Perform Upper Body Bathing: with min assist;sitting Pt Will Perform Lower Body Bathing: with mod assist;sit to/from stand Pt Will Transfer to Toilet: with min guard assist;ambulating;regular height toilet;bedside commode;grab bars Pt Will Perform Toileting - Clothing  Manipulation and hygiene: with mod assist;sit to/from stand  Plan Discharge plan remains appropriate    Co-evaluation                 End of Session Equipment Utilized During Treatment: Gait belt   Activity Tolerance Patient tolerated treatment well   Patient Left in bed;with call bell/phone within reach;with bed alarm set;Other (comment) (no SCD's in room)   Nurse Communication          Time: 0037-0488 OT Time Calculation (min): 23 min  Charges: OT General Charges $OT Visit: 1 Procedure OT Treatments $Self Care/Home Management : 23-37 mins  Hortencia Pilar 07/11/2014, 1:40 PM

## 2014-07-11 NOTE — Discharge Instructions (Signed)
Thank you for trusting [place] with your medical care!  You were hospitalized for acute encephalopathy, likely from seizures and treated with seizure medication.   Please take note of the following changes to your medications: -START Trileptal 600mg   -CONTINUE Keppra 1000mg  TWICE daily -CONTINUE Mag-Ox 400mg    To make sure you are getting better, please make it to the follow-up appointments listed on the first page.  If you have any questions, please call [phone].

## 2014-07-11 NOTE — Progress Notes (Signed)
Report called and given to Malachy Mood, Therapist, sports at [facility].

## 2014-07-11 NOTE — Clinical Social Work Placement (Signed)
   CLINICAL SOCIAL WORK PLACEMENT  NOTE  Date:  07/11/2014  Patient Details  Name: Miguel Hanson MRN: [id number] Date of Birth: 1945-04-26  Clinical Social Work is seeking post-discharge placement for this patient at the [place] level of care (*CSW will initial, date and re-position this form in  chart as items are completed):  Yes   Patient/family provided with [place] Work Department's list of facilities offering this level of care within the geographic area requested by the patient (or if unable, by the patient's family).  Yes   Patient/family informed of their freedom to choose among providers that offer the needed level of care, that participate in Medicare, Medicaid or managed care program needed by the patient, have an available bed and are willing to accept the patient.  Yes   Patient/family informed of [place]'s ownership interest in [facility] and [facility], as well as of the fact that they are under no obligation to receive care at these facilities.  PASRR submitted to EDS on 07/10/14     PASRR number received on       Existing PASRR number confirmed on 07/10/14     FL2 transmitted to all facilities in geographic area requested by pt/family on 07/10/14     FL2 transmitted to all facilities within larger geographic area on       Patient informed that his/her managed care company has contracts with or will negotiate with certain facilities, including the following:   (YES)     Yes   Patient/family informed of bed offers received.  Patient chooses bed at  [facility] )     Physician recommends and patient chooses bed at      Patient to be transferred to  [facility] ) on 07/11/14.  Patient to be transferred to facility by  [facility] )     Patient family notified on 07/11/14 of transfer.  Name of family member notified:   (Pt's son, Nil Xiong )     PHYSICIAN Please sign FL2      Additional Comment:    _______________________________________________ Rozell Searing, LCSW 07/11/2014, 1:46 PM

## 2014-07-11 NOTE — Clinical Social Work Note (Signed)
Clinical Social Worker facilitated patient discharge including contacting patient family(CSW left a message with pt's son/ HCPOA, Ryleigh Buenger and pt's dtr-in-law, Quintavius Niebuhr [phone] in regards to discharge plans for today) and facility to confirm patient discharge plans.  Clinical information faxed to facility and family agreeable with plan.  CSW arranged ambulance transport via [facility] to [facility].  RN to call report prior to discharge.  DC packet prepared and on chart for transport.   Clinical Social Worker will sign off for now as social work intervention is no longer needed. Please consult [place] again if new need arises.  Glendon Axe, MSW, [place] [phone] 07/11/2014 2:07 PM

## 2014-07-11 NOTE — Progress Notes (Signed)
Subjective: This morning, he was lying in bed attempting to use the TV remote control. He had finished breakfast on exam though could not name food items to which I was pointing. He was perseverating on one phrase, "nothing, I don't know."   Objective: Vital signs in last 24 hours: Filed Vitals:   07/10/14 2142 07/11/14 0210 07/11/14 0554 07/11/14 0926  BP: 122/92 142/89 122/72 114/70  Pulse: 104 87 92 84  Temp: 97.7 F (36.5 C) 97.7 F (36.5 C) 97.6 F (36.4 C) 97.7 F (36.5 C)  TempSrc: Oral Oral Oral Oral  Resp: 19 18 18 18   Height:      Weight:      SpO2: 99% 97% 99% 100%   Weight change:   Intake/Output Summary (Last 24 hours) at 07/11/14 1043 Last data filed at 07/11/14 3734  Gross per 24 hour  Intake    723 ml  Output    850 ml  Net   -127 ml   Physical Exam: General: Thin elderly African-American male, alert, cooperative, NAD.  HEENT: PERRL, EOMI. Moist mucus membranes Lungs: Clear to ascultation bilaterally in the posterior lung fields, normal work of respiration, no wheezes, rales, rhonchi Heart: RRR, no murmurs, gallops, or rubs Abdomen: Soft, non-tender, non-distended, BS + Extremities: No cyanosis, clubbing, or edema Neurology: not oriented to name, place, year, but could not name place or year even with prompting. Still unable to follow commands, like wiggling toes or with cerebellar testing. Moving all extremities spontaneously.   Lab Results: Basic Metabolic Panel:  Recent Labs Lab 07/10/14 0558 07/11/14 0409  NA 135 137  K 4.3 4.3  CL 103 101  CO2 18* 27  GLUCOSE 87 97  BUN 9 15  CREATININE 0.71 0.81  CALCIUM 9.1 9.5  MG 1.7 2.1   Liver Function Tests: No results for input(s): AST, ALT, ALKPHOS, BILITOT, PROT, ALBUMIN in the last 168 hours.  Recent Labs Lab 07/04/14 1050  AMMONIA 27   CBC:  Recent Labs Lab 07/05/14 0236 07/11/14 0409  WBC 9.8 11.2*  HGB 12.9* 12.4*  HCT 36.6* 35.4*  MCV 92.0 88.9  PLT 187 189   CBG: No  results for input(s): GLUCAP in the last 168 hours. Coagulation: No results for input(s): LABPROT, INR in the last 168 hours. Urinalysis:  Recent Labs Lab 07/04/14 1044  COLORURINE YELLOW  LABSPEC 1.013  PHURINE 6.5  GLUCOSEU NEGATIVE  HGBUR NEGATIVE  BILIRUBINUR NEGATIVE  KETONESUR NEGATIVE  PROTEINUR NEGATIVE  UROBILINOGEN 0.2  NITRITE NEGATIVE  LEUKOCYTESUR NEGATIVE    Micro Results: Recent Results (from the past 240 hour(s))  MRSA PCR Screening     Status: None   Collection Time: 07/04/14  4:30 PM  Result Value Ref Range Status   MRSA by PCR NEGATIVE NEGATIVE Final    Comment:        The GeneXpert MRSA Assay (FDA approved for NASAL specimens only), is one component of a comprehensive MRSA colonization surveillance program. It is not intended to diagnose MRSA infection nor to guide or monitor treatment for MRSA infections.    Studies/Results: No results found. Medications: I have reviewed the patient's current medications. Scheduled Meds: . enoxaparin (LOVENOX) injection  40 mg Subcutaneous Q24H  . feeding supplement (ENSURE ENLIVE)  237 mL Oral BID BM  . folic acid  1 mg Oral Daily  . levETIRAcetam  1,000 mg Oral BID  . magnesium chloride  2 tablet Oral Daily  . metoprolol tartrate  12.5 mg Oral BID  .  multivitamin with minerals  1 tablet Oral Daily  . OXcarbazepine  600 mg Oral BID  . sodium chloride  3 mL Intravenous Q12H  . thiamine  250 mg Oral Daily   Continuous Infusions:  PRN Meds:.   Assessment/Plan: Miguel Hanson is a 69 year old male with polysubstance abuse [alcohol, marijuana], COPD, seizure disorder, HTN, dCHF, and severe protein calorie malnutrition, admitted for acute on chronic encephalopathy.  Acute on chronic encephalopathy: Most likely multifactorial related to post-ictal state vs alcohol withdrawal. EEG 07/06/14 suggestive of diffuse background slowing, focal slowing in posterior quadrants, L>R. Also shows periodic lateralized discharges  in left posterior quadrant, somewhat suggestive of recent clinical focal seizures or post-ictal state following status epilepticus. it appears that he may still have uncontrolled seizure activity from an irritable focus noted on EEG repeated yesterday. Neurology recommended he continue his current antiepileptic regimen with follow-up in 2 weeks with possible follow-up at a tertiary care center with an epilepsy monitoring unit to capture events which may not have been caught in the 2 EEGs performed here on this admission. -Continue Keppra 1000 mg twice daily -Continue Trileptal 600 mg twice daily; CBC and BMET otherwise reassuring this morning -Continue thiamine 250 mg daily -Continue folic acid 1 mg  -Keep Ativan 1-2 mg every 4 hours as needed for seizure and continue seizure precautions   Polysubstance abuse: UDS notable for marijuana on admission. Extensive history of alcohol abuse as noted in the chart and corroborated by several family members.  -Continue advising patient against substance abuse  Hypomagnesemia: Magnesium 2.1 this morning, improved from 1.4 on admission and yesterday with Slow-Mag. -Discharge on magnesium supplementation  Chronic dCHF: EF 55-60% with grade 1 diastolic dysfunction as noted on echo 05/08/14. -Continue to monitor  Severe Protein Calorie Malnutrition: Continue Ensure  HTN: Mostly normotensive overnight.   -Continue metoprolol 12.5 mg bid  DVT/PE PPx: Lovenox  CODE STATUS: FULL CODE  Dispo: Disposition is deferred at this time, awaiting improvement of current medical problems followed by SNF placement.   The patient does not know have a current PCP [facility]) and does not know need an [facility] hospital follow-up appointment after discharge.  The patient does have transportation limitations that hinder transportation to clinic appointments.  .Services Needed at time of discharge: Y = Yes, Blank = No PT:   OT:   RN:   Equipment:   Other:      LOS: 7 days   Miguel Dubin, MD 07/11/2014, 10:43 AM

## 2014-07-11 NOTE — Progress Notes (Signed)
Patient DC'd to [facility] via [facility].  Report called.  Vital signs and assessments were stable.

## 2014-07-11 NOTE — Progress Notes (Signed)
  PROGRESS NOTE MEDICINE TEACHING ATTENDING   Day 7 of stay Patient name: Miguel Hanson   Medical record number: [id number] Date of birth: August 30, 1945   Patient more conversant today than yesterday.  Blood pressure 114/70, pulse 84, temperature 97.7 F (36.5 C), temperature source Oral, resp. rate 18, height 5\' 7"  (1.702 m), weight 127 lb 13.9 oz (58 kg), SpO2 100 %. The patient is alert and oriented, comfortable, in no acute distress. PERRL, EOMI. Heart exhibits regular rate and rhythm, no murmurs. Lungs are clear to auscultation. Abdomen is soft and non-tender. There is no pedal edema and good pedal pulses. There are no gross focal neurological deficits apparent except expressive aphasia.   Assessment/Plan Currently on Keppra, Trileptal (Vimpat discontinued) Thiamine and Folic Acid to be continued.  Follow up with neurology as outpatient SNF placement likely.   I have discussed the care of this patient with my IM team residents. Please see the resident note for details.  [place], [place] 07/11/2014, 12:14 PM.

## 2014-07-26 ENCOUNTER — Ambulatory Visit: Payer: Medicare Other | Admitting: Neurology

## 2014-08-07 ENCOUNTER — Ambulatory Visit (INDEPENDENT_AMBULATORY_CARE_PROVIDER_SITE_OTHER): Payer: Medicare Other | Admitting: Neurology

## 2014-08-07 ENCOUNTER — Encounter: Payer: Self-pay | Admitting: Neurology

## 2014-08-07 VITALS — BP 152/96 | HR 78 | Temp 97.7°F | Ht 67.0 in | Wt 124.0 lb

## 2014-08-07 DIAGNOSIS — I639 Cerebral infarction, unspecified: Secondary | ICD-10-CM

## 2014-08-07 DIAGNOSIS — F4310 Post-traumatic stress disorder, unspecified: Secondary | ICD-10-CM | POA: Diagnosis not present

## 2014-08-07 DIAGNOSIS — I6381 Other cerebral infarction due to occlusion or stenosis of small artery: Secondary | ICD-10-CM

## 2014-08-07 DIAGNOSIS — R569 Unspecified convulsions: Secondary | ICD-10-CM | POA: Diagnosis not present

## 2014-08-07 DIAGNOSIS — F1910 Other psychoactive substance abuse, uncomplicated: Secondary | ICD-10-CM | POA: Diagnosis not present

## 2014-08-07 DIAGNOSIS — E785 Hyperlipidemia, unspecified: Secondary | ICD-10-CM

## 2014-08-07 DIAGNOSIS — Z91199 Patient's noncompliance with other medical treatment and regimen due to unspecified reason: Secondary | ICD-10-CM

## 2014-08-07 DIAGNOSIS — Z9119 Patient's noncompliance with other medical treatment and regimen: Secondary | ICD-10-CM

## 2014-08-07 DIAGNOSIS — R7302 Impaired glucose tolerance (oral): Secondary | ICD-10-CM | POA: Diagnosis not present

## 2014-08-07 DIAGNOSIS — F10939 Alcohol use, unspecified with withdrawal, unspecified: Secondary | ICD-10-CM

## 2014-08-07 DIAGNOSIS — F10239 Alcohol dependence with withdrawal, unspecified: Secondary | ICD-10-CM | POA: Diagnosis not present

## 2014-08-07 NOTE — Progress Notes (Signed)
[facility]    Provider:  Dr Jaynee Eagles Referring Provider: [facility] Physician:  [facility]  CC:  Seizures, long history of alcohol abuse and social issues  HPI:  Camille Thau is a 69 y.o. male here as a referral from Dr. Domingo Madeira for seizures. He has a past medical history of seizure disorder, alcohol and polysubstance abuse, posttraumatic stress disorder, COPD, malnutrition, hepatic encephalopathy, alcohol withdrawal. He is S/P multiple visits to the emergency department and admissions for altered mental status, alcohol withdrawal and seizures. He has as a long history of alcohol abuse and notes have stated that seizures may be at least partially due to etoh withdrawal but EEG does show an irritable area which could be causing seizure activity. Looking back in past notes there is also a question of underlying Wernicke's encephalopathy/Korsakoff syndrome. He is on Keppra and Trileptal per records but he doesn' t know what he takes at all. He takes a few pills in the morning, afternoon and in the evening but doesn' t know what they are or what doses. No one helps with medications.  Lives with wife independently. He is not driving, last drove years ago and doesn't have a license anymore.   Last seizure he had was a month ago. Per patient and nephew, he has a history of alcohol abuse and he has the seizures only after he drinks. He is here with nephew who provides significant information. He states he hasn't had a drink since last month. He has had quite a few seizures. Seizures started last year (notes show admissions for alcohol withdrawal with seizure going back to at least 2012). No inciting events. Nephew has seen one seizure, patient laid back and was shaking, eyes open, episode was a few minutes and afterwards patient was confused. He had drank the night before, he drinks a lot of shots a day. Notes state that he would drink a pint of hard  liquor a night.  Reviewed notes, labs and imaging from outside physicians, Last admission was in early May which showed: He was diagnosed with acute on chronic encephalopathy most likely multifactorial related to postictal state as well as alcohol withdrawal. UDS was also notable for marijuana use. EEG showed no epileptiform activity and continued periodic sharp wave discharges in left parietal temporal region. Previous to this he was admitted in March of this year for similar presentation of altered mental status and had 2 EEGs which demonstrated left hemispheric focal slowing and intermittent periodic sharp discharges of triphasic morphology. At the admission in March, his confusion never really fully cleared and there was  a question of Wenicke's  encephalopathy or Korsakoff syndrome. MRIs were negative for stroke. He was admitted multiple times previous for similar presentations such as in December 2015, September 2015, and multiple other admissions dating back to 2012.  FINDINGS: No evidence for acute infarction, hemorrhage, mass lesion, hydrocephalus, or extra-axial fluid. Extensive cerebral and cerebellar atrophy with chronic microvascular ischemic change affecting the periventricular and subcortical white matter. Remote RIGHT pontine lacunar infarct. Pituitary, pineal, and cerebellar tonsils unremarkable. No upper cervical lesions. Flow voids are maintained throughout the carotid, basilar, and vertebral arteries. There are no areas of chronic hemorrhage.  Thin section imaging through the temporal lobes demonstrates no inflammatory or destructive lesion. Extracranial soft tissues grossly unremarkable.  Compared with priors, a similar appearance is noted.  IMPRESSION: Atrophy and small vessel disease. No acute intracranial findings. Stable appearance from priors.   Review  of Systems: Patient complains of symptoms per HPI as well as the following symptoms: memory loss,  confusion,slurred speech, aching muscles, change in appetite. Pertinent negatives per HPI. All others negative.   History   Social History  . Marital Status: Married    Spouse Name: [name]   . Number of Children: 3  . Years of Education: 12   Occupational History  . Not on file.   Social History Main Topics  . Smoking status: Current Some Day Smoker -- 0.50 packs/day for 49 years    Types: Cigarettes  . Smokeless tobacco: Current User  . Alcohol Use: 21.6 oz/week    36 Shots of liquor per week     Comment: 03/01/2013 "drink a pint of brandy q 2 days" "Drinks alcohol often"  . Drug Use: Yes    Special: Marijuana     Comment: per spouse per ems ":1 joint per day"  . Sexual Activity: No   Other Topics Concern  . Not on file   Social History Narrative   Lives at home with wife.   Caffeine use: Drinks coffee "Every now and then"    Family History  Problem Relation Age of Onset  . Hypertension Mother   . Hypertension Father     Past Medical History  Diagnosis Date  . Hypertension   . Seizures   . ETOH abuse   . Drug abuse   . PTSD (post-traumatic stress disorder)   . H/O prolonged Q-T interval on ECG   . Polysubstance abuse   . Alcohol abuse   . Depression     Past Surgical History  Procedure Laterality Date  . No past surgeries      Current Outpatient Prescriptions  Medication Sig Dispense Refill  . feeding supplement, ENSURE COMPLETE, (ENSURE COMPLETE) LIQD Take 237 mLs by mouth 2 (two) times daily between meals. (Patient taking differently: Take 237 mLs by mouth 2 (two) times daily between meals. Drinks occasionally) 60 Bottle 3  . folic acid (FOLVITE) 1 MG tablet Take 1 tablet (1 mg total) by mouth daily. 30 tablet 3  . levETIRAcetam (KEPPRA) 500 MG tablet Take 2 tablets (1,000 mg total) by mouth 2 (two) times daily. 60 tablet 3  . metoprolol tartrate (LOPRESSOR) 25 MG tablet Take 0.5 tablets (12.5 mg total) by mouth 2 (two) times daily. 60 tablet 3  .  magnesium oxide (MAG-OX) 400 (241.3 MG) MG tablet Take 1 tablet (400 mg total) by mouth daily. 30 tablet 3  . Multiple Vitamin (MULTIVITAMIN WITH MINERALS) TABS tablet Take 1 tablet by mouth daily. 30 tablet 0  . Oxcarbazepine (TRILEPTAL) 600 MG tablet Take 1 tablet (600 mg total) by mouth 2 (two) times daily. 60 tablet 0  . traZODone (DESYREL) 50 MG tablet Take 1 tablet (50 mg total) by mouth at bedtime as needed for sleep. 30 tablet 0   No current facility-administered medications for this visit.    Allergies as of 08/07/2014  . (No Known Allergies)    Vitals: BP 152/96 mmHg  Pulse 78  Temp(Src) 97.7 F (36.5 C)  Ht 5\' 7"  (1.702 m)  Wt 124 lb (56.246 kg)  BMI 19.42 kg/m2 Last Weight:  Wt Readings from Last 1 Encounters:  08/07/14 124 lb (56.246 kg)   Last Height:   Ht Readings from Last 1 Encounters:  08/07/14 5\' 7"  (1.702 m)    Physical exam: Exam: Gen: NAD, thin, poorly groomed, edentulous  CV: RRR, no MRG. No Carotid Bruits. No peripheral edema, warm, nontender Eyes: Conjunctivae clear without exudates or hemorrhage  Neuro: Detailed Neurologic Exam  Speech: No aphasia or dysarthria     Cognition:    The patient is oriented to person, place, and time;     recent and remote memory impaired;     language fluent;     Impaired attention, concentration,     fund of knowledge impaired Cranial Nerves:    The pupils are equal, round, and reactive to light. The fundi are flat. Visual fields are full to finger confrontation. Extraocular movements are intact. Trigeminal sensation is intact and the muscles of mastication are normal. The face is symmetric. The palate elevates in the midline. Hearing intact. Voice is normal. Shoulder shrug is normal. The tongue has normal motion without fasciculations.   Coordination:    No dysmetria  Gait:    Heel-toe are normal.   Motor Observation:    no involuntary movements noted. Tone:    Normal muscle tone.     Posture:    Posture is normal. normal erect    Strength:    Strength is V/V in the upper and lower limbs.      Sensation: intact to LT     Reflex Exam:  DTR's: Absent patellar is otherwise deep tendon reflexes in the upper and lower extremities are normal bilaterally.   Toes:    The toes are downgoing bilaterally.   Clonus:    Clonus is absent.      Assessment/Plan:   Aurther Harlin is a 69 y.o. male here as a referral from Dr. Domingo Madeira for seizures. He has a past medical history of seizure disorder, alcohol and polysubstance abuse, posttraumatic stress disorder, COPD, malnutrition, hepatic encephalopathy, alcohol withdrawal. He is S/P multiple visits to the emergency department at least as far back as 2012 and admissions for altered mental status, alcohol withdrawal and seizures. Notes have stated that seizures are at least partially due to etoh withdrawal but EEG does show an irritable area which could be causing underlying seizure activity. Looking back in past notes there is also a question of underlying Wernicke's  encephalopathy/Korsakoff syndrome.  Patient has a very complicated past medical history as well as obvious social issues. He follows at the [facility] and has been admitted there multiple times as well per her nephew and patient. Patient has an appointment at the [facility] coming and I also encouraged him to follow-up with neurology there. I do recommend EMU admission, and possibly the [facility] is the best organization to deal with his very complicated social, polysubstance abuse and health issues. I advised the nephew and patient to request copies of patient's records and bring them to the [facility] with him. He is also likely noncompliant with medications as he has no idea what medications he is taking. Patient needs a lot of social support. Luckily the [facility] [place] is fully equipped and very experienced with issues like this  - Will check AED levels today -Continue Keppra 1000 mg twice daily -Continue  Trileptal 600 mg twice daily -Continue thiamine 250 mg daily -Continue folic acid 1 mg  -Daily ASA 81mg  for stroke prevention -Follow closely with pcp for control of vascular risk factors -In follow-up here in clinic with me or with the [facility], suggest both - No driving or performing any tasks that could hurt patient or others should he have a seizure - Stressed compliance, discussed with nephew that patient likely needs a lot more social  support and he is getting, they need to go to the [facility] with patient and see if he can get services to help him.  Sarina Ill, MD  [facility] [address] [place], [place] [address]  Phone [phone] Fax [phone]

## 2014-08-09 ENCOUNTER — Telehealth: Payer: Self-pay | Admitting: *Deleted

## 2014-08-09 LAB — LIPID PANEL
CHOL/HDL RATIO: 2.9 ratio (ref 0.0–5.0)
CHOLESTEROL TOTAL: 216 mg/dL — AB (ref 100–199)
HDL: 74 mg/dL (ref >39)
LDL CALC: 124 mg/dL — AB (ref 0–99)
Triglycerides: 92 mg/dL (ref 0–149)
VLDL Cholesterol Cal: 18 mg/dL (ref 5–40)

## 2014-08-09 LAB — LEVETIRACETAM LEVEL: LEVETIRACETAM: 25 ug/mL (ref 10.0–40.0)

## 2014-08-09 LAB — 10-HYDROXYCARBAZEPINE: OXCARBAZEPINE SERPL-MCNC: NOT DETECTED ug/mL (ref 10–35)

## 2014-08-09 LAB — HEMOGLOBIN A1C
ESTIMATED AVERAGE GLUCOSE: 105 mg/dL
Hgb A1c MFr Bld: 5.3 % (ref 4.8–5.6)

## 2014-08-09 NOTE — Telephone Encounter (Signed)
Spoke with wife about lab results. Told her patients Keppra level was within normal range. Told her it does not look like he is taking the Trileptal (600mg - 2 times daily) based on his lab work and needs to make sure he has this prescription and is taking it. I told her Dr. Posey Pronto was the person who prescribed it and started him on it on 07/11/14 and to call his office if they have any questions. Also told her his cholesterol level was high and Dr. Jaynee Eagles recommends a statin but he needs to f/u with his PCP at the [facility] about that and his high LDL which was 124. Told her it needed to be less than 70. She said she would call and make an appt with his PCP at the [facility] to f/u about this. I told her to call back if she had any more questions. She verbalized understanding.

## 2014-08-09 NOTE — Telephone Encounter (Signed)
Left message for pt to call back. Gave [facility] phone number and office hours. Calling about results.

## 2014-11-07 ENCOUNTER — Telehealth: Payer: Self-pay | Admitting: *Deleted

## 2014-11-07 ENCOUNTER — Encounter: Payer: Self-pay | Admitting: Neurology

## 2014-11-07 ENCOUNTER — Ambulatory Visit: Payer: Medicare Other | Admitting: Neurology

## 2014-11-07 NOTE — Telephone Encounter (Signed)
no showed f/u appt

## 2014-12-27 ENCOUNTER — Encounter (HOSPITAL_COMMUNITY): Payer: Self-pay | Admitting: *Deleted

## 2014-12-27 ENCOUNTER — Emergency Department (HOSPITAL_COMMUNITY): Payer: Medicare Other

## 2014-12-27 ENCOUNTER — Inpatient Hospital Stay (HOSPITAL_COMMUNITY)
Admission: EM | Admit: 2014-12-27 | Discharge: 2014-12-31 | DRG: 064 | Disposition: A | Discharge status: Home / Self Care | Payer: Medicare Other | Attending: Internal Medicine | Admitting: Internal Medicine

## 2014-12-27 DIAGNOSIS — R531 Weakness: Secondary | ICD-10-CM | POA: Diagnosis present

## 2014-12-27 DIAGNOSIS — F10239 Alcohol dependence with withdrawal, unspecified: Secondary | ICD-10-CM | POA: Diagnosis present

## 2014-12-27 DIAGNOSIS — R4701 Aphasia: Secondary | ICD-10-CM | POA: Diagnosis not present

## 2014-12-27 DIAGNOSIS — J410 Simple chronic bronchitis: Secondary | ICD-10-CM

## 2014-12-27 DIAGNOSIS — J449 Chronic obstructive pulmonary disease, unspecified: Secondary | ICD-10-CM | POA: Diagnosis present

## 2014-12-27 DIAGNOSIS — I6789 Other cerebrovascular disease: Secondary | ICD-10-CM | POA: Diagnosis not present

## 2014-12-27 DIAGNOSIS — D696 Thrombocytopenia, unspecified: Secondary | ICD-10-CM | POA: Diagnosis present

## 2014-12-27 DIAGNOSIS — E43 Unspecified severe protein-calorie malnutrition: Secondary | ICD-10-CM | POA: Diagnosis present

## 2014-12-27 DIAGNOSIS — I10 Essential (primary) hypertension: Secondary | ICD-10-CM | POA: Diagnosis present

## 2014-12-27 DIAGNOSIS — R29706 NIHSS score 6: Secondary | ICD-10-CM | POA: Diagnosis present

## 2014-12-27 DIAGNOSIS — F17210 Nicotine dependence, cigarettes, uncomplicated: Secondary | ICD-10-CM | POA: Diagnosis present

## 2014-12-27 DIAGNOSIS — Z681 Body mass index (BMI) 19 or less, adult: Secondary | ICD-10-CM

## 2014-12-27 DIAGNOSIS — N390 Urinary tract infection, site not specified: Secondary | ICD-10-CM | POA: Diagnosis not present

## 2014-12-27 DIAGNOSIS — G8384 Todd's paralysis (postepileptic): Secondary | ICD-10-CM | POA: Diagnosis present

## 2014-12-27 DIAGNOSIS — E785 Hyperlipidemia, unspecified: Secondary | ICD-10-CM | POA: Diagnosis present

## 2014-12-27 DIAGNOSIS — F4310 Post-traumatic stress disorder, unspecified: Secondary | ICD-10-CM | POA: Diagnosis present

## 2014-12-27 DIAGNOSIS — E512 Wernicke's encephalopathy: Secondary | ICD-10-CM | POA: Diagnosis present

## 2014-12-27 DIAGNOSIS — F329 Major depressive disorder, single episode, unspecified: Secondary | ICD-10-CM | POA: Diagnosis present

## 2014-12-27 DIAGNOSIS — I639 Cerebral infarction, unspecified: Secondary | ICD-10-CM | POA: Diagnosis present

## 2014-12-27 DIAGNOSIS — E871 Hypo-osmolality and hyponatremia: Secondary | ICD-10-CM | POA: Diagnosis present

## 2014-12-27 DIAGNOSIS — E440 Moderate protein-calorie malnutrition: Secondary | ICD-10-CM | POA: Diagnosis not present

## 2014-12-27 DIAGNOSIS — I6329 Cerebral infarction due to unspecified occlusion or stenosis of other precerebral arteries: Secondary | ICD-10-CM | POA: Diagnosis not present

## 2014-12-27 DIAGNOSIS — G9341 Metabolic encephalopathy: Secondary | ICD-10-CM | POA: Diagnosis present

## 2014-12-27 DIAGNOSIS — R569 Unspecified convulsions: Secondary | ICD-10-CM | POA: Diagnosis not present

## 2014-12-27 DIAGNOSIS — G9340 Encephalopathy, unspecified: Secondary | ICD-10-CM | POA: Diagnosis not present

## 2014-12-27 DIAGNOSIS — F1290 Cannabis use, unspecified, uncomplicated: Secondary | ICD-10-CM | POA: Diagnosis present

## 2014-12-27 DIAGNOSIS — G40909 Epilepsy, unspecified, not intractable, without status epilepticus: Secondary | ICD-10-CM | POA: Diagnosis present

## 2014-12-27 DIAGNOSIS — R4182 Altered mental status, unspecified: Secondary | ICD-10-CM | POA: Diagnosis not present

## 2014-12-27 DIAGNOSIS — Z79899 Other long term (current) drug therapy: Secondary | ICD-10-CM | POA: Diagnosis not present

## 2014-12-27 LAB — URINALYSIS, ROUTINE W REFLEX MICROSCOPIC
Bilirubin Urine: NEGATIVE
GLUCOSE, UA: NEGATIVE mg/dL
HGB URINE DIPSTICK: NEGATIVE
KETONES UR: NEGATIVE mg/dL
Leukocytes, UA: NEGATIVE
NITRITE: POSITIVE — AB
PH: 7 (ref 5.0–8.0)
Protein, ur: 30 mg/dL — AB
Specific Gravity, Urine: 1.011 (ref 1.005–1.030)
Urobilinogen, UA: 1 mg/dL (ref 0.0–1.0)

## 2014-12-27 LAB — COMPREHENSIVE METABOLIC PANEL
ALT: 14 U/L — ABNORMAL LOW (ref 17–63)
AST: 26 U/L (ref 15–41)
Albumin: 3.9 g/dL (ref 3.5–5.0)
Alkaline Phosphatase: 67 U/L (ref 38–126)
Anion gap: 10 (ref 5–15)
BUN: <5 mg/dL — ABNORMAL LOW (ref 6–20)
CHLORIDE: 100 mmol/L — AB (ref 101–111)
CO2: 23 mmol/L (ref 22–32)
CREATININE: 0.75 mg/dL (ref 0.61–1.24)
Calcium: 9 mg/dL (ref 8.9–10.3)
Glucose, Bld: 112 mg/dL — ABNORMAL HIGH (ref 65–99)
POTASSIUM: 4 mmol/L (ref 3.5–5.1)
Sodium: 133 mmol/L — ABNORMAL LOW (ref 135–145)
Total Bilirubin: 0.9 mg/dL (ref 0.3–1.2)
Total Protein: 7.5 g/dL (ref 6.5–8.1)

## 2014-12-27 LAB — DIFFERENTIAL
BASOS PCT: 0 %
Basophils Absolute: 0 10*3/uL (ref 0.0–0.1)
EOS ABS: 0.4 10*3/uL (ref 0.0–0.7)
EOS PCT: 6 %
Lymphocytes Relative: 40 %
Lymphs Abs: 2.9 10*3/uL (ref 0.7–4.0)
MONO ABS: 0.5 10*3/uL (ref 0.1–1.0)
MONOS PCT: 7 %
Neutro Abs: 3.4 10*3/uL (ref 1.7–7.7)
Neutrophils Relative %: 47 %

## 2014-12-27 LAB — CBC
HEMATOCRIT: 42 % (ref 39.0–52.0)
Hemoglobin: 15.9 g/dL (ref 13.0–17.0)
MCH: 35.9 pg — ABNORMAL HIGH (ref 26.0–34.0)
MCHC: 37.9 g/dL — ABNORMAL HIGH (ref 30.0–36.0)
MCV: 94.8 fL (ref 78.0–100.0)
PLATELETS: 125 10*3/uL — AB (ref 150–400)
RBC: 4.43 MIL/uL (ref 4.22–5.81)
RDW: 13 % (ref 11.5–15.5)
WBC: 7.3 10*3/uL (ref 4.0–10.5)

## 2014-12-27 LAB — AMMONIA: Ammonia: 26 umol/L (ref 9–35)

## 2014-12-27 LAB — I-STAT TROPONIN, ED: TROPONIN I, POC: 0.01 ng/mL (ref 0.00–0.08)

## 2014-12-27 LAB — APTT: aPTT: 33 seconds (ref 24–37)

## 2014-12-27 LAB — CBG MONITORING, ED: GLUCOSE-CAPILLARY: 119 mg/dL — AB (ref 65–99)

## 2014-12-27 LAB — URINE MICROSCOPIC-ADD ON

## 2014-12-27 LAB — PROTIME-INR
INR: 1.15 (ref 0.00–1.49)
PROTHROMBIN TIME: 14.9 s (ref 11.6–15.2)

## 2014-12-27 LAB — I-STAT CHEM 8, ED
BUN: 5 mg/dL — AB (ref 6–20)
CREATININE: 0.8 mg/dL (ref 0.61–1.24)
Calcium, Ion: 1.14 mmol/L (ref 1.13–1.30)
Chloride: 101 mmol/L (ref 101–111)
GLUCOSE: 108 mg/dL — AB (ref 65–99)
HEMATOCRIT: 51 % (ref 39.0–52.0)
HEMOGLOBIN: 17.3 g/dL — AB (ref 13.0–17.0)
Potassium: 3.9 mmol/L (ref 3.5–5.1)
SODIUM: 138 mmol/L (ref 135–145)
TCO2: 24 mmol/L (ref 0–100)

## 2014-12-27 LAB — ETHANOL: Alcohol, Ethyl (B): <5 mg/dL (ref <5)

## 2014-12-27 LAB — RAPID URINE DRUG SCREEN, HOSP PERFORMED
AMPHETAMINES: NOT DETECTED
BARBITURATES: NOT DETECTED
Benzodiazepines: NOT DETECTED
COCAINE: NOT DETECTED
Opiates: NOT DETECTED
TETRAHYDROCANNABINOL: POSITIVE — AB

## 2014-12-27 MED ORDER — ACETAMINOPHEN 650 MG RE SUPP
650.0000 mg | RECTAL | Status: DC | PRN
  Administered 2014-12-28: 650 mg via RECTAL
  Filled 2014-12-27: qty 1

## 2014-12-27 MED ORDER — LEVETIRACETAM 750 MG PO TABS: 1500.0000 mg | ORAL_TABLET | Freq: Two times a day (BID) | ORAL | Status: DC

## 2014-12-27 MED ORDER — ADULT MULTIVITAMIN W/MINERALS CH
1.0000 | ORAL_TABLET | Freq: Every day | ORAL | Status: DC
  Administered 2014-12-28 – 2014-12-31 (×4): 1 via ORAL
  Filled 2014-12-27 (×4): qty 1

## 2014-12-27 MED ORDER — FOLIC ACID 1 MG PO TABS
1.0000 mg | ORAL_TABLET | Freq: Every day | ORAL | Status: DC
  Administered 2014-12-28 – 2014-12-31 (×4): 1 mg via ORAL
  Filled 2014-12-27 (×4): qty 1

## 2014-12-27 MED ORDER — METOPROLOL TARTRATE 12.5 MG HALF TABLET
12.5000 mg | ORAL_TABLET | Freq: Two times a day (BID) | ORAL | Status: DC
  Administered 2014-12-28 – 2014-12-31 (×7): 12.5 mg via ORAL
  Filled 2014-12-27 (×7): qty 1

## 2014-12-27 MED ORDER — ASPIRIN 300 MG RE SUPP: 300.0000 mg | Freq: Every day | RECTAL | Status: DC

## 2014-12-27 MED ORDER — STROKE: EARLY STAGES OF RECOVERY BOOK
Freq: Once | Status: AC
  Administered 2014-12-28: 02:00:00
  Filled 2014-12-27: qty 1

## 2014-12-27 MED ORDER — OXCARBAZEPINE 300 MG PO TABS
600.0000 mg | ORAL_TABLET | Freq: Two times a day (BID) | ORAL | Status: DC
  Administered 2014-12-28 – 2014-12-31 (×7): 600 mg via ORAL
  Filled 2014-12-27 (×9): qty 2

## 2014-12-27 MED ORDER — ENOXAPARIN SODIUM 40 MG/0.4ML ~~LOC~~ SOLN
40.0000 mg | SUBCUTANEOUS | Status: DC
  Administered 2014-12-28 – 2014-12-31 (×5): 40 mg via SUBCUTANEOUS
  Filled 2014-12-27 (×5): qty 0.4

## 2014-12-27 MED ORDER — ATORVASTATIN CALCIUM 40 MG PO TABS
40.0000 mg | ORAL_TABLET | Freq: Every day | ORAL | Status: DC
  Administered 2014-12-28 – 2014-12-30 (×3): 40 mg via ORAL
  Filled 2014-12-27 (×3): qty 1

## 2014-12-27 MED ORDER — LORAZEPAM 2 MG/ML IJ SOLN: 2.0000 mg | INTRAMUSCULAR | Status: DC | PRN

## 2014-12-27 MED ORDER — THIAMINE HCL 100 MG/ML IJ SOLN
100.0000 mg | Freq: Once | INTRAMUSCULAR | Status: AC
  Administered 2014-12-27: 100 mg via INTRAVENOUS
  Filled 2014-12-27: qty 2

## 2014-12-27 MED ORDER — THIAMINE HCL 100 MG/ML IJ SOLN
100.0000 mg | Freq: Every day | INTRAMUSCULAR | Status: DC
  Administered 2014-12-28: 100 mg via INTRAVENOUS
  Filled 2014-12-27: qty 2

## 2014-12-27 MED ORDER — ASPIRIN 325 MG PO TABS
325.0000 mg | ORAL_TABLET | Freq: Every day | ORAL | Status: DC
  Administered 2014-12-28 – 2014-12-31 (×4): 325 mg via ORAL
  Filled 2014-12-27 (×4): qty 1

## 2014-12-27 MED ORDER — ACETAMINOPHEN 325 MG PO TABS: 650.0000 mg | ORAL_TABLET | ORAL | Status: DC | PRN

## 2014-12-27 NOTE — ED Provider Notes (Signed)
CSN: [id number]     Arrival date & time 12/27/14  2058 History   First MD Initiated Contact with Patient 12/27/14 2100     Chief Complaint  Patient presents with  . Code Stroke   Level V caveat due to altered mental status. $RemoveBefore'@EDPCLEARED'JCAUnlltsqGNO$ @ (Consider location/radiation/quality/duration/timing/severity/associated sxs/prior Treatment) The history is provided by the EMS personnel.   patient came in as a code stroke. Reportedly at 6:30 PM patient had gone out to sit on the porch with his son. Sometime after that became more confused. Having difficulty moving the right side. Family is not with EMS. Upon arrival to ER I met him immediately in CT scanner. He will not move the right side. Will answer some questions but mostly with yes and no not that necessarily appropriately.  Past Medical History  Diagnosis Date  . Hypertension   . Seizures ([place])   . ETOH abuse   . Drug abuse   . PTSD (post-traumatic stress disorder)   . H/O prolonged Q-T interval on ECG   . Polysubstance abuse   . Alcohol abuse   . Depression    Past Surgical History  Procedure Laterality Date  . No past surgeries     Family History  Problem Relation Age of Onset  . Hypertension Mother   . Hypertension Father    Social History  Substance Use Topics  . Smoking status: Current Some Day Smoker -- 0.50 packs/day for 49 years    Types: Cigarettes  . Smokeless tobacco: Current User  . Alcohol Use: 21.6 oz/week    36 Shots of liquor per week     Comment: 03/01/2013 "drink a pint of brandy q 2 days" "Drinks alcohol often"    Review of Systems  Unable to perform ROS: Mental status change      Allergies  Review of patient's allergies indicates no known allergies.  Home Medications   Prior to Admission medications   Medication Sig Start Date End Date Taking? Authorizing Provider  feeding supplement, ENSURE COMPLETE, (ENSURE COMPLETE) LIQD Take 237 mLs by mouth 2 (two) times daily between meals. Patient taking  differently: Take 237 mLs by mouth 2 (two) times daily between meals. Drinks occasionally 05/21/14   Florencia Reasons, MD  folic acid (FOLVITE) 1 MG tablet Take 1 tablet (1 mg total) by mouth daily. 05/21/14   Florencia Reasons, MD  levETIRAcetam (KEPPRA) 500 MG tablet Take 2 tablets (1,000 mg total) by mouth 2 (two) times daily. 07/11/14   Rushil Sherrye Payor, MD  magnesium oxide (MAG-OX) 400 (241.3 MG) MG tablet Take 1 tablet (400 mg total) by mouth daily. 05/21/14   Florencia Reasons, MD  metoprolol tartrate (LOPRESSOR) 25 MG tablet Take 0.5 tablets (12.5 mg total) by mouth 2 (two) times daily. 05/21/14   Florencia Reasons, MD  Multiple Vitamin (MULTIVITAMIN WITH MINERALS) TABS tablet Take 1 tablet by mouth daily. 07/11/14   Rushil Sherrye Payor, MD  Oxcarbazepine (TRILEPTAL) 600 MG tablet Take 1 tablet (600 mg total) by mouth 2 (two) times daily. 07/11/14   Rushil Sherrye Payor, MD  traZODone (DESYREL) 50 MG tablet Take 1 tablet (50 mg total) by mouth at bedtime as needed for sleep. 07/11/14   Rushil Sherrye Payor, MD   BP 183/97 mmHg  Pulse 78  Temp(Src) 98.3 F (36.8 C) (Oral)  Resp 13  SpO2 98% Physical Exam  Constitutional: He appears well-developed.  HENT:  Head: Atraumatic.  Eyes: EOM are normal.  Cardiovascular: Normal rate.   Pulmonary/Chest: Effort normal.  Abdominal: There is no tenderness.  Musculoskeletal: He exhibits no tenderness.  Neurological:  Patient initially with face symmetric but rather flaccid on the right side. Will answer yes or no to some questions and will follow some commands but is not necessarily appropriate. Will look to left or right. Will move left and right extremities but will not move right. Later patient was slightly more confused but would move right side.  Skin: Skin is warm.    ED Course  Procedures (including critical care time) Labs Review Labs Reviewed  CBC - Abnormal; Notable for the following:    MCH 35.9 (*)    MCHC 37.9 (*)    Platelets 125 (*)    All other components within normal limits   COMPREHENSIVE METABOLIC PANEL - Abnormal; Notable for the following:    Sodium 133 (*)    Chloride 100 (*)    Glucose, Bld 112 (*)    BUN <5 (*)    ALT 14 (*)    All other components within normal limits  CBG MONITORING, ED - Abnormal; Notable for the following:    Glucose-Capillary 119 (*)    All other components within normal limits  I-STAT CHEM 8, ED - Abnormal; Notable for the following:    BUN 5 (*)    Glucose, Bld 108 (*)    Hemoglobin 17.3 (*)    All other components within normal limits  PROTIME-INR  APTT  DIFFERENTIAL  AMMONIA  ETHANOL  URINE RAPID DRUG SCREEN, HOSP PERFORMED  URINALYSIS, ROUTINE W REFLEX MICROSCOPIC (NOT AT [facility])  LEVETIRACETAM LEVEL  I-STAT TROPOININ, ED    Imaging Review Ct Head Wo Contrast  12/27/2014  CLINICAL DATA:  Code stroke. Garbled speech, acute onset. Initial encounter. EXAM: CT HEAD WITHOUT CONTRAST TECHNIQUE: Contiguous axial images were obtained from the base of the skull through the vertex without intravenous contrast. COMPARISON:  MRI of the brain and CT of the head performed 07/04/2014 FINDINGS: There is no evidence of acute infarction, mass lesion, or intra- or extra-axial hemorrhage on CT. Prominence of the sulci suggests mild cortical volume loss. Scattered periventricular and subcortical white matter change likely reflects small vessel ischemic microangiopathy. Mild cerebellar atrophy is noted. The brainstem and fourth ventricle are within normal limits. The third and lateral ventricles, and basal ganglia are unremarkable in appearance. The cerebral hemispheres demonstrate grossly normal gray-white differentiation. No mass effect or midline shift is seen. There is no evidence of fracture; visualized osseous structures are unremarkable in appearance. The orbits are within normal limits. There is opacification of the left frontal sinus. The remaining paranasal sinuses and mastoid air cells are well-aerated. No significant soft tissue  abnormalities are seen. IMPRESSION: 1. No acute intracranial pathology seen on CT. 2. Mild cortical volume loss and scattered small vessel ischemic microangiopathy. 3. Opacification of the left frontal sinus. These results were called by telephone at the time of interpretation on 12/27/2014 at 9:21 pm to Dr. Davonna Belling, who verbally acknowledged these results. Electronically Signed   By: Garald Balding [name]   On: 12/27/2014 21:23   Dg Chest Portable 1 View  12/27/2014  CLINICAL DATA:  Altered mental status. EXAM: PORTABLE CHEST 1 VIEW COMPARISON:  12/12/2012 FINDINGS: Single view of the chest demonstrates clear lungs. There is a curvilinear density overlying the right upper chest which appears to represent overlying soft tissues. There is no evidence to suggest a pneumothorax. No focal airspace disease. Heart size is with normal limits. Atherosclerotic calcifications at the aortic arch.  No acute bone abnormality. IMPRESSION: No acute chest findings. Electronically Signed   By: Markus Daft [name]   On: 12/27/2014 22:34   I have personally reviewed and evaluated these images and lab results as part of my medical decision-making.   EKG Interpretation   Date/Time:  Thursday December 27 2014 21:19:21 EDT Ventricular Rate:  72 PR Interval:  213 QRS Duration: 91 QT Interval:  438 QTC Calculation: 479 R Axis:   74 Text Interpretation:  Sinus rhythm Borderline prolonged PR interval  Borderline prolonged QT interval Confirmed by Alvino Chapel  MD, Ovid Curd  [phone]) on 12/27/2014 10:58:40 PM      MDM   Final diagnoses:  Aphasia  Acute encephalopathy   patient with altered mental status. Also right-sided weakness. Aphasia. Not a TPA candidate due to variability of the symptoms and time of onset. [name] also considered. Later did have some seizure activity/shaking of upper or lower extremities but he was still awake during this. Seen by neurology after a delay due to malfunctioning pager. Admit  to internal medicine.    Davonna Belling, MD 12/27/14 [phone]

## 2014-12-27 NOTE — Consult Note (Addendum)
Referring Physician: ED    Chief Complaint: code stroke, decreased responsiveness, unable to talk, right sided weakness  HPI:                                                                                                                                         Miguel Hanson is an 69 y.o. male, well known to the neurology service, with a past medical history that is pertinent for HTN, polysubstance abuse, hepatic encephalopathy, alcohol withdrawal, question of underlying Wernicke's encephalopathy/Korsakoff syndrome, PTSD, and seizure disorder with some seizures allegedly being alcohol related, brought in due to acute onset of the above stated symptoms. Patient currently unable to contribute to his clinical information, but EMS was called because " at [place] wife states pt. Went out on to porch to talk to son and around 60 pt. Started having garbled and repetitive speech. Wife and son assisted pt. To bed. When wife went to check on pt. Wife states pt. Was found sitting on the floor leaning against the bed with vomit on him". Upon arrival to the ED patient noted to be unable to move the right side. Miguel Hanson became more responsive and started regaining muscle strength in the right side while in the ED. However, he was not able to consistently engage in a conversation, answering yes or no questions. Questionable seizure activity noted by ED attending in the ED CT brain did no revealed acute abnormality. Outpatient neurology notes indicated that " he is on Keppra and Trileptal per records but he doesn' t know what he takes at all. He takes a few pills in the morning, afternoon and in the evening but doesn' t know what they are or what doses. No one helps with medications".   Date last known well:  12/27/14 Time last known well: 1830 tPA Given: no, likely seizure NIHSS: 6 MRS: 0  Past Medical History  Diagnosis Date  . Hypertension   . Seizures ([place])   . ETOH abuse   . Drug abuse   . PTSD  (post-traumatic stress disorder)   . H/O prolonged Q-T interval on ECG   . Polysubstance abuse   . Alcohol abuse   . Depression     Past Surgical History  Procedure Laterality Date  . No past surgeries      Family History  Problem Relation Age of Onset  . Hypertension Mother   . Hypertension Father    Social History:  reports that he has been smoking Cigarettes.  He has a 24.5 pack-year smoking history. He uses smokeless tobacco. He reports that he drinks about 21.6 oz of alcohol per week. He reports that he uses illicit drugs (Marijuana).  Allergies: No Known Allergies  Medications:  I have reviewed the patient's current medications.  ROS: unable to obtain due to mental status                                                                                                                                       History obtained from chart review   Physical exam:  Constitutional: pleasant male in no apparent distress. Blood pressure 156/96, pulse 79, temperature 98.3 F (36.8 C), temperature source Oral, resp. rate 19, SpO2 98 %. Eyes: no jaundice or exophthalmos.  Head: normocephalic. Neck: supple, no bruits, no JVD. Cardiac: no murmurs. Lungs: clear. Abdomen: soft, no tender, no mass. Extremities: no edema, clubbing, or cyanosis.  Skin: no rash  Neurologic Examination:                                                                                                      General: NAD Mental Status: Alert, awake, answers yes/no questions but doesn't follow commands consistently. No frank dysarthria or aphasia. Cranial Nerves: II: Discs flat bilaterally; Visual fields grossly normal, pupils equal, round, reactive to light and accommodation III,IV, VI: ptosis not present, extra-ocular motions intact bilaterally V,VII: smile symmetric, facial light touch  sensation normal bilaterally VIII: hearing normal bilaterally IX,X: uvula rises symmetrically XI: bilateral shoulder shrug XII: midline tongue extension without atrophy or fasciculations Motor: Moves all limbs symmetrically Tone and bulk:normal tone throughout; no atrophy noted Sensory: withdraws to painful stimuli Deep Tendon Reflexes:  2 all over Plantars: Right: downgoing   Left: downgoing Cerebellar: Unable to test as patient is not following commands Gait:  No tested due to mental status, multiple leads    Results for orders placed or performed during the hospital encounter of 12/27/14 (from the past 48 hour(s))  Ammonia     Status: None   Collection Time: 12/27/14  8:50 PM  Result Value Ref Range   Ammonia 26 9 - 35 umol/L  Ethanol     Status: None   Collection Time: 12/27/14  8:50 PM  Result Value Ref Range   Alcohol, Ethyl (B) <5 <5 mg/dL    Comment:        LOWEST DETECTABLE LIMIT FOR SERUM ALCOHOL IS 5 mg/dL FOR MEDICAL PURPOSES ONLY   Protime-INR     Status: None   Collection Time: 12/27/14  8:52 PM  Result Value Ref Range   Prothrombin Time 14.9 11.6 - 15.2 seconds   INR 1.15 0.00 - 1.49  APTT     Status:  None   Collection Time: 12/27/14  8:52 PM  Result Value Ref Range   aPTT 33 24 - 37 seconds  CBC     Status: Abnormal   Collection Time: 12/27/14  8:52 PM  Result Value Ref Range   WBC 7.3 4.0 - 10.5 K/uL   RBC 4.43 4.22 - 5.81 MIL/uL   Hemoglobin 15.9 13.0 - 17.0 g/dL   HCT 42.0 39.0 - 52.0 %   MCV 94.8 78.0 - 100.0 fL   MCH 35.9 (H) 26.0 - 34.0 pg   MCHC 37.9 (H) 30.0 - 36.0 g/dL    Comment: REPEATED TO VERIFY RULED OUT INTERFERING SUBSTANCES    RDW 13.0 11.5 - 15.5 %   Platelets 125 (L) 150 - 400 K/uL  Differential     Status: None   Collection Time: 12/27/14  8:52 PM  Result Value Ref Range   Neutrophils Relative % 47 %   Neutro Abs 3.4 1.7 - 7.7 K/uL   Lymphocytes Relative 40 %   Lymphs Abs 2.9 0.7 - 4.0 K/uL   Monocytes Relative 7 %    Monocytes Absolute 0.5 0.1 - 1.0 K/uL   Eosinophils Relative 6 %   Eosinophils Absolute 0.4 0.0 - 0.7 K/uL   Basophils Relative 0 %   Basophils Absolute 0.0 0.0 - 0.1 K/uL  Comprehensive metabolic panel     Status: Abnormal   Collection Time: 12/27/14  8:52 PM  Result Value Ref Range   Sodium 133 (L) 135 - 145 mmol/L   Potassium 4.0 3.5 - 5.1 mmol/L   Chloride 100 (L) 101 - 111 mmol/L   CO2 23 22 - 32 mmol/L   Glucose, Bld 112 (H) 65 - 99 mg/dL   BUN <5 (L) 6 - 20 mg/dL   Creatinine, Ser 0.75 0.61 - 1.24 mg/dL   Calcium 9.0 8.9 - 10.3 mg/dL   Total Protein 7.5 6.5 - 8.1 g/dL   Albumin 3.9 3.5 - 5.0 g/dL   AST 26 15 - 41 U/L   ALT 14 (L) 17 - 63 U/L   Alkaline Phosphatase 67 38 - 126 U/L   Total Bilirubin 0.9 0.3 - 1.2 mg/dL   GFR calc non Af Amer >60 >60 mL/min   GFR calc Af Amer >60 >60 mL/min    Comment: (NOTE) The eGFR has been calculated using the CKD EPI equation. This calculation has not been validated in all clinical situations. eGFR's persistently <60 mL/min signify possible Chronic Kidney Disease.    Anion gap 10 5 - 15  I-stat troponin, ED (not at [facility], [facility])     Status: None   Collection Time: 12/27/14  9:01 PM  Result Value Ref Range   Troponin i, poc 0.01 0.00 - 0.08 ng/mL   Comment 3            Comment: Due to the release kinetics of cTnI, a negative result within the first hours of the onset of symptoms does not rule out myocardial infarction with certainty. If myocardial infarction is still suspected, repeat the test at appropriate intervals.   I-Stat Chem 8, ED  (not at [facility], [facility])     Status: Abnormal   Collection Time: 12/27/14  9:08 PM  Result Value Ref Range   Sodium 138 135 - 145 mmol/L   Potassium 3.9 3.5 - 5.1 mmol/L   Chloride 101 101 - 111 mmol/L   BUN 5 (L) 6 - 20 mg/dL   Creatinine, Ser 0.80 0.61 - 1.24 mg/dL  Glucose, Bld 108 (H) 65 - 99 mg/dL   Calcium, Ion 1.14 1.13 - 1.30 mmol/L   TCO2 24 0 - 100 mmol/L   Hemoglobin 17.3 (H) 13.0  - 17.0 g/dL   HCT 51.0 39.0 - 52.0 %  CBG monitoring, ED     Status: Abnormal   Collection Time: 12/27/14  9:26 PM  Result Value Ref Range   Glucose-Capillary 119 (H) 65 - 99 mg/dL   Ct Head Wo Contrast  12/27/2014  CLINICAL DATA:  Code stroke. Garbled speech, acute onset. Initial encounter. EXAM: CT HEAD WITHOUT CONTRAST TECHNIQUE: Contiguous axial images were obtained from the base of the skull through the vertex without intravenous contrast. COMPARISON:  MRI of the brain and CT of the head performed 07/04/2014 FINDINGS: There is no evidence of acute infarction, mass lesion, or intra- or extra-axial hemorrhage on CT. Prominence of the sulci suggests mild cortical volume loss. Scattered periventricular and subcortical white matter change likely reflects small vessel ischemic microangiopathy. Mild cerebellar atrophy is noted. The brainstem and fourth ventricle are within normal limits. The third and lateral ventricles, and basal ganglia are unremarkable in appearance. The cerebral hemispheres demonstrate grossly normal gray-white differentiation. No mass effect or midline shift is seen. There is no evidence of fracture; visualized osseous structures are unremarkable in appearance. The orbits are within normal limits. There is opacification of the left frontal sinus. The remaining paranasal sinuses and mastoid air cells are well-aerated. No significant soft tissue abnormalities are seen. IMPRESSION: 1. No acute intracranial pathology seen on CT. 2. Mild cortical volume loss and scattered small vessel ischemic microangiopathy. 3. Opacification of the left frontal sinus. These results were called by telephone at the time of interpretation on 12/27/2014 at 9:21 pm to Dr. Davonna Belling, who verbally acknowledged these results. Electronically Signed   By: Garald Balding [name]   On: 12/27/2014 21:23    Assessment: 69 y.o. male well known to the neurology service, brought in by EMS as a code stroke due to acute  onset of altered mental status, inability to talk, right sided weakness. Questionable brief seizure like activity noted in the ED . NIHSS 6 but mainly due to mental status impairment (In fact, I know patient well and current mental status is not dissimilar to previous admissions). CT brain without acute abnormality. IV tpa or endovascular intervention were not considered due to likelihood of seizure and rapid improvement in the ED. Seizure with [name] seems to be a plausible explanation for patient presentation but can not entirely disregard the possibility of a left brain stroke. I must stressed the fact that I did not receive a code stroke page in this case. Admit to medicine. EEG in am. MRI brain with further stroke work up if MRI +. Increased keppra 1,500 mg BID and continue trileptal 600 mg BID. Will follow up.  Stroke Risk Factors - age,  HTN, polysubstance abuse    Dorian Pod ,MD [facility] [phone]  12/27/2014, 10:26 PM   Addendum: MRI brain reveals a 7 mm focus of acute infarct left cerebellum (unsure if this tiny infarct explains patient presentation). Dorian Pod, MD

## 2014-12-27 NOTE — ED Notes (Signed)
At [place] wife states pt. Went out on to porch to talk to son and around 50 pt. Started having garbled and repetitive speech. Wife and son assisted pt. To bed. When wife went to check on pt. Wife states pt. Was found sitting on the floor leaning against the bed with vomit on him. EMS was called and code stroke activiated

## 2014-12-27 NOTE — H&P (Signed)
History and Physical  Patient Name: Miguel Hanson     ZDG:[id number]    DOB: 27-Dec-1945    DOA: 12/27/2014 Referring physician: Davonna Belling, MD PCP: [facility]      Chief Complaint: Altered mental status and right sided weakness  HPI: Miguel Hanson is a 69 y.o. male with a past medical history significant for seizure disorder, history of polysubstance abuse, and recurrent hospitalizations for altered mental status similar to the current one who presents with an episode of confusion and right sided weakness.  History is collected from EDP as the patient is unable to provide history and attempts to reach family were unsuccessful.    Per report, the patient was last his normal self around 6:30 PM tonight when he went out on the porch to sit with his son. At some point while he was sitting outside the patient started to have slurred, confused, and repetitive speech. Family helped him to bed where he was leaning, weak on the right side, and had vomited.  In the ED, the patient was hypertensive and his laboratory studies were normal. A CT of the head without contrast was normal. He appeared to be weak on the right side and also suffered a witnessed generalized tonic-clonic seizure less than 1 minute.     Review of Systems:  Patient seen 10:51 PM on 12/27/2014. Unable to obtain due to patient's mental status.   No Known Allergies  Prior to Admission medications   Medication Sig Start Date End Date Taking? Authorizing Provider  feeding supplement, ENSURE COMPLETE, (ENSURE COMPLETE) LIQD Take 237 mLs by mouth 2 (two) times daily between meals. Patient taking differently: Take 237 mLs by mouth 2 (two) times daily between meals. Drinks occasionally 05/21/14   Florencia Reasons, MD  folic acid (FOLVITE) 1 MG tablet Take 1 tablet (1 mg total) by mouth daily. 05/21/14   Florencia Reasons, MD  levETIRAcetam (KEPPRA) 500 MG tablet Take 2 tablets (1,000 mg total) by mouth 2 (two) times daily. 07/11/14    Rushil Sherrye Payor, MD  magnesium oxide (MAG-OX) 400 (241.3 MG) MG tablet Take 1 tablet (400 mg total) by mouth daily. 05/21/14   Florencia Reasons, MD  metoprolol tartrate (LOPRESSOR) 25 MG tablet Take 0.5 tablets (12.5 mg total) by mouth 2 (two) times daily. 05/21/14   Florencia Reasons, MD  Multiple Vitamin (MULTIVITAMIN WITH MINERALS) TABS tablet Take 1 tablet by mouth daily. 07/11/14   Rushil Sherrye Payor, MD  Oxcarbazepine (TRILEPTAL) 600 MG tablet Take 1 tablet (600 mg total) by mouth 2 (two) times daily. 07/11/14   Rushil Sherrye Payor, MD  traZODone (DESYREL) 50 MG tablet Take 1 tablet (50 mg total) by mouth at bedtime as needed for sleep. 07/11/14   Rushil Sherrye Payor, MD   The  Following past medical, surgical, family and social history from the patient's chart were not possible to verify due to patient mentation. Past Medical History  Diagnosis Date  . Hypertension   . Seizures ([place])   . ETOH abuse   . Drug abuse   . PTSD (post-traumatic stress disorder)   . H/O prolonged Q-T interval on ECG   . Polysubstance abuse   . Alcohol abuse   . Depression     Past Surgical History  Procedure Laterality Date  . No past surgeries      Family history: family history includes Hypertension in his father and mother.  Social History: Patient lives with family.    reports that he has been  smoking Cigarettes.  He has a 24.5 pack-year smoking history. He uses smokeless tobacco. He reports that he drinks about 21.6 oz of alcohol per week. He reports that he uses illicit drugs (Marijuana).     Physical Exam: BP 168/97 mmHg  Pulse 78  Temp(Src) 98.3 F (36.8 C) (Oral)  Resp 15  SpO2 96% General appearance: Thin unkempt adult male, confused and lying in bed, quietly.   Eyes: Anicteric, conjunctiva pink, lids and lashes normal.     ENT: No nasal deformity, discharge, or epistaxis.  OP moist without lesions.   Skin: Warm and dry.  Cardiac: RRR, nl S1-S2, no murmurs appreciated.  No carotid bruits. No LE edema.  Radial pulses 2+  and symmetric. Respiratory: Normal respiratory rate and rhythm.  CTAB without rales or wheezes. Abdomen: Abdomen soft without rigidity.  Voluntary guarding.  No TTP. No ascites, distension.   MSK: No deformities or effusions. Neuro: Awake and vocalizes in response to questions, but confused and non-sequitur.  When asked, "where do you live", he replies, "Nothin ain't wrong" and so on.  Pupils are 4 mm and reactive to 3 mm. Nystagmus present.  Head tremor present. Cranial nerve and motor testing limited by patient inability to cooperate. Follows some simple commands.  The right arm and leg appear to have trouble lifting against gravity.  During this interview, the patient became unresponsive and had generalized tonic clonic movements, lasting less than 1 minute and resolving spontaneously. Psych: Unable to obtain due to patietn mentation.    Labs on Admission:  The metabolic panel shows mild hyponatremia, normal potassium, bicarbonate, and renal function. The transaminases and bilirubin are normal. The complete blood count shows mild thrombocytopenia. The ethanol and ammonia levels are normal. The troponin is normal. The INR is normal. The UDS is pending.   Radiological Exams on Admission: CT head without contrast 12/27/2014  normal  Personally reviewed: Dg Chest Portable 1 View 12/27/2014   No opacities or pneumothorax.   EKG: Independently reviewed. NSR with 1st deg AVB    Assessment/Plan  1. Acute Stroke:  This is suspected.   -Admit to telemetry -Neuro checks, NIHSS per protocol -Daily aspirin 325 mg -Start atorvastatin 40 mg daily which the patient should be on -Permissive hypertension for now -Lipids, hemoglobin A1c -MRI/MRA ordered -PT/OT/SLP consultation -EEG -Consult to Neurology, appreciate recommendations  2. Seizures:  -Increase levetiracetam to 1500 mg BID -Check levetiracetam level on admission -Continue Trileptal -Obtain records from [place]  hospital  3. HTN:  -Permissive hypertension for now -Continue low dose metoprolol from home       DVT PPx: Lovenox Diet: Heart healthy after swallow evaluation Consultants: Neurology Code Status: Full Family Communication: attempts to reach the patient's son were unsuccessful   Disposition Plan:  Will admit for stroke.  Stroke work up as above and consult to ancillary services.  Expect discharge within 3-5 days.  At the time of admission, it appears that the appropriate admission status for this patient is INPATIENT. This is judged to be reasonable and necessary in order to provide the required intensity of service to ensure the patient's safety given the presenting symptoms, physical exam findings, and initial radiographic and laboratory data in the context of their chronic comorbidities.  Together, these circumstances are felt to place her/him at high risk for further clinical deterioration threatening life, limb, or organ. The following factors support the admission status of inpatient:   A. The patient's presenting symptoms include right-sided weakness, seizure B. The worrisome physical  exam findings include confusion  C. The initial radiographic and laboratory data are worrisome because of MRI showing cerebellar stroke  D. The chronic co-morbidities include seizure disorder, vascular disease.  E. Patient requires inpatient status due to high intensity of service, high risk for further deterioration and high frequency of surveillance required. F. I certify that at the point of admission it is my clinical judgment that the patient will require inpatient hospital care spanning beyond 2 midnights from the point of admission.     Edwin Dada [facility] Pager [phone]

## 2014-12-27 NOTE — ED Notes (Signed)
Pt CBG is 119. Nurse notified.

## 2014-12-28 ENCOUNTER — Inpatient Hospital Stay (HOSPITAL_COMMUNITY): Payer: Medicare Other

## 2014-12-28 ENCOUNTER — Inpatient Hospital Stay (HOSPITAL_COMMUNITY)
Admit: 2014-12-28 | Discharge: 2014-12-28 | Disposition: A | Discharge status: Home / Self Care | Payer: Medicare Other | Attending: Neurology | Admitting: Neurology

## 2014-12-28 ENCOUNTER — Encounter (HOSPITAL_COMMUNITY): Payer: Self-pay | Admitting: Radiology

## 2014-12-28 DIAGNOSIS — R4701 Aphasia: Secondary | ICD-10-CM

## 2014-12-28 DIAGNOSIS — G9340 Encephalopathy, unspecified: Secondary | ICD-10-CM

## 2014-12-28 DIAGNOSIS — G40909 Epilepsy, unspecified, not intractable, without status epilepticus: Secondary | ICD-10-CM

## 2014-12-28 DIAGNOSIS — E43 Unspecified severe protein-calorie malnutrition: Secondary | ICD-10-CM

## 2014-12-28 DIAGNOSIS — I6789 Other cerebrovascular disease: Secondary | ICD-10-CM

## 2014-12-28 DIAGNOSIS — R4182 Altered mental status, unspecified: Secondary | ICD-10-CM

## 2014-12-28 DIAGNOSIS — I6329 Cerebral infarction due to unspecified occlusion or stenosis of other precerebral arteries: Secondary | ICD-10-CM | POA: Diagnosis present

## 2014-12-28 DIAGNOSIS — E440 Moderate protein-calorie malnutrition: Secondary | ICD-10-CM

## 2014-12-28 DIAGNOSIS — I10 Essential (primary) hypertension: Secondary | ICD-10-CM

## 2014-12-28 LAB — LIPID PANEL
CHOLESTEROL: 174 mg/dL (ref 0–200)
HDL: 79 mg/dL (ref >40)
LDL Cholesterol: 84 mg/dL (ref 0–99)
TRIGLYCERIDES: 57 mg/dL (ref <150)
Total CHOL/HDL Ratio: 2.2 RATIO
VLDL: 11 mg/dL (ref 0–40)

## 2014-12-28 LAB — GLUCOSE, CAPILLARY
GLUCOSE-CAPILLARY: 121 mg/dL — AB (ref 65–99)
GLUCOSE-CAPILLARY: 110 mg/dL — AB (ref 65–99)
Glucose-Capillary: 129 mg/dL — ABNORMAL HIGH (ref 65–99)
Glucose-Capillary: 141 mg/dL — ABNORMAL HIGH (ref 65–99)

## 2014-12-28 MED ORDER — CEFTRIAXONE SODIUM 1 G IJ SOLR
1.0000 g | INTRAMUSCULAR | Status: DC
  Administered 2014-12-28 – 2014-12-31 (×4): 1 g via INTRAVENOUS
  Filled 2014-12-28 (×5): qty 10

## 2014-12-28 MED ORDER — SODIUM CHLORIDE 0.9 % IV SOLN
1500.0000 mg | Freq: Two times a day (BID) | INTRAVENOUS | Status: DC
  Administered 2014-12-28 – 2014-12-31 (×8): 1500 mg via INTRAVENOUS
  Filled 2014-12-28 (×9): qty 15

## 2014-12-28 MED ORDER — IOHEXOL 350 MG/ML SOLN
100.0000 mL | Freq: Once | INTRAVENOUS | Status: AC | PRN
  Administered 2014-12-28: 100 mL via INTRAVENOUS

## 2014-12-28 MED ORDER — ONDANSETRON HCL 4 MG/2ML IJ SOLN
4.0000 mg | Freq: Four times a day (QID) | INTRAMUSCULAR | Status: DC | PRN
  Administered 2014-12-28: 4 mg via INTRAVENOUS
  Filled 2014-12-28: qty 2

## 2014-12-28 MED ORDER — VITAMIN B-1 100 MG PO TABS
100.0000 mg | ORAL_TABLET | Freq: Every day | ORAL | Status: DC
  Administered 2014-12-28 – 2014-12-31 (×4): 100 mg via ORAL
  Filled 2014-12-28 (×4): qty 1

## 2014-12-28 NOTE — Evaluation (Signed)
Physical Therapy Evaluation and Discharge Patient Details Name: Miguel Hanson MRN: [id number] DOB: June 13, 1945 Today's Date: 12/28/2014   History of Present Illness  Adm after family noted decreased responsiveness, unable to talk, right sided weakness. +seizure in ED; MRI + 43mm acute cerebellar infarct (incidental finding as does not explain his symptoms). PMHx-  HTN, polysubstance abuse, hepatic encephalopathy, alcohol withdrawal, question of underlying Wernicke's encephalopathy/Korsakoff syndrome, PTSD, and seizure disorder     Clinical Impression  Patient evaluated by Physical Therapy with no further acute PT needs identified. No family present and pt with receptive and expressive difficulties. He did follow visual with tactile cues. Ambulated with no assist or device with no loss of balance. PT is signing off. Thank you for this referral.     Follow Up Recommendations No PT follow up    Equipment Recommendations  None recommended by PT    Recommendations for Other Services       Precautions / Restrictions        Mobility  Bed Mobility Overal bed mobility: Independent                Transfers Overall transfer level: Independent Equipment used: None                Ambulation/Gait Ambulation/Gait assistance: Supervision Ambulation Distance (Feet): 15 Feet (x2) Assistive device: None Gait Pattern/deviations: WFL(Within Functional Limits)     General Gait Details: upon standing, pt indicated need to use bathroom. He walked unassisted (except for IV pole) to/from bathroom with steady gait including turns  Financial trader Rankin (Stroke Patients Only) Modified Rankin (Stroke Patients Only) Pre-Morbid Rankin Score:  (no family; expressive difficulties) Modified Rankin: Moderate disability     Balance Overall balance assessment: No apparent balance deficits (not formally assessed)                                            Pertinent Vitals/Pain Pain Assessment: Faces Faces Pain Scale: No hurt    Home Living Family/patient expects to be discharged to:: Private residence Living Arrangements: Spouse/significant other   Type of Home: House Home Access: Ramped entrance     Home Layout: One level Home Equipment: Walker - 2 wheels Additional Comments: Pt unable to provide info.  Per chart review     Prior Function           Comments: unknown     Hand Dominance        Extremity/Trunk Assessment   Upper Extremity Assessment: Overall WFL for tasks assessed (follows visual cues)           Lower Extremity Assessment: Overall WFL for tasks assessed (follows visual cues)      Cervical / Trunk Assessment: Normal  Communication   Communication: Receptive difficulties;Expressive difficulties  Cognition Arousal/Alertness: Awake/alert Behavior During Therapy: WFL for tasks assessed/performed Overall Cognitive Status: Difficult to assess                      General Comments General comments (skin integrity, edema, etc.): At times could only answer "yes" or "the one" Phone rang and he answered with "hello" "not yet" and other simple phrases     Exercises        Assessment/Plan    PT Assessment Patent does not need  any further PT services  PT Diagnosis Altered mental status   PT Problem List    PT Treatment Interventions     PT Goals (Current goals can be found in the Care Plan section) Acute Rehab PT Goals Patient Stated Goal: unable    Frequency     Barriers to discharge        Co-evaluation               End of Session Equipment Utilized During Treatment: Gait belt Activity Tolerance: Patient tolerated treatment well Patient left: in bed;with call bell/phone within reach;with bed alarm set Nurse Communication: Mobility status         Time: 1655-3748 PT Time Calculation (min) (ACUTE ONLY): 17 min   Charges:   PT  Evaluation $Initial PT Evaluation Tier I: 1 Procedure     PT G Codes:        Takina Busser 2015-01-01, 1:37 PM Pager [phone]

## 2014-12-28 NOTE — Care Management Note (Signed)
Case Management Note  Patient Details  Name: Miguel Hanson MRN: [id number] Date of Birth: [birth year]  Subjective/Objective:                    Action/Plan: Patient admitted with CVA. Patient lives at home with his spouse. Awaiting further medical work up and PT/OT recommendations. CM will continue to follow for discharge needs.   Expected Discharge Date:                  Expected Discharge Plan:     In-House Referral:     Discharge planning Services     Post Acute Care Choice:    Choice offered to:     DME Arranged:    DME Agency:     HH Arranged:    [place] Agency:     Status of Service:     Medicare Important Message Given:    Date Medicare IM Given:    Medicare IM give by:    Date Additional Medicare IM Given:    Additional Medicare Important Message give by:     If discussed at [place] of Stay Meetings, dates discussed:    Additional Comments:  Pollie Friar, RN 12/28/2014, 11:36 AM

## 2014-12-28 NOTE — Progress Notes (Signed)
EEG Completed; Results Pending  

## 2014-12-28 NOTE — Evaluation (Signed)
Clinical/Bedside Swallow Evaluation Patient Details  Name: Miguel Hanson MRN: [id number] Date of Birth: May 26, 1945  Today's Date: 12/28/2014 Time: SLP Start Time (ACUTE ONLY): 1045 SLP Stop Time (ACUTE ONLY): 1100 SLP Time Calculation (min) (ACUTE ONLY): 15 min  Past Medical History:  Past Medical History  Diagnosis Date  . Hypertension   . Seizures ([place])   . ETOH abuse   . Drug abuse   . PTSD (post-traumatic stress disorder)   . H/O prolonged Q-T interval on ECG   . Polysubstance abuse   . Alcohol abuse   . Depression    Past Surgical History:  Past Surgical History  Procedure Laterality Date  . No past surgeries     HPI:  Miguel Hanson is a 69 y.o. male with a past medical history significant for seizure disorder, history of polysubstance abuse, and recurrent hospitalizations for altered mental status similar to the current one who presents with an episode of confusion and right sided weakness. MRI shows acute 23mm left cerebellar infarct. CXR negative.    Assessment / Plan / Recommendation Clinical Impression   Patient presents with a mild oropharyngeal dysphagia, with cognitive impact (impulsivity and decreased awareness and problem solving). Patient did not exhibit any overt s/s of aspiration with thin liquids, puree solids or hard solids, even when he drank large sips of water through straw. Patient did exhibit some decreased bolus awareness,decreased oral control and decreased labial seal, but did not exhibit any residuals in oral cavity post-swallows. Patient exhibited s/s of apraxia: misuse of utensils, trying to drink through spoon, dis-coordination when bringing cup/straw to mouth).    Aspiration Risk  Mild    Diet Recommendation Dysphagia 3 (Mech soft);Thin   Medication Administration: Whole meds with puree Compensations: Slow rate;Small sips/bites;Minimize environmental distractions    Other  Recommendations Oral Care Recommendations: Oral care BID   Follow Up  Recommendations       Frequency and Duration min 1 x/week  1 week   Pertinent Vitals/Pain     SLP Swallow Goals  1. Patient will tolerate Dys 3, thin liquid diet with no overt s/s aspiration.  2. Patient will participate in trials of upgrade solid textures with SLP.   Swallow Study Prior Functional Status  Type of Home: House    General Date of Onset: 12/27/14 Other Pertinent Information: Miguel Hanson is a 69 y.o. male with a past medical history significant for seizure disorder, history of polysubstance abuse, and recurrent hospitalizations for altered mental status similar to the current one who presents with an episode of confusion and right sided weakness. MRI shows acute 49mm left cerebellar infarct. CXR negative.  Type of Study: Bedside swallow evaluation Previous Swallow Assessment: N/A Diet Prior to this Study: NPO Temperature Spikes Noted: No Respiratory Status: Room air History of Recent Intubation: No Behavior/Cognition: Alert;Cooperative;Confused;Pleasant mood;Requires cueing Oral Cavity - Dentition: Edentulous (has one tooth on bottom) Self-Feeding Abilities: Needs assist Patient Positioning: Upright in bed Baseline Vocal Quality: Normal Volitional Cough: Cognitively unable to elicit Volitional Swallow: Able to elicit    Oral/Motor/Sensory Function Overall Oral Motor/Sensory Function: Appears within functional limits for tasks assessed   Ice Chips Ice chips: Not tested   Thin Liquid Thin Liquid: Impaired Presentation: Self Fed;Cup;Straw Oral Phase Impairments: Poor awareness of bolus;Reduced labial seal Other Comments: Patient did not exhibit any overt s/s of aspiration, even when taking large successive straw sips    Nectar Thick Nectar Thick Liquid: Not tested   Honey Thick Honey Thick Liquid: Not tested  Puree Puree: Within functional limits Presentation: Self Fed;Spoon   Solid   GO    Solid: Impaired Oral Phase Impairments: Reduced lingual  movement/coordination;Impaired mastication (mastication likely impacted by lack of dentures) Pharyngeal Phase Impairments: Suspected delayed [place], MA, CCC-SLP 12/28/2014 3:24 PM

## 2014-12-28 NOTE — Progress Notes (Signed)
Utilization Review Completed.Miguel Hanson T10/28/2016  

## 2014-12-28 NOTE — Progress Notes (Signed)
  Echocardiogram 2D Echocardiogram has been performed.  Miguel Hanson 12/28/2014, 3:46 PM

## 2014-12-28 NOTE — Progress Notes (Signed)
PROGRESS NOTE  Miguel Hanson YQM:[id number] DOB: 10-31-1945 DOA: 12/27/2014 PCP: [facility]  Assessment/Plan: Acute Stroke- MRI + for celebellar- does not appear responsible for his symptoms telemetry -Neuro checks, NIHSS per protocol -Daily aspirin 325 mg -Start atorvastatin 40 mg daily which the patient should be on -Permissive hypertension for now -LDL: 84, hemoglobin A1c -PT/OT/SLP consultation- DYS diet -EEG -Consult to Neurology, appreciate recommendations  Seizures:  -Increase levetiracetam to 1500 mg BID -Check levetiracetam level on admission -Continue Trileptal -EEG  HTN:  -Permissive hypertension for now -Continue low dose metoprolol from home  Fever -urine culture- nitrate + -start IV abx X ray: no acute chest findings  Code Status: full Family Communication: patient Disposition Plan:    Consultants:  neuro  Procedures:      HPI/Subjective: Says few words Says he has a cough  Objective: Filed Vitals:   12/28/14 0600  BP: 137/86  Pulse: 94  Temp: 99.2 F (37.3 C)  Resp: 16    Intake/Output Summary (Last 24 hours) at 12/28/14 1302 Last data filed at 12/28/14 0215  Gross per 24 hour  Intake      0 ml  Output    300 ml  Net   -300 ml   Filed Weights   12/28/14 0004 12/28/14 1022  Weight: 52.844 kg (116 lb 8 oz) 52.844 kg (116 lb 8 oz)    Exam:   General:  awake  Cardiovascular: rrr  Respiratory: clear  Abdomen: +BS, soft  Musculoskeletal: moves all 4 ext   Data Reviewed: Basic Metabolic Panel:  Recent Labs Lab 12/27/14 2052 12/27/14 2108  NA 133* 138  K 4.0 3.9  CL 100* 101  CO2 23  --   GLUCOSE 112* 108*  BUN <5* 5*  CREATININE 0.75 0.80  CALCIUM 9.0  --    Liver Function Tests:  Recent Labs Lab 12/27/14 2052  AST 26  ALT 14*  ALKPHOS 67  BILITOT 0.9  PROT 7.5  ALBUMIN 3.9   No results for input(s): LIPASE, AMYLASE in the last 168 hours.  Recent Labs Lab 12/27/14 2050  AMMONIA  26   CBC:  Recent Labs Lab 12/27/14 2052 12/27/14 2108  WBC 7.3  --   NEUTROABS 3.4  --   HGB 15.9 17.3*  HCT 42.0 51.0  MCV 94.8  --   PLT 125*  --    Cardiac Enzymes: No results for input(s): CKTOTAL, CKMB, CKMBINDEX, TROPONINI in the last 168 hours. BNP (last 3 results) No results for input(s): BNP in the last 8760 hours.  ProBNP (last 3 results) No results for input(s): PROBNP in the last 8760 hours.  CBG:  Recent Labs Lab 12/27/14 2126 12/28/14 0635 12/28/14 1115  GLUCAP 119* 129* 141*    No results found for this or any previous visit (from the past 240 hour(s)).   Studies: Ct Head Wo Contrast  12/27/2014  CLINICAL DATA:  Code stroke. Garbled speech, acute onset. Initial encounter. EXAM: CT HEAD WITHOUT CONTRAST TECHNIQUE: Contiguous axial images were obtained from the base of the skull through the vertex without intravenous contrast. COMPARISON:  MRI of the brain and CT of the head performed 07/04/2014 FINDINGS: There is no evidence of acute infarction, mass lesion, or intra- or extra-axial hemorrhage on CT. Prominence of the sulci suggests mild cortical volume loss. Scattered periventricular and subcortical white matter change likely reflects small vessel ischemic microangiopathy. Mild cerebellar atrophy is noted. The brainstem and fourth ventricle are within normal limits. The third and  lateral ventricles, and basal ganglia are unremarkable in appearance. The cerebral hemispheres demonstrate grossly normal gray-white differentiation. No mass effect or midline shift is seen. There is no evidence of fracture; visualized osseous structures are unremarkable in appearance. The orbits are within normal limits. There is opacification of the left frontal sinus. The remaining paranasal sinuses and mastoid air cells are well-aerated. No significant soft tissue abnormalities are seen. IMPRESSION: 1. No acute intracranial pathology seen on CT. 2. Mild cortical volume loss and  scattered small vessel ischemic microangiopathy. 3. Opacification of the left frontal sinus. These results were called by telephone at the time of interpretation on 12/27/2014 at 9:21 pm to Dr. Davonna Belling, who verbally acknowledged these results. Electronically Signed   By: Garald Balding [name]   On: 12/27/2014 21:23   [name] Contrast  12/28/2014  CLINICAL DATA:  Acute onset garbled speech, unable to move RIGHT side. Vomiting. History of polysubstance abuse, hepatic encephalopathy, Wernicke encephalopathy, seizure disorder, hypertension. EXAM: MRI HEAD WITHOUT CONTRAST TECHNIQUE: Multiplanar, multiecho pulse sequences of the brain and surrounding structures were obtained without intravenous contrast. COMPARISON:  CT head December 27, 2014 an MRI of the brain May 08, 2014 FINDINGS: 7 mm focus of reduced diffusion LEFT cerebellum with corresponding low ADC value. No susceptibility artifact to suggest hemorrhage. Probable LEFT posterior temporal developmental venous anomaly, which was present previously. Moderate global parenchymal brain volume loss, stable from prior imaging. Patchy supratentorial and pontine white matter T2 hyperintensities compatible with chronic small vessel ischemic disease. No abnormal extra-axial fluid collections. No extra-axial masses though, contrast enhanced sequences would be more sensitive. Normal major intracranial vascular flow voids seen at the skull base. Ocular globes and orbital contents are unremarkable though not tailored for evaluation. No abnormal sellar expansion. No suspicious calvarial bone marrow signal. Craniocervical junction maintained. LEFT frontal sinusitis, mild paranasal sinus mucosal thickening without air-fluid levels. Trace LEFT mastoid effusion. IMPRESSION: Acute 7 mm LEFT cerebellar infarct. Moderate global parenchymal brain volume loss. Moderate white matter changes compatible with chronic small vessel ischemic disease. Electronically Signed   By:  Elon Alas [name]   On: 12/28/2014 01:48   Dg Chest Portable 1 View  12/27/2014  CLINICAL DATA:  Altered mental status. EXAM: PORTABLE CHEST 1 VIEW COMPARISON:  12/12/2012 FINDINGS: Single view of the chest demonstrates clear lungs. There is a curvilinear density overlying the right upper chest which appears to represent overlying soft tissues. There is no evidence to suggest a pneumothorax. No focal airspace disease. Heart size is with normal limits. Atherosclerotic calcifications at the aortic arch. No acute bone abnormality. IMPRESSION: No acute chest findings. Electronically Signed   By: Markus Daft [name]   On: 12/27/2014 22:34    Scheduled Meds: . aspirin  300 mg Rectal Daily   Or  . aspirin  325 mg Oral Daily  . atorvastatin  40 mg Oral q1800  . enoxaparin (LOVENOX) injection  40 mg Subcutaneous Q24H  . folic acid  1 mg Oral Daily  . levETIRAcetam  1,500 mg Intravenous BID  . metoprolol tartrate  12.5 mg Oral BID  . multivitamin with minerals  1 tablet Oral Daily  . Oxcarbazepine  600 mg Oral BID  . [START ON 12/29/2014] thiamine  100 mg Oral Daily   Continuous Infusions:  Antibiotics Given (last 72 hours)    None      Principal Problem:   Acute arterial ischemic stroke, vertebrobasilar, cerebellar (HCC) Active Problems:   Seizure disorder (HCC)   COPD (  chronic obstructive pulmonary disease) (HCC)   Altered mental status   HTN (hypertension), malignant   Protein-calorie malnutrition, severe ([place])   Malnutrition of moderate degree    Time spent: 25 min    Denali Becvar  [facility] Pager [phone]. If 7PM-7AM, please contact night-coverage at [email or web address], password [facility] 12/28/2014, 1:02 PM  LOS: 1 day

## 2014-12-28 NOTE — Progress Notes (Addendum)
STROKE TEAM PROGRESS NOTE   SUBJECTIVE (INTERVAL HISTORY) No family is at the bedside.  PT in room. He still confused and with significant preservation and not following commands, consistent with aphasia. However, no arm or leg weakness. Able to pantomime.     OBJECTIVE Temp:  [98.3 F (36.8 C)-101.7 F (38.7 C)] 98.3 F (36.8 C) (10/28 1315) Pulse Rate:  [72-97] 77 (10/28 1315) Cardiac Rhythm:  [-] Heart block (10/28 0747) Resp:  [11-19] 16 (10/28 1315) BP: (123-183)/(73-100) 123/73 mmHg (10/28 1315) SpO2:  [92 %-100 %] 97 % (10/28 1315) Weight:  [116 lb 8 oz (52.844 kg)] 116 lb 8 oz (52.844 kg) (10/28 1022)   Recent Labs Lab 12/27/14 2126 12/28/14 0635 12/28/14 1115  GLUCAP 119* 129* 141*    Recent Labs Lab 12/27/14 2052 12/27/14 2108  NA 133* 138  K 4.0 3.9  CL 100* 101  CO2 23  --   GLUCOSE 112* 108*  BUN <5* 5*  CREATININE 0.75 0.80  CALCIUM 9.0  --     Recent Labs Lab 12/27/14 2052  AST 26  ALT 14*  ALKPHOS 67  BILITOT 0.9  PROT 7.5  ALBUMIN 3.9    Recent Labs Lab 12/27/14 2052 12/27/14 2108  WBC 7.3  --   NEUTROABS 3.4  --   HGB 15.9 17.3*  HCT 42.0 51.0  MCV 94.8  --   PLT 125*  --    No results for input(s): CKTOTAL, CKMB, CKMBINDEX, TROPONINI in the last 168 hours.  Recent Labs  12/27/14 2052  LABPROT 14.9  INR 1.15    Recent Labs  12/27/14 2322  COLORURINE YELLOW  LABSPEC 1.011  PHURINE 7.0  GLUCOSEU NEGATIVE  HGBUR NEGATIVE  BILIRUBINUR NEGATIVE  KETONESUR NEGATIVE  PROTEINUR 30*  UROBILINOGEN 1.0  NITRITE POSITIVE*  LEUKOCYTESUR NEGATIVE       Component Value Date/Time   CHOL 174 12/28/2014 0233   CHOL 216* 08/07/2014 1141   TRIG 57 12/28/2014 0233   HDL 79 12/28/2014 0233   HDL 74 08/07/2014 1141   CHOLHDL 2.2 12/28/2014 0233   CHOLHDL 2.9 08/07/2014 1141   VLDL 11 12/28/2014 0233   LDLCALC 84 12/28/2014 0233   LDLCALC 124* 08/07/2014 1141   Lab Results  Component Value Date   HGBA1C 5.3 08/07/2014       Component Value Date/Time   LABOPIA NONE DETECTED 12/27/2014 2322   LABOPIA NEGATIVE 12/11/2012 1207   COCAINSCRNUR NONE DETECTED 12/27/2014 2322   COCAINSCRNUR NEGATIVE 12/11/2012 1207   LABBENZ NONE DETECTED 12/27/2014 2322   LABBENZ POSITIVE* 12/11/2012 1207   AMPHETMU NONE DETECTED 12/27/2014 2322   AMPHETMU NEGATIVE 12/11/2012 1207   THCU POSITIVE* 12/27/2014 2322   LABBARB NONE DETECTED 12/27/2014 2322     Recent Labs Lab 12/27/14 2050  ETH <5    I have personally reviewed the radiological images below and agree with the radiology interpretations.  Ct Angio neck and Head W/cm &/or Wo Cm  12/28/2014  IMPRESSION: 1. The left vertebral artery is the dominant vessel. No PICA vessel is stain. A dominant left AICA AA is evident without focal stenosis. 2. The non dominant right vertebral artery essentially terminates at the PICA. 3. Atherosclerotic changes at the carotid bifurcations bilaterally without significant stenosis. 4. 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   Ct Head Wo Contrast  12/27/2014  IMPRESSION: 1. No acute intracranial pathology seen on CT.  2. Mild cortical volume loss and scattered small vessel ischemic microangiopathy. 3. Opacification of the left frontal sinus.  [name] Contrast  12/28/2014  IMPRESSION: Acute 7 mm LEFT cerebellar infarct. Moderate global parenchymal brain volume loss. Moderate white matter changes compatible with chronic small vessel ischemic disease.   Dg Chest Portable 1 View  12/27/2014  IMPRESSION: No acute chest findings.    2D Echocardiogram  - Left ventricle: The cavity size was normal. Systolic function was normal. The estimated ejection fraction was in the range of 60% to 65%. Wall motion was normal; there were no regional wall motion abnormalities. There was an increased  relative contribution of atrial contraction to ventricular filling. Doppler parameters are consistent with abnormal left ventricular relaxation (grade 1 diastolic dysfunction). - Aortic valve: There was trivial regurgitation. - Mitral valve: Calcified annulus. There was trivial regurgitation.  EEG Impression: This awake and drowsy EEG is abnormal due to the presence of: 1. Diffuse slowing of the waking background 2. Additional focal slowing over the left hemisphere Clinical Correlation of the above findings indicates bilateral cerebral dysfunction that is non-specific in etiology and can be seen with hypoxic/ischemic injury, toxic/metabolic encephalopathies, or medication effect. Additional focal slowing over the left hemisphere indicates focal cerebral dysfunction in this region suggestive of underlying structural or physiologic abnormality. The absence of epileptiform discharges does not rule out a clinical diagnosis of epilepsy. Clinical correlation is advised.  PHYSICAL EXAM  Temp:  [98.3 F (36.8 C)-101.7 F (38.7 C)] 98.3 F (36.8 C) (10/28 1315) Pulse Rate:  [72-97] 77 (10/28 1315) Resp:  [11-19] 16 (10/28 1315) BP: (123-183)/(73-100) 123/73 mmHg (10/28 1315) SpO2:  [92 %-100 %] 97 % (10/28 1315) Weight:  [116 lb 8 oz (52.844 kg)] 116 lb 8 oz (52.844 kg) (10/28 1022)  General - thin built, well developed, in no apparent distress.  Ophthalmologic - fundi not visualized due to not following commands.  Cardiovascular - Regular rate and rhythm with no murmur.  Mental Status -  Awake, alert, but not following commands. Able to pantomime. Answer "101" for all questions, not able to name or repeat. Significant perseveration.   Cranial Nerves II - XII - II - blinking to visual threat bilaterally but not consistent. III, IV, VI - intact lateral gaze bilaterally. V - not cooperative. VII - Facial movement intact bilaterally. VIII - Hearing & vestibular intact  bilaterally. X - not cooperative. XI - shoulder shrug intact bilaterally. XII - Tongue protrusion intact.  Motor Strength - The patient's strength was symmetric in all extremities and pronator drift was absent.  Bulk was normal and fasciculations were absent.   Motor Tone - Muscle tone was assessed at the neck and appendages and was normal.  Reflexes - The patient's reflexes were symmetrical in all extremities and he had no pathological reflexes.  Sensory - Light touch, temperature/pinprick testing not cooperative.    Coordination - The patient had normal movements in the hands with no ataxia or dysmetria.  Tremor was absent.  Gait and Station - not tested.   ASSESSMENT/PLAN [name] is a 69 y.o. male with history of seizure disorder likely due to alcohol withdraw, ? Wernicke's encephalopathy, substance abuse, hepatic encephalopathy admitted for confusion, speech difficulty and right side weakness. Questionable seizure activity noted by ED attending in the ED.    Global aphasia with perseveration - metabolic encephalopathy vs. Wernicke's encephalopathy vs. Seizure  Significant seizure history  Not compliant with seizure medication   EEG showed focal slowing  over the left hemisphere  previous EEG showed periodic sharp wave discharges in left parietal temporal region  Follows with Dr. Jaynee Eagles in [facility] for seizure  keppra and trileptal level pending  Questionable seizure activity noted by ED attending in the ED  Increased keppra to 1500mg  bid  Continue trileptal 600mg  bid  One time fever 101.20F - on rocephin  UA negative  THC positive  Normal ammonia and liver enzymes  On FA, B1 and multivitamine  Incidental left cerebellar punctate infarct - likely due to small vessel disease  MRI  Left PICA territory punctate infarct  CTA head and neck showed innominate artery high grade stenosis (but asymptomatic so far)  2D Echo  unremarkable  LDL 84  HgbA1c  pending  lovenox for VTE prophylaxis  DIET DYS 3 Room service appropriate?: Yes; Fluid consistency:: Thin   No antithrombotic prior to admission, now on aspirin 325 mg daily  Patient counseled to be compliant with his antithrombotic medications  Ongoing aggressive stroke risk factor management  Hypertension  Home meds:   metoprolol Currently on metoprolol  Stable  Patient counseled to be compliant with his blood pressure medications  Hyperlipidemia  Home meds:  none   LDL 84, goal < 70  Add lipitor 40  Continue statin at discharge  Other Stroke Risk Factors  Advanced age  ETOH use  THC abuse  Other Active Problems  hyponatremia  Other Pertinent History  Follows with Dr. Jaynee Eagles in [facility] day # 1  The patient is at risk for recurrent strokes and TIAs and neurological worsening and seizure. he needs ongoing evaluation and aggressive risk factor modification. Seizure precautions.  Rosalin Hawking, MD PhD Stroke Neurology 12/28/2014 5:15 PM    To contact Stroke Continuity provider, please refer to [email or web address] After hours, contact General Neurology

## 2014-12-28 NOTE — Procedures (Signed)
ELECTROENCEPHALOGRAM REPORT  Date of Study: 12/28/2014  Patient's Name: Miguel Hanson MRN: [id number] Date of Birth: 1945/07/26  Referring Provider: Dr. Dorian Pod  Clinical History: This is a 69 year old man with altered mental status, unable to move the right side.  Medications: levETIRAcetam (KEPPRA) 1,500 mg in sodium chloride 0.9 % 100 mL IVPB Oxcarbazepine (TRILEPTAL) tablet 600 mg acetaminophen (TYLENOL) tablet 650 mg aspirin tablet 325 mg atorvastatin (LIPITOR) tablet 40 mg folic acid (FOLVITE) tablet 1 mg metoprolol tartrate (LOPRESSOR) tablet 12.5 mg ondansetron (ZOFRAN) injection 4 mg thiamine (B-1) injection 100 mg  Technical Summary: A multichannel digital EEG recording measured by the international 10-20 system with electrodes applied with paste and impedances below 5000 ohms performed as portable with EKG monitoring in an awake and drowsy patient.  Hyperventilation and photic stimulation were not performed.  The digital EEG was referentially recorded, reformatted, and digitally filtered in a variety of bipolar and referential montages for optimal display.   Description: The patient is awake and drowsy during the recording. He is noted to be confused and restless.  During maximal wakefulness, there is an asymmetric, medium voltage 8.5-9 Hz posterior dominant rhythm better formed over the right occipital region. This is admixed with a moderate amount of diffuse 4-5 Hz theta slowing of the waking background, with additional occasional 2-3 Hz delta slowing over the left hemisphere. During drowsiness, there is an increase in theta and delta slowing of the background. Deeper stages of sleep were not seen.  Hyperventilation and photic stimulation were not performed.  There were no epileptiform discharges or electrographic seizures seen.    EKG lead was unremarkable.  Impression: This awake and drowsy EEG is abnormal due to the presence of: 1. Diffuse slowing of the waking  background 2. Additional focal slowing over the left hemisphere  Clinical Correlation of the above findings indicates bilateral cerebral dysfunction that is non-specific in etiology and can be seen with hypoxic/ischemic injury, toxic/metabolic encephalopathies, or medication effect. Additional focal slowing over the left hemisphere indicates focal cerebral dysfunction in this region suggestive of underlying structural or physiologic abnormality. The absence of epileptiform discharges does not rule out a clinical diagnosis of epilepsy.  Clinical correlation is advised.   Ellouise Newer, [name]

## 2014-12-28 NOTE — Progress Notes (Signed)
Initial Nutrition Assessment  DOCUMENTATION CODES:   Underweight, Non-severe (moderate) malnutrition in context of chronic illness  INTERVENTION:  Provide Ensure Enlive po BID, each supplement provides 350 kcal and 20 grams of protein Continue multivitamin with minerals daily Encourage PO intake   NUTRITION DIAGNOSIS:   Inadequate oral intake related to social / environmental circumstances, poor appetite as evidenced by mild depletion of body fat, moderate depletions of muscle mass.   GOAL:   Patient will meet greater than or equal to 90% of their needs   MONITOR:   PO intake, Supplement acceptance, Weight trends, Skin  REASON FOR ASSESSMENT:   Malnutrition Screening Tool    ASSESSMENT:   69 y.o. male with a past medical history significant for seizure disorder, history of polysubstance abuse, and recurrent hospitalizations for altered mental status similar to the current one who presents with an episode of confusion and right sided weakness.  Pt is a poor historian. He states that he was eating well PTA and drinking Ensure. He has lost from 124 lbs down to 116 lbs since June. Pt confirms weight loss and states it is related to poor appetite and eating 1-2 small meals daily; he has not had access to Ensure supplements recently. Pt is NPO at this time. He has moderate muscle wasting and mild fat wasting per physical exam. He is agreeable to receiving Ensure Enlive twice daily while admitted.   Labs: low sodium  Diet Order:  Diet NPO time specified  Skin:  Reviewed, no issues  Last BM:  PTA  Height:   Ht Readings from Last 1 Encounters:  12/28/14 5\' 7"  (1.702 m)    Weight:   Wt Readings from Last 1 Encounters:  12/28/14 116 lb 8 oz (52.844 kg)    Ideal Body Weight:  67.3 kg  BMI:  Body mass index is 18.24 kg/(m^2).  Estimated Nutritional Needs:   Kcal:  1600-1800  Protein:  70-80 grams  Fluid:  >/=1.6 L/day  EDUCATION NEEDS:   No education needs  identified at this time  [place], LDN Inpatient Clinical Dietitian Pager: [phone] After Hours Pager: [phone]

## 2014-12-29 LAB — GLUCOSE, CAPILLARY
GLUCOSE-CAPILLARY: 94 mg/dL (ref 65–99)
GLUCOSE-CAPILLARY: 128 mg/dL — AB (ref 65–99)
GLUCOSE-CAPILLARY: 122 mg/dL — AB (ref 65–99)
GLUCOSE-CAPILLARY: 109 mg/dL — AB (ref 65–99)

## 2014-12-29 LAB — LIPID PANEL
CHOL/HDL RATIO: 2.6 ratio
CHOLESTEROL: 160 mg/dL (ref 0–200)
HDL: 61 mg/dL (ref >40)
LDL Cholesterol: 82 mg/dL (ref 0–99)
Triglycerides: 86 mg/dL (ref <150)
VLDL: 17 mg/dL (ref 0–40)

## 2014-12-29 NOTE — Evaluation (Signed)
Speech Language Pathology Evaluation Patient Details Name: Miguel Hanson MRN: [id number] DOB: 10/21/1945 Today's Date: 12/29/2014 Time: 2376-2831 SLP Time Calculation (min) (ACUTE ONLY): 23 min  Problem List:  Patient Active Problem List   Diagnosis Date Noted  . Acute arterial ischemic stroke, vertebrobasilar, cerebellar ([place]) 12/28/2014  . Malnutrition of moderate degree 12/28/2014  . Aphasia 07/04/2014  . Acute encephalopathy 07/04/2014  . Encephalopathy acute   . Alcohol withdrawal delirium ([place])   . Confusion   . Chronic diastolic heart failure, NYHA class 1 ([place]) 05/08/2014  . Expressive aphasia 05/08/2014  . Macrocytic anemia 05/08/2014  . Prolonged Q-T interval on ECG   . Hepatic encephalopathy ([place]) 02/25/2014  . Protein-calorie malnutrition, severe ([place]) 03/03/2013  . Dilantin toxicity 03/01/2013  . HTN (hypertension), malignant 12/16/2011  . Seizure, convulsive ([place]) 08/11/2011  . Altered mental status 08/11/2011  . COPD (chronic obstructive pulmonary disease) ([place]) 03/10/2011  . Seizure disorder ([place]) 03/08/2011  . Alcohol abuse 03/08/2011  . PTSD (post-traumatic stress disorder) 03/08/2011   Past Medical History:  Past Medical History  Diagnosis Date  . Hypertension   . Seizures ([place])   . ETOH abuse   . Drug abuse   . PTSD (post-traumatic stress disorder)   . H/O prolonged Q-T interval on ECG   . Polysubstance abuse   . Alcohol abuse   . Depression    Past Surgical History:  Past Surgical History  Procedure Laterality Date  . No past surgeries     HPI:  Miguel Hanson is a 69 y.o. male with a past medical history significant for seizure disorder, history of polysubstance abuse, and recurrent hospitalizations for altered mental status similar to the current one who presents with an episode of confusion and right sided weakness. MRI shows acute 60mm left cerebellar infarct. CXR negative.    Assessment / Plan / Recommendation Clinical Impression  Pt  presents with moderate cognitive deficits. However it is difficult to assess if this is a change from baseline, no family member or caregiver present to confirm or deny acute changes. Pt displays decreased insight to deficits. Pt reports that he lives at home with his spouse who is wheelchair bound and not in good health. 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 Question effective level of care to pt and spouse. Pt requried frequent repetitions of auditory information, to effectively process information, often defaulting to "you know what I mean". Repetition, increased vocal intensity, and slowing of speech greatly improved pts comprehension abilities. Pt able to follow simple one step commands. Pt with break down of complex cognitive skills including, executive function, mental manipulation, thought processing, and recall as evidenced by [place] (3/30). Recommend ST cognitive linguistic follow up.      SLP Assessment  Patient needs continued Speech Lanaguage Pathology Services    Follow Up Recommendations       Frequency and Duration min 1 x/week  1 week   Pertinent Vitals/Pain Pain Assessment: No/denies pain   SLP Goals  Potential to Achieve Goals (ACUTE ONLY): Fair Potential Considerations (ACUTE ONLY): Previous level of function  SLP Evaluation Prior Functioning  Cognitive/Linguistic Baseline: Information not available Type of Home: House  Lives With: Spouse Available Help at Discharge: Family Education: high school    Cognition  Overall Cognitive Status: Difficult to assess Arousal/Alertness: Awake/alert Orientation Level: Oriented to person;Disoriented to time;Oriented to place;Oriented to situation Attention: Sustained Sustained Attention: Impaired Sustained Attention Impairment: Verbal  complex Memory: Impaired Memory Impairment: Decreased recall of new information;Storage deficit;Retrieval  deficit Awareness: Impaired Problem Solving: Impaired Problem Solving Impairment: Verbal complex Executive Function: Self Monitoring;Reasoning;Decision Making Reasoning: Impaired Decision Making: Impaired Self Monitoring: Impaired Behaviors: Verbal agitation Safety/Judgment: Impaired    Comprehension  Auditory Comprehension Overall Auditory Comprehension:  (required frequent repetitions of verbal information) Commands:  (simple intact) Interfering Components: Processing speed;Working memory;Visual impairments EffectiveTechniques: Extra processing time;Increased volume;Slowed speech;Repetition Reading Comprehension Reading Status: Impaired Sentence Level: Impaired    Expression Expression Primary Mode of Expression: Verbal Verbal Expression Naming: Impairment Confrontation: Impaired Written Expression Dominant Hand: Right   Oral / Motor Oral Motor/Sensory Function Overall Oral Motor/Sensory Function: Appears within functional limits for tasks assessed Motor Speech Overall Motor Speech: Appears within functional limits for tasks assessed   GO    Miguel Chaco MA, CCC-SLP Acute Care Speech Language Pathologist    Miguel Hanson 12/29/2014, 4:08 PM

## 2014-12-29 NOTE — Progress Notes (Signed)
PROGRESS NOTE  Miguel Hanson NIO:[id number] DOB: 21-Mar-1945 DOA: 12/27/2014 PCP: [place]    HPI/Subjective: Alert and awake, oriented to time and place. Has difficulty understanding questions likely secondary to aphasia  Assessment/Plan: Acute Stroke- MRI + for celebellar- does not appear responsible for his symptoms telemetry -Neuro checks, NIHSS per protocol -Daily aspirin 325 mg -Start atorvastatin 40 mg daily which the patient should be on -Permissive hypertension for now -LDL: 84, hemoglobin A1c -PT/OT/SLP consultation- DYS diet -EEG -Consult to Neurology, appreciate recommendations  Seizures:  -Increase levetiracetam to 1500 mg BID -Check levetiracetam level on admission -Continue Trileptal -EEG showed focal slowing over the left hemisphere  HTN:  -Permissive hypertension for now -Continue low dose metoprolol from home  Fever -urine culture- nitrate + -start IV abx X ray: no acute chest findings  Code Status: full Family Communication: patient Disposition Plan:    Consultants:  neuro  Procedures:  None    Objective: Filed Vitals:   12/29/14 0945  BP: 113/79  Pulse: 63  Temp: 98.6 F (37 C)  Resp: 18    Intake/Output Summary (Last 24 hours) at 12/29/14 1222 Last data filed at 12/29/14 0900  Gross per 24 hour  Intake    560 ml  Output      0 ml  Net    560 ml   Filed Weights   12/28/14 0004 12/28/14 1022  Weight: 52.844 kg (116 lb 8 oz) 52.844 kg (116 lb 8 oz)    Exam:   General:  awake  Cardiovascular: rrr  Respiratory: clear  Abdomen: +BS, soft  Musculoskeletal: moves all 4 ext   Data Reviewed: Basic Metabolic Panel:  Recent Labs Lab 12/27/14 2052 12/27/14 2108  NA 133* 138  K 4.0 3.9  CL 100* 101  CO2 23  --   GLUCOSE 112* 108*  BUN <5* 5*  CREATININE 0.75 0.80  CALCIUM 9.0  --    Liver Function Tests:  Recent Labs Lab 12/27/14 2052  AST 26  ALT 14*  ALKPHOS 67  BILITOT 0.9  PROT  7.5  ALBUMIN 3.9   No results for input(s): LIPASE, AMYLASE in the last 168 hours.  Recent Labs Lab 12/27/14 2050  AMMONIA 26   CBC:  Recent Labs Lab 12/27/14 2052 12/27/14 2108  WBC 7.3  --   NEUTROABS 3.4  --   HGB 15.9 17.3*  HCT 42.0 51.0  MCV 94.8  --   PLT 125*  --    Cardiac Enzymes: No results for input(s): CKTOTAL, CKMB, CKMBINDEX, TROPONINI in the last 168 hours. BNP (last 3 results) No results for input(s): BNP in the last 8760 hours.  ProBNP (last 3 results) No results for input(s): PROBNP in the last 8760 hours.  CBG:  Recent Labs Lab 12/28/14 1115 12/28/14 1625 12/28/14 2150 12/29/14 0642 12/29/14 1129  GLUCAP 141* 121* 110* 94 128*    No results found for this or any previous visit (from the past 240 hour(s)).   Studies: Ct Angio Head W/cm &/or Hanson Cm  12/28/2014  CLINICAL DATA:  Left cerebellar infarct. EXAM: CT ANGIOGRAPHY HEAD AND NECK TECHNIQUE: Multidetector CT imaging of the head and neck was performed using the standard protocol during bolus administration of intravenous contrast. Multiplanar CT image reconstructions and MIPs were obtained to evaluate the vascular anatomy. Carotid stenosis measurements (when applicable) are obtained utilizing NASCET criteria, using the distal internal carotid diameter as the denominator. CONTRAST:  185mL OMNIPAQUE IOHEXOL 350 MG/ML SOLN COMPARISON:  MRI  brain from the same day. FINDINGS: CT HEAD Brain: The 6 mm cerebellar infarct is poorly seen by CT. Periventricular and subcortical white matter changes are again noted bilaterally. No other acute infarct is evident. Calvarium and skull base: Calvarium is intact. Remote nasal bone fractures are noted. Paranasal sinuses: The a left frontal sinus is opacified. There is chronic sclerosis about the left sphenoid sinus. The paranasal sinuses are otherwise clear. The mastoid air cells are clear. Orbits: The globes and orbits are intact. CTA NECK Aortic arch: Dense  calcifications are present about the origin of the innominate artery this narrows the lumen to 2 mm. Right carotid system: The right common carotid artery is otherwise within normal limits. Dense calcifications are present at the carotid bifurcation without a significant stenosis relative to the more distal vessel. There is mild tortuosity of the cervical right ICA without significant stenosis. Left carotid system: The left common carotid artery is within normal limits. Atherosclerotic calcifications are present at the origin of the left internal carotid artery without a significant stenosis relative to the more distal vessel. The cervical left ICA is otherwise normal. Vertebral arteries:The vertebral arteries both originate from the subclavian arteries. The left vertebral artery is the dominant vessel. There is no significant stenosis at the origins. There is some tortuosity without significant stenosis. The vertebral arteries are otherwise within normal limits throughout the neck. The right vertebral artery terminates at the PICA. Skeleton: Degenerative facet changes are most prominent on the left at C4-5 with spurring into the foramen. There is leftward curvature of the cervical spine. No focal lytic or blastic lesions are present. Other neck: Paraseptal and centrilobular emphysematous changes are present. No focal mucosal or submucosal lesions are evident. Vocal cords are midline and symmetric. The salivary glands are unremarkable. The thyroid is within normal limits. There is no significant adenopathy. CTA HEAD Anterior circulation: The internal carotid arteries are within normal limits through the ICA termini bilaterally. The A1 and M1 segments are normal. The anterior communicating artery is patent. ACA and MCA branch vessels are within normal limits. Posterior circulation: The left vertebral artery is the dominant vessel. The left AICA is dominant. The right vertebral artery essentially terminates at the  PICA. No focal lesion is evident. A right AICA vessel is present as well. The basilar artery is within normal limits. Posterior cerebral arteries receive contribution from P1 segments and posterior communicating arteries bilaterally. The PCA branch vessels are intact. Venous sinuses: The dural sinuses are patent. The straight sinus and deep cerebral veins are intact. Cortical veins are intact. Anatomic variants: None Delayed phase: The postcontrast images demonstrate no pathologic enhancement. IMPRESSION: 1. The left vertebral artery is the dominant vessel. No PICA vessel is stain. A dominant left AICA AA is evident without focal stenosis. 2. The non dominant right vertebral artery essentially terminates at the PICA. 3. Atherosclerotic changes at the carotid bifurcations bilaterally without significant stenosis. 4. 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 Electronically Signed   By: San Morelle [name]   On: 12/28/2014 13:41   Ct Head Hanson Contrast  12/27/2014  CLINICAL DATA:  Code stroke. Garbled speech, acute onset. Initial encounter. EXAM: CT HEAD WITHOUT CONTRAST TECHNIQUE: Contiguous axial images were obtained from the base of the skull through the vertex without intravenous contrast. COMPARISON:  MRI of the brain and CT of the head performed 07/04/2014 FINDINGS: There is no evidence of  acute infarction, mass lesion, or intra- or extra-axial hemorrhage on CT. Prominence of the sulci suggests mild cortical volume loss. Scattered periventricular and subcortical white matter change likely reflects small vessel ischemic microangiopathy. Mild cerebellar atrophy is noted. The brainstem and fourth ventricle are within normal limits. The third and lateral ventricles, and basal ganglia are unremarkable in appearance. The cerebral hemispheres demonstrate grossly normal gray-white  differentiation. No mass effect or midline shift is seen. There is no evidence of fracture; visualized osseous structures are unremarkable in appearance. The orbits are within normal limits. There is opacification of the left frontal sinus. The remaining paranasal sinuses and mastoid air cells are well-aerated. No significant soft tissue abnormalities are seen. IMPRESSION: 1. No acute intracranial pathology seen on CT. 2. Mild cortical volume loss and scattered small vessel ischemic microangiopathy. 3. Opacification of the left frontal sinus. These results were called by telephone at the time of interpretation on 12/27/2014 at 9:21 pm to Dr. Davonna Belling, who verbally acknowledged these results. Electronically Signed   By: Garald Balding [name]   On: 12/27/2014 21:23   Ct Angio Neck W/cm &/or Hanson/cm  12/28/2014  CLINICAL DATA:  Left cerebellar infarct. EXAM: CT ANGIOGRAPHY HEAD AND NECK TECHNIQUE: Multidetector CT imaging of the head and neck was performed using the standard protocol during bolus administration of intravenous contrast. Multiplanar CT image reconstructions and MIPs were obtained to evaluate the vascular anatomy. Carotid stenosis measurements (when applicable) are obtained utilizing NASCET criteria, using the distal internal carotid diameter as the denominator. CONTRAST:  18mL OMNIPAQUE IOHEXOL 350 MG/ML SOLN COMPARISON:  MRI brain from the same day. FINDINGS: CT HEAD Brain: The 6 mm cerebellar infarct is poorly seen by CT. Periventricular and subcortical white matter changes are again noted bilaterally. No other acute infarct is evident. Calvarium and skull base: Calvarium is intact. Remote nasal bone fractures are noted. Paranasal sinuses: The a left frontal sinus is opacified. There is chronic sclerosis about the left sphenoid sinus. The paranasal sinuses are otherwise clear. The mastoid air cells are clear. Orbits: The globes and orbits are intact. CTA NECK Aortic arch: Dense calcifications  are present about the origin of the innominate artery this narrows the lumen to 2 mm. Right carotid system: The right common carotid artery is otherwise within normal limits. Dense calcifications are present at the carotid bifurcation without a significant stenosis relative to the more distal vessel. There is mild tortuosity of the cervical right ICA without significant stenosis. Left carotid system: The left common carotid artery is within normal limits. Atherosclerotic calcifications are present at the origin of the left internal carotid artery without a significant stenosis relative to the more distal vessel. The cervical left ICA is otherwise normal. Vertebral arteries:The vertebral arteries both originate from the subclavian arteries. The left vertebral artery is the dominant vessel. There is no significant stenosis at the origins. There is some tortuosity without significant stenosis. The vertebral arteries are otherwise within normal limits throughout the neck. The right vertebral artery terminates at the PICA. Skeleton: Degenerative facet changes are most prominent on the left at C4-5 with spurring into the foramen. There is leftward curvature of the cervical spine. No focal lytic or blastic lesions are present. Other neck: Paraseptal and centrilobular emphysematous changes are present. No focal mucosal or submucosal lesions are evident. Vocal cords are midline and symmetric. The salivary glands are unremarkable. The thyroid is within normal limits. There is no significant adenopathy. CTA HEAD Anterior circulation: The internal carotid arteries are within  normal limits through the ICA termini bilaterally. The A1 and M1 segments are normal. The anterior communicating artery is patent. ACA and MCA branch vessels are within normal limits. Posterior circulation: The left vertebral artery is the dominant vessel. The left AICA is dominant. The right vertebral artery essentially terminates at the PICA. No focal  lesion is evident. A right AICA vessel is present as well. The basilar artery is within normal limits. Posterior cerebral arteries receive contribution from P1 segments and posterior communicating arteries bilaterally. The PCA branch vessels are intact. Venous sinuses: The dural sinuses are patent. The straight sinus and deep cerebral veins are intact. Cortical veins are intact. Anatomic variants: None Delayed phase: The postcontrast images demonstrate no pathologic enhancement. IMPRESSION: 1. The left vertebral artery is the dominant vessel. No PICA vessel is stain. A dominant left AICA AA is evident without focal stenosis. 2. The non dominant right vertebral artery essentially terminates at the PICA. 3. Atherosclerotic changes at the carotid bifurcations bilaterally without significant stenosis. 4. 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 Electronically Signed   By: San Morelle [name]   On: 12/28/2014 13:41   Miguel Hanson Contrast  12/28/2014  CLINICAL DATA:  Acute onset garbled speech, unable to move RIGHT side. Vomiting. History of polysubstance abuse, hepatic encephalopathy, Wernicke encephalopathy, seizure disorder, hypertension. EXAM: MRI HEAD WITHOUT CONTRAST TECHNIQUE: Multiplanar, multiecho pulse sequences of the brain and surrounding structures were obtained without intravenous contrast. COMPARISON:  CT head December 27, 2014 an MRI of the brain May 08, 2014 FINDINGS: 7 mm focus of reduced diffusion LEFT cerebellum with corresponding low ADC value. No susceptibility artifact to suggest hemorrhage. Probable LEFT posterior temporal developmental venous anomaly, which was present previously. Moderate global parenchymal brain volume loss, stable from prior imaging. Patchy supratentorial and pontine white matter T2 hyperintensities compatible with chronic small vessel ischemic  disease. No abnormal extra-axial fluid collections. No extra-axial masses though, contrast enhanced sequences would be more sensitive. Normal major intracranial vascular flow voids seen at the skull base. Ocular globes and orbital contents are unremarkable though not tailored for evaluation. No abnormal sellar expansion. No suspicious calvarial bone marrow signal. Craniocervical junction maintained. LEFT frontal sinusitis, mild paranasal sinus mucosal thickening without air-fluid levels. Trace LEFT mastoid effusion. IMPRESSION: Acute 7 mm LEFT cerebellar infarct. Moderate global parenchymal brain volume loss. Moderate white matter changes compatible with chronic small vessel ischemic disease. Electronically Signed   By: Elon Alas [name]   On: 12/28/2014 01:48   Dg Chest Portable 1 View  12/27/2014  CLINICAL DATA:  Altered mental status. EXAM: PORTABLE CHEST 1 VIEW COMPARISON:  12/12/2012 FINDINGS: Single view of the chest demonstrates clear lungs. There is a curvilinear density overlying the right upper chest which appears to represent overlying soft tissues. There is no evidence to suggest a pneumothorax. No focal airspace disease. Heart size is with normal limits. Atherosclerotic calcifications at the aortic arch. No acute bone abnormality. IMPRESSION: No acute chest findings. Electronically Signed   By: Markus Daft [name]   On: 12/27/2014 22:34    Scheduled Meds: . aspirin  300 mg Rectal Daily   Or  . aspirin  325 mg Oral Daily  . atorvastatin  40 mg Oral q1800  . cefTRIAXone (ROCEPHIN)  IV  1 g Intravenous Q24H  . enoxaparin (LOVENOX) injection  40 mg Subcutaneous Q24H  . folic acid  1 mg Oral Daily  .  levETIRAcetam  1,500 mg Intravenous BID  . metoprolol tartrate  12.5 mg Oral BID  . multivitamin with minerals  1 tablet Oral Daily  . Oxcarbazepine  600 mg Oral BID  . thiamine  100 mg Oral Daily   Continuous Infusions:  Antibiotics Given (last 72 hours)    Date/Time Action Medication  Dose Rate   12/28/14 1447 Given   cefTRIAXone (ROCEPHIN) 1 g in dextrose 5 % 50 mL IVPB 1 g 100 mL/hr      Principal Problem:   Acute arterial ischemic stroke, vertebrobasilar, cerebellar ([facility]) Active Problems:   Seizure disorder ([facility])   COPD (chronic obstructive pulmonary disease) ([facility])   Altered mental status   HTN (hypertension), malignant   Protein-calorie malnutrition, severe ([place])   Malnutrition of moderate degree    Time spent: 25 min    [facility] Pager [phone]. If 7PM-7AM, please contact night-coverage at [email or web address], password [facility] 12/29/2014, 12:22 PM  LOS: 2 days

## 2014-12-29 NOTE — Progress Notes (Signed)
STROKE TEAM PROGRESS NOTE  HISTORY Miguel Hanson is an 69 y.o. male, well known to the neurology service, with a past medical history that is pertinent for HTN, polysubstance abuse, hepatic encephalopathy, alcohol withdrawal, question of underlying Wernicke's encephalopathy/Korsakoff syndrome, PTSD, and seizure disorder with some seizures allegedly being alcohol related, brought in due to acute onset of right-sided weakness, inability to speak, and decreased responsiveness. Patient currently unable to contribute to his clinical information, but EMS was called because " at Miguel Hanson wife states pt. Went out on to porch to talk to son and around 30 pt. Started having garbled and repetitive speech. Wife and son assisted pt. To bed. When wife went to check on pt. Wife states pt. Was found sitting on the floor leaning against the bed with vomit on him". Upon arrival to the ED patient noted to be unable to move the right side. Miguel Hanson became more responsive and started regaining muscle strength in the right side while in the ED. However, he was not able to consistently engage in a conversation, answering yes or no questions. Questionable seizure activity noted by ED attending in the ED CT brain did no revealed acute abnormality. Outpatient neurology notes indicated that " he is on Keppra and Trileptal per records but he doesn' t know what he takes at all. He takes a few pills in the morning, afternoon and in the evening but doesn' t know what they are or what doses. No one helps with medications".   Date last known well: 12/27/14 Time last known well: 1830 tPA Given: no, likely seizure NIHSS: 6 MRS: 0  SUBJECTIVE (INTERVAL HISTORY) Patient reports feeling significantly better than he did yesterday.  He reports decreased appetite as his norm.   OBJECTIVE Temp:  [98.4 F (36.9 C)-99.1 F (37.3 C)] 98.4 F (36.9 C) (10/29 1350) Pulse Rate:  [62-78] 62 (10/29 1350) Cardiac Rhythm:  [-] Heart block  (10/29 0703) Resp:  [16-28] 28 (10/29 1350) BP: (97-126)/(63-82) 123/82 mmHg (10/29 1350) SpO2:  [97 %-99 %] 98 % (10/29 1350)   Recent Labs Lab 12/28/14 1115 12/28/14 1625 12/28/14 2150 12/29/14 0642 12/29/14 1129  GLUCAP 141* 121* 110* 94 128*    Recent Labs Lab 12/27/14 2052 12/27/14 2108  NA 133* 138  K 4.0 3.9  CL 100* 101  CO2 23  --   GLUCOSE 112* 108*  BUN <5* 5*  CREATININE 0.75 0.80  CALCIUM 9.0  --     Recent Labs Lab 12/27/14 2052  AST 26  ALT 14*  ALKPHOS 67  BILITOT 0.9  PROT 7.5  ALBUMIN 3.9    Recent Labs Lab 12/27/14 2052 12/27/14 2108  WBC 7.3  --   NEUTROABS 3.4  --   HGB 15.9 17.3*  HCT 42.0 51.0  MCV 94.8  --   PLT 125*  --    No results for input(s): CKTOTAL, CKMB, CKMBINDEX, TROPONINI in the last 168 hours.  Recent Labs  12/27/14 2052  LABPROT 14.9  INR 1.15    Recent Labs  12/27/14 2322  COLORURINE YELLOW  LABSPEC 1.011  PHURINE 7.0  GLUCOSEU NEGATIVE  HGBUR NEGATIVE  BILIRUBINUR NEGATIVE  KETONESUR NEGATIVE  PROTEINUR 30*  UROBILINOGEN 1.0  NITRITE POSITIVE*  LEUKOCYTESUR NEGATIVE       Component Value Date/Time   CHOL 160 12/29/2014 0521   CHOL 216* 08/07/2014 1141   TRIG 86 12/29/2014 0521   HDL 61 12/29/2014 0521   HDL 74 08/07/2014 1141   CHOLHDL 2.6 12/29/2014  0521   CHOLHDL 2.9 08/07/2014 1141   VLDL 17 12/29/2014 0521   LDLCALC 82 12/29/2014 0521   LDLCALC 124* 08/07/2014 1141   Lab Results  Component Value Date   HGBA1C 5.3 08/07/2014      Component Value Date/Time   LABOPIA NONE DETECTED 12/27/2014 2322   LABOPIA NEGATIVE 12/11/2012 1207   COCAINSCRNUR NONE DETECTED 12/27/2014 2322   COCAINSCRNUR NEGATIVE 12/11/2012 1207   LABBENZ NONE DETECTED 12/27/2014 2322   LABBENZ POSITIVE* 12/11/2012 1207   AMPHETMU NONE DETECTED 12/27/2014 2322   AMPHETMU NEGATIVE 12/11/2012 1207   THCU POSITIVE* 12/27/2014 2322   LABBARB NONE DETECTED 12/27/2014 2322     Recent Labs Lab 12/27/14 2050   ETH <5    I have personally reviewed the radiological images below and agree with the radiology interpretations.   Ct Angio neck and Head W/cm &/or Wo Cm 12/28/2014   IMPRESSION:  1. The left vertebral artery is the dominant vessel. No PICA vessel is stain. A dominant left AICA AA is evident without focal stenosis.  2. The non dominant right vertebral artery essentially terminates at the PICA.  3. Atherosclerotic changes at the carotid bifurcations bilaterally without significant stenosis.  4. 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   Ct Head Wo Contrast 12/27/2014   IMPRESSION:  1. No acute intracranial pathology seen on CT.  2. Mild cortical volume loss and scattered small vessel ischemic microangiopathy.  3. Opacification of the left frontal sinus.  Miguel Brain Wo Contrast 12/28/2014   IMPRESSION:  Acute 7 mm LEFT cerebellar infarct.  Moderate global parenchymal brain volume loss.  Moderate white matter changes compatible with chronic small vessel ischemic disease.   Dg Chest Portable 1 View 12/27/2014   IMPRESSION:  No acute chest findings.     2D Echocardiogram  - Left ventricle: The cavity size was normal. Systolic function was normal. The estimated ejection fraction was in the range of 60% to 65%. Wall motion was normal; there were no regional wall motion abnormalities. There was an increased relative contribution of atrial contraction to ventricular filling. Doppler parameters are consistent with abnormal left ventricular relaxation (grade 1 diastolic dysfunction). - Aortic valve: There was trivial regurgitation. - Mitral valve: Calcified annulus. There was trivial regurgitation.  EEG Impression: This awake and drowsy EEG is abnormal due to the presence of: 1. Diffuse slowing of the waking background 2. Additional focal slowing  over the left hemisphere Clinical Correlation of the above findings indicates bilateral cerebral dysfunction that is non-specific in etiology and can be seen with hypoxic/ischemic injury, toxic/metabolic encephalopathies, or medication effect. Additional focal slowing over the left hemisphere indicates focal cerebral dysfunction in this region suggestive of underlying structural or physiologic abnormality. The absence of epileptiform discharges does not rule out a clinical diagnosis of epilepsy. Clinical correlation is advised.    PHYSICAL EXAM  Temp:  [98.4 F (36.9 C)-99.1 F (37.3 C)] 98.4 F (36.9 C) (10/29 1350) Pulse Rate:  [62-78] 62 (10/29 1350) Resp:  [16-28] 28 (10/29 1350) BP: (97-126)/(63-82) 123/82 mmHg (10/29 1350) SpO2:  [97 %-99 %] 98 % (10/29 1350)  General - thin built, well developed, in no apparent distress.  HEENT:  NCAT; PERRL, sclera muddy, nose and throat clear  Cardiovascular - Regular rate and rhythm with no murmur.  Pulmonary:  CTA  GI: abdomen soft, NT, ND  Extremities:  No C/C/E  Mental Status -  Awake, alert, follow commands. Able to state name, "hospital" and month year with cues   Cranial Nerves II - XII - II - blinking to visual threat bilaterally is now consistent; PERRL III, IV, VI - intact lateral gaze bilaterally. V, VII - Facial movement intact bilaterally.  Facial sensation intact VIII - appears to be HOH X - uvula midline XI - shoulder shrug intact bilaterally. XII - Tongue protrusion intact.  Motor Strength - The patient's strength was symmetric in all extremities.  Bulk was normal and fasciculations were absent.    Reflexes - The patient's reflexes were symmetrical in all extremities and he had no pathological reflexes.  Sensory - Light touch intact throughout    Coordination - The patient had normal movements in the hands with no ataxia or dysmetria.  Tremor was absent.  Gait and Station - not  tested.   ASSESSMENT/PLAN Miguel Hanson is a 69 y.o. male with history of seizure disorder likely due to alcohol withdraw, ? Wernicke's encephalopathy, substance abuse, hepatic encephalopathy admitted for confusion, speech difficulty and right side weakness. Questionable seizure activity noted by ED attending in the ED.    Global aphasia with perseveration - metabolic encephalopathy vs. Wernicke's encephalopathy vs. Seizure  Significant seizure history  Not compliant with seizure medication   EEG showed focal slowing over the left hemisphere  previous EEG showed periodic sharp wave discharges in left parietal temporal region  Follows with Dr. Jaynee Eagles in [facility] for seizure  keppra and trileptal level pending  Questionable seizure activity noted by ED attending in the ED  Increased keppra to 1500mg  bid  Continue trileptal 600mg  bid  One time fever 101.8F - on rocephin  UA negative  THC positive  Normal ammonia and liver enzymes  On FA, B1 and multivitamine  Incidental left cerebellar punctate infarct - likely due to small vessel disease  MRI  Left PICA territory punctate infarct  CTA head and neck showed innominate artery high grade stenosis (but asymptomatic so far)  2D Echo  unremarkable  LDL 84  HgbA1c pending  lovenox for VTE prophylaxis  DIET DYS 3 Room service appropriate?: Yes; Fluid consistency:: Thin   No antithrombotic prior to admission, now on aspirin 325 mg daily  Patient counseled to be compliant with his antithrombotic medications  Ongoing aggressive stroke risk factor management  Hypertension  Home meds:   metoprolol Currently on metoprolol  Stable  Patient counseled to be compliant with his blood pressure medications  Hyperlipidemia  Home meds:  none   LDL 84, goal < 70  Add lipitor 40  Continue statin at discharge  Other Stroke Risk Factors  Advanced age  ETOH use  THC abuse  Other Active  Problems  hyponatremia  Other Pertinent History  Follows with Dr. Jaynee Eagles in [facility]    Attending System Review and Plan for 12/29/2014  Neuro  Mental status appears to be much improved; etiology of confusion unclear.  Likely multifactorial  Continue AEDs as ordered  Continue to monitor mental status; determine from family if back to baseline  Cardiac  ECHO EF 60%  Pulmonary  No acute issues  GI  No complaints currently  Heme / ID /Electrolytes:  Last labs on 10/27 B12 and Folate wnl  Hospital day # 2     To contact Stroke Continuity provider, please refer to [email or web address] After hours, contact General Neurology

## 2014-12-29 NOTE — Progress Notes (Signed)
Speech Language Pathology Treatment: Dysphagia  Patient Details Name: Miguel Hanson MRN: [id number] DOB: Nov 10, 1945 Today's Date: 12/29/2014 Time: 4920-1007 SLP Time Calculation (min) (ACUTE ONLY): 12 min  Assessment / Plan / Recommendation Clinical Impression  SLP followed up for dysphagia treatment to ensure current diet tolerance. RN and pt deny difficulty of dysphagia 3 (mechanical soft and thin liquid diet). Observed directly with mechanical soft and thin liquids. No overt signs or symtpoms of aspiration. No further swallow intervention indicated. Pt exhibiting cognitive deficits and confusion. ST to further evaluate cognition    HPI Other Pertinent Information: Miguel Hanson is a 69 y.o. male with a past medical history significant for seizure disorder, history of polysubstance abuse, and recurrent hospitalizations for altered mental status similar to the current one who presents with an episode of confusion and right sided weakness. MRI shows acute 18mm left cerebellar infarct. CXR negative.    Pertinent Vitals Pain Assessment: No/denies pain  SLP Plan       Recommendations Diet recommendations: Dysphagia 3 (mechanical soft);Thin liquid Medication Administration: Whole meds with puree Compensations: Slow rate;Small sips/bites;Minimize environmental distractions Postural Changes and/or Swallow Maneuvers: Seated upright 90 degrees              Oral Care Recommendations: Oral care BID    GO    Arvil Chaco MA, CCC-SLP Acute Care Speech Language Pathologist    Levi Aland 12/29/2014, 3:12 PM

## 2014-12-30 DIAGNOSIS — N390 Urinary tract infection, site not specified: Secondary | ICD-10-CM

## 2014-12-30 LAB — GLUCOSE, CAPILLARY
GLUCOSE-CAPILLARY: 105 mg/dL — AB (ref 65–99)
GLUCOSE-CAPILLARY: 97 mg/dL (ref 65–99)
Glucose-Capillary: 101 mg/dL — ABNORMAL HIGH (ref 65–99)
Glucose-Capillary: 118 mg/dL — ABNORMAL HIGH (ref 65–99)

## 2014-12-30 LAB — LEVETIRACETAM LEVEL: LEVETIRACETAM: 12.7 ug/mL (ref 10.0–40.0)

## 2014-12-30 NOTE — Progress Notes (Signed)
PROGRESS NOTE  Miguel Hanson XKG:[id number] DOB: 1945/04/11 DOA: 12/27/2014 PCP: [place]    HPI/Subjective: Alert and awake, oriented to time and place. Know he is in the hospital for stroke, but nothing more beyond this, has poor insight to disease process, Has difficulty understanding questions , though no weakness appreciated during encounter,  Assessment/Plan: Acute Stroke- MRI + for celebellar- does not appear responsible for his symptoms telemetry -Neuro checks, NIHSS per protocol -Daily aspirin 325 mg -Start atorvastatin 40 mg daily which the patient should be on -LDL: 84, hemoglobin A1c pending -PT/OT/SLP consultation- DYS diet -EEG -Neurology input appreciated  Seizures:  -Increase levetiracetam to 1500 mg BID - levetiracetam/hydroxycarbazepine  level pending,  -Continue Trileptal -EEG showed focal slowing over the left hemisphere  HTN:  -Continue low dose metoprolol from home  Fever -urine culture pending, - nitrate + , rocephin since admission X ray: no acute chest findings  Code Status: full Family Communication: patient Disposition Plan:  Home likely 1-2 days   Consultants:  neuro  Procedures:  None    Objective: Filed Vitals:   12/30/14 0611  BP: 130/84  Pulse: 62  Temp: 98.4 F (36.9 C)  Resp: 20    Intake/Output Summary (Last 24 hours) at 12/30/14 0931 Last data filed at 12/29/14 2136  Gross per 24 hour  Intake      0 ml  Output    400 ml  Net   -400 ml   Filed Weights   12/28/14 0004 12/28/14 1022  Weight: 116 lb 8 oz (52.844 kg) 116 lb 8 oz (52.844 kg)    Exam:   General:  Awake, poor insight  Cardiovascular: rrr  Respiratory: clear  Abdomen: +BS, soft  Musculoskeletal: moves all 4 ext   Data Reviewed: Basic Metabolic Panel:  Recent Labs Lab 12/27/14 2052 12/27/14 2108  NA 133* 138  K 4.0 3.9  CL 100* 101  CO2 23  --   GLUCOSE 112* 108*  BUN <5* 5*  CREATININE 0.75 0.80  CALCIUM 9.0   --    Liver Function Tests:  Recent Labs Lab 12/27/14 2052  AST 26  ALT 14*  ALKPHOS 67  BILITOT 0.9  PROT 7.5  ALBUMIN 3.9   No results for input(s): LIPASE, AMYLASE in the last 168 hours.  Recent Labs Lab 12/27/14 2050  AMMONIA 26   CBC:  Recent Labs Lab 12/27/14 2052 12/27/14 2108  WBC 7.3  --   NEUTROABS 3.4  --   HGB 15.9 17.3*  HCT 42.0 51.0  MCV 94.8  --   PLT 125*  --    Cardiac Enzymes: No results for input(s): CKTOTAL, CKMB, CKMBINDEX, TROPONINI in the last 168 hours. BNP (last 3 results) No results for input(s): BNP in the last 8760 hours.  ProBNP (last 3 results) No results for input(s): PROBNP in the last 8760 hours.  CBG:  Recent Labs Lab 12/29/14 0642 12/29/14 1129 12/29/14 1630 12/29/14 2231 12/30/14 0637  GLUCAP 94 128* 122* 109* 101*    Recent Results (from the past 240 hour(s))  Culture, blood (routine x 2)     Status: None (Preliminary result)   Collection Time: 12/28/14  6:30 AM  Result Value Ref Range Status   Specimen Description BLOOD LEFT ANTECUBITAL  Final   Special Requests BOTTLES DRAWN AEROBIC AND ANAEROBIC 10CC  Final   Culture NO GROWTH 2 DAYS  Final   Report Status PENDING  Incomplete  Culture, blood (routine x 2)  Status: None (Preliminary result)   Collection Time: 12/28/14  6:45 AM  Result Value Ref Range Status   Specimen Description BLOOD RIGHT ANTECUBITAL  Final   Special Requests BOTTLES DRAWN AEROBIC AND ANAEROBIC 10CC  Final   Culture NO GROWTH 2 DAYS  Final   Report Status PENDING  Incomplete     Studies: Ct Angio Head W/cm &/or Wo Cm  12/28/2014  CLINICAL DATA:  Left cerebellar infarct. EXAM: CT ANGIOGRAPHY HEAD AND NECK TECHNIQUE: Multidetector CT imaging of the head and neck was performed using the standard protocol during bolus administration of intravenous contrast. Multiplanar CT image reconstructions and MIPs were obtained to evaluate the vascular anatomy. Carotid stenosis measurements (when  applicable) are obtained utilizing NASCET criteria, using the distal internal carotid diameter as the denominator. CONTRAST:  156mL OMNIPAQUE IOHEXOL 350 MG/ML SOLN COMPARISON:  MRI brain from the same day. FINDINGS: CT HEAD Brain: The 6 mm cerebellar infarct is poorly seen by CT. Periventricular and subcortical white matter changes are again noted bilaterally. No other acute infarct is evident. Calvarium and skull base: Calvarium is intact. Remote nasal bone fractures are noted. Paranasal sinuses: The a left frontal sinus is opacified. There is chronic sclerosis about the left sphenoid sinus. The paranasal sinuses are otherwise clear. The mastoid air cells are clear. Orbits: The globes and orbits are intact. CTA NECK Aortic arch: Dense calcifications are present about the origin of the innominate artery this narrows the lumen to 2 mm. Right carotid system: The right common carotid artery is otherwise within normal limits. Dense calcifications are present at the carotid bifurcation without a significant stenosis relative to the more distal vessel. There is mild tortuosity of the cervical right ICA without significant stenosis. Left carotid system: The left common carotid artery is within normal limits. Atherosclerotic calcifications are present at the origin of the left internal carotid artery without a significant stenosis relative to the more distal vessel. The cervical left ICA is otherwise normal. Vertebral arteries:The vertebral arteries both originate from the subclavian arteries. The left vertebral artery is the dominant vessel. There is no significant stenosis at the origins. There is some tortuosity without significant stenosis. The vertebral arteries are otherwise within normal limits throughout the neck. The right vertebral artery terminates at the PICA. Skeleton: Degenerative facet changes are most prominent on the left at C4-5 with spurring into the foramen. There is leftward curvature of the cervical  spine. No focal lytic or blastic lesions are present. Other neck: Paraseptal and centrilobular emphysematous changes are present. No focal mucosal or submucosal lesions are evident. Vocal cords are midline and symmetric. The salivary glands are unremarkable. The thyroid is within normal limits. There is no significant adenopathy. CTA HEAD Anterior circulation: The internal carotid arteries are within normal limits through the ICA termini bilaterally. The A1 and M1 segments are normal. The anterior communicating artery is patent. ACA and MCA branch vessels are within normal limits. Posterior circulation: The left vertebral artery is the dominant vessel. The left AICA is dominant. The right vertebral artery essentially terminates at the PICA. No focal lesion is evident. A right AICA vessel is present as well. The basilar artery is within normal limits. Posterior cerebral arteries receive contribution from P1 segments and posterior communicating arteries bilaterally. The PCA branch vessels are intact. Venous sinuses: The dural sinuses are patent. The straight sinus and deep cerebral veins are intact. Cortical veins are intact. Anatomic variants: None Delayed phase: The postcontrast images demonstrate no pathologic enhancement. IMPRESSION: 1.  The left vertebral artery is the dominant vessel. No PICA vessel is stain. A dominant left AICA AA is evident without focal stenosis. 2. The non dominant right vertebral artery essentially terminates at the PICA. 3. Atherosclerotic changes at the carotid bifurcations bilaterally without significant stenosis. 4. 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 Electronically Signed   By: San Morelle [name]   On: 12/28/2014 13:41   Ct Angio Neck W/cm &/or Wo/cm  12/28/2014  CLINICAL DATA:  Left cerebellar infarct. EXAM: CT ANGIOGRAPHY HEAD AND NECK  TECHNIQUE: Multidetector CT imaging of the head and neck was performed using the standard protocol during bolus administration of intravenous contrast. Multiplanar CT image reconstructions and MIPs were obtained to evaluate the vascular anatomy. Carotid stenosis measurements (when applicable) are obtained utilizing NASCET criteria, using the distal internal carotid diameter as the denominator. CONTRAST:  114mL OMNIPAQUE IOHEXOL 350 MG/ML SOLN COMPARISON:  MRI brain from the same day. FINDINGS: CT HEAD Brain: The 6 mm cerebellar infarct is poorly seen by CT. Periventricular and subcortical white matter changes are again noted bilaterally. No other acute infarct is evident. Calvarium and skull base: Calvarium is intact. Remote nasal bone fractures are noted. Paranasal sinuses: The a left frontal sinus is opacified. There is chronic sclerosis about the left sphenoid sinus. The paranasal sinuses are otherwise clear. The mastoid air cells are clear. Orbits: The globes and orbits are intact. CTA NECK Aortic arch: Dense calcifications are present about the origin of the innominate artery this narrows the lumen to 2 mm. Right carotid system: The right common carotid artery is otherwise within normal limits. Dense calcifications are present at the carotid bifurcation without a significant stenosis relative to the more distal vessel. There is mild tortuosity of the cervical right ICA without significant stenosis. Left carotid system: The left common carotid artery is within normal limits. Atherosclerotic calcifications are present at the origin of the left internal carotid artery without a significant stenosis relative to the more distal vessel. The cervical left ICA is otherwise normal. Vertebral arteries:The vertebral arteries both originate from the subclavian arteries. The left vertebral artery is the dominant vessel. There is no significant stenosis at the origins. There is some tortuosity without significant stenosis.  The vertebral arteries are otherwise within normal limits throughout the neck. The right vertebral artery terminates at the PICA. Skeleton: Degenerative facet changes are most prominent on the left at C4-5 with spurring into the foramen. There is leftward curvature of the cervical spine. No focal lytic or blastic lesions are present. Other neck: Paraseptal and centrilobular emphysematous changes are present. No focal mucosal or submucosal lesions are evident. Vocal cords are midline and symmetric. The salivary glands are unremarkable. The thyroid is within normal limits. There is no significant adenopathy. CTA HEAD Anterior circulation: The internal carotid arteries are within normal limits through the ICA termini bilaterally. The A1 and M1 segments are normal. The anterior communicating artery is patent. ACA and MCA branch vessels are within normal limits. Posterior circulation: The left vertebral artery is the dominant vessel. The left AICA is dominant. The right vertebral artery essentially terminates at the PICA. No focal lesion is evident. A right AICA vessel is present as well. The basilar artery is within normal limits. Posterior cerebral arteries receive contribution from P1 segments and posterior communicating arteries bilaterally. The PCA branch vessels are intact. Venous sinuses: The dural sinuses are patent. The straight  sinus and deep cerebral veins are intact. Cortical veins are intact. Anatomic variants: None Delayed phase: The postcontrast images demonstrate no pathologic enhancement. IMPRESSION: 1. The left vertebral artery is the dominant vessel. No PICA vessel is stain. A dominant left AICA AA is evident without focal stenosis. 2. The non dominant right vertebral artery essentially terminates at the PICA. 3. Atherosclerotic changes at the carotid bifurcations bilaterally without significant stenosis. 4. High-grade stenosis at the origin of the innominate artery, potentially affecting the right  subclavian artery, right carotid artery, and right vertebral artery flow. Electronically Signed   By: San Morelle [name]   On: 12/28/2014 13:41    Scheduled Meds: . aspirin  300 mg Rectal Daily   Or  . aspirin  325 mg Oral Daily  . atorvastatin  40 mg Oral q1800  . cefTRIAXone (ROCEPHIN)  IV  1 g Intravenous Q24H  . enoxaparin (LOVENOX) injection  40 mg Subcutaneous Q24H  . folic acid  1 mg Oral Daily  . levETIRAcetam  1,500 mg Intravenous BID  . metoprolol tartrate  12.5 mg Oral BID  . multivitamin with minerals  1 tablet Oral Daily  . Oxcarbazepine  600 mg Oral BID  . thiamine  100 mg Oral Daily   Continuous Infusions:  Antibiotics Given (last 72 hours)    Date/Time Action Medication Dose Rate   12/28/14 1447 Given   cefTRIAXone (ROCEPHIN) 1 g in dextrose 5 % 50 mL IVPB 1 g 100 mL/hr   12/29/14 1359 Given   cefTRIAXone (ROCEPHIN) 1 g in dextrose 5 % 50 mL IVPB 1 g 100 mL/hr      Principal Problem:   Acute arterial ischemic stroke, vertebrobasilar, cerebellar (HCC) Active Problems:   Seizure disorder (HCC)   COPD (chronic obstructive pulmonary disease) (HCC)   Altered mental status   HTN (hypertension), malignant   Protein-calorie malnutrition, severe ([place])   Malnutrition of moderate degree    Time spent: 25 min    Desyre Calma MD PhD  [facility] Pager [phone]. If 7PM-7AM, please contact night-coverage at [email or web address], password [facility] 12/30/2014, 9:31 AM  LOS: 3 days

## 2014-12-30 NOTE — Progress Notes (Signed)
STROKE TEAM PROGRESS NOTE  HISTORY Miguel Hanson is an 69 y.o. male, well known to the neurology service, with a past medical history that is pertinent for HTN, polysubstance abuse, hepatic encephalopathy, alcohol withdrawal, question of underlying Wernicke's encephalopathy/Korsakoff syndrome, PTSD, and seizure disorder with some seizures allegedly being alcohol related, brought in due to acute onset of right-sided weakness, inability to speak, and decreased responsiveness. Patient currently unable to contribute to his clinical information, but EMS was called because " at [place] wife states pt. Went out on to porch to talk to son and around 10 pt. Started having garbled and repetitive speech. Wife and son assisted pt. To bed. When wife went to check on pt. Wife states pt. Was found sitting on the floor leaning against the bed with vomit on him". Upon arrival to the ED patient noted to be unable to move the right side. Miguel Hanson became more responsive and started regaining muscle strength in the right side while in the ED. However, he was not able to consistently engage in a conversation, answering yes or no questions. Questionable seizure activity noted by ED attending in the ED CT brain did no revealed acute abnormality. Outpatient neurology notes indicated that " he is on Keppra and Trileptal per records but he doesn' t know what he takes at all. He takes a few pills in the morning, afternoon and in the evening but doesn' t know what they are or what doses. No one helps with medications".   Date last known well: 12/27/14 Time last known well: 1830 tPA Given: no, likely seizure NIHSS: 6 MRS: 0  SUBJECTIVE (INTERVAL HISTORY) No events overnight.  Patient continues to report improvement.  ROS is negative.  RN reports patient continues to have poor appetite but patient denies this   OBJECTIVE Temp:  [98.4 F (36.9 C)-98.8 F (37.1 C)] 98.8 F (37.1 C) (10/30 1355) Pulse Rate:  [54-80] 65  (10/30 1355) Cardiac Rhythm:  [-] Normal sinus rhythm (10/30 0703) Resp:  [18-20] 18 (10/30 1355) BP: (107-130)/(66-84) 126/78 mmHg (10/30 1355) SpO2:  [97 %-100 %] 97 % (10/30 1355)   Recent Labs Lab 12/29/14 1129 12/29/14 1630 12/29/14 2231 12/30/14 0637 12/30/14 1115  GLUCAP 128* 122* 109* 101* 118*    Recent Labs Lab 12/27/14 2052 12/27/14 2108  NA 133* 138  K 4.0 3.9  CL 100* 101  CO2 23  --   GLUCOSE 112* 108*  BUN <5* 5*  CREATININE 0.75 0.80  CALCIUM 9.0  --     Recent Labs Lab 12/27/14 2052  AST 26  ALT 14*  ALKPHOS 67  BILITOT 0.9  PROT 7.5  ALBUMIN 3.9    Recent Labs Lab 12/27/14 2052 12/27/14 2108  WBC 7.3  --   NEUTROABS 3.4  --   HGB 15.9 17.3*  HCT 42.0 51.0  MCV 94.8  --   PLT 125*  --    No results for input(s): CKTOTAL, CKMB, CKMBINDEX, TROPONINI in the last 168 hours.  Recent Labs  12/27/14 2052  LABPROT 14.9  INR 1.15    Recent Labs  12/27/14 2322  COLORURINE YELLOW  LABSPEC 1.011  PHURINE 7.0  GLUCOSEU NEGATIVE  HGBUR NEGATIVE  BILIRUBINUR NEGATIVE  KETONESUR NEGATIVE  PROTEINUR 30*  UROBILINOGEN 1.0  NITRITE POSITIVE*  LEUKOCYTESUR NEGATIVE       Component Value Date/Time   CHOL 160 12/29/2014 0521   CHOL 216* 08/07/2014 1141   TRIG 86 12/29/2014 0521   HDL 61 12/29/2014 0521  HDL 74 08/07/2014 1141   CHOLHDL 2.6 12/29/2014 0521   CHOLHDL 2.9 08/07/2014 1141   VLDL 17 12/29/2014 0521   LDLCALC 82 12/29/2014 0521   LDLCALC 124* 08/07/2014 1141   Lab Results  Component Value Date   HGBA1C 5.3 08/07/2014      Component Value Date/Time   LABOPIA NONE DETECTED 12/27/2014 2322   LABOPIA NEGATIVE 12/11/2012 1207   COCAINSCRNUR NONE DETECTED 12/27/2014 2322   COCAINSCRNUR NEGATIVE 12/11/2012 1207   LABBENZ NONE DETECTED 12/27/2014 2322   LABBENZ POSITIVE* 12/11/2012 1207   AMPHETMU NONE DETECTED 12/27/2014 2322   AMPHETMU NEGATIVE 12/11/2012 1207   THCU POSITIVE* 12/27/2014 2322   LABBARB NONE  DETECTED 12/27/2014 2322     Recent Labs Lab 12/27/14 2050  ETH <5    I have personally reviewed the radiological images below and agree with the radiology interpretations.   Ct Angio neck and Head W/cm &/or Wo Cm 12/28/2014   IMPRESSION:  1. The left vertebral artery is the dominant vessel. No PICA vessel is stain. A dominant left AICA AA is evident without focal stenosis.  2. The non dominant right vertebral artery essentially terminates at the PICA.  3. Atherosclerotic changes at the carotid bifurcations bilaterally without significant stenosis.  4. 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 Not likely related to encephalopathy but should have follow-up  Ct Head Wo Contrast 12/27/2014   IMPRESSION:  1. No acute intracranial pathology seen on CT.  2. Mild cortical volume loss and scattered small vessel ischemic microangiopathy.  3. Opacification of the left frontal sinus.  Miguel Brain Wo Contrast 12/28/2014   IMPRESSION:  Acute 7 mm LEFT cerebellar infarct.  Moderate global parenchymal brain volume loss.  Moderate white matter changes compatible with chronic small vessel ischemic disease.   Dg Chest Portable 1 View 12/27/2014   IMPRESSION:  No acute chest findings.     2D Echocardiogram  - Left ventricle: The cavity size was normal. Systolic function was normal. The estimated ejection fraction was in the range of 60% to 65%. Wall motion was normal; there were no regional wall motion abnormalities. There was an increased relative contribution of atrial contraction to ventricular filling. Doppler parameters are consistent with abnormal left ventricular relaxation (grade 1 diastolic dysfunction). - Aortic valve: There was trivial regurgitation. - Mitral valve: Calcified annulus. There was trivial regurgitation.  EEG Impression: This  awake and drowsy EEG is abnormal due to the presence of: 1. Diffuse slowing of the waking background 2. Additional focal slowing over the left hemisphere Clinical Correlation of the above findings indicates bilateral cerebral dysfunction that is non-specific in etiology and can be seen with hypoxic/ischemic injury, toxic/metabolic encephalopathies, or medication effect. Additional focal slowing over the left hemisphere indicates focal cerebral dysfunction in this region suggestive of underlying structural or physiologic abnormality. The absence of epileptiform discharges does not rule out a clinical diagnosis of epilepsy. Clinical correlation is advised.    PHYSICAL EXAM  Temp:  [98.4 F (36.9 C)-98.8 F (37.1 C)] 98.8 F (37.1 C) (10/30 1355) Pulse Rate:  [54-80] 65 (10/30 1355) Resp:  [18-20] 18 (10/30 1355) BP: (107-130)/(66-84) 126/78 mmHg (10/30 1355) SpO2:  [97 %-100 %] 97 % (10/30 1355)  General - thin built, well developed, in no apparent distress.  HEENT:  NCAT; PERRL, sclera muddy, nose and throat clear  Cardiovascular - Regular rate and rhythm with no murmur.  Pulmonary:  CTA  GI: abdomen soft, NT, ND  Extremities:  No C/C/E  Mental Status -  Awake, alert, follow commands. Able to state name, "hospital" and month year with cues   Cranial Nerves II - XII - II - blinking to visual threat bilaterally is now consistent; PERRL III, IV, VI - intact lateral gaze bilaterally. V, VII - Facial movement intact bilaterally.  Facial sensation intact VIII - appears to be HOH X - uvula midline XI - shoulder shrug intact bilaterally. XII - Tongue protrusion intact.  Motor Strength - The patient's strength was symmetric in all extremities.  Bulk was normal and fasciculations were absent.    Reflexes - The patient's reflexes were symmetrical in all extremities and he had no pathological reflexes.  Sensory - Light touch intact throughout    Coordination - The patient had  normal movements in the hands with no ataxia or dysmetria.  Tremor was absent.  Gait and Station - not tested.   ASSESSMENT/PLAN Miguel. Malakie Balis is a 69 y.o. male with history of seizure disorder likely due to alcohol withdraw, ? Wernicke's encephalopathy, substance abuse, hepatic encephalopathy admitted for confusion, speech difficulty and right side weakness. Questionable seizure activity noted by ED attending in the ED.    Global aphasia with perseveration - metabolic encephalopathy vs. Wernicke's encephalopathy vs. Seizure  Significant seizure history  Not compliant with seizure medication   EEG showed focal slowing over the left hemisphere  previous EEG showed periodic sharp wave discharges in left parietal temporal region  Follows with Dr. Jaynee Eagles in [facility] for seizure  Keppra and trileptal level pending  Questionable seizure activity noted by ED attending in the ED  Continue keppra at 1500mg  bid  Continue trileptal 600mg  bid  One time fever 101.57F - on rocephin  UA negative  THC positive  Normal ammonia and liver enzymes  On FA, B1 and multivitamine  Incidental left cerebellar punctate infarct - likely due to small vessel disease  MRI  Left PICA territory punctate infarct  CTA head and neck showed innominate artery high grade stenosis (but asymptomatic so far)  2D Echo  unremarkable  LDL 84  HgbA1c pending  lovenox for VTE prophylaxis  DIET DYS 3 Room service appropriate?: Yes; Fluid consistency:: Thin   No antithrombotic prior to admission, now on aspirin 325 mg daily  Patient counseled to be compliant with his antithrombotic medications  Ongoing aggressive stroke risk factor management  Hypertension  Home meds:   metoprolol Currently on metoprolol  Stable  Patient counseled to be compliant with his blood pressure medications  Hyperlipidemia  Home meds:  none   LDL 84, goal < 70  Add lipitor 40  Continue statin at discharge  Other  Stroke Risk Factors  Advanced age  ETOH use  THC abuse  Other Active Problems  hyponatremia  Other Pertinent History  Follows with Dr. Jaynee Eagles in [facility]    Attending System Review and Plan for 12/30/2014  Neuro  Mental status appears to be much improved; etiology of confusion unclear.  Likely multifactorial  Continue AEDs as ordered  Continue to monitor mental status; determine from family if back to baseline  Cardiac  ECHO EF 60%  Pulmonary  No acute issues  GI  No complaints currently  Heme / ID /Electrolytes:  Last labs on 10/27 B12 and Folate wnl   The stroke team will sign off at this time. Please call if we can be of further service. Follow-up with Dr. Jaynee Eagles in  [facility] day # 3     To contact Stroke Continuity provider, please refer to [email or web address] After hours, contact General Neurology

## 2014-12-31 DIAGNOSIS — R569 Unspecified convulsions: Secondary | ICD-10-CM

## 2014-12-31 DIAGNOSIS — I6329 Cerebral infarction due to unspecified occlusion or stenosis of other precerebral arteries: Secondary | ICD-10-CM

## 2014-12-31 LAB — URINE CULTURE: Culture: 1000

## 2014-12-31 LAB — GLUCOSE, CAPILLARY
GLUCOSE-CAPILLARY: 95 mg/dL (ref 65–99)
Glucose-Capillary: 113 mg/dL — ABNORMAL HIGH (ref 65–99)

## 2014-12-31 LAB — 10-HYDROXYCARBAZEPINE: TRILIPTAL/MTB(OXCARBAZEPIN): NOT DETECTED ug/mL (ref 10–35)

## 2014-12-31 MED ORDER — ATORVASTATIN CALCIUM 40 MG PO TABS: 40.0000 mg | ORAL_TABLET | Freq: Every day | ORAL | Status: DC

## 2014-12-31 MED ORDER — ASPIRIN 325 MG PO TABS: 325.0000 mg | ORAL_TABLET | Freq: Every day | ORAL | Status: DC

## 2014-12-31 MED ORDER — LEVETIRACETAM 750 MG PO TABS
750.0000 mg | ORAL_TABLET | Freq: Two times a day (BID) | ORAL | Status: DC
Start: 2014-12-31 — End: 2015-05-12

## 2014-12-31 MED ORDER — SULFAMETHOXAZOLE-TRIMETHOPRIM 800-160 MG PO TABS: 1.0000 | ORAL_TABLET | Freq: Two times a day (BID) | ORAL | Status: DC

## 2014-12-31 MED ORDER — OXCARBAZEPINE 600 MG PO TABS: 600.0000 mg | ORAL_TABLET | Freq: Two times a day (BID) | ORAL | Status: DC

## 2014-12-31 MED ORDER — THIAMINE HCL 100 MG PO TABS: 100.0000 mg | ORAL_TABLET | Freq: Every day | ORAL | Status: DC

## 2014-12-31 NOTE — Care Management Important Message (Signed)
Important Message  Patient Details  Name: Miguel Hanson MRN: [id number] Date of Birth: March 05, 1945   Medicare Important Message Given:  Yes-second notification given    Nathen May 12/31/2014, 5:39 PM

## 2014-12-31 NOTE — Progress Notes (Signed)
Speech Language Pathology Treatment: Cognitive-Linquistic  Patient Details Name: Miguel Hanson MRN: [id number] DOB: October 30, 1945 Today's Date: 12/31/2014 Time: 0740-0750 SLP Time Calculation (min) (ACUTE ONLY): 10 min  Assessment / Plan / Recommendation Clinical Impression  Pt seen for skilled SLP intervention including reviewing functional tasks and increasing awareness to deficits.  Pt lying down in bed with nurse tech in room upon SLP arrival.   Pt was oriented to person, location, situation but not date.  He was able to verbalize his home street but not specific number of address nor zip code.  Pt states he writes out bills, cleans house, etc.    Reviewed functional task of strategy to reach RN - use of call bell with pt- using visual/verbal cues.  Unfortunately pt did not recall call bell use within 5 minutes.  He denies changes with cognitive-linguistic skills and given h/o polysubstance abuse, PTSD - SLP uncertain to premorbid status.   Pt admits he can get up on his own without assistance - despite sign in room indicating need to call.    Pt required multiple repetitions of verbal information, appearing confused at times - answering questions off topic.  Pt will benefit from 24/7 supervision due to his cognitive deficits.  Will follow up x1 for family education as do not anticipate pt will generalize information to demonstrate functional improvements.      HPI Other Pertinent Information: Miguel Hanson is a 69 y.o. male with a past medical history significant for seizure disorder, history of polysubstance abuse, and recurrent hospitalizations for altered mental status similar to the current one who presents with an episode of confusion and right sided weakness. MRI shows acute 92mm left cerebellar infarct. CXR negative.   Pt seen for cognitive linguistic treatment today.     Pertinent Vitals Pain Assessment: No/denies pain  SLP Plan  Continue with current plan of care    Recommendations                 Follow up Recommendations:  (uncertain if pt will participate in treatment and also uncertain to baseline function - no family present at this time) Plan: Continue with current plan of care    [place], [place] [facility] SLP [phone]

## 2014-12-31 NOTE — Discharge Summary (Signed)
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 Not likely related to encephalopathy but should have follow-up   Discharge Diagnoses:  Principal Problem:   Seizure disorder ([place]) Active Problems: UTI   Acute arterial ischemic stroke, vertebrobasilar, cerebellar ([place]), incidental finding   COPD (chronic obstructive pulmonary disease) (Uncertain)   Altered mental status   HTN (hypertension), malignant   Protein-calorie malnutrition, severe ([place]) Substance abuse  Discharge Condition: stable  Diet recommendation: heart healthy  Filed Weights   12/28/14 0004 12/28/14 1022  Weight: 52.844 kg (116 lb 8 oz) 52.844 kg (116 lb 8 oz)    History of present illness:   69 y.o. male, well known to the neurology service, with a past medical history that is pertinent for HTN, polysubstance abuse, hepatic encephalopathy, alcohol withdrawal, question of underlying Wernicke's encephalopathy/Korsakoff syndrome, PTSD, and seizure disorder with some seizures allegedly being alcohol related, brought in due to acute onset of right-sided weakness, inability to speak, and decreased responsiveness. Patient currently unable to contribute to his clinical information, but EMS was called because " at [place] wife states pt. Went out on to porch to talk to son and around 81 pt. Started having garbled and repetitive speech. Wife and son assisted pt. To bed. When wife went to check on pt. Wife states pt. Was found sitting on the floor leaning against the bed with vomit on him". Upon  arrival to the ED patient noted to be unable to move the right side. [name] became more responsive and started regaining muscle strength in the right side while in the ED. However, he was not able to consistently engage in a conversation, answering yes or no questions. Questionable seizure activity noted by ED attending in the ED CT brain did no revealed acute abnormality. Outpatient neurology notes indicated that " he is on Keppra and Trileptal per records but he doesn' t know what he takes at all. He takes a few pills in the morning, afternoon and in the evening but doesn' t know what they are or what doses. No one helps with medications".  Hospital Course:  Admitted to telemetry. Neurology consulted.  Presenting symptoms likely seizure/substanc/UTIe related, but also found to have acute stroke, likely incidental finding. Started on ASA, statin, antibiotic. keppra dose increase. By discharge at clinical baseline. Encouraged abstinence from alcohol and drugs, compliance with medications. Home RN arranged  Ct Angio neck and Head W/cm &/or Wo Cm 12/28/2014  IMPRESSION:  1. The left vertebral artery is the dominant vessel. No PICA vessel is stain. A dominant left AICA AA is evident without focal stenosis.  2. The non dominant right vertebral artery essentially terminates at the PICA.  3. Atherosclerotic changes at the carotid bifurcations bilaterally without significant stenosis.  4. 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 Not likely related to encephalopathy but should have follow-up  Ct Head Wo Contrast 12/27/2014  IMPRESSION:  1. No acute intracranial pathology seen on CT.  2. Mild cortical volume loss and scattered small vessel ischemic microangiopathy.  3. Opacification of  the left frontal sinus.  [name] Contrast 12/28/2014  IMPRESSION:  Acute  7 mm LEFT cerebellar infarct.  Moderate global parenchymal brain volume loss.  Moderate white matter changes compatible with chronic small vessel ischemic disease.   Dg Chest Portable 1 View 12/27/2014  IMPRESSION:  No acute chest findings.    2D Echocardiogram - Left ventricle: The cavity size was normal. Systolic function was normal. The estimated ejection fraction was in the range of 60% to 65%. Wall motion was normal; there were no regional wall motion abnormalities. There was an increased relative contribution of atrial contraction to ventricular filling. Doppler parameters are consistent with abnormal left ventricular relaxation (grade 1 diastolic dysfunction). - Aortic valve: There was trivial regurgitation. - Mitral valve: Calcified annulus. There was trivial regurgitation.  EEG Impression: This awake and drowsy EEG is abnormal due to the presence of: 1. Diffuse slowing of the waking background 2. Additional focal slowing over the left hemisphere Clinical Correlation of the above findings indicates bilateral cerebral dysfunction that is non-specific in etiology and can be seen with hypoxic/ischemic injury, toxic/metabolic encephalopathies, or medication effect. Additional focal slowing over the left hemisphere indicates focal cerebral dysfunction in this region suggestive of underlying structural or physiologic abnormality. The absence of epileptiform discharges does not rule out a clinical diagnosis of epilepsy. Clinical correlation is advised.  Global aphasia with perseveration - metabolic encephalopathy vs. Wernicke's encephalopathy vs. Seizure  Significant seizure history  Not compliant with seizure medication   EEG showed focal slowing over the left hemisphere  previous EEG showed periodic sharp wave discharges in left parietal temporal region  Follows with Dr. Jaynee [facility] in [facility] for seizure  Questionable seizure activity noted by ED attending in the  ED  Continue keppra at 1500mg  bid  Continue trileptal 600mg  bid  THC positive  Normal ammonia and liver enzymes  On FA, B1 and multivitamine  Incidental left cerebellar punctate infarct - likely due to small vessel disease  MRI Left PICA territory punctate infarct  CTA head and neck showed innominate artery high grade stenosis (but asymptomatic so far)  2D Echo unremarkable  LDL 84  HgbA1c pending  lovenox for VTE prophylaxis  DIET DYS 3 Room service appropriate?: Yes; Fluid consistency:: Thin   No antithrombotic prior to admission, now on aspirin 325 mg daily  Patient counseled to be compliant with his antithrombotic medications  Ongoing aggressive stroke risk factor management  Hypertension  Home meds: metoprolol  Currently on metoprolol  Stable  Patient counseled to be compliant with his blood pressure medications  Hyperlipidemia  Home meds: none   LDL 84, goal < 70  Add lipitor 40  Continue statin at discharge   Procedures:  none  Consultations:  neurology  Discharge Exam: Filed Vitals:   12/31/14 0958  BP: 150/95  Pulse: 67  Temp: 97.9 F (36.6 C)  Resp: 20    General: alert, oriented and appropriate Cardiovascular: RRR Respiratory: CTA Neuro: speech fluent. CN and motor strength intact  Discharge Instructions   Discharge Instructions    Activity as tolerated - No restrictions    Complete by:  As directed      Ambulatory referral to Neurology    Complete by:  As directed   Pt will follow up with Dr. Jaynee Eagles at [facility] in about 1 month. Thanks.     Ambulatory referral to Neurology    Complete by:  As directed   An appointment is requested in approximately: 1 month. Seizure and stroke  Diet - low sodium heart healthy    Complete by:  As directed      Driving Restrictions    Complete by:  As directed   No driving          Current Discharge Medication List    START taking these medications   Details  aspirin  325 MG tablet Take 1 tablet (325 mg total) by mouth daily.    atorvastatin (LIPITOR) 40 MG tablet Take 1 tablet (40 mg total) by mouth daily at 6 PM. Qty: 30 tablet, Refills: 1    sulfamethoxazole-trimethoprim (BACTRIM DS,SEPTRA DS) 800-160 MG tablet Take 1 tablet by mouth 2 (two) times daily. Until gone Qty: 6 tablet, Refills: 0    thiamine 100 MG tablet Take 1 tablet (100 mg total) by mouth daily. Qty: 30 tablet, Refills: 0      CONTINUE these medications which have CHANGED   Details  levETIRAcetam (KEPPRA) 750 MG tablet Take 1 tablet (750 mg total) by mouth 2 (two) times daily. Qty: 120 tablet, Refills: 1    oxcarbazepine (TRILEPTAL) 600 MG tablet Take 1 tablet (600 mg total) by mouth 2 (two) times daily. Qty: 60 tablet, Refills: 1      CONTINUE these medications which have NOT CHANGED   Details  feeding supplement, ENSURE COMPLETE, (ENSURE COMPLETE) LIQD Take 237 mLs by mouth 2 (two) times daily between meals. Qty: 60 Bottle, Refills: 3    folic acid (FOLVITE) 1 MG tablet Take 1 tablet (1 mg total) by mouth daily. Qty: 30 tablet, Refills: 3    magnesium oxide (MAG-OX) 400 (241.3 MG) MG tablet Take 1 tablet (400 mg total) by mouth daily. Qty: 30 tablet, Refills: 3    metoprolol tartrate (LOPRESSOR) 25 MG tablet Take 0.5 tablets (12.5 mg total) by mouth 2 (two) times daily. Qty: 60 tablet, Refills: 3    Multiple Vitamin (MULTIVITAMIN WITH MINERALS) TABS tablet Take 1 tablet by mouth daily. Qty: 30 tablet, Refills: 0    traZODone (DESYREL) 50 MG tablet Take 1 tablet (50 mg total) by mouth at bedtime as needed for sleep. Qty: 30 tablet, Refills: 0       No Known Allergies Follow-up Information    Follow up with Melvenia Beam, MD. Schedule an appointment as soon as possible for a visit in 1 month.   Specialty:  Neurology   Contact information:   [address] [place] [address] [phone]        The results of significant diagnostics from this  hospitalization (including imaging, microbiology, ancillary and laboratory) are listed below for reference.    Significant Diagnostic Studies: Ct Angio Head W/cm &/or Wo Cm  12/28/2014  CLINICAL DATA:  Left cerebellar infarct. EXAM: CT ANGIOGRAPHY HEAD AND NECK TECHNIQUE: Multidetector CT imaging of the head and neck was performed using the standard protocol during bolus administration of intravenous contrast. Multiplanar CT image reconstructions and MIPs were obtained to evaluate the vascular anatomy. Carotid stenosis measurements (when applicable) are obtained utilizing NASCET criteria, using the distal internal carotid diameter as the denominator. CONTRAST:  179mL OMNIPAQUE IOHEXOL 350 MG/ML SOLN COMPARISON:  MRI brain from the same day. FINDINGS: CT HEAD Brain: The 6 mm cerebellar infarct is poorly seen by CT. Periventricular and subcortical white matter changes are again noted bilaterally. No other acute infarct is evident. Calvarium and skull base: Calvarium is intact. Remote nasal bone fractures are noted. Paranasal sinuses: The a left frontal sinus is opacified. There is chronic sclerosis  about the left sphenoid sinus. The paranasal sinuses are otherwise clear. The mastoid air cells are clear. Orbits: The globes and orbits are intact. CTA NECK Aortic arch: Dense calcifications are present about the origin of the innominate artery this narrows the lumen to 2 mm. Right carotid system: The right common carotid artery is otherwise within normal limits. Dense calcifications are present at the carotid bifurcation without a significant stenosis relative to the more distal vessel. There is mild tortuosity of the cervical right ICA without significant stenosis. Left carotid system: The left common carotid artery is within normal limits. Atherosclerotic calcifications are present at the origin of the left internal carotid artery without a significant stenosis relative to the more distal vessel. The cervical left  ICA is otherwise normal. Vertebral arteries:The vertebral arteries both originate from the subclavian arteries. The left vertebral artery is the dominant vessel. There is no significant stenosis at the origins. There is some tortuosity without significant stenosis. The vertebral arteries are otherwise within normal limits throughout the neck. The right vertebral artery terminates at the PICA. Skeleton: Degenerative facet changes are most prominent on the left at C4-5 with spurring into the foramen. There is leftward curvature of the cervical spine. No focal lytic or blastic lesions are present. Other neck: Paraseptal and centrilobular emphysematous changes are present. No focal mucosal or submucosal lesions are evident. Vocal cords are midline and symmetric. The salivary glands are unremarkable. The thyroid is within normal limits. There is no significant adenopathy. CTA HEAD Anterior circulation: The internal carotid arteries are within normal limits through the ICA termini bilaterally. The A1 and M1 segments are normal. The anterior communicating artery is patent. ACA and MCA branch vessels are within normal limits. Posterior circulation: The left vertebral artery is the dominant vessel. The left AICA is dominant. The right vertebral artery essentially terminates at the PICA. No focal lesion is evident. A right AICA vessel is present as well. The basilar artery is within normal limits. Posterior cerebral arteries receive contribution from P1 segments and posterior communicating arteries bilaterally. The PCA branch vessels are intact. Venous sinuses: The dural sinuses are patent. The straight sinus and deep cerebral veins are intact. Cortical veins are intact. Anatomic variants: None Delayed phase: The postcontrast images demonstrate no pathologic enhancement. IMPRESSION: 1. The left vertebral artery is the dominant vessel. No PICA vessel is stain. A dominant left AICA AA is evident without focal stenosis. 2. The  non dominant right vertebral artery essentially terminates at the PICA. 3. Atherosclerotic changes at the carotid bifurcations bilaterally without significant stenosis. 4. 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 Electronically Signed   By: San Morelle [name]   On: 12/28/2014 13:41   Ct Head Wo Contrast  12/27/2014  CLINICAL DATA:  Code stroke. Garbled speech, acute onset. Initial encounter. EXAM: CT HEAD WITHOUT CONTRAST TECHNIQUE: Contiguous axial images were obtained from the base of the skull through the vertex without intravenous contrast. COMPARISON:  MRI of the brain and CT of the head performed 07/04/2014 FINDINGS: There is no evidence of acute infarction, mass lesion, or intra- or extra-axial hemorrhage on CT. Prominence of the sulci suggests mild cortical volume loss. Scattered periventricular and subcortical white matter change likely reflects small vessel ischemic microangiopathy. Mild cerebellar atrophy is noted. The brainstem and fourth ventricle are within normal limits. The third and lateral ventricles, and basal ganglia are unremarkable in appearance. The cerebral  hemispheres demonstrate grossly normal gray-white differentiation. No mass effect or midline shift is seen. There is no evidence of fracture; visualized osseous structures are unremarkable in appearance. The orbits are within normal limits. There is opacification of the left frontal sinus. The remaining paranasal sinuses and mastoid air cells are well-aerated. No significant soft tissue abnormalities are seen. IMPRESSION: 1. No acute intracranial pathology seen on CT. 2. Mild cortical volume loss and scattered small vessel ischemic microangiopathy. 3. Opacification of the left frontal sinus. These results were called by telephone at the time of interpretation on 12/27/2014 at 9:21 pm to Dr. Davonna Belling, who verbally acknowledged these results. Electronically Signed   By: Garald Balding [name]   On: 12/27/2014 21:23   Ct Angio Neck W/cm &/or Wo/cm  12/28/2014  CLINICAL DATA:  Left cerebellar infarct. EXAM: CT ANGIOGRAPHY HEAD AND NECK TECHNIQUE: Multidetector CT imaging of the head and neck was performed using the standard protocol during bolus administration of intravenous contrast. Multiplanar CT image reconstructions and MIPs were obtained to evaluate the vascular anatomy. Carotid stenosis measurements (when applicable) are obtained utilizing NASCET criteria, using the distal internal carotid diameter as the denominator. CONTRAST:  128mL OMNIPAQUE IOHEXOL 350 MG/ML SOLN COMPARISON:  MRI brain from the same day. FINDINGS: CT HEAD Brain: The 6 mm cerebellar infarct is poorly seen by CT. Periventricular and subcortical white matter changes are again noted bilaterally. No other acute infarct is evident. Calvarium and skull base: Calvarium is intact. Remote nasal bone fractures are noted. Paranasal sinuses: The a left frontal sinus is opacified. There is chronic sclerosis about the left sphenoid sinus. The paranasal sinuses are otherwise clear. The mastoid air cells are clear. Orbits: The globes and orbits are intact. CTA NECK Aortic arch: Dense calcifications are present about the origin of the innominate artery this narrows the lumen to 2 mm. Right carotid system: The right common carotid artery is otherwise within normal limits. Dense calcifications are present at the carotid bifurcation without a significant stenosis relative to the more distal vessel. There is mild tortuosity of the cervical right ICA without significant stenosis. Left carotid system: The left common carotid artery is within normal limits. Atherosclerotic calcifications are present at the origin of the left internal carotid artery without a significant stenosis relative to the more distal vessel. The cervical left ICA is otherwise  normal. Vertebral arteries:The vertebral arteries both originate from the subclavian arteries. The left vertebral artery is the dominant vessel. There is no significant stenosis at the origins. There is some tortuosity without significant stenosis. The vertebral arteries are otherwise within normal limits throughout the neck. The right vertebral artery terminates at the PICA. Skeleton: Degenerative facet changes are most prominent on the left at C4-5 with spurring into the foramen. There is leftward curvature of the cervical spine. No focal lytic or blastic lesions are present. Other neck: Paraseptal and centrilobular emphysematous changes are present. No focal mucosal or submucosal lesions are evident. Vocal cords are midline and symmetric. The salivary glands are unremarkable. The thyroid is within normal limits. There is no significant adenopathy. CTA HEAD Anterior circulation: The internal carotid arteries are within normal limits through the ICA termini bilaterally. The A1 and M1 segments are normal. The anterior communicating artery is patent. ACA and MCA branch vessels are within normal limits. Posterior circulation: The left vertebral artery is the dominant vessel. The left AICA is dominant. The right vertebral artery essentially terminates at the PICA. No focal lesion is evident. A right AICA  vessel is present as well. The basilar artery is within normal limits. Posterior cerebral arteries receive contribution from P1 segments and posterior communicating arteries bilaterally. The PCA branch vessels are intact. Venous sinuses: The dural sinuses are patent. The straight sinus and deep cerebral veins are intact. Cortical veins are intact. Anatomic variants: None Delayed phase: The postcontrast images demonstrate no pathologic enhancement. IMPRESSION: 1. The left vertebral artery is the dominant vessel. No PICA vessel is stain. A dominant left AICA AA is evident without focal stenosis. 2. The non dominant right  vertebral artery essentially terminates at the PICA. 3. Atherosclerotic changes at the carotid bifurcations bilaterally without significant stenosis. 4. 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 Electronically Signed   By: San Morelle [name]   On: 12/28/2014 13:41   [name] Contrast  12/28/2014  CLINICAL DATA:  Acute onset garbled speech, unable to move RIGHT side. Vomiting. History of polysubstance abuse, hepatic encephalopathy, Wernicke encephalopathy, seizure disorder, hypertension. EXAM: MRI HEAD WITHOUT CONTRAST TECHNIQUE: Multiplanar, multiecho pulse sequences of the brain and surrounding structures were obtained without intravenous contrast. COMPARISON:  CT head December 27, 2014 an MRI of the brain May 08, 2014 FINDINGS: 7 mm focus of reduced diffusion LEFT cerebellum with corresponding low ADC value. No susceptibility artifact to suggest hemorrhage. Probable LEFT posterior temporal developmental venous anomaly, which was present previously. Moderate global parenchymal brain volume loss, stable from prior imaging. Patchy supratentorial and pontine white matter T2 hyperintensities compatible with chronic small vessel ischemic disease. No abnormal extra-axial fluid collections. No extra-axial masses though, contrast enhanced sequences would be more sensitive. Normal major intracranial vascular flow voids seen at the skull base. Ocular globes and orbital contents are unremarkable though not tailored for evaluation. No abnormal sellar expansion. No suspicious calvarial bone marrow signal. Craniocervical junction maintained. LEFT frontal sinusitis, mild paranasal sinus mucosal thickening without air-fluid levels. Trace LEFT mastoid effusion. IMPRESSION: Acute 7 mm LEFT cerebellar infarct. Moderate global parenchymal brain volume loss. Moderate white matter changes compatible  with chronic small vessel ischemic disease. Electronically Signed   By: Elon Alas [name]   On: 12/28/2014 01:48   Dg Chest Portable 1 View  12/27/2014  CLINICAL DATA:  Altered mental status. EXAM: PORTABLE CHEST 1 VIEW COMPARISON:  12/12/2012 FINDINGS: Single view of the chest demonstrates clear lungs. There is a curvilinear density overlying the right upper chest which appears to represent overlying soft tissues. There is no evidence to suggest a pneumothorax. No focal airspace disease. Heart size is with normal limits. Atherosclerotic calcifications at the aortic arch. No acute bone abnormality. IMPRESSION: No acute chest findings. Electronically Signed   By: Markus Daft [name]   On: 12/27/2014 22:34    Microbiology: Recent Results (from the past 240 hour(s))  Culture, blood (routine x 2)     Status: None (Preliminary result)   Collection Time: 12/28/14  6:30 AM  Result Value Ref Range Status   Specimen Description BLOOD LEFT ANTECUBITAL  Final   Special Requests BOTTLES DRAWN AEROBIC AND ANAEROBIC 10CC  Final   Culture NO GROWTH 3 DAYS  Final   Report Status PENDING  Incomplete  Culture, blood (routine x 2)     Status: None (Preliminary result)   Collection Time: 12/28/14  6:45 AM  Result Value Ref Range Status   Specimen Description BLOOD RIGHT ANTECUBITAL  Final   Special Requests BOTTLES DRAWN AEROBIC AND ANAEROBIC  10CC  Final   Culture NO GROWTH 3 DAYS  Final   Report Status PENDING  Incomplete     Labs: Basic Metabolic Panel:  Recent Labs Lab 12/27/14 2052 12/27/14 2108  NA 133* 138  K 4.0 3.9  CL 100* 101  CO2 23  --   GLUCOSE 112* 108*  BUN <5* 5*  CREATININE 0.75 0.80  CALCIUM 9.0  --    Liver Function Tests:  Recent Labs Lab 12/27/14 2052  AST 26  ALT 14*  ALKPHOS 67  BILITOT 0.9  PROT 7.5  ALBUMIN 3.9   No results for input(s): LIPASE, AMYLASE in the last 168 hours.  Recent Labs Lab 12/27/14 2050  AMMONIA 26   CBC:  Recent Labs Lab  12/27/14 2052 12/27/14 2108  WBC 7.3  --   NEUTROABS 3.4  --   HGB 15.9 17.3*  HCT 42.0 51.0  MCV 94.8  --   PLT 125*  --    Cardiac Enzymes: No results for input(s): CKTOTAL, CKMB, CKMBINDEX, TROPONINI in the last 168 hours. BNP: BNP (last 3 results) No results for input(s): BNP in the last 8760 hours.  ProBNP (last 3 results) No results for input(s): PROBNP in the last 8760 hours.  CBG:  Recent Labs Lab 12/30/14 0637 12/30/14 1115 12/30/14 1623 12/30/14 2236 12/31/14 0638  GLUCAP 101* 118* 105* 97 95       Signed:  Sulma Ruffino L  [facility] 12/31/2014, 10:56 AM

## 2014-12-31 NOTE — Progress Notes (Signed)
Discharge instructions reviewed with patient/family. All questions answered at this time. RXs given. Transport home by family.  Rockney Grenz, RN 

## 2014-12-31 NOTE — Progress Notes (Signed)
Occupational Therapy Evaluation Patient Details Name: Miguel Hanson MRN: [id number] DOB: 01/30/1946 Today's Date: 12/31/2014    History of Present Illness Adm after family noted decreased responsiveness, unable to talk, right sided weakness. +seizure in ED; MRI + 89mm acute cerebellar infarct (incidental finding as does not explain his symptoms). PMHx-  HTN, polysubstance abuse, hepatic encephalopathy, alcohol withdrawal, question of underlying Wernicke's encephalopathy/Korsakoff syndrome, PTSD, and seizure disorder    Clinical Impression   Pt appears close to baseline level of function regarding mobility and ADL. Recommend sons provide intermittent S at D/C. No further OT indicated at this time. OT signing off.     Follow Up Recommendations  No OT follow up;Supervision - Intermittent    Equipment Recommendations  None recommended by OT    Recommendations for Other Services       Precautions / Restrictions Precautions Precautions: None      Mobility Bed Mobility Overal bed mobility: Independent                Transfers Overall transfer level: Independent Equipment used: None                  Balance Overall balance assessment: No apparent balance deficits (not formally assessed)                                          ADL Overall ADL's : At baseline                                       General ADL Comments: Pt able to complete bathing with set up. No LOB noted     Vision     Perception     Praxis      Pertinent Vitals/Pain Pain Assessment: Faces Faces Pain Scale: No hurt     Hand Dominance Right   Extremity/Trunk Assessment Upper Extremity Assessment Upper Extremity Assessment: Overall WFL for tasks assessed   Lower Extremity Assessment Lower Extremity Assessment: Overall WFL for tasks assessed   Cervical / Trunk Assessment Cervical / Trunk Assessment: Normal   Communication  Communication Communication: Other (comment) (word finding deficits at times)   Cognition Arousal/Alertness: Awake/alert Behavior During Therapy: WFL for tasks assessed/performed Overall Cognitive Status: No family/caregiver present to determine baseline cognitive functioning (most likely at baseline)                     General Comments       Exercises       Shoulder Instructions      Home Living Family/patient expects to be discharged to:: Private residence Living Arrangements: Spouse/significant other   Type of Home: House Home Access: Ramped entrance     Home Layout: One level     Bathroom Shower/Tub: Occupational psychologist: Standard Bathroom Accessibility: Yes How Accessible: Accessible via walker Home Equipment: Walker - 2 wheels   Additional Comments: Pt unable to provide info.  Per chart review   Lives With: Spouse    Prior Functioning/Environment Level of Independence: Independent;Independent with assistive device(s)        Comments: uses RW primarily and helps his wife who is in a w/c. She has [employer] daily.    OT Diagnosis: Generalized weakness;Cognitive deficits   OT Problem List: Decreased activity tolerance  OT Treatment/Interventions:      OT Goals(Current goals can be found in the care plan section) Acute Rehab OT Goals Patient Stated Goal: to go ome OT Goal Formulation: All assessment and education complete, DC therapy  OT Frequency:     Barriers to D/C:            Co-evaluation              End of Session Nurse Communication: Mobility status  Activity Tolerance: Patient tolerated treatment well Patient left: in chair;with call bell/phone within reach;with chair alarm set   Time: 1407-1430 OT Time Calculation (min): 23 min Charges:  OT General Charges $OT Visit: 1 Procedure OT Evaluation $Initial OT Evaluation Tier I: 1 Procedure OT Treatments $Self Care/Home Management : 8-22 mins G-Codes:     Kindra Bickham,HILLARY 01-04-15, 2:32 PM   [facility], OTR/L  [phone] 01-04-15

## 2015-01-01 LAB — VITAMIN B1: Vitamin B1 (Thiamine): 194.3 nmol/L (ref 66.5–200.0)

## 2015-01-01 NOTE — Care Management Note (Signed)
Case Management Note  Patient Details  Name: Miguel Hanson MRN: [id number] Date of Birth: 04-13-1945  Subjective/Objective:                    Action/Plan: Patient discharged yesterday with orders for home health RN. Unable to send out a referral for [facility] due to patient having a primary with the [facility] in [place]. CM left a message for [place] with the [place] system to try and get approval for [facility] services. CM spoke with [place] CSW for the [place] system this am and she states patient has not been seen since 2006. Mechele Claude states patient needs to call and set up an appointment as soon as possible and at that point have [facility] services initiated. CM called Mr Pepper and spoke with his son and informed him of reason [place] not available until patient seen at the [place], and that he needs an appointment ASAP. CM also informed [facility] know [facility], and that [facility] is recommending [facility] RN services. Son voiced understanding.  Expected Discharge Date:                  Expected Discharge Plan:  Home/Self Care  In-House Referral:     Discharge planning Services     Post Acute Care Choice:    Choice offered to:     DME Arranged:    DME Agency:     HH Arranged:    [place] Agency:     Status of Service:  Completed, signed off  Medicare Important Message Given:  Yes-second notification given Date Medicare IM Given:    Medicare IM give by:    Date Additional Medicare IM Given:    Additional Medicare Important Message give by:     If discussed at [place] of Stay Meetings, dates discussed:    Additional Comments:  Pollie Friar, RN 01/01/2015, 9:41 AM

## 2015-01-02 LAB — CULTURE, BLOOD (ROUTINE X 2)
CULTURE: NO GROWTH
Culture: NO GROWTH

## 2015-01-02 LAB — HEMOGLOBIN A1C
HEMOGLOBIN A1C: 4.9 % (ref 4.8–5.6)
MEAN PLASMA GLUCOSE: 94 mg/dL

## 2015-01-31 ENCOUNTER — Telehealth: Payer: Self-pay | Admitting: *Deleted

## 2015-01-31 ENCOUNTER — Ambulatory Visit: Payer: Medicare Other | Admitting: Neurology

## 2015-01-31 NOTE — Telephone Encounter (Signed)
no showed new patient appt 

## 2015-02-04 ENCOUNTER — Encounter: Payer: Self-pay | Admitting: Neurology

## 2015-05-12 ENCOUNTER — Encounter (HOSPITAL_COMMUNITY): Payer: Self-pay | Admitting: Emergency Medicine

## 2015-05-12 ENCOUNTER — Emergency Department (HOSPITAL_COMMUNITY)
Admission: EM | Admit: 2015-05-12 | Discharge: 2015-05-12 | Disposition: A | Discharge status: Home / Self Care | Payer: Medicare Other | Attending: Emergency Medicine | Admitting: Emergency Medicine

## 2015-05-12 DIAGNOSIS — R569 Unspecified convulsions: Secondary | ICD-10-CM | POA: Diagnosis present

## 2015-05-12 DIAGNOSIS — Z79899 Other long term (current) drug therapy: Secondary | ICD-10-CM | POA: Diagnosis not present

## 2015-05-12 DIAGNOSIS — F17210 Nicotine dependence, cigarettes, uncomplicated: Secondary | ICD-10-CM | POA: Diagnosis not present

## 2015-05-12 DIAGNOSIS — F329 Major depressive disorder, single episode, unspecified: Secondary | ICD-10-CM | POA: Diagnosis not present

## 2015-05-12 DIAGNOSIS — Z7982 Long term (current) use of aspirin: Secondary | ICD-10-CM | POA: Insufficient documentation

## 2015-05-12 DIAGNOSIS — I10 Essential (primary) hypertension: Secondary | ICD-10-CM | POA: Insufficient documentation

## 2015-05-12 DIAGNOSIS — R000 Tachycardia, unspecified: Secondary | ICD-10-CM | POA: Insufficient documentation

## 2015-05-12 LAB — CBC WITH DIFFERENTIAL/PLATELET
BASOS PCT: 0 %
Basophils Absolute: 0 10*3/uL (ref 0.0–0.1)
EOS PCT: 5 %
Eosinophils Absolute: 0.4 10*3/uL (ref 0.0–0.7)
HEMATOCRIT: 39.2 % (ref 39.0–52.0)
Hemoglobin: 14.5 g/dL (ref 13.0–17.0)
LYMPHS ABS: 1.4 10*3/uL (ref 0.7–4.0)
Lymphocytes Relative: 19 %
MCH: 35.5 pg — AB (ref 26.0–34.0)
MCHC: 37 g/dL — ABNORMAL HIGH (ref 30.0–36.0)
MCV: 96.1 fL (ref 78.0–100.0)
MONO ABS: 0.7 10*3/uL (ref 0.1–1.0)
Monocytes Relative: 10 %
NEUTROS PCT: 66 %
Neutro Abs: 4.7 10*3/uL (ref 1.7–7.7)
PLATELETS: 111 10*3/uL — AB (ref 150–400)
RBC: 4.08 MIL/uL — ABNORMAL LOW (ref 4.22–5.81)
RDW: 14 % (ref 11.5–15.5)
WBC: 7.2 10*3/uL (ref 4.0–10.5)

## 2015-05-12 LAB — COMPREHENSIVE METABOLIC PANEL
ALBUMIN: 4 g/dL (ref 3.5–5.0)
ALK PHOS: 56 U/L (ref 38–126)
ALT: 23 U/L (ref 17–63)
AST: 47 U/L — AB (ref 15–41)
Anion gap: 12 (ref 5–15)
BUN: 9 mg/dL (ref 6–20)
CALCIUM: 9.2 mg/dL (ref 8.9–10.3)
CO2: 22 mmol/L (ref 22–32)
CREATININE: 0.74 mg/dL (ref 0.61–1.24)
Chloride: 105 mmol/L (ref 101–111)
GFR calc Af Amer: >60 mL/min (ref >60)
GFR calc non Af Amer: >60 mL/min (ref >60)
GLUCOSE: 125 mg/dL — AB (ref 65–99)
Potassium: 3.9 mmol/L (ref 3.5–5.1)
SODIUM: 139 mmol/L (ref 135–145)
Total Bilirubin: 0.8 mg/dL (ref 0.3–1.2)
Total Protein: 7.4 g/dL (ref 6.5–8.1)

## 2015-05-12 LAB — ETHANOL: Alcohol, Ethyl (B): <5 mg/dL (ref <5)

## 2015-05-12 LAB — RAPID URINE DRUG SCREEN, HOSP PERFORMED
AMPHETAMINES: NOT DETECTED
BARBITURATES: NOT DETECTED
Benzodiazepines: NOT DETECTED
COCAINE: NOT DETECTED
Opiates: NOT DETECTED
TETRAHYDROCANNABINOL: NOT DETECTED

## 2015-05-12 LAB — I-STAT TROPONIN, ED: Troponin i, poc: 0.01 ng/mL (ref 0.00–0.08)

## 2015-05-12 MED ORDER — SODIUM CHLORIDE 0.9 % IV SOLN
1000.0000 mg | Freq: Once | INTRAVENOUS | Status: AC
  Administered 2015-05-12: 1000 mg via INTRAVENOUS
  Filled 2015-05-12: qty 10

## 2015-05-12 MED ORDER — VITAMIN B-1 100 MG PO TABS
100.0000 mg | ORAL_TABLET | Freq: Once | ORAL | Status: AC
  Administered 2015-05-12: 100 mg via ORAL
  Filled 2015-05-12: qty 1

## 2015-05-12 MED ORDER — ADULT MULTIVITAMIN W/MINERALS CH
1.0000 | ORAL_TABLET | Freq: Once | ORAL | Status: AC
  Administered 2015-05-12: 1 via ORAL
  Filled 2015-05-12: qty 1

## 2015-05-12 MED ORDER — OXCARBAZEPINE 600 MG PO TABS: 600.0000 mg | ORAL_TABLET | Freq: Two times a day (BID) | ORAL | Status: DC

## 2015-05-12 MED ORDER — LEVETIRACETAM 750 MG PO TABS: 750.0000 mg | ORAL_TABLET | Freq: Two times a day (BID) | ORAL | Status: DC

## 2015-05-12 MED ORDER — OXCARBAZEPINE 300 MG PO TABS
300.0000 mg | ORAL_TABLET | Freq: Once | ORAL | Status: AC
  Administered 2015-05-12: 300 mg via ORAL
  Filled 2015-05-12: qty 1

## 2015-05-12 MED ORDER — SODIUM CHLORIDE 0.9 % IV BOLUS (SEPSIS)
1000.0000 mL | Freq: Once | INTRAVENOUS | Status: AC
  Administered 2015-05-12: 1000 mL via INTRAVENOUS

## 2015-05-12 NOTE — ED Notes (Signed)
Bed: [id number] Expected date:  Expected time:  Means of arrival:  Comments: EMS- Seizure

## 2015-05-12 NOTE — ED Notes (Signed)
Attempted to call family to pick pt up. No answer. Message left on voice mail.   Per patient request, taxi cab [place]) notified. Pt stated he has money to pay for the taxi.  Verbalized understanding of dc instructions, prescriptions and follow up care. Instructed to avoid alcohol. Pt stated he doesn't drink alcohol, just beer.

## 2015-05-12 NOTE — Discharge Instructions (Signed)

## 2015-05-12 NOTE — ED Notes (Addendum)
No seizure activity while in ED.  Tolerating water and snacks without difficulty.    Requested Trileptal PO from pharmacy. (Not stocked in ED)

## 2015-05-12 NOTE — ED Provider Notes (Signed)
CSN: [id number]     Arrival date & time 05/12/15  0740 History   First MD Initiated Contact with Patient 05/12/15 0749     Chief Complaint  Patient presents with  . Seizures     (Consider location/radiation/quality/duration/timing/severity/associated sxs/prior Treatment) HPI Comments: 70 year old male with past medical history including alcohol abuse, seizure disorder, hypertension, drug abuse, history of prolonged QT, medication noncompliance who presents with seizures. History obtained by patient and by EMS. EMS reports that family called 911 after the patient had woken up confused this morning and family witnessed a 2-3 minute seizure. Patient states that he has been out of his Keppra medication for 1-2 weeks. Chart review shows that he missed his last follow-up appointment. He states that currently he feels fine with no complaints of pain. He drinks beer daily, last drink was yesterday afternoon. He denies any fevers, vomiting, cough/cold symptoms, or recent illness.  Patient is a 70 y.o. male presenting with seizures. The history is provided by the patient and a relative.  Seizures   Past Medical History  Diagnosis Date  . Hypertension   . Seizures ([place])   . ETOH abuse   . Drug abuse   . PTSD (post-traumatic stress disorder)   . H/O prolonged Q-T interval on ECG   . Polysubstance abuse   . Alcohol abuse   . Depression    Past Surgical History  Procedure Laterality Date  . No past surgeries     Family History  Problem Relation Age of Onset  . Hypertension Mother   . Hypertension Father    Social History  Substance Use Topics  . Smoking status: Current Some Day Smoker -- 0.50 packs/day for 49 years    Types: Cigarettes  . Smokeless tobacco: Current User  . Alcohol Use: 21.6 oz/week    36 Shots of liquor per week     Comment: 03/01/2013 "drink a pint of brandy q 2 days" "Drinks alcohol often"    Review of Systems  Neurological: Positive for seizures.   10 Systems  reviewed and are negative for acute change except as noted in the HPI.    Allergies  Review of patient's allergies indicates no known allergies.  Home Medications   Prior to Admission medications   Medication Sig Start Date End Date Taking? Authorizing Provider  aspirin 325 MG tablet Take 1 tablet (325 mg total) by mouth daily. 12/31/14  Yes Delfina Redwood, MD  atorvastatin (LIPITOR) 40 MG tablet Take 1 tablet (40 mg total) by mouth daily at 6 PM. 12/31/14  Yes Delfina Redwood, MD  metoprolol tartrate (LOPRESSOR) 25 MG tablet Take 0.5 tablets (12.5 mg total) by mouth 2 (two) times daily. 05/21/14  Yes Florencia Reasons, MD  traZODone (DESYREL) 50 MG tablet Take 1 tablet (50 mg total) by mouth at bedtime as needed for sleep. 07/11/14  Yes Rushil Sherrye Payor, MD  folic acid (FOLVITE) 1 MG tablet Take 1 tablet (1 mg total) by mouth daily. Patient not taking: Reported on 12/31/2014 05/21/14   Florencia Reasons, MD  levETIRAcetam (KEPPRA) 750 MG tablet Take 1 tablet (750 mg total) by mouth 2 (two) times daily. 05/12/15   Sharlett Iles, MD  magnesium oxide (MAG-OX) 400 (241.3 MG) MG tablet Take 1 tablet (400 mg total) by mouth daily. Patient not taking: Reported on 12/31/2014 05/21/14   Florencia Reasons, MD  Multiple Vitamin (MULTIVITAMIN WITH MINERALS) TABS tablet Take 1 tablet by mouth daily. Patient not taking: Reported on 12/31/2014 07/11/14  Riccardo Dubin, MD  oxcarbazepine (TRILEPTAL) 600 MG tablet Take 1 tablet (600 mg total) by mouth 2 (two) times daily. 05/12/15   Sharlett Iles, MD  thiamine 100 MG tablet Take 1 tablet (100 mg total) by mouth daily. Patient not taking: Reported on 05/12/2015 12/31/14   Delfina Redwood, MD   BP 156/100 mmHg  Pulse 94  Temp(Src) 98.2 F (36.8 C) (Oral)  Resp 18  SpO2 96% Physical Exam  Constitutional: He appears well-developed and well-nourished. No distress.  Thin, chronically ill appearing but in NAD  HENT:  Head: Normocephalic and atraumatic.  Mildly dry  mucous membranes  Eyes: Conjunctivae and EOM are normal. Pupils are equal, round, and reactive to light.  Neck: Neck supple.  Cardiovascular: Regular rhythm and normal heart sounds.   No murmur heard. Mildly tachycardic  Pulmonary/Chest: Effort normal and breath sounds normal.  Abdominal: Soft. Bowel sounds are normal. He exhibits no distension. There is no tenderness.  Musculoskeletal: He exhibits no edema.  Neurological: He is alert.  Fluent speech, occasional episodes of confusion during conversation but able to follow most commands, normal strength and sensation throughout  Skin: Skin is warm and dry.  Psychiatric: He has a normal mood and affect. Judgment normal.  Nursing note and vitals reviewed.   ED Course  Procedures (including critical care time) Labs Review Labs Reviewed  COMPREHENSIVE METABOLIC PANEL - Abnormal; Notable for the following:    Glucose, Bld 125 (*)    AST 47 (*)    All other components within normal limits  CBC WITH DIFFERENTIAL/PLATELET - Abnormal; Notable for the following:    RBC 4.08 (*)    MCH 35.5 (*)    MCHC 37.0 (*)    Platelets 111 (*)    All other components within normal limits  ETHANOL  URINE RAPID DRUG SCREEN, [place], ED    Imaging Review No results found. I have personally reviewed and evaluated these lab results as part of my medical decision-making.   EKG Interpretation   Date/Time:  Sunday May 12 2015 07:43:10 EDT Ventricular Rate:  95 PR Interval:  208 QRS Duration: 95 QT Interval:  406 QTC Calculation: 510 R Axis:   69 Text Interpretation:  Sinus rhythm Borderline prolonged PR interval  Abnormal R-wave progression, early transition Minimal ST elevation,  inferior leads Prolonged QT interval No significant change since last  tracing Confirmed by Marge Vandermeulen MD, Harolyn Cocker [phone]) on 05/12/2015 8:12:35 AM     Medications  Oxcarbazepine (TRILEPTAL) tablet 300 mg (not administered)  levETIRAcetam  (KEPPRA) 1,000 mg in sodium chloride 0.9 % 100 mL IVPB (0 mg Intravenous Stopped 05/12/15 0850)  sodium chloride 0.9 % bolus 1,000 mL (0 mLs Intravenous Stopped 05/12/15 1057)  thiamine (VITAMIN B-1) tablet 100 mg (100 mg Oral Given 05/12/15 0837)  multivitamin with minerals tablet 1 tablet (1 tablet Oral Given 05/12/15 0837)    MDM   Final diagnoses:  Seizure ([place])   Pt with history of alcohol abuse, seizure disorder, and medication noncompliance presents with seizure that occurred this morning, witnessed by family. On arrival, the patient was awake, alert, chronically ill-appearing but in no acute distress. Vital signs notable for borderline tachycardia, mild hypertension. Patient was able to follow commands, slight confusion during conversation but no tremulousness or diaphoresis to suggest acute alcohol withdrawal. He has been out of his Keppra for at least one week and I suspect that seizures related to medication noncompliance. Gave the patient 1 g Keppra  load, thiamine, multivitamin, and IV fluid bolus. EKG shows prolonged QT, unchanged from previous. Later gave home dose trileptal.   Labwork here is reassuring with normal CMP, negative UDS. After several hours of observation, the patient remains comfortable on reexamination with no seizure-like activity and no tremulousness to suggest acute alcohol withdrawal. He is eating and drinking normally. I suspect his noncompliance also seizure activity. Provided with a refill of Keppra and Trileptal and emphasized importance of scheduling appointment with his neurologist as soon as possible. Patient voiced understanding and was discharged in satisfactory condition.  Sharlett Iles, MD 05/12/15 1136

## 2015-05-12 NOTE — ED Notes (Signed)
Pt aware of urine sample. Urinal in hand

## 2015-05-12 NOTE — ED Notes (Addendum)
11 yom from home presents via EMS after family called 911 for seizure. Per family, woke up confused this morning. Family witnessed seizure lasting ~2-3 mins. No trauma. Pt has been out of keppra for 1-2 weeks. EMS found pt post-ictal. Now awake, alert and oriented. Asymptomatic.   Glucose 172 , #20g left forearm. 156/110, HR 106, satting 94% on r/a for EMS.

## 2015-06-27 ENCOUNTER — Emergency Department (HOSPITAL_COMMUNITY): Payer: Medicare Other

## 2015-06-27 ENCOUNTER — Observation Stay (HOSPITAL_COMMUNITY): Payer: Medicare Other

## 2015-06-27 ENCOUNTER — Observation Stay (HOSPITAL_COMMUNITY)
Admission: EM | Admit: 2015-06-27 | Discharge: 2015-06-30 | Disposition: A | Discharge status: Home / Self Care | Payer: Medicare Other | Attending: Internal Medicine | Admitting: Internal Medicine

## 2015-06-27 ENCOUNTER — Encounter (HOSPITAL_COMMUNITY): Payer: Self-pay | Admitting: Emergency Medicine

## 2015-06-27 DIAGNOSIS — W19XXXA Unspecified fall, initial encounter: Secondary | ICD-10-CM | POA: Diagnosis not present

## 2015-06-27 DIAGNOSIS — Y92009 Unspecified place in unspecified non-institutional (private) residence as the place of occurrence of the external cause: Secondary | ICD-10-CM | POA: Insufficient documentation

## 2015-06-27 DIAGNOSIS — Z79899 Other long term (current) drug therapy: Secondary | ICD-10-CM | POA: Diagnosis not present

## 2015-06-27 DIAGNOSIS — I5032 Chronic diastolic (congestive) heart failure: Secondary | ICD-10-CM | POA: Diagnosis not present

## 2015-06-27 DIAGNOSIS — R569 Unspecified convulsions: Secondary | ICD-10-CM

## 2015-06-27 DIAGNOSIS — I110 Hypertensive heart disease with heart failure: Secondary | ICD-10-CM | POA: Diagnosis not present

## 2015-06-27 DIAGNOSIS — Z8673 Personal history of transient ischemic attack (TIA), and cerebral infarction without residual deficits: Secondary | ICD-10-CM | POA: Insufficient documentation

## 2015-06-27 DIAGNOSIS — J449 Chronic obstructive pulmonary disease, unspecified: Secondary | ICD-10-CM | POA: Diagnosis not present

## 2015-06-27 DIAGNOSIS — Z9119 Patient's noncompliance with other medical treatment and regimen: Secondary | ICD-10-CM | POA: Insufficient documentation

## 2015-06-27 DIAGNOSIS — F17210 Nicotine dependence, cigarettes, uncomplicated: Secondary | ICD-10-CM | POA: Insufficient documentation

## 2015-06-27 DIAGNOSIS — T426X6A Underdosing of other antiepileptic and sedative-hypnotic drugs, initial encounter: Secondary | ICD-10-CM | POA: Diagnosis not present

## 2015-06-27 DIAGNOSIS — F1010 Alcohol abuse, uncomplicated: Secondary | ICD-10-CM | POA: Insufficient documentation

## 2015-06-27 DIAGNOSIS — R410 Disorientation, unspecified: Secondary | ICD-10-CM

## 2015-06-27 DIAGNOSIS — D696 Thrombocytopenia, unspecified: Secondary | ICD-10-CM | POA: Insufficient documentation

## 2015-06-27 DIAGNOSIS — G9340 Encephalopathy, unspecified: Secondary | ICD-10-CM | POA: Insufficient documentation

## 2015-06-27 DIAGNOSIS — Z91128 Patient's intentional underdosing of medication regimen for other reason: Secondary | ICD-10-CM | POA: Diagnosis not present

## 2015-06-27 DIAGNOSIS — Y900 Blood alcohol level of less than 20 mg/100 ml: Secondary | ICD-10-CM | POA: Diagnosis not present

## 2015-06-27 DIAGNOSIS — F329 Major depressive disorder, single episode, unspecified: Secondary | ICD-10-CM | POA: Diagnosis not present

## 2015-06-27 DIAGNOSIS — G40909 Epilepsy, unspecified, not intractable, without status epilepticus: Principal | ICD-10-CM | POA: Insufficient documentation

## 2015-06-27 DIAGNOSIS — R4182 Altered mental status, unspecified: Secondary | ICD-10-CM | POA: Diagnosis present

## 2015-06-27 DIAGNOSIS — Z7982 Long term (current) use of aspirin: Secondary | ICD-10-CM | POA: Diagnosis not present

## 2015-06-27 LAB — CBC WITH DIFFERENTIAL/PLATELET
BASOS ABS: 0 10*3/uL (ref 0.0–0.1)
BASOS PCT: 1 %
EOS ABS: 0.2 10*3/uL (ref 0.0–0.7)
EOS PCT: 2 %
HCT: 38.6 % — ABNORMAL LOW (ref 39.0–52.0)
Hemoglobin: 14.3 g/dL (ref 13.0–17.0)
Lymphocytes Relative: 26 %
Lymphs Abs: 2.1 10*3/uL (ref 0.7–4.0)
MCH: 35.8 pg — ABNORMAL HIGH (ref 26.0–34.0)
MCHC: 37 g/dL — ABNORMAL HIGH (ref 30.0–36.0)
MCV: 96.5 fL (ref 78.0–100.0)
MONO ABS: 0.7 10*3/uL (ref 0.1–1.0)
Monocytes Relative: 8 %
Neutro Abs: 5 10*3/uL (ref 1.7–7.7)
Neutrophils Relative %: 63 %
PLATELETS: 134 10*3/uL — AB (ref 150–400)
RBC: 4 MIL/uL — AB (ref 4.22–5.81)
RDW: 12.9 % (ref 11.5–15.5)
WBC: 7.9 10*3/uL (ref 4.0–10.5)

## 2015-06-27 LAB — COMPREHENSIVE METABOLIC PANEL
ALBUMIN: 4.4 g/dL (ref 3.5–5.0)
ALK PHOS: 65 U/L (ref 38–126)
ALT: 15 U/L — ABNORMAL LOW (ref 17–63)
ANION GAP: 12 (ref 5–15)
AST: 31 U/L (ref 15–41)
BILIRUBIN TOTAL: 0.8 mg/dL (ref 0.3–1.2)
BUN: 13 mg/dL (ref 6–20)
CALCIUM: 9.6 mg/dL (ref 8.9–10.3)
CO2: 25 mmol/L (ref 22–32)
Chloride: 104 mmol/L (ref 101–111)
Creatinine, Ser: 0.74 mg/dL (ref 0.61–1.24)
GFR calc non Af Amer: >60 mL/min (ref >60)
GLUCOSE: 112 mg/dL — AB (ref 65–99)
Potassium: 4.8 mmol/L (ref 3.5–5.1)
Sodium: 141 mmol/L (ref 135–145)
TOTAL PROTEIN: 8 g/dL (ref 6.5–8.1)

## 2015-06-27 LAB — URINALYSIS, ROUTINE W REFLEX MICROSCOPIC
Bilirubin Urine: NEGATIVE
GLUCOSE, UA: NEGATIVE mg/dL
Hgb urine dipstick: NEGATIVE
KETONES UR: NEGATIVE mg/dL
LEUKOCYTES UA: NEGATIVE
NITRITE: NEGATIVE
PROTEIN: NEGATIVE mg/dL
Specific Gravity, Urine: 1.013 (ref 1.005–1.030)
pH: 7.5 (ref 5.0–8.0)

## 2015-06-27 LAB — RAPID URINE DRUG SCREEN, HOSP PERFORMED
Amphetamines: NOT DETECTED
BENZODIAZEPINES: NOT DETECTED
Barbiturates: NOT DETECTED
COCAINE: NOT DETECTED
OPIATES: NOT DETECTED
Tetrahydrocannabinol: NOT DETECTED

## 2015-06-27 LAB — ETHANOL: Alcohol, Ethyl (B): <5 mg/dL (ref <5)

## 2015-06-27 LAB — TROPONIN I: Troponin I: <0.03 ng/mL (ref <0.031)

## 2015-06-27 LAB — AMMONIA: Ammonia: 17 umol/L (ref 9–35)

## 2015-06-27 MED ORDER — SODIUM CHLORIDE 0.9 % IV SOLN
1000.0000 mg | Freq: Once | INTRAVENOUS | Status: AC
  Administered 2015-06-27: 1000 mg via INTRAVENOUS
  Filled 2015-06-27: qty 10

## 2015-06-27 MED ORDER — HYDRALAZINE HCL 20 MG/ML IJ SOLN
5.0000 mg | Freq: Four times a day (QID) | INTRAMUSCULAR | Status: DC | PRN
Start: 2015-06-27 — End: 2015-06-30

## 2015-06-27 MED ORDER — THIAMINE HCL 100 MG/ML IJ SOLN
Freq: Once | INTRAVENOUS | Status: AC
  Administered 2015-06-27: 12:00:00 via INTRAVENOUS
  Filled 2015-06-27: qty 1000

## 2015-06-27 MED ORDER — FOLIC ACID 1 MG PO TABS
1.0000 mg | ORAL_TABLET | Freq: Every day | ORAL | Status: DC
  Administered 2015-06-27 – 2015-06-30 (×4): 1 mg via ORAL
  Filled 2015-06-27 (×4): qty 1

## 2015-06-27 MED ORDER — ATORVASTATIN CALCIUM 40 MG PO TABS
40.0000 mg | ORAL_TABLET | Freq: Every day | ORAL | Status: DC
  Administered 2015-06-27 – 2015-06-29 (×3): 40 mg via ORAL
  Filled 2015-06-27 (×4): qty 1

## 2015-06-27 MED ORDER — VITAMIN B-1 100 MG PO TABS
100.0000 mg | ORAL_TABLET | Freq: Every day | ORAL | Status: DC
  Administered 2015-06-27 – 2015-06-30 (×4): 100 mg via ORAL
  Filled 2015-06-27 (×4): qty 1

## 2015-06-27 MED ORDER — ACETAMINOPHEN 325 MG PO TABS: 650.0000 mg | ORAL_TABLET | Freq: Four times a day (QID) | ORAL | Status: DC | PRN

## 2015-06-27 MED ORDER — SODIUM CHLORIDE 0.9% FLUSH
3.0000 mL | Freq: Two times a day (BID) | INTRAVENOUS | Status: DC
  Administered 2015-06-30: 3 mL via INTRAVENOUS

## 2015-06-27 MED ORDER — LORAZEPAM 2 MG/ML IJ SOLN: 1.0000 mg | Freq: Four times a day (QID) | INTRAMUSCULAR | Status: DC | PRN

## 2015-06-27 MED ORDER — LORAZEPAM 2 MG/ML IJ SOLN
1.0000 mg | Freq: Once | INTRAMUSCULAR | Status: AC
  Administered 2015-06-27: 1 mg via INTRAVENOUS
  Filled 2015-06-27: qty 1

## 2015-06-27 MED ORDER — MORPHINE SULFATE (PF) 2 MG/ML IV SOLN
1.0000 mg | INTRAVENOUS | Status: DC | PRN
Start: 2015-06-27 — End: 2015-06-30

## 2015-06-27 MED ORDER — DOCUSATE SODIUM 100 MG PO CAPS
100.0000 mg | ORAL_CAPSULE | Freq: Two times a day (BID) | ORAL | Status: DC
  Administered 2015-06-27 – 2015-06-30 (×4): 100 mg via ORAL
  Filled 2015-06-27 (×6): qty 1

## 2015-06-27 MED ORDER — ACETAMINOPHEN 650 MG RE SUPP: 650.0000 mg | Freq: Four times a day (QID) | RECTAL | Status: DC | PRN

## 2015-06-27 MED ORDER — OXCARBAZEPINE 300 MG PO TABS
600.0000 mg | ORAL_TABLET | Freq: Two times a day (BID) | ORAL | Status: DC
  Administered 2015-06-27 – 2015-06-30 (×6): 600 mg via ORAL
  Filled 2015-06-27 (×7): qty 2

## 2015-06-27 MED ORDER — METOPROLOL TARTRATE 25 MG PO TABS
25.0000 mg | ORAL_TABLET | Freq: Once | ORAL | Status: AC
  Administered 2015-06-27: 25 mg via ORAL
  Filled 2015-06-27: qty 1

## 2015-06-27 MED ORDER — SODIUM CHLORIDE 0.9 % IV SOLN
INTRAVENOUS | Status: AC
  Administered 2015-06-27: 18:00:00 via INTRAVENOUS

## 2015-06-27 MED ORDER — OXYCODONE HCL 5 MG PO TABS: 5.0000 mg | ORAL_TABLET | ORAL | Status: DC | PRN

## 2015-06-27 MED ORDER — SODIUM CHLORIDE 0.9 % IV SOLN
750.0000 mg | Freq: Two times a day (BID) | INTRAVENOUS | Status: DC
  Administered 2015-06-27: 750 mg via INTRAVENOUS
  Filled 2015-06-27 (×2): qty 7.5

## 2015-06-27 MED ORDER — METOPROLOL TARTRATE 25 MG PO TABS
12.5000 mg | ORAL_TABLET | Freq: Two times a day (BID) | ORAL | Status: DC
  Administered 2015-06-27 – 2015-06-30 (×6): 12.5 mg via ORAL
  Filled 2015-06-27 (×6): qty 1

## 2015-06-27 MED ORDER — SODIUM CHLORIDE 0.9 % IV SOLN
INTRAVENOUS | Status: DC
  Administered 2015-06-28 – 2015-06-29 (×3): via INTRAVENOUS

## 2015-06-27 MED ORDER — LORAZEPAM 1 MG PO TABS: 1.0000 mg | ORAL_TABLET | Freq: Four times a day (QID) | ORAL | Status: DC | PRN

## 2015-06-27 MED ORDER — ADULT MULTIVITAMIN W/MINERALS CH
1.0000 | ORAL_TABLET | Freq: Every day | ORAL | Status: DC
  Administered 2015-06-27 – 2015-06-30 (×4): 1 via ORAL
  Filled 2015-06-27 (×4): qty 1

## 2015-06-27 MED ORDER — THIAMINE HCL 100 MG/ML IJ SOLN: 100.0000 mg | Freq: Every day | INTRAMUSCULAR | Status: DC

## 2015-06-27 MED ORDER — ASPIRIN EC 81 MG PO TBEC
81.0000 mg | DELAYED_RELEASE_TABLET | Freq: Every day | ORAL | Status: DC
  Administered 2015-06-27 – 2015-06-30 (×4): 81 mg via ORAL
  Filled 2015-06-27 (×5): qty 1

## 2015-06-27 NOTE — ED Notes (Signed)
Recollect on LAV have been sent to lab

## 2015-06-27 NOTE — ED Notes (Signed)
Bed: WA15 Expected date:  Expected time:  Means of arrival:  Comments: RES B 

## 2015-06-27 NOTE — ED Notes (Signed)
Patient transported to MRI 

## 2015-06-27 NOTE — ED Notes (Signed)
Pt found to be incontinent of urine.  Linens changed and condom cath applied.    Pt continues to answer questions with "yes," "no," or "nothin."

## 2015-06-27 NOTE — ED Notes (Signed)
Per EMS-witnessed seizure was found on floor-has been out of his Dilantin and BP meds for over a week-[facility] not [facility]-here to get help with scripts-post ictal when EMS arrived-placed in [place]-

## 2015-06-27 NOTE — ED Provider Notes (Signed)
CSN: [id number]     Arrival date & time 06/27/15  1004 History   First MD Initiated Contact with Patient 06/27/15 1010     Chief Complaint  Patient presents with  . Seizures     (Consider location/radiation/quality/duration/timing/severity/associated sxs/prior Treatment) HPI Patient has a history of seizure disorder. He was found on the floor when his wife's home health nurse arrived at their home. The patient's wife had heard him fall in the morning. He reportedly did not spend the night on the floor. He had presumed seizure as he has not had seizure medications for over a week. No one actually witnessed the event. Medics reported him to be post ictal. No additional historians are present at this time. The patient reports he does not recall what happened. He is denying localizing pain. Reportedly he has been out of all of his medications for a week. The patient is answering questions but is a very limited historian and seems unreliable for history. Review of EMR indicates history of chronic alcohol use and medication noncompliance. Past Medical History  Diagnosis Date  . Hypertension   . Seizures ([place])   . ETOH abuse   . Drug abuse   . PTSD (post-traumatic stress disorder)   . H/O prolonged Q-T interval on ECG   . Polysubstance abuse   . Alcohol abuse   . Depression    Past Surgical History  Procedure Laterality Date  . No past surgeries     Family History  Problem Relation Age of Onset  . Hypertension Mother   . Hypertension Father    Social History  Substance Use Topics  . Smoking status: Current Some Day Smoker -- 0.50 packs/day for 49 years    Types: Cigarettes  . Smokeless tobacco: Current User  . Alcohol Use: 21.6 oz/week    36 Shots of liquor per week     Comment: 03/01/2013 "drink a pint of brandy q 2 days" "Drinks alcohol often"    Review of Systems Cannot obtain review of systems due to patient condition. Level V caveat dementia or confusion   Allergies   Review of patient's allergies indicates no known allergies.  Home Medications   Prior to Admission medications   Medication Sig Start Date End Date Taking? Authorizing Provider  levETIRAcetam (KEPPRA) 750 MG tablet Take 1 tablet (750 mg total) by mouth 2 (two) times daily. 05/12/15  Yes Sharlett Iles, MD  oxcarbazepine (TRILEPTAL) 600 MG tablet Take 1 tablet (600 mg total) by mouth 2 (two) times daily. 05/12/15  Yes Sharlett Iles, MD  aspirin 325 MG tablet Take 1 tablet (325 mg total) by mouth daily. 12/31/14   Delfina Redwood, MD  atorvastatin (LIPITOR) 40 MG tablet Take 1 tablet (40 mg total) by mouth daily at 6 PM. 12/31/14   Delfina Redwood, MD  folic acid (FOLVITE) 1 MG tablet Take 1 tablet (1 mg total) by mouth daily. Patient not taking: Reported on 12/31/2014 05/21/14   Florencia Reasons, MD  metoprolol tartrate (LOPRESSOR) 25 MG tablet Take 0.5 tablets (12.5 mg total) by mouth 2 (two) times daily. 05/21/14   Florencia Reasons, MD  traZODone (DESYREL) 50 MG tablet Take 1 tablet (50 mg total) by mouth at bedtime as needed for sleep. 07/11/14   Rushil Sherrye Payor, MD   BP 152/105 mmHg  Pulse 66  Temp(Src) 98.8 F (37.1 C) (Oral)  Resp 18  SpO2 98% Physical Exam  Constitutional:  Patient is thin and fairly cachectic. He is  awake with good eye contact. No acute respiratory distress. He has a generally confused appearance.  HENT:  Head: Normocephalic and atraumatic.  Right Ear: External ear normal.  Left Ear: External ear normal.  Nose: Nose normal.  Mouth/Throat: Oropharynx is clear and moist.  Eyes: EOM are normal. Pupils are equal, round, and reactive to light.  Neck: Neck supple.  The patient does not endorse cervical spine tenderness to palpation over the bony prominences.  Cardiovascular: Normal rate, regular rhythm, normal heart sounds and intact distal pulses.   Pulmonary/Chest: Effort normal and breath sounds normal.  Abdominal: Soft. Bowel sounds are normal. He exhibits no  distension. There is no tenderness.  Musculoskeletal: Normal range of motion. He exhibits no edema or tenderness.  Neurological: He is alert. He has normal strength. GCS eye subscore is 4. GCS verbal subscore is 5. GCS motor subscore is 6.  Patient is generally confused. His answers are repetitive and mostly replies no or nothing. He cannot answer any complex questions. He does assist and following commands. He is moving all 4 extremities spontaneously. He will follow commands to perform grip and flexion and extension of the feet however it is rather sporadic and sometimes delayed.  Skin: Skin is warm, dry and intact.  Psychiatric: He has a normal mood and affect.    ED Course  Procedures (including critical care time) Labs Review Labs Reviewed  COMPREHENSIVE METABOLIC PANEL - Abnormal; Notable for the following:    Glucose, Bld 112 (*)    ALT 15 (*)    All other components within normal limits  CBC WITH DIFFERENTIAL/PLATELET - Abnormal; Notable for the following:    RBC 4.00 (*)    HCT 38.6 (*)    MCH 35.8 (*)    MCHC 37.0 (*)    Platelets 134 (*)    All other components within normal limits  ETHANOL  TROPONIN I  URINALYSIS, ROUTINE W REFLEX MICROSCOPIC (NOT AT [facility])  URINE RAPID DRUG SCREEN, HOSP PERFORMED    Imaging Review Ct Head Wo Contrast  06/27/2015  CLINICAL DATA:  Fall.  Seizure.  Decreased left grip EXAM: CT HEAD WITHOUT CONTRAST TECHNIQUE: Contiguous axial images were obtained from the base of the skull through the vertex without intravenous contrast. COMPARISON:  CT 12/27/2014 FINDINGS: Moderate atrophy.  Negative for hydrocephalus. Chronic microvascular ischemic change throughout the white matter. Negative for acute infarct. Negative for acute hemorrhage or mass. No shift of the midline structures. Negative for skull fracture. Chronic bony thickening left sphenoid sinus related to chronic sinusitis. This is stable. Opacification left frontal sinus unchanged. IMPRESSION:  Atrophy and chronic microvascular ischemia.  No acute abnormality. Electronically Signed   By: Franchot Gallo [name]   On: 06/27/2015 12:31   I have personally reviewed and evaluated these images and lab results as part of my medical decision-making.   EKG Interpretation None     Consult[facility] consultation for admission. MDM   Final diagnoses:  Fall, initial encounter  Seizure [facility])  Confusion with non-focal neuro exam   Patient was brought to the emergency department with reported seizure and patient being out of his seizure medications. Patient is a poor historian for additional information. CT does not show intracranial injury. After observation, the patient still is either postictal or encephalopathic. Currently there does not appear to be a metabolic etiology. Patient will need to be admitted for observation. At this time I do not feel he is safe to be returned to home care. No family  members have come to the emergency department to give additional history or describe home care available to the patient. The patient's wife is reportedly debilitated as well and in need of home health care.    Charlesetta Shanks, MD 06/27/15 [phone]

## 2015-06-27 NOTE — ED Notes (Signed)
Patient transported to CT 

## 2015-06-27 NOTE — ED Notes (Signed)
Patient transported to X-ray 

## 2015-06-27 NOTE — ED Notes (Signed)
When asked assessment questions, Pt continually sts "nothin.  I got nothin."

## 2015-06-27 NOTE — H&P (Signed)
.  History and Physical    Miguel Hanson [id number] DOB: 02-01-1946 DOA: 06/27/2015  Referring MD/NP/PA: Dr Johnney Killian PCP: [facility]  Outpatient Specialists:Dr. Jaynee Eagles in [facility] for seizure  Patient coming from: Home, lives with wife.   Chief Complaint: AMS, Fall.   HPI: Miguel Hanson is a 70 y.o. male with medical history significant of HTN, polysubstance abuse, Stroke, hepatic encephalopathy, alcohol withdrawal, question of underlying Wernicke's encephalopathy/Korsakoff syndrome, PTSD, and seizure disorder,with some seizures allegedly being alcohol related,  no compliant with medications who presents with AMS.  Per report patient was found on the floor by a CNA. His wife heard a noise, she was not able to check on him because she is non ambulatory. Fall  happens around 8;30 am, but unknown last time of seeing normal. I spoke with patient ' wife,at baseline Miguel Hanson is able to have  A normal conversation, able to answer questions. His last alcohol drink was day prior to admission. Per wife he doesn't drink every day. He doesn't have a PCP here in [place]. Per wife every time patient has a seizure he usually gets very confuse after.   Patient denies chest pain, dyspnea, abdominal pain, nausea, vomiting. He is confuse, only able to answer yes or no questions.    ED Course: Patient received in the ED; ativan, IV keppra, Metoprolol. Normal electrolytes, Normal LFT, troponin 0.03, CT head no acute abnormalities, alcohol less than 5.   Review of Systems: As per HPI otherwise 10 point review of systems negative.    Past Medical History  Diagnosis Date  . Hypertension   . Seizures ([place])   . ETOH abuse   . Drug abuse   . PTSD (post-traumatic stress disorder)   . H/O prolonged Q-T interval on ECG   . Polysubstance abuse   . Alcohol abuse   . Depression     Past Surgical History  Procedure Laterality Date  . No past surgeries       reports that he has been smoking  Cigarettes.  He has a 24.5 pack-year smoking history. He uses smokeless tobacco. He reports that he drinks about 21.6 oz of alcohol per week. He reports that he uses illicit drugs (Marijuana).  No Known Allergies  Family History  Problem Relation Age of Onset  . Hypertension Mother   . Hypertension Father      Prior to Admission medications   Medication Sig Start Date End Date Taking? Authorizing Provider  levETIRAcetam (KEPPRA) 750 MG tablet Take 1 tablet (750 mg total) by mouth 2 (two) times daily. 05/12/15  Yes Sharlett Iles, MD  oxcarbazepine (TRILEPTAL) 600 MG tablet Take 1 tablet (600 mg total) by mouth 2 (two) times daily. 05/12/15  Yes Sharlett Iles, MD  aspirin 325 MG tablet Take 1 tablet (325 mg total) by mouth daily. 12/31/14   Delfina Redwood, MD  atorvastatin (LIPITOR) 40 MG tablet Take 1 tablet (40 mg total) by mouth daily at 6 PM. 12/31/14   Delfina Redwood, MD  folic acid (FOLVITE) 1 MG tablet Take 1 tablet (1 mg total) by mouth daily. Patient not taking: Reported on 12/31/2014 05/21/14   Florencia Reasons, MD  metoprolol tartrate (LOPRESSOR) 25 MG tablet Take 0.5 tablets (12.5 mg total) by mouth 2 (two) times daily. 05/21/14   Florencia Reasons, MD  traZODone (DESYREL) 50 MG tablet Take 1 tablet (50 mg total) by mouth at bedtime as needed for sleep. 07/11/14   Rushil Sherrye Payor, MD  Physical Exam: Filed Vitals:   06/27/15 1430 06/27/15 1500 06/27/15 1530 06/27/15 1600  BP: 153/95 178/108 152/105 169/99  Pulse: 73 69 66 65  Temp:      TempSrc:      Resp: 18 13 18 16   SpO2: 97% 100% 98% 99%      Constitutional: NAD, calm, comfortable Filed Vitals:   06/27/15 1430 06/27/15 1500 06/27/15 1530 06/27/15 1600  BP: 153/95 178/108 152/105 169/99  Pulse: 73 69 66 65  Temp:      TempSrc:      Resp: 18 13 18 16   SpO2: 97% 100% 98% 99%   Eyes: PERRL, lids and conjunctivae normal ENMT: Mucous membranes are moist. Posterior pharynx clear of any exudate or lesions.Normal  dentition.  Neck: normal, supple, no masses, no thyromegaly Respiratory: clear to auscultation bilaterally, no wheezing, no crackles. Normal respiratory effort. No accessory muscle use.  Cardiovascular: Regular rate and rhythm, no murmurs / rubs / gallops. No extremity edema. 2+ pedal pulses. No carotid bruits.  Abdomen: no tenderness, no masses palpated. No hepatosplenomegaly. Bowel sounds positive.  Musculoskeletal: no clubbing / cyanosis. No joint deformity upper and lower extremities. Good ROM, no contractures. Normal muscle tone.  Skin: no rashes, lesions, ulcers. No induration Neurologic: CN 2-12 grossly intact. Sensation intact, DTR normal. Strength 5/5 in all 4. Confuse, unable to  answer questions appropriately./    Labs on Admission: I have personally reviewed following labs and imaging studies  CBC:  Recent Labs Lab 06/27/15 1115  WBC 7.9  NEUTROABS 5.0  HGB 14.3  HCT 38.6*  MCV 96.5  PLT [id number]*   Basic Metabolic Panel:  Recent Labs Lab 06/27/15 1115  NA 141  K 4.8  CL 104  CO2 25  GLUCOSE 112*  BUN 13  CREATININE 0.74  CALCIUM 9.6   GFR: CrCl cannot be calculated (Unknown ideal weight.). Liver Function Tests:  Recent Labs Lab 06/27/15 1115  AST 31  ALT 15*  ALKPHOS 65  BILITOT 0.8  PROT 8.0  ALBUMIN 4.4   No results for input(s): LIPASE, AMYLASE in the last 168 hours. No results for input(s): AMMONIA in the last 168 hours. Coagulation Profile: No results for input(s): INR, PROTIME in the last 168 hours. Cardiac Enzymes:  Recent Labs Lab 06/27/15 1115  TROPONINI <0.03   BNP (last 3 results) No results for input(s): PROBNP in the last 8760 hours. HbA1C: No results for input(s): HGBA1C in the last 72 hours. CBG: No results for input(s): GLUCAP in the last 168 hours. Lipid Profile: No results for input(s): CHOL, HDL, LDLCALC, TRIG, CHOLHDL, LDLDIRECT in the last 72 hours. Thyroid Function Tests: No results for input(s): TSH, T4TOTAL,  FREET4, T3FREE, THYROIDAB in the last 72 hours. Anemia Panel: No results for input(s): VITAMINB12, FOLATE, FERRITIN, TIBC, IRON, RETICCTPCT in the last 72 hours. Urine analysis:    Component Value Date/Time   COLORURINE YELLOW 12/27/2014 2322   APPEARANCEUR CLOUDY* 12/27/2014 2322   LABSPEC 1.011 12/27/2014 2322   PHURINE 7.0 12/27/2014 2322   GLUCOSEU NEGATIVE 12/27/2014 2322   HGBUR NEGATIVE 12/27/2014 2322   BILIRUBINUR NEGATIVE 12/27/2014 2322   KETONESUR NEGATIVE 12/27/2014 2322   PROTEINUR 30* 12/27/2014 2322   UROBILINOGEN 1.0 12/27/2014 2322   NITRITE POSITIVE* 12/27/2014 2322   LEUKOCYTESUR NEGATIVE 12/27/2014 2322   Sepsis Labs: @LABRCNTIP (procalcitonin:4,lacticidven:4) )No results found for this or any previous visit (from the past 240 hour(s)).   Radiological Exams on Admission: Ct Head Wo Contrast  06/27/2015  CLINICAL DATA:  Fall.  Seizure.  Decreased left grip EXAM: CT HEAD WITHOUT CONTRAST TECHNIQUE: Contiguous axial images were obtained from the base of the skull through the vertex without intravenous contrast. COMPARISON:  CT 12/27/2014 FINDINGS: Moderate atrophy.  Negative for hydrocephalus. Chronic microvascular ischemic change throughout the white matter. Negative for acute infarct. Negative for acute hemorrhage or mass. No shift of the midline structures. Negative for skull fracture. Chronic bony thickening left sphenoid sinus related to chronic sinusitis. This is stable. Opacification left frontal sinus unchanged. IMPRESSION: Atrophy and chronic microvascular ischemia.  No acute abnormality. Electronically Signed   By: Franchot Gallo [name]   On: 06/27/2015 12:31    EKG: will order EKG   Assessment/Plan Active Problems:   Alcohol abuse   COPD (chronic obstructive pulmonary disease) (HCC)   Chronic diastolic heart failure, NYHA class 1 (HCC)   Acute encephalopathy   Seizure (HCC)   Thrombocytopenia (HCC)   1-Acute Encephalopathy -Patient was found confuse,  on the floor. He has not been taking his anti seizure medications for 1 week , ran out.  CT head no acute finding. UA negative for infection. UDS negative. Alcohol less than 5.  His confusion could be multifactorial post seizure, will get ammonia level to rule out hepatic  encephalopathy. Will also obtain MRI Brain to rule out stroke. Continue with aspirin, swallow evaluation.  CIWA protocol to prevent further withdrawal. Thiamine and folate to prevent wernicke's encephalopathy/  Resume Keppra and trileptal.   Work up for infection: Check Chest X ray.   2-Seizure disorder:  Resume Keppra IV 750 mg BID and trileptal.   3-Alcohol abuse: continue to drinks per wife, but less frequent.  CIWA ordered.  Thiamine and folate.   4-Thrombocytopenia.chronic. Suspect related to alcohol   5-Chronic diastolic HF : compensated.  Not on lasix.  Continue with metoprolol.   6-Fall, Questionable syncope /  I have order EKG. --EKG prolong PR, sinus rhythm.  Monitor on telemetry.  Cycle enzymes.   7-HTN; continue with metoprolol. PRN hydralazine for SBP more than 220/    DVT prophylaxis: SCD,no anticoagulation due to thrombocytopenia.  Code Status: presume full code.  Family Communication: care discussed with wife.  Disposition Plan: work up for confusion, needs more home support, PCP Consults called: none Admission status: observation, telemetry    Niel Hummer A MD [facility] Pager [phone]  If 7PM-7AM, please contact night-coverage [email or web address] Password [facility]  06/27/2015, 4:50 PM

## 2015-06-27 NOTE — Progress Notes (Signed)
[facility] consulted for pcp needs.  EDCM spoke to patient at bedside.  Patient confirms he is a patient at the [facility] medical center in [place].  Patient reports he would like to have a pcp outside the [place] closer to home.  [facility] provided patient with list of pcps who accept [facility] insurance within a 20 mile radius of patient's zip code [phone].  Patient also reports he is having difficulty getting his medications from the [place].  Patient reports his medications are usually mailed to him from the [place], but he hasn't gotten them in a while.  EDCM asked patient if he has notified his pcp at the [place] to place refills on his prescriptions so that they can be mailed to him again?  Patient reports he has not done this.  Patient has not had his seizure medications in quite some time, he couldn't say for how long.  Patient would benefit by having his prescriptions in hand prior to discharge from the hospital.  No further [facility] needs at this time.

## 2015-06-28 DIAGNOSIS — G9340 Encephalopathy, unspecified: Secondary | ICD-10-CM | POA: Diagnosis not present

## 2015-06-28 LAB — CBC
HCT: 37.3 % — ABNORMAL LOW (ref 39.0–52.0)
HEMOGLOBIN: 13.9 g/dL (ref 13.0–17.0)
MCH: 35.8 pg — AB (ref 26.0–34.0)
MCHC: 37.3 g/dL — ABNORMAL HIGH (ref 30.0–36.0)
MCV: 96.1 fL (ref 78.0–100.0)
PLATELETS: 128 10*3/uL — AB (ref 150–400)
RBC: 3.88 MIL/uL — AB (ref 4.22–5.81)
RDW: 12.9 % (ref 11.5–15.5)
WBC: 11 10*3/uL — AB (ref 4.0–10.5)

## 2015-06-28 LAB — BASIC METABOLIC PANEL
ANION GAP: 9 (ref 5–15)
BUN: 7 mg/dL (ref 6–20)
CALCIUM: 8.4 mg/dL — AB (ref 8.9–10.3)
CO2: 22 mmol/L (ref 22–32)
CREATININE: 0.6 mg/dL — AB (ref 0.61–1.24)
Chloride: 104 mmol/L (ref 101–111)
GFR calc non Af Amer: >60 mL/min (ref >60)
Glucose, Bld: 95 mg/dL (ref 65–99)
Potassium: 3.6 mmol/L (ref 3.5–5.1)
SODIUM: 135 mmol/L (ref 135–145)

## 2015-06-28 LAB — TROPONIN I: Troponin I: <0.03 ng/mL (ref <0.031)

## 2015-06-28 MED ORDER — LEVETIRACETAM 750 MG PO TABS
750.0000 mg | ORAL_TABLET | Freq: Two times a day (BID) | ORAL | Status: DC
  Administered 2015-06-28 – 2015-06-30 (×5): 750 mg via ORAL
  Filled 2015-06-28 (×7): qty 1

## 2015-06-28 MED ORDER — ENSURE ENLIVE PO LIQD
237.0000 mL | Freq: Three times a day (TID) | ORAL | Status: DC
  Administered 2015-06-28 – 2015-06-30 (×4): 237 mL via ORAL

## 2015-06-28 NOTE — Progress Notes (Signed)
PROGRESS NOTE    [name]  [id number] DOB: 08/06/1945 DOA: 06/27/2015 PCP: [facility]  Outpatient Specialists: Dr. Jaynee Eagles in [facility] for seizure     Brief Narrative: Miguel Hanson is a 70 y.o. male with medical history significant of HTN, polysubstance abuse, Stroke, hepatic encephalopathy, alcohol withdrawal, question of underlying Wernicke's encephalopathy/Korsakoff syndrome, PTSD, and seizure disorder,with some seizures allegedly being alcohol related, no compliant with medications who presents with AMS. Per report patient was found on the floor by a CNA. His wife heard a noise, she was not able to check on him because she is non ambulatory. Fall happens around 8;30 am, but unknown last time of seeing normal. I spoke with patient ' wife,at baseline Miguel Hanson is able to have A normal conversation, able to answer questions. His last alcohol drink was day prior to admission. Per wife he doesn't drink every day. He doesn't have a PCP here in [place]. Per wife every time patient has a seizure he usually gets very confuse after.   Assessment & Plan:   Active Problems:   Alcohol abuse   COPD (chronic obstructive pulmonary disease) (HCC)   Chronic diastolic heart failure, NYHA class 1 (HCC)   Encephalopathy acute   Acute encephalopathy   Seizure ([place])   Thrombocytopenia ([place])   Fall   1-Acute Encephalopathy related to seizure, improving.  -Patient was found confuse, on the floor. He has not been taking his anti seizure medications for 1 week , ran out.  CT head no acute finding. UA negative for infection. UDS negative. Alcohol less than 5.  Ammonia normal, MRI negative for stroke.  CIWA protocol to prevent further withdrawal.  Thiamine and folate to prevent wernicke's encephalopathy/  Continue with Keppra and trileptal.  Work up for infection: Chest x ray negative  2-Seizure disorder:  Change  Keppra 750 mg BID to PO and Continue with trileptal.    3-Alcohol abuse: continue to drinks per wife, but less frequent.  CIWA ordered.  Thiamine and folate.  No evidence of withdrawal  4-Thrombocytopenia.chronic. Suspect related to alcohol   5-Chronic diastolic HF : compensated.  Not on lasix.  Continue with metoprolol.   6-Fall, Questionable syncope /  I have order EKG. --EKG prolong PR, sinus rhythm.  Troponin negative  7-HTN; continue with metoprolol.     DVT prophylaxis: SCD Code Status: Full Code.  Family Communication: Unable to reach wife Disposition Plan: home in 24 hours. Needs PCP...    Consultants:   none  Procedures:   none  Antimicrobials:   none   Subjective: Patient alert , MS improved, at least now he is able to answer some questions. He was aware that he was at the hospital, was oriented to peson and situation.  He relates that he had a seizure yesterday   Objective: Filed Vitals:   06/27/15 1714 06/27/15 1748 06/27/15 2021 06/28/15 0518  BP:   148/91 148/90  Pulse:   65 71  Temp: 98.8 F (37.1 C)  98.4 F (36.9 C) 98.8 F (37.1 C)  TempSrc:   Oral Oral  Resp:   16 17  Height:  5\' 4"  (1.626 m)    Weight:  56 kg (123 lb 7.3 oz)    SpO2:   98% 99%    Intake/Output Summary (Last 24 hours) at 06/28/15 1245 Last data filed at 06/28/15 0600  Gross per 24 hour  Intake 1381.67 ml  Output    700 ml  Net 681.67 ml  Filed Weights   06/27/15 1748  Weight: 56 kg (123 lb 7.3 oz)    Examination:  General exam: Appears calm and comfortable  Respiratory system: Clear to auscultation. Respiratory effort normal. Cardiovascular system: S1 & S2 heard, RRR. No JVD, murmurs, rubs, gallops or clicks. No pedal edema. Gastrointestinal system: Abdomen is nondistended, soft and nontender. No organomegaly or masses felt. Normal bowel sounds heard. Central nervous system: Alert and oriented. No focal neurological deficits. Extremities: Symmetric 5 x 5 power. Skin: No rashes, lesions or ulcers .      Data Reviewed: I have personally reviewed following labs and imaging studies  CBC:  Recent Labs Lab 06/27/15 1115 06/28/15 0516  WBC 7.9 11.0*  NEUTROABS 5.0  --   HGB 14.3 13.9  HCT 38.6* 37.3*  MCV 96.5 96.1  PLT 134* 0000000*   Basic Metabolic Panel:  Recent Labs Lab 06/27/15 1115 06/28/15 0516  NA 141 135  K 4.8 3.6  CL 104 104  CO2 25 22  GLUCOSE 112* 95  BUN 13 7  CREATININE 0.74 0.60*  CALCIUM 9.6 8.4*   GFR: Estimated Creatinine Clearance: 68.1 mL/min (by C-G formula based on Cr of 0.6). Liver Function Tests:  Recent Labs Lab 06/27/15 1115  AST 31  ALT 15*  ALKPHOS 65  BILITOT 0.8  PROT 8.0  ALBUMIN 4.4   No results for input(s): LIPASE, AMYLASE in the last 168 hours.  Recent Labs Lab 06/27/15 1802  AMMONIA 17   Coagulation Profile: No results for input(s): INR, PROTIME in the last 168 hours. Cardiac Enzymes:  Recent Labs Lab 06/27/15 1115 06/27/15 1802 06/27/15 2315 06/28/15 0516  TROPONINI <0.03 <0.03 <0.03 <0.03   BNP (last 3 results) No results for input(s): PROBNP in the last 8760 hours. HbA1C: No results for input(s): HGBA1C in the last 72 hours. CBG: No results for input(s): GLUCAP in the last 168 hours. Lipid Profile: No results for input(s): CHOL, HDL, LDLCALC, TRIG, CHOLHDL, LDLDIRECT in the last 72 hours. Thyroid Function Tests: No results for input(s): TSH, T4TOTAL, FREET4, T3FREE, THYROIDAB in the last 72 hours. Anemia Panel: No results for input(s): VITAMINB12, FOLATE, FERRITIN, TIBC, IRON, RETICCTPCT in the last 72 hours. Urine analysis:    Component Value Date/Time   COLORURINE YELLOW 06/27/2015 [place] 06/27/2015 1629   LABSPEC 1.013 06/27/2015 1629   PHURINE 7.5 06/27/2015 1629   GLUCOSEU NEGATIVE 06/27/2015 1629   HGBUR NEGATIVE 06/27/2015 1629   BILIRUBINUR NEGATIVE 06/27/2015 1629   KETONESUR NEGATIVE 06/27/2015 1629   PROTEINUR NEGATIVE 06/27/2015 1629   UROBILINOGEN 1.0  12/27/2014 2322   NITRITE NEGATIVE 06/27/2015 1629   LEUKOCYTESUR NEGATIVE 06/27/2015 1629   Sepsis Labs: @LABRCNTIP (procalcitonin:4,lacticidven:4)  )No results found for this or any previous visit (from the past 240 hour(s)).       Radiology Studies: Dg Chest 2 View  06/27/2015  CLINICAL DATA:  Pt had seizure today. Pt having confusion. Pt unable to give history. Hx of HTN. EXAM: CHEST  2 VIEW COMPARISON:  12/27/2014 FINDINGS: Cardiac silhouette is normal in size and configuration. No mediastinal or hilar masses or convincing adenopathy. Lungs are clear.  No pleural effusion or pneumothorax. Bony thorax is demineralized. There are multiple wedge-shaped compression fractures along the mid and lower thoracic spine. IMPRESSION: No acute cardiopulmonary disease. Electronically Signed   By: Lajean Manes [name]   On: 06/27/2015 17:20   Ct Head Wo Contrast  06/27/2015  CLINICAL DATA:  Fall.  Seizure.  Decreased left grip  EXAM: CT HEAD WITHOUT CONTRAST TECHNIQUE: Contiguous axial images were obtained from the base of the skull through the vertex without intravenous contrast. COMPARISON:  CT 12/27/2014 FINDINGS: Moderate atrophy.  Negative for hydrocephalus. Chronic microvascular ischemic change throughout the white matter. Negative for acute infarct. Negative for acute hemorrhage or mass. No shift of the midline structures. Negative for skull fracture. Chronic bony thickening left sphenoid sinus related to chronic sinusitis. This is stable. Opacification left frontal sinus unchanged. IMPRESSION: Atrophy and chronic microvascular ischemia.  No acute abnormality. Electronically Signed   By: Franchot Gallo [name]   On: 06/27/2015 12:31   Miguel Brain Wo Contrast  06/27/2015  CLINICAL DATA:  70 year old hypertensive male with history of polysubstance abuse. Seizure disorder. Off of seizure medication for the past 1-2 weeks. Subsequent encounter. EXAM: MRI HEAD WITHOUT CONTRAST TECHNIQUE: Multiplanar, multiecho  pulse sequences of the brain and surrounding structures were obtained without intravenous contrast. COMPARISON:  06/27/2015 CT.  12/28/2014 Miguel. FINDINGS: No acute infarct or intracranial hemorrhage. Moderate to marked global atrophy. Moderate chronic microvascular changes. No intracranial mass lesion noted on this unenhanced exam. No Miguel evidence of Wernicke's encephalopathy. Small right vertebral artery may predominantly end in a posterior inferior cerebellar artery distribution and is without change. Ectatic patent right vertebral artery and basilar artery. Internal carotid arteries are patent bilaterally. Chronic persistent opacification of the left frontal sinus. Small air-fluid level left maxillary sinus. Mucosal thickening inferior right maxillary sinus. Mild mucosal thickening ethmoid sinus air cells bilaterally. Cervical medullary junction unremarkable. No acute orbital abnormality. IMPRESSION: No acute infarct or intracranial hemorrhage. Moderate to marked global atrophy. Moderate chronic microvascular changes. No intracranial mass lesion noted on this unenhanced exam. No Miguel evidence of Wernicke's encephalopathy. Chronic persistent opacification of the left frontal sinus. Small air-fluid level left maxillary sinus. Mucosal thickening inferior right maxillary sinus. Mild mucosal thickening ethmoid sinus air cells bilaterally. Electronically Signed   By: Genia Del [name]   On: 06/27/2015 20:14        Scheduled Meds: . aspirin EC  81 mg Oral Daily  . atorvastatin  40 mg Oral q1800  . docusate sodium  100 mg Oral BID  . feeding supplement (ENSURE ENLIVE)  237 mL Oral TID BM  . folic acid  1 mg Oral Daily  . levETIRAcetam  750 mg Oral BID  . metoprolol tartrate  12.5 mg Oral BID  . multivitamin with minerals  1 tablet Oral Daily  . oxcarbazepine  600 mg Oral BID  . sodium chloride flush  3 mL Intravenous Q12H  . thiamine  100 mg Oral Daily   Or  . thiamine  100 mg Intravenous Daily    Continuous Infusions: . sodium chloride 50 mL/hr at 06/28/15 0513        Time spent: 35 minutes    Elmarie Shiley, MD [facility] Pager [phone]  If 7PM-7AM, please contact night-coverage [email or web address] Password [facility] 06/28/2015, 12:45 PM

## 2015-06-28 NOTE — Progress Notes (Signed)
Initial Nutrition Assessment  DOCUMENTATION CODES:   Severe malnutrition in context of chronic illness  INTERVENTION:  -Ensure Enlive TID. Each supplement provides 350 kcals and 20 grams of protein. -Continue to monitor for nutritional needs.   NUTRITION DIAGNOSIS:   Inadequate oral intake related to lethargy/confusion, poor appetite as evidenced by per patient/family report.  GOAL:   Patient will meet greater than or equal to 90% of their needs  MONITOR:   PO intake, Skin, Supplement acceptance, I & O's, Labs, Weight trends  REASON FOR ASSESSMENT:   Malnutrition Screening Tool    ASSESSMENT:   70 y.o. male with medical history significant of HTN, polysubstance abuse, Stroke, hepatic encephalopathy, alcohol withdrawal, question of underlying Wernicke's encephalopathy/Korsakoff syndrome, PTSD, and seizure disorder,with some seizures allegedly being alcohol related, no compliant with medications who presents with AMS.  Pt seen for MST. Pt confused and did not give appropriate answers. When asked if he had N/V he answered with "I eat one time a day" and "Yes, I eat oatmeal". Not able to obtain accurate dietary recall or weight hx from pt. Pt states he eats once/day. Per chart, pt weight has fluctuated from 116 lbs-127 lbs since 05/2014. Pt currently weighs 123 lbs which is a 6% weight gain since 12/28/2014. This is not significant for time frame. Pt meets the criteria for severe malnutrition. Will order Ensure TID to provide supplemental kcals and protein since intake may be poor.   NFPE: Severe muscle depletion, severe fat depletion, no edema.  Labs reviewed; creatinine 0.6 mg/dl, calcium 8.4 mg/dl.  Meds reviewed; Colace 123XX123 mg, Folic acid 1 mg, Thiamine B1 100 mg, MVI w/ minerals,   Diet Order:  Diet Heart Room service appropriate?: Yes with Assist; Fluid consistency:: Thin  Skin:  Reviewed, no issues  Last BM:  unknown  Height:   Ht Readings from Last 1 Encounters:   06/27/15 5\' 4"  (1.626 m)    Weight:   Wt Readings from Last 1 Encounters:  06/27/15 123 lb 7.3 oz (56 kg)    Ideal Body Weight:  54.5 kg  BMI:  Body mass index is 21.18 kg/(m^2).  Estimated Nutritional Needs:   Kcal:  1600-1800 (29-32 kcal/kg)  Protein:  80-90 g (1.5 g/kg)  Fluid:  1.6-1.8 L  EDUCATION NEEDS:   No education needs identified at this time  Geoffery Lyons, [place] Dietetic Intern Pager [phone]

## 2015-06-28 NOTE — Care Management Obs Status (Signed)
[place] NOTIFICATION   Patient Details  Name: Miguel Hanson MRN: [id number] Date of Birth: 10-13-1945   Medicare Observation Status Notification Given:  Yes    Purcell Mouton, RN 06/28/2015, 3:31 PM

## 2015-06-28 NOTE — Evaluation (Signed)
Physical Therapy Evaluation Patient Details Name: Miguel Hanson MRN: [id number] DOB: [birth year] Today's Date: 06/28/2015   History of Present Illness  Miguel Hanson is a 70 y.o. male with medical history significant of HTN, polysubstance abuse, Stroke, hepatic encephalopathy, alcohol withdrawal, question of underlying Wernicke's encephalopathy/Korsakoff syndrome, PTSD, and seizure disorder,with some seizures allegedly being alcohol related, non compliant with medications who presents with AMS.  Clinical Impression  Pt admitted with above diagnosis. Pt currently with functional limitations due to the deficits listed below (see PT Problem List). * Pt will benefit from skilled PT to increase their independence and safety with mobility to allow discharge to the venue listed below.  If pt continues to progress and cognition clears to baseline pt should be able to D/C home with HHPT     Follow Up Recommendations Home health PT;Supervision - Intermittent (if cognition continues to clear or pt at baseline)    Equipment Recommendations  None recommended by PT    Recommendations for Other Services       Precautions / Restrictions Precautions Precautions: Fall      Mobility  Bed Mobility Overal bed mobility: Needs Assistance Bed Mobility: Supine to Sit     Supine to sit: Min guard;Min assist     General bed mobility comments: requires light min assist to bring LEs over EOB, incr time, delayed processing--requiring multi-modal cues to comeplete task; pt perseverating on scooting to [facility]  Transfers Overall transfer level: Needs assistance Equipment used: Rolling walker (2 wheeled) Transfers: Sit to/from Stand Sit to Stand: Min assist         General transfer comment: assist to rise, stabilize in standing, cues for overall safety   Ambulation/Gait Ambulation/Gait assistance: Min assist;Min guard Ambulation Distance (Feet): 200 Feet Assistive device: Rolling walker (2 wheeled);None  (IV pole) Gait Pattern/deviations: Step-through pattern;Decreased stride length;Drifts right/left     General Gait Details: min/guard for amb 100' with RW, cues for obstacle negotiation; 100' with no AD/IV pole and min assist for balance  Stairs            Wheelchair Mobility    Modified Rankin (Stroke Patients Only)       Balance                                             Pertinent Vitals/Pain Pain Assessment: No/denies pain    Home Living Family/patient expects to be discharged to:: Private residence Living Arrangements: Spouse/significant other   Type of Home: House Home Access: Ramped entrance     Home Layout: One level Home Equipment: Environmental consultant - 2 wheels;Cane - single point      Prior Function Level of Independence: Independent;Independent with assistive device(s)         Comments: amb with cane or no AD; pt reports his wife is non-amb d/t her MS and that she has an aide that assists her with bathing, transfers, etc; pt reports he does all the meal prep, errands, grocery shopping, etc. (rides the bus, does not drive)     Hand Dominance        Extremity/Trunk Assessment   Upper Extremity Assessment: Overall WFL for tasks assessed           Lower Extremity Assessment: Overall WFL for tasks assessed         Communication   Communication: No difficulties  Cognition Arousal/Alertness: Awake/alert Behavior During Therapy:  WFL for tasks assessed/performed Overall Cognitive Status: Impaired/Different from baseline Area of Impairment: Safety/judgement       Following Commands: Follows one step commands with increased time Safety/Judgement: Decreased awareness of deficits   Problem Solving: Slow processing;Decreased initiation;Difficulty sequencing;Requires verbal cues;Requires tactile cues      General Comments      Exercises        Assessment/Plan    PT Assessment Patient needs continued PT services  PT  Diagnosis Difficulty walking   PT Problem List Decreased strength;Decreased range of motion;Decreased balance;Decreased mobility;Decreased activity tolerance  PT Treatment Interventions DME instruction;Gait training;Functional mobility training;Therapeutic activities;Therapeutic exercise;Patient/family education   PT Goals (Current goals can be found in the Care Plan section) Acute Rehab PT Goals Patient Stated Goal: home PT Goal Formulation: With patient Time For Goal Achievement: 07/05/15 Potential to Achieve Goals: Good    Frequency Min 3X/week   Barriers to discharge        Co-evaluation               End of Session Equipment Utilized During Treatment: Gait belt Activity Tolerance: Patient tolerated treatment well Patient left: in chair;with call bell/phone within reach;with chair alarm set      Functional Assessment Tool Used: clinical judgement Functional Limitation: Mobility: Walking and moving around Mobility: Walking and Moving Around Current Status [phone]): At least 1 percent but less than 20 percent impaired, limited or restricted Mobility: Walking and Moving Around Goal Status [phone]): At least 1 percent but less than 20 percent impaired, limited or restricted    Time: 0951-1012 PT Time Calculation (min) (ACUTE ONLY): 21 min   Charges:   PT Evaluation $PT Eval Moderate Complexity: 1 Procedure     PT G Codes:   PT G-Codes **NOT FOR INPATIENT CLASS** Functional Assessment Tool Used: clinical judgement Functional Limitation: Mobility: Walking and moving around Mobility: Walking and Moving Around Current Status [id number]): At least 1 percent but less than 20 percent impaired, limited or restricted Mobility: Walking and Moving Around Goal Status [phone]): At least 1 percent but less than 20 percent impaired, limited or restricted    [facility] 06/28/2015, 11:43 AM

## 2015-06-29 DIAGNOSIS — R569 Unspecified convulsions: Secondary | ICD-10-CM

## 2015-06-29 DIAGNOSIS — G9340 Encephalopathy, unspecified: Secondary | ICD-10-CM | POA: Diagnosis not present

## 2015-06-29 DIAGNOSIS — I5032 Chronic diastolic (congestive) heart failure: Secondary | ICD-10-CM | POA: Diagnosis not present

## 2015-06-29 LAB — BASIC METABOLIC PANEL
ANION GAP: 10 (ref 5–15)
BUN: 10 mg/dL (ref 6–20)
CHLORIDE: 106 mmol/L (ref 101–111)
CO2: 25 mmol/L (ref 22–32)
Calcium: 9.1 mg/dL (ref 8.9–10.3)
Creatinine, Ser: 0.6 mg/dL — ABNORMAL LOW (ref 0.61–1.24)
GFR calc Af Amer: >60 mL/min (ref >60)
GFR calc non Af Amer: >60 mL/min (ref >60)
GLUCOSE: 128 mg/dL — AB (ref 65–99)
POTASSIUM: 3.5 mmol/L (ref 3.5–5.1)
Sodium: 141 mmol/L (ref 135–145)

## 2015-06-29 LAB — CBC
HEMATOCRIT: 37.8 % — AB (ref 39.0–52.0)
HEMOGLOBIN: 14.1 g/dL (ref 13.0–17.0)
MCH: 36.3 pg — ABNORMAL HIGH (ref 26.0–34.0)
MCHC: 37.8 g/dL — ABNORMAL HIGH (ref 30.0–36.0)
MCV: 95.9 fL (ref 78.0–100.0)
Platelets: 129 10*3/uL — ABNORMAL LOW (ref 150–400)
RBC: 3.94 MIL/uL — ABNORMAL LOW (ref 4.22–5.81)
RDW: 12.7 % (ref 11.5–15.5)
WBC: 9 10*3/uL (ref 4.0–10.5)

## 2015-06-29 NOTE — Consult Note (Signed)
Reason for Consult: Seizure Referring Physician: Dr. Frederic Jericho  CC: Seizure  HPI: Miguel Hanson is an 70 y.o. male with a history of hypertension, polysubstance abuse, ongoing tobacco use, previous stroke, hepatic encephalopathy, alcohol withdrawal, PTSD, and seizure disorder possibly related to alcohol use, admitted to [facility] 06/27/2015 after the patient was found on the floor at his home by his wife who is debilitated with MS. According to the wife's report the patient's seizures are usually followed by confusion.   Prior to admission the patient was on Keppra 750 mg twice daily and Trileptal 600 mg twice daily. Both of these medications were continued at the time of admission. A CT of the head on 06/27/2015 showed no acute findings. An MRI on the same date also showed no acute findings. The patient's last alcohol was reportedly the day prior to admission. He reports he has been drinking " a little " lately but not as much as he used to drink. The patient states that this event was similar to his seizures in the past. Apparently it was unwitnessed. He just remembers waking up on the floor confused. He recently ran out of his Trileptal and was unable to get a refill. In looking through the records it appears that the patient has been a no-show for his neurology follow-up visits with Dr Jaynee Eagles. He is also [facility] the [facility].  Past Medical History  Diagnosis Date  . Hypertension   . Seizures ([place])   . ETOH abuse   . Drug abuse   . PTSD (post-traumatic stress disorder)   . H/O prolonged Q-T interval on ECG   . Polysubstance abuse   . Alcohol abuse   . Depression     Past Surgical History  Procedure Laterality Date  . No past surgeries      Family History  Problem Relation Age of Onset  . Hypertension Mother   . Hypertension Father     Social History:  reports that he has been smoking Cigarettes.  He has a 24.5 pack-year smoking history. He uses smokeless tobacco. He  reports that he drinks about 21.6 oz of alcohol per week. He reports that he uses illicit drugs (Marijuana).  No Known Allergies  Medications:  Scheduled: . aspirin EC  81 mg Oral Daily  . atorvastatin  40 mg Oral q1800  . docusate sodium  100 mg Oral BID  . feeding supplement (ENSURE ENLIVE)  237 mL Oral TID BM  . folic acid  1 mg Oral Daily  . levETIRAcetam  750 mg Oral BID  . metoprolol tartrate  12.5 mg Oral BID  . multivitamin with minerals  1 tablet Oral Daily  . oxcarbazepine  600 mg Oral BID  . sodium chloride flush  3 mL Intravenous Q12H  . thiamine  100 mg Oral Daily   Or  . thiamine  100 mg Intravenous Daily    ROS: History obtained from the patient  General ROS: negative for - chills, fatigue, fever, night sweats, weight gain or weight loss Psychological ROS: negative for - behavioral disorder, hallucinations, memory difficulties, mood swings or suicidal ideation Ophthalmic ROS: negative for - blurry vision, double vision, eye pain or loss of vision ENT ROS: negative for - epistaxis, nasal discharge, oral lesions, sore throat, tinnitus or vertigo Allergy and Immunology ROS: negative for - hives or itchy/watery eyes Hematological and Lymphatic ROS: negative for - bleeding problems, bruising or swollen lymph nodes Endocrine ROS: negative for - galactorrhea, hair pattern changes,  polydipsia/polyuria or temperature intolerance Respiratory ROS: negative for - cough, hemoptysis, shortness of breath or wheezing Cardiovascular ROS: negative for - chest pain, dyspnea on exertion, edema or irregular heartbeat Gastrointestinal ROS: negative for - abdominal pain, diarrhea, hematemesis, nausea/vomiting or stool incontinence Genito-Urinary ROS: negative for - dysuria, hematuria, incontinence or urinary frequency/urgency Musculoskeletal ROS: negative for - joint swelling or muscular weakness Neurological ROS: as noted in HPI Dermatological ROS: negative for rash and skin lesion  changes   Physical Examination: Blood pressure 128/75, pulse 69, temperature 98.7 F (37.1 C), temperature source Oral, resp. rate 16, height 5\' 4"  (1.626 m), weight 57.2 kg (126 lb 1.7 oz), SpO2 97 %.  General - alert 70 year old male hard of hearing but in no acute distress. Heart - Regular rate and rhythm - somewhat distant heart sounds Lungs - Clear to auscultation - poor inspiratory effort. Abdomen - Soft - non tender Extremities - Distal pulses intact in the right lower extremity. Weak to absent in the left lower extremity. Both extremities are warm to the touch. Skin - Warm and dry   Neurologic Examination Mental Status: Alert, oriented, thought content appropriate.  Speech fluent without evidence of aphasia.  The patient had significant difficulty following commands. It was unclear that this was due to his hearing deficits. Cranial Nerves: II: Discs not visualized; Visual fields grossly normal, pupils equal, round, reactive to light and accommodation III,IV, VI: ptosis not present, extra-ocular motions intact bilaterally V,VII: smile symmetric, facial light touch sensation normal bilaterally VIII: hearing normal bilaterally IX,X: gag reflex present XI: bilateral shoulder shrug XII: midline tongue extension Motor: Right : Upper extremity   5/5    Left:     Upper extremity   5/5  Lower extremity   5/5     Lower extremity   5/5 Tone and bulk:normal tone throughout; no atrophy noted Sensory: Light touch intact throughout, bilaterally Deep Tendon Reflexes: 2+ and symmetric throughout Plantars: Right: downgoing   Left: downgoing Cerebellar: Significant difficulty following instructions for finger to nose testing. Eventually is able to perform with tremor bilaterally. Gait: Deferred   Laboratory Studies:   Basic Metabolic Panel:  Recent Labs Lab 06/27/15 1115 06/28/15 0516 06/29/15 1120  NA 141 135 141  K 4.8 3.6 3.5  CL 104 104 106  CO2 25 22 25   GLUCOSE 112* 95  128*  BUN 13 7 10   CREATININE 0.74 0.60* 0.60*  CALCIUM 9.6 8.4* 9.1    Liver Function Tests:  Recent Labs Lab 06/27/15 1115  AST 31  ALT 15*  ALKPHOS 65  BILITOT 0.8  PROT 8.0  ALBUMIN 4.4   No results for input(s): LIPASE, AMYLASE in the last 168 hours.  Recent Labs Lab 06/27/15 1802  AMMONIA 17    CBC:  Recent Labs Lab 06/27/15 1115 06/28/15 0516 06/29/15 1120  WBC 7.9 11.0* 9.0  NEUTROABS 5.0  --   --   HGB 14.3 13.9 14.1  HCT 38.6* 37.3* 37.8*  MCV 96.5 96.1 95.9  PLT 134* 128* 129*    Cardiac Enzymes:  Recent Labs Lab 06/27/15 1115 06/27/15 1802 06/27/15 2315 06/28/15 0516  TROPONINI <0.03 <0.03 <0.03 <0.03    BNP: Invalid input(s): POCBNP  CBG: No results for input(s): GLUCAP in the last 168 hours.  Microbiology: Results for orders placed or performed during the hospital encounter of 12/27/14  Culture, blood (routine x 2)     Status: None   Collection Time: 12/28/14  6:30 AM  Result Value Ref Range Status  Specimen Description BLOOD LEFT ANTECUBITAL  Final   Special Requests BOTTLES DRAWN AEROBIC AND ANAEROBIC 10CC  Final   Culture NO GROWTH 5 DAYS  Final   Report Status 01/02/2015 FINAL  Final  Culture, blood (routine x 2)     Status: None   Collection Time: 12/28/14  6:45 AM  Result Value Ref Range Status   Specimen Description BLOOD RIGHT ANTECUBITAL  Final   Special Requests BOTTLES DRAWN AEROBIC AND ANAEROBIC 10CC  Final   Culture NO GROWTH 5 DAYS  Final   Report Status 01/02/2015 FINAL  Final  Culture, Urine     Status: None   Collection Time: 12/30/14  9:25 PM  Result Value Ref Range Status   Specimen Description URINE, CLEAN CATCH  Final   Special Requests NONE  Final   Culture 1,000 COLONIES/mL INSIGNIFICANT GROWTH  Final   Report Status 12/31/2014 FINAL  Final    Coagulation Studies: No results for input(s): LABPROT, INR in the last 72 hours.  Urinalysis:  Recent Labs Lab 06/27/15 1629  COLORURINE YELLOW   LABSPEC 1.013  PHURINE 7.5  GLUCOSEU NEGATIVE  HGBUR NEGATIVE  BILIRUBINUR NEGATIVE  KETONESUR NEGATIVE  PROTEINUR NEGATIVE  NITRITE NEGATIVE  LEUKOCYTESUR NEGATIVE    Lipid Panel:     Component Value Date/Time   CHOL 160 12/29/2014 0521   CHOL 216* 08/07/2014 1141   TRIG 86 12/29/2014 0521   HDL 61 12/29/2014 0521   HDL 74 08/07/2014 1141   CHOLHDL 2.6 12/29/2014 0521   CHOLHDL 2.9 08/07/2014 1141   VLDL 17 12/29/2014 0521   LDLCALC 82 12/29/2014 0521   LDLCALC 124* 08/07/2014 1141    HgbA1C:  Lab Results  Component Value Date   HGBA1C 4.9 12/28/2014    Urine Drug Screen:     Component Value Date/Time   LABOPIA NONE DETECTED 06/27/2015 1629   LABOPIA NEGATIVE 12/11/2012 1207   COCAINSCRNUR NONE DETECTED 06/27/2015 1629   COCAINSCRNUR NEGATIVE 12/11/2012 1207   LABBENZ NONE DETECTED 06/27/2015 1629   LABBENZ POSITIVE* 12/11/2012 1207   AMPHETMU NONE DETECTED 06/27/2015 1629   AMPHETMU NEGATIVE 12/11/2012 1207   THCU NONE DETECTED 06/27/2015 1629   LABBARB NONE DETECTED 06/27/2015 1629    Alcohol Level:  Recent Labs Lab 06/27/15 [place] <5    Other results: EKG: Sinus rhythm rate 63 bpm. Please refer to the formal cardiology reading for complete details.  Imaging:  Dg Chest 2 View 06/27/2015   No acute cardiopulmonary disease.    [name] Contrast 06/27/2015   No acute infarct or intracranial hemorrhage. Moderate to marked global atrophy. Moderate chronic microvascular changes. No intracranial mass lesion noted on this unenhanced exam. No MR evidence of Wernicke's encephalopathy. Chronic persistent opacification of the left frontal sinus. Small air-fluid level left maxillary sinus. Mucosal thickening inferior right maxillary sinus. Mild mucosal thickening ethmoid sinus air cells bilaterally.     Assessment/Plan:  70 year old male with a history of polysubstance abuse including alcohol admitted after he was found on the floor of his home -  probably postictal following a seizure. The patient had recently run out of his Trileptal and was unable to get a refill. He has been noncompliant with his follow-up visits to his neurologist, Dr Jaynee Eagles.  He is now back on his Keppra and Trileptal. He is also followed at the [facility].   Further recommendations to follow per Dr. Anthony Sar Rinehuls PA-C [facility] Pager [phone] 06/29/2015, 3:54 PM  He is a very pleasant gentleman who is oriented to place, situation, month, year.  70 year old male with breakthrough seizures in the setting of noncompliance. He is currently back to baseline. I would continue his home medications, and advise him to not run out in the future.  No further recommendations at this time, please call with any further questions or concerns.   Roland Rack, MD [facility] [phone]  If 7pm- 7am, please page neurology on call as listed in [place].

## 2015-06-29 NOTE — Progress Notes (Addendum)
PROGRESS NOTE    Miguel Hanson  [id number] DOB: May 08, 1945 DOA: 06/27/2015 PCP: [place]  Outpatient Specialists: Dr. Jaynee Eagles in [facility] for seizure     Brief Narrative: Miguel Hanson is a 70 y.o. male with medical history significant of HTN, polysubstance abuse, Stroke, hepatic encephalopathy, alcohol withdrawal, question of underlying Wernicke's encephalopathy/Korsakoff syndrome, PTSD, and seizure disorder,with some seizures allegedly being alcohol related, no compliant with medications who presents with AMS. Per report patient was found on the floor by a CNA. His wife heard a noise, she was not able to check on him because she is non ambulatory. Fall happens around 8;30 am, but unknown last time of seeing normal. I spoke with patient ' wife,at baseline Miguel Hanson is able to have A normal conversation, able to answer questions. His last alcohol drink was day prior to admission. Per wife he doesn't drink every day. He doesn't have a PCP here in [place]. Per wife every time patient has a seizure he usually gets very confuse after.   Assessment & Plan:   Active Problems:   Alcohol abuse   COPD (chronic obstructive pulmonary disease) (HCC)   Chronic diastolic heart failure, NYHA class 1 (HCC)   Encephalopathy acute   Acute encephalopathy   Seizure ([place])   Thrombocytopenia ([place])   Fall   1-Acute Encephalopathy related to seizure, improving.  -Patient was found confuse, on the floor. He has not been taking his anti seizure medications for 1 week , ran out.  CT head no acute finding. UA negative for infection. UDS negative. Alcohol less than 5.  Ammonia normal, MRI negative for stroke.  CIWA protocol to prevent further withdrawal.  Thiamine and folate to prevent wernicke's encephalopathy/  Continue with Keppra and trileptal.  Work up for infection: Chest x ray negative -patient not at baseline, I have consulted neurology.  -Patient will need prescriptions for  Keppra, metoprolol, trileptal at discharge/   2-Seizure disorder:  Continue with   Keppra 750 mg BID to PO and Continue with trileptal.   3-Alcohol abuse: continue to drinks per wife, but less frequent.  CIWA ordered.  Thiamine and folate.  CIWA score 1.   4-Thrombocytopenia.chronic. Suspect related to alcohol   5-Chronic diastolic HF : compensated.  Not on lasix.  Continue with metoprolol.   6-Fall, Questionable syncope /  I have order EKG. --EKG prolong PR, sinus rhythm.  Troponin negative  7-HTN; continue with metoprolol.     DVT prophylaxis: SCD Code Status: Full Code.  Family Communication: Unable to reach wife Disposition Plan: home in 24 hours. Needs PCP...    Consultants:   none  Procedures:   none  Antimicrobials:   none   Subjective: Patient was alert, able to tell me he is at the hospital. He was able to tell me that he has 3 children. He was not able to tell me where do they lives.  Per wife Patient is still repeating him self. Saying yes too frequent and to everything. Wife relates this is not patient baseline.   Objective: Filed Vitals:   06/28/15 0518 06/28/15 1412 06/28/15 2018 06/29/15 0604  BP: 148/90 155/95 154/95 154/94  Pulse: 71 66 76 71  Temp: 98.8 F (37.1 C) 98.3 F (36.8 C) 98.7 F (37.1 C) 98.7 F (37.1 C)  TempSrc: Oral Oral Oral Oral  Resp: 17 18 18 18   Height:      Weight:    57.2 kg (126 lb 1.7 oz)  SpO2: 99% 100% 100%  99%    Intake/Output Summary (Last 24 hours) at 06/29/15 1102 Last data filed at 06/29/15 0844  Gross per 24 hour  Intake   1970 ml  Output   2100 ml  Net   -130 ml   Filed Weights   06/27/15 1748 06/29/15 0604  Weight: 56 kg (123 lb 7.3 oz) 57.2 kg (126 lb 1.7 oz)    Examination:  General exam: Appears calm and comfortable  Respiratory system: Clear to auscultation. Respiratory effort normal. Cardiovascular system: S1 & S2 heard, RRR. No JVD, murmurs, rubs, gallops or clicks. No pedal  edema. Gastrointestinal system: Abdomen is nondistended, soft and nontender. No organomegaly or masses felt. Normal bowel sounds heard. Central nervous system: Alert .  No focal neurological deficits. Extremities: Symmetric 5 x 5 power. Skin: No rashes, lesions or ulcers .     Data Reviewed: I have personally reviewed following labs and imaging studies  CBC:  Recent Labs Lab 06/27/15 1115 06/28/15 0516  WBC 7.9 11.0*  NEUTROABS 5.0  --   HGB 14.3 13.9  HCT 38.6* 37.3*  MCV 96.5 96.1  PLT 134* 0000000*   Basic Metabolic Panel:  Recent Labs Lab 06/27/15 1115 06/28/15 0516  NA 141 135  K 4.8 3.6  CL 104 104  CO2 25 22  GLUCOSE 112* 95  BUN 13 7  CREATININE 0.74 0.60*  CALCIUM 9.6 8.4*   GFR: Estimated Creatinine Clearance: 69.5 mL/min (by C-G formula based on Cr of 0.6). Liver Function Tests:  Recent Labs Lab 06/27/15 1115  AST 31  ALT 15*  ALKPHOS 65  BILITOT 0.8  PROT 8.0  ALBUMIN 4.4   No results for input(s): LIPASE, AMYLASE in the last 168 hours.  Recent Labs Lab 06/27/15 1802  AMMONIA 17   Coagulation Profile: No results for input(s): INR, PROTIME in the last 168 hours. Cardiac Enzymes:  Recent Labs Lab 06/27/15 1115 06/27/15 1802 06/27/15 2315 06/28/15 0516  TROPONINI <0.03 <0.03 <0.03 <0.03   BNP (last 3 results) No results for input(s): PROBNP in the last 8760 hours. HbA1C: No results for input(s): HGBA1C in the last 72 hours. CBG: No results for input(s): GLUCAP in the last 168 hours. Lipid Profile: No results for input(s): CHOL, HDL, LDLCALC, TRIG, CHOLHDL, LDLDIRECT in the last 72 hours. Thyroid Function Tests: No results for input(s): TSH, T4TOTAL, FREET4, T3FREE, THYROIDAB in the last 72 hours. Anemia Panel: No results for input(s): VITAMINB12, FOLATE, FERRITIN, TIBC, IRON, RETICCTPCT in the last 72 hours. Urine analysis:    Component Value Date/Time   COLORURINE YELLOW 06/27/2015 [place] 06/27/2015 1629     LABSPEC 1.013 06/27/2015 1629   PHURINE 7.5 06/27/2015 1629   GLUCOSEU NEGATIVE 06/27/2015 1629   HGBUR NEGATIVE 06/27/2015 1629   BILIRUBINUR NEGATIVE 06/27/2015 1629   KETONESUR NEGATIVE 06/27/2015 1629   PROTEINUR NEGATIVE 06/27/2015 1629   UROBILINOGEN 1.0 12/27/2014 2322   NITRITE NEGATIVE 06/27/2015 1629   LEUKOCYTESUR NEGATIVE 06/27/2015 1629   Sepsis Labs: @LABRCNTIP (procalcitonin:4,lacticidven:4)  )No results found for this or any previous visit (from the past 240 hour(s)).       Radiology Studies: Dg Chest 2 View  06/27/2015  CLINICAL DATA:  Pt had seizure today. Pt having confusion. Pt unable to give history. Hx of HTN. EXAM: CHEST  2 VIEW COMPARISON:  12/27/2014 FINDINGS: Cardiac silhouette is normal in size and configuration. No mediastinal or hilar masses or convincing adenopathy. Lungs are clear.  No pleural effusion or pneumothorax. Bony thorax is  demineralized. There are multiple wedge-shaped compression fractures along the mid and lower thoracic spine. IMPRESSION: No acute cardiopulmonary disease. Electronically Signed   By: Lajean Manes [name]   On: 06/27/2015 17:20   Ct Head Wo Contrast  06/27/2015  CLINICAL DATA:  Fall.  Seizure.  Decreased left grip EXAM: CT HEAD WITHOUT CONTRAST TECHNIQUE: Contiguous axial images were obtained from the base of the skull through the vertex without intravenous contrast. COMPARISON:  CT 12/27/2014 FINDINGS: Moderate atrophy.  Negative for hydrocephalus. Chronic microvascular ischemic change throughout the white matter. Negative for acute infarct. Negative for acute hemorrhage or mass. No shift of the midline structures. Negative for skull fracture. Chronic bony thickening left sphenoid sinus related to chronic sinusitis. This is stable. Opacification left frontal sinus unchanged. IMPRESSION: Atrophy and chronic microvascular ischemia.  No acute abnormality. Electronically Signed   By: Franchot Gallo [name]   On: 06/27/2015 12:31   Miguel  Brain Wo Contrast  06/27/2015  CLINICAL DATA:  70 year old hypertensive male with history of polysubstance abuse. Seizure disorder. Off of seizure medication for the past 1-2 weeks. Subsequent encounter. EXAM: MRI HEAD WITHOUT CONTRAST TECHNIQUE: Multiplanar, multiecho pulse sequences of the brain and surrounding structures were obtained without intravenous contrast. COMPARISON:  06/27/2015 CT.  12/28/2014 Miguel. FINDINGS: No acute infarct or intracranial hemorrhage. Moderate to marked global atrophy. Moderate chronic microvascular changes. No intracranial mass lesion noted on this unenhanced exam. No Miguel evidence of Wernicke's encephalopathy. Small right vertebral artery may predominantly end in a posterior inferior cerebellar artery distribution and is without change. Ectatic patent right vertebral artery and basilar artery. Internal carotid arteries are patent bilaterally. Chronic persistent opacification of the left frontal sinus. Small air-fluid level left maxillary sinus. Mucosal thickening inferior right maxillary sinus. Mild mucosal thickening ethmoid sinus air cells bilaterally. Cervical medullary junction unremarkable. No acute orbital abnormality. IMPRESSION: No acute infarct or intracranial hemorrhage. Moderate to marked global atrophy. Moderate chronic microvascular changes. No intracranial mass lesion noted on this unenhanced exam. No Miguel evidence of Wernicke's encephalopathy. Chronic persistent opacification of the left frontal sinus. Small air-fluid level left maxillary sinus. Mucosal thickening inferior right maxillary sinus. Mild mucosal thickening ethmoid sinus air cells bilaterally. Electronically Signed   By: Genia Del [name]   On: 06/27/2015 20:14        Scheduled Meds: . aspirin EC  81 mg Oral Daily  . atorvastatin  40 mg Oral q1800  . docusate sodium  100 mg Oral BID  . feeding supplement (ENSURE ENLIVE)  237 mL Oral TID BM  . folic acid  1 mg Oral Daily  . levETIRAcetam  750 mg  Oral BID  . metoprolol tartrate  12.5 mg Oral BID  . multivitamin with minerals  1 tablet Oral Daily  . oxcarbazepine  600 mg Oral BID  . sodium chloride flush  3 mL Intravenous Q12H  . thiamine  100 mg Oral Daily   Or  . thiamine  100 mg Intravenous Daily   Continuous Infusions: . sodium chloride 50 mL/hr at 06/28/15 1721        Time spent: 25 minutes    Elmarie Shiley, MD [facility] Pager [phone]  If 7PM-7AM, please contact night-coverage [email or web address] Password [email or web address] 06/29/2015, 11:02 AM

## 2015-06-29 NOTE — Care Management Note (Signed)
Case Management Note  Patient Details  Name: Miguel Hanson MRN: [id number] Date of Birth: 20-Oct-1945  Subjective/Objective:    Acute Encephalopath, alcohol abuse                Action/Plan: Discharge Planning:  NCM spoke to pt and offered choice for [facility]. Pt refused HH. States he has RW at home. States dtr, [name] assist pt at home.   Please see previous NCM notes about PCP's  PCP-[facility]  Expected Discharge Date:  06/29/2015             Expected Discharge Plan:  Home/Self Care  In-House Referral:  NA  Discharge planning Services  CM Consult  Post Acute Care Choice:  Home Health Choice offered to:  Patient  DME Arranged:  N/A DME Agency:  NA  HH Arranged:  Patient Refused [place] Agency:  NA  Status of Service:  Completed, signed off  Medicare Important Message Given:    Date Medicare IM Given:    Medicare IM give by:    Date Additional Medicare IM Given:    Additional Medicare Important Message give by:     If discussed at [place] of Stay Meetings, dates discussed:    Additional Comments:  Erenest Rasher, RN 06/29/2015, 3:12 PM

## 2015-06-30 DIAGNOSIS — G9340 Encephalopathy, unspecified: Secondary | ICD-10-CM | POA: Diagnosis not present

## 2015-06-30 DIAGNOSIS — D696 Thrombocytopenia, unspecified: Secondary | ICD-10-CM

## 2015-06-30 DIAGNOSIS — I5032 Chronic diastolic (congestive) heart failure: Secondary | ICD-10-CM | POA: Diagnosis not present

## 2015-06-30 DIAGNOSIS — J410 Simple chronic bronchitis: Secondary | ICD-10-CM

## 2015-06-30 DIAGNOSIS — F1010 Alcohol abuse, uncomplicated: Secondary | ICD-10-CM

## 2015-06-30 MED ORDER — OXCARBAZEPINE 600 MG PO TABS: 600.0000 mg | ORAL_TABLET | Freq: Two times a day (BID) | ORAL | Status: DC

## 2015-06-30 MED ORDER — ENSURE ENLIVE PO LIQD: 237.0000 mL | Freq: Three times a day (TID) | ORAL | Status: AC

## 2015-06-30 MED ORDER — FOLIC ACID 1 MG PO TABS: 1.0000 mg | ORAL_TABLET | Freq: Every day | ORAL | Status: DC

## 2015-06-30 MED ORDER — THIAMINE HCL 100 MG PO TABS: 100.0000 mg | ORAL_TABLET | Freq: Every day | ORAL | Status: AC

## 2015-06-30 MED ORDER — LEVETIRACETAM 750 MG PO TABS: 750.0000 mg | ORAL_TABLET | Freq: Two times a day (BID) | ORAL | Status: DC

## 2015-06-30 MED ORDER — DOCUSATE SODIUM 100 MG PO CAPS: 100.0000 mg | ORAL_CAPSULE | Freq: Two times a day (BID) | ORAL | Status: DC

## 2015-06-30 MED ORDER — ADULT MULTIVITAMIN W/MINERALS CH: 1.0000 | ORAL_TABLET | Freq: Every day | ORAL | Status: AC

## 2015-06-30 MED ORDER — METOPROLOL TARTRATE 25 MG PO TABS: 12.5000 mg | ORAL_TABLET | Freq: Two times a day (BID) | ORAL | Status: DC

## 2015-06-30 NOTE — Discharge Instructions (Signed)
Confusion Confusion is the inability to think with your usual speed or clarity. Confusion may come on quickly or slowly over time. How quickly the confusion comes on depends on the cause. Confusion can be due to any number of causes. CAUSES   Concussion, head injury, or head trauma.  Seizures.  Stroke.  Fever.  Brain tumor.  Age related decreased brain function (dementia).  Heightened emotional states like rage or terror.  Mental illness in which the person loses the ability to determine what is real and what is not (hallucinations).  Infections such as a urinary tract infection (UTI).  Toxic effects from alcohol, drugs, or prescription medicines.  Dehydration and an imbalance of salts in the body (electrolytes).  Lack of sleep.  Low blood sugar (diabetes).  Low levels of oxygen from conditions such as chronic lung disorders.  Drug interactions or other medicine side effects.  Nutritional deficiencies, especially niacin, thiamine, vitamin C, or vitamin B.  Sudden drop in body temperature (hypothermia).  Change in routine, such as when traveling or hospitalized. SIGNS AND SYMPTOMS  People often describe their thinking as cloudy or unclear when they are confused. Confusion can also include feeling disoriented. That means you are unaware of where or who you are. You may also not know what the date or time is. If confused, you may also have difficulty paying attention, remembering, and making decisions. Some people also act aggressively when they are confused.  DIAGNOSIS  The medical evaluation of confusion may include:  Blood and urine tests.  X-rays.  Brain and nervous system tests.  Analyzing your brain waves (electroencephalogram or EEG).  Magnetic resonance imaging (MRI) of your head.  Computed tomography (CT) scan of your head.  Mental status tests in which your health care provider may ask many questions. Some of these questions may seem silly or strange,  but they are a very important test to help diagnose and treat confusion. TREATMENT  An admission to the hospital may not be needed, but a person with confusion should not be left alone. Stay with a family member or friend until the confusion clears. Avoid alcohol, pain relievers, or sedative drugs until you have fully recovered. Do not drive until directed by your health care provider. HOME CARE INSTRUCTIONS  What family and friends can do:  To find out if someone is confused, ask the person to state his or her name, age, and the date. If the person is unsure or answers incorrectly, he or she is confused.  Always introduce yourself, no matter how well the person knows you.  Often remind the person of his or her location.  Place a calendar and clock near the confused person.  Help the person with his or her medicines. You may want to use a pill box, an alarm as a reminder, or give the person each dose as prescribed.  Talk about current events and plans for the day.  Try to keep the environment calm, quiet, and peaceful.  Make sure the person keeps follow-up visits with his or her health care provider. PREVENTION  Ways to prevent confusion:  Avoid alcohol.  Eat a balanced diet.  Get enough sleep.  Take medicine only as directed by your health care provider.  Do not become isolated. Spend time with other people and make plans for your days.  Keep careful watch on your blood sugar levels if you are diabetic. SEEK IMMEDIATE MEDICAL CARE IF:   You develop severe headaches, repeated vomiting, seizures, blackouts, or   slurred speech.  There is increasing confusion, weakness, numbness, restlessness, or personality changes.  You develop a loss of balance, have marked dizziness, feel uncoordinated, or fall.  You have delusions, hallucinations, or develop severe anxiety.  Your family members think you need to be rechecked.   This information is not intended to replace advice given  to you by your health care provider. Make sure you discuss any questions you have with your health care provider.   Document Released: 03/26/2004 Document Revised: 03/09/2014 Document Reviewed: 03/24/2013 Elsevier Interactive Patient Education 2016 [employer]  

## 2015-06-30 NOTE — Discharge Summary (Signed)
Physician Discharge Summary  Miguel Hanson [id number] DOB: April 11, 1945 DOA: 06/27/2015  PCP: [place] date: 06/27/2015 Discharge date: 06/30/2015  Time spent: 45 minutes  Recommendations for Outpatient Follow-up:  Patient will be discharged to home.  Patient will need to follow up with primary care provider within one week of discharge.  Patient should continue medications as prescribed.  Patient should follow a heart healthy diet.   Discharge Diagnoses:  Acute encephalopathy related to seizure Seizure disorder Alcohol abuse Chronic thrombocytopenia Chronic diastolic heart failure Fall with possible syncope and hypertension  Discharge Condition: Stable  Diet recommendation: Heart healthy  Filed Weights   06/27/15 1748 06/29/15 0604 06/30/15 0526  Weight: 56 kg (123 lb 7.3 oz) 57.2 kg (126 lb 1.7 oz) 60.2 kg (132 lb 11.5 oz)    History of present illness:  On 06/27/2015 by Dr. Niel Hummer Miguel Hanson is a 70 y.o. male with medical history significant of HTN, polysubstance abuse, Stroke, hepatic encephalopathy, alcohol withdrawal, question of underlying Wernicke's encephalopathy/Korsakoff syndrome, PTSD, and seizure disorder,with some seizures allegedly being alcohol related, no compliant with medications who presents with AMS.  Per report patient was found on the floor by a CNA. His wife heard a noise, she was not able to check on him because she is non ambulatory. Fall happens around 8;30 am, but unknown last time of seeing normal. I spoke with patient ' wife,at baseline [name] is able to have A normal conversation, able to answer questions. His last alcohol drink was day prior to admission. Per wife he doesn't drink every day. He doesn't have a PCP here in [place]. Per wife every time patient has a seizure he usually gets very confuse after.   Patient denies chest pain, dyspnea, abdominal pain, nausea, vomiting. He is confuse, only able to  answer yes or no questions.   Hospital Course:  Acute Encephalopathy related to seizure -Improving -Patient was found confuse, on the floor. He has not been taking his anti-seizure medications for 1 week as he ran out.  -CT head no acute finding. UA and CXR negative for infection. UDS negative. Alcohol < 5.  Ammonia normal, MRI negative for stroke.  -CIWA protocol to prevent further withdrawal.  -Thiamine and folate to prevent wernicke's encephalopathy -Continue with Keppra and trileptal.  -neurology consulted and appreciated. Felt patient to be at baseline, continue home meds at discharge.  -Scripts  for Keppra, metoprolol, trileptal at discharge   Seizure disorder -Continue withKeppra 750 mg BID to PO and Continue with trileptal.  -Neurology consulted and recommended continuing home meds -[facility] up with [facility]  Alcohol abuse -Continues to drink per wife, but less frequent.  -was placed on CIWA, thiamine, folate -No signs of withdrawal  Thrombocytopenia, chronic -Suspect related to alcohol   Chronic diastolic heart failure -compensated.  -Not on lasix.  -Continue with metoprolol.   Fall, Questionable syncope  -EKG prolong PR, sinus rhythm.  -Troponin negative -PT recommended HH, patient refused.  Hypertension  -continue with metoprolol.   Procedures: None  Consultations: Neurology  Discharge Exam: Filed Vitals:   06/29/15 2054 06/30/15 0526  BP: 155/92 155/74  Pulse: 64 71  Temp: 98.6 F (37 C) 98.6 F (37 C)  Resp: 18 18     General: Well developed, well nourished, NAD, appears stated age  42: NCAT,mucous membranes moist.  Cardiovascular: S1 S2 auscultated, no murmurs, Regular rate and rhythm.  Respiratory: Clear to auscultation bilaterally  Abdomen: Soft, nontender,  nondistended, + bowel sounds  Extremities: warm dry without cyanosis clubbing or edema  Neuro: AAOx3, hard of hearing, otherwise  nonfocal  Psych: Normal affect and demeanor  Discharge Instructions      Discharge Instructions    Discharge instructions    Complete by:  As directed   Patient will be discharged to home.  Patient will need to follow up with primary care provider within one week of discharge.  Patient should continue medications as prescribed.  Patient should follow a heart healthy diet.            Medication List    STOP taking these medications        traZODone 50 MG tablet  Commonly known as:  DESYREL      TAKE these medications        aspirin 325 MG tablet  Take 1 tablet (325 mg total) by mouth daily.     atorvastatin 40 MG tablet  Commonly known as:  LIPITOR  Take 1 tablet (40 mg total) by mouth daily at 6 PM.     docusate sodium 100 MG capsule  Commonly known as:  COLACE  Take 1 capsule (100 mg total) by mouth 2 (two) times daily.     feeding supplement (ENSURE ENLIVE) Liqd  Take 237 mLs by mouth 3 (three) times daily between meals.     folic acid 1 MG tablet  Commonly known as:  FOLVITE  Take 1 tablet (1 mg total) by mouth daily.     levETIRAcetam 750 MG tablet  Commonly known as:  KEPPRA  Take 1 tablet (750 mg total) by mouth 2 (two) times daily.     metoprolol tartrate 25 MG tablet  Commonly known as:  LOPRESSOR  Take 0.5 tablets (12.5 mg total) by mouth 2 (two) times daily.     multivitamin with minerals Tabs tablet  Take 1 tablet by mouth daily.     oxcarbazepine 600 MG tablet  Commonly known as:  TRILEPTAL  Take 1 tablet (600 mg total) by mouth 2 (two) times daily.     thiamine 100 MG tablet  Take 1 tablet (100 mg total) by mouth daily.       No Known Allergies Follow-up Information    Follow up with [facility].   Why:  Please call for an appointment.   Contact information:   [address] [place] [phone]      Follow up with [facility]. Schedule an appointment as  soon as possible for a visit in 1 week.   Specialty:  General Practice   Why:  Hospital follow up   Contact information:   [place] [id number] [phone]        The results of significant diagnostics from this hospitalization (including imaging, microbiology, ancillary and laboratory) are listed below for reference.    Significant Diagnostic Studies: Dg Chest 2 View  06/27/2015  CLINICAL DATA:  Pt had seizure today. Pt having confusion. Pt unable to give history. Hx of HTN. EXAM: CHEST  2 VIEW COMPARISON:  12/27/2014 FINDINGS: Cardiac silhouette is normal in size and configuration. No mediastinal or hilar masses or convincing adenopathy. Lungs are clear.  No pleural effusion or pneumothorax. Bony thorax is demineralized. There are multiple wedge-shaped compression fractures along the mid and lower thoracic spine. IMPRESSION: No acute cardiopulmonary disease. Electronically Signed   By: Lajean Manes [name]   On: 06/27/2015  17:20   Ct Head Wo Contrast  06/27/2015  CLINICAL DATA:  Fall.  Seizure.  Decreased left grip EXAM: CT HEAD WITHOUT CONTRAST TECHNIQUE: Contiguous axial images were obtained from the base of the skull through the vertex without intravenous contrast. COMPARISON:  CT 12/27/2014 FINDINGS: Moderate atrophy.  Negative for hydrocephalus. Chronic microvascular ischemic change throughout the white matter. Negative for acute infarct. Negative for acute hemorrhage or mass. No shift of the midline structures. Negative for skull fracture. Chronic bony thickening left sphenoid sinus related to chronic sinusitis. This is stable. Opacification left frontal sinus unchanged. IMPRESSION: Atrophy and chronic microvascular ischemia.  No acute abnormality. Electronically Signed   By: Franchot Gallo [name]   On: 06/27/2015 12:31   [name] Contrast  06/27/2015  CLINICAL DATA:  70 year old hypertensive male with history of polysubstance abuse. Seizure disorder. Off of seizure  medication for the past 1-2 weeks. Subsequent encounter. EXAM: MRI HEAD WITHOUT CONTRAST TECHNIQUE: Multiplanar, multiecho pulse sequences of the brain and surrounding structures were obtained without intravenous contrast. COMPARISON:  06/27/2015 CT.  12/28/2014 MR. FINDINGS: No acute infarct or intracranial hemorrhage. Moderate to marked global atrophy. Moderate chronic microvascular changes. No intracranial mass lesion noted on this unenhanced exam. No MR evidence of Wernicke's encephalopathy. Small right vertebral artery may predominantly end in a posterior inferior cerebellar artery distribution and is without change. Ectatic patent right vertebral artery and basilar artery. Internal carotid arteries are patent bilaterally. Chronic persistent opacification of the left frontal sinus. Small air-fluid level left maxillary sinus. Mucosal thickening inferior right maxillary sinus. Mild mucosal thickening ethmoid sinus air cells bilaterally. Cervical medullary junction unremarkable. No acute orbital abnormality. IMPRESSION: No acute infarct or intracranial hemorrhage. Moderate to marked global atrophy. Moderate chronic microvascular changes. No intracranial mass lesion noted on this unenhanced exam. No MR evidence of Wernicke's encephalopathy. Chronic persistent opacification of the left frontal sinus. Small air-fluid level left maxillary sinus. Mucosal thickening inferior right maxillary sinus. Mild mucosal thickening ethmoid sinus air cells bilaterally. Electronically Signed   By: Genia Del [name]   On: 06/27/2015 20:14    Microbiology: No results found for this or any previous visit (from the past 240 hour(s)).   Labs: Basic Metabolic Panel:  Recent Labs Lab 06/27/15 1115 06/28/15 0516 06/29/15 1120  NA 141 135 141  K 4.8 3.6 3.5  CL 104 104 106  CO2 25 22 25   GLUCOSE 112* 95 128*  BUN 13 7 10   CREATININE 0.74 0.60* 0.60*  CALCIUM 9.6 8.4* 9.1   Liver Function Tests:  Recent Labs Lab  06/27/15 1115  AST 31  ALT 15*  ALKPHOS 65  BILITOT 0.8  PROT 8.0  ALBUMIN 4.4   No results for input(s): LIPASE, AMYLASE in the last 168 hours.  Recent Labs Lab 06/27/15 1802  AMMONIA 17   CBC:  Recent Labs Lab 06/27/15 1115 06/28/15 0516 06/29/15 1120  WBC 7.9 11.0* 9.0  NEUTROABS 5.0  --   --   HGB 14.3 13.9 14.1  HCT 38.6* 37.3* 37.8*  MCV 96.5 96.1 95.9  PLT 134* 128* 129*   Cardiac Enzymes:  Recent Labs Lab 06/27/15 1115 06/27/15 1802 06/27/15 2315 06/28/15 0516  TROPONINI <0.03 <0.03 <0.03 <0.03   BNP: BNP (last 3 results) No results for input(s): BNP in the last 8760 hours.  ProBNP (last 3 results) No results for input(s): PROBNP in the last 8760 hours.  CBG: No results for input(s): GLUCAP in the last 168 hours.  SignedCristal Ford  [facility] 06/30/2015, 9:56 AM

## 2015-07-23 ENCOUNTER — Emergency Department (HOSPITAL_COMMUNITY): Payer: Medicare Other

## 2015-07-23 ENCOUNTER — Encounter (HOSPITAL_COMMUNITY): Payer: Self-pay | Admitting: Emergency Medicine

## 2015-07-23 ENCOUNTER — Emergency Department (HOSPITAL_COMMUNITY)
Admission: EM | Admit: 2015-07-23 | Discharge: 2015-07-23 | Disposition: A | Discharge status: Home / Self Care | Payer: Medicare Other | Attending: Emergency Medicine | Admitting: Emergency Medicine

## 2015-07-23 DIAGNOSIS — R569 Unspecified convulsions: Secondary | ICD-10-CM | POA: Insufficient documentation

## 2015-07-23 DIAGNOSIS — I10 Essential (primary) hypertension: Secondary | ICD-10-CM | POA: Diagnosis not present

## 2015-07-23 DIAGNOSIS — G40909 Epilepsy, unspecified, not intractable, without status epilepticus: Secondary | ICD-10-CM

## 2015-07-23 DIAGNOSIS — Z7982 Long term (current) use of aspirin: Secondary | ICD-10-CM | POA: Insufficient documentation

## 2015-07-23 DIAGNOSIS — F1010 Alcohol abuse, uncomplicated: Secondary | ICD-10-CM

## 2015-07-23 DIAGNOSIS — Z8673 Personal history of transient ischemic attack (TIA), and cerebral infarction without residual deficits: Secondary | ICD-10-CM | POA: Diagnosis not present

## 2015-07-23 DIAGNOSIS — R4182 Altered mental status, unspecified: Secondary | ICD-10-CM | POA: Diagnosis not present

## 2015-07-23 DIAGNOSIS — Z9119 Patient's noncompliance with other medical treatment and regimen: Secondary | ICD-10-CM | POA: Diagnosis not present

## 2015-07-23 DIAGNOSIS — F1910 Other psychoactive substance abuse, uncomplicated: Secondary | ICD-10-CM | POA: Diagnosis not present

## 2015-07-23 DIAGNOSIS — F17210 Nicotine dependence, cigarettes, uncomplicated: Secondary | ICD-10-CM | POA: Insufficient documentation

## 2015-07-23 DIAGNOSIS — Z8659 Personal history of other mental and behavioral disorders: Secondary | ICD-10-CM | POA: Insufficient documentation

## 2015-07-23 DIAGNOSIS — Z79899 Other long term (current) drug therapy: Secondary | ICD-10-CM | POA: Diagnosis not present

## 2015-07-23 LAB — URINALYSIS, ROUTINE W REFLEX MICROSCOPIC
Bilirubin Urine: NEGATIVE
GLUCOSE, UA: NEGATIVE mg/dL
HGB URINE DIPSTICK: NEGATIVE
KETONES UR: NEGATIVE mg/dL
LEUKOCYTES UA: NEGATIVE
Nitrite: NEGATIVE
PROTEIN: NEGATIVE mg/dL
Specific Gravity, Urine: 1.008 (ref 1.005–1.030)
pH: 6 (ref 5.0–8.0)

## 2015-07-23 LAB — DIFFERENTIAL
BASOS ABS: 0 10*3/uL (ref 0.0–0.1)
BASOS PCT: 0 %
EOS ABS: 0.9 10*3/uL — AB (ref 0.0–0.7)
Eosinophils Relative: 10 %
LYMPHS ABS: 3.9 10*3/uL (ref 0.7–4.0)
Lymphocytes Relative: 45 %
MONO ABS: 0.6 10*3/uL (ref 0.1–1.0)
Monocytes Relative: 7 %
NEUTROS PCT: 38 %
Neutro Abs: 3.3 10*3/uL (ref 1.7–7.7)

## 2015-07-23 LAB — I-STAT CHEM 8, ED
BUN: 5 mg/dL — ABNORMAL LOW (ref 6–20)
CALCIUM ION: 1.09 mmol/L — AB (ref 1.13–1.30)
CREATININE: 0.6 mg/dL — AB (ref 0.61–1.24)
Chloride: 103 mmol/L (ref 101–111)
GLUCOSE: 105 mg/dL — AB (ref 65–99)
HCT: 48 % (ref 39.0–52.0)
HEMOGLOBIN: 16.3 g/dL (ref 13.0–17.0)
Potassium: 3.6 mmol/L (ref 3.5–5.1)
Sodium: 139 mmol/L (ref 135–145)
TCO2: 21 mmol/L (ref 0–100)

## 2015-07-23 LAB — CARBAMAZEPINE LEVEL, TOTAL: Carbamazepine Lvl: <0.1 ug/mL — ABNORMAL LOW (ref 4.0–12.0)

## 2015-07-23 LAB — COMPREHENSIVE METABOLIC PANEL
ALT: 18 U/L (ref 17–63)
ANION GAP: 10 (ref 5–15)
AST: 30 U/L (ref 15–41)
Albumin: 4.1 g/dL (ref 3.5–5.0)
Alkaline Phosphatase: 76 U/L (ref 38–126)
BILIRUBIN TOTAL: 0.8 mg/dL (ref 0.3–1.2)
BUN: <5 mg/dL — ABNORMAL LOW (ref 6–20)
CHLORIDE: 107 mmol/L (ref 101–111)
CO2: 21 mmol/L — ABNORMAL LOW (ref 22–32)
CREATININE: 0.79 mg/dL (ref 0.61–1.24)
Calcium: 9.3 mg/dL (ref 8.9–10.3)
GFR calc Af Amer: >60 mL/min (ref >60)
Glucose, Bld: 114 mg/dL — ABNORMAL HIGH (ref 65–99)
POTASSIUM: 3.6 mmol/L (ref 3.5–5.1)
Sodium: 138 mmol/L (ref 135–145)
TOTAL PROTEIN: 7.6 g/dL (ref 6.5–8.1)

## 2015-07-23 LAB — RAPID URINE DRUG SCREEN, HOSP PERFORMED
Amphetamines: NOT DETECTED
BARBITURATES: NOT DETECTED
Benzodiazepines: NOT DETECTED
COCAINE: NOT DETECTED
Opiates: NOT DETECTED
TETRAHYDROCANNABINOL: NOT DETECTED

## 2015-07-23 LAB — CBC
HEMATOCRIT: 41.6 % (ref 39.0–52.0)
Hemoglobin: 15 g/dL (ref 13.0–17.0)
MCH: 34.7 pg — AB (ref 26.0–34.0)
MCHC: 36.1 g/dL — AB (ref 30.0–36.0)
MCV: 96.3 fL (ref 78.0–100.0)
Platelets: 117 10*3/uL — ABNORMAL LOW (ref 150–400)
RBC: 4.32 MIL/uL (ref 4.22–5.81)
RDW: 13.1 % (ref 11.5–15.5)
WBC: 8.7 10*3/uL (ref 4.0–10.5)

## 2015-07-23 LAB — I-STAT TROPONIN, ED: TROPONIN I, POC: 0 ng/mL (ref 0.00–0.08)

## 2015-07-23 LAB — CBG MONITORING, ED: Glucose-Capillary: 97 mg/dL (ref 65–99)

## 2015-07-23 LAB — ETHANOL: Alcohol, Ethyl (B): <5 mg/dL (ref <5)

## 2015-07-23 LAB — PROTIME-INR
INR: 1.23 (ref 0.00–1.49)
Prothrombin Time: 15.6 seconds — ABNORMAL HIGH (ref 11.6–15.2)

## 2015-07-23 LAB — APTT: APTT: 32 s (ref 24–37)

## 2015-07-23 MED ORDER — PROCHLORPERAZINE EDISYLATE 5 MG/ML IJ SOLN
5.0000 mg | Freq: Once | INTRAMUSCULAR | Status: AC
  Administered 2015-07-23: 5 mg via INTRAVENOUS
  Filled 2015-07-23: qty 2

## 2015-07-23 MED ORDER — THIAMINE HCL 100 MG/ML IJ SOLN
Freq: Once | INTRAVENOUS | Status: AC
  Administered 2015-07-23: 01:00:00 via INTRAVENOUS
  Filled 2015-07-23: qty 1000

## 2015-07-23 MED ORDER — SODIUM CHLORIDE 0.9 % IV SOLN
1000.0000 mg | Freq: Once | INTRAVENOUS | Status: AC
  Administered 2015-07-23: 1000 mg via INTRAVENOUS
  Filled 2015-07-23: qty 10

## 2015-07-23 MED ORDER — LEVETIRACETAM 1000 MG PO TABS: 1000.0000 mg | ORAL_TABLET | Freq: Two times a day (BID) | ORAL | Status: DC

## 2015-07-23 MED ORDER — OXCARBAZEPINE 600 MG PO TABS: 600.0000 mg | ORAL_TABLET | Freq: Two times a day (BID) | ORAL | Status: DC

## 2015-07-23 NOTE — Consult Note (Signed)
Admission H&P    Chief Complaint: Altered mental status.  HPI: Miguel Hanson is an 70 y.o. male history of hypertension, seizure disorder, alcohol abuse, sit drug abuse, depression and PTSD, brought to the emergency room and code stroke status change in mental status. He was described as awake but not following commands. Onset was at 11 PM. Patient had been drinking and typically drinks about 80 ounces of beer per day. He was nauseated and vomiting on arrival. No focal deficits were noted. CT scan of his head showed no acute intracranial abnormality. Mental status began to improved after arrival in the emergency room. He became somewhat more interactive but was still confused and disoriented. Code stroke was canceled, as recurrent seizure was felt to be likely. Blood alcohol level and urine for drug screen are pending. He has been treated with Trileptal and Keppra for management of seizures. Carbamazepine level is pending.  Past Medical History  Diagnosis Date  . Hypertension   . Seizures (HCC)   . ETOH abuse   . Drug abuse   . PTSD (post-traumatic stress disorder)   . H/O prolonged Q-T interval on ECG   . Polysubstance abuse   . Alcohol abuse   . Depression     Past Surgical History  Procedure Laterality Date  . No past surgeries      Family History  Problem Relation Age of Onset  . Hypertension Mother   . Hypertension Father    Social History:  reports that he has been smoking Cigarettes.  He has a 24.5 pack-year smoking history. He uses smokeless tobacco. He reports that he drinks about 21.6 oz of alcohol per week. He reports that he uses illicit drugs (Marijuana).  Allergies: No Known Allergies  Medications: Preadmission medications were reviewed by me.  ROS: Unavailable due to patient's altered mental status.  Physical Examination: Blood pressure 161/107, pulse 97, temperature 98.6 F (37 C), temperature source Oral, resp. rate 18, height 5\' 8"  (1.727 m), weight 58.968  kg (130 lb), SpO2 97 %.  HEENT-  Normocephalic, no lesions, without obvious abnormality.  Normal external eye and conjunctiva.  Normal TM's bilaterally.  Normal auditory canals and external ears. Normal external nose, mucus membranes and septum.  Normal pharynx. Neck supple with no masses, nodes, nodules or enlargement. Cardiovascular - regular rate and rhythm, S1, S2 normal, no murmur, click, rub or gallop Lungs - chest clear, no wheezing, rales, normal symmetric air entry Abdomen - soft, non-tender; bowel sounds normal; no masses,  no organomegaly Extremities - no joint deformities, effusion, or inflammation  Neurologic Examination: Patient appeared to be alert. He was confused and only responded with 1-2 syllable responses. He appeared to be in no acute distress other than having nausea and vomiting. Pupils were equal and reacted normally to light. Extraocular movements were full and conjugate. No facial weakness was noted. Speech was somewhat dysarthric. Patient moved extremities equally with symmetrical strength throughout. Deep tendon reflexes were normal and symmetrical throughout. Laboratory responses were flexor bilaterally.  Results for orders placed or performed during the hospital encounter of 07/23/15 (from the past 48 hour(s))  Protime-INR     Status: Abnormal   Collection Time: 07/23/15 12:19 AM  Result Value Ref Range   Prothrombin Time 15.6 (H) 11.6 - 15.2 seconds   INR 1.23 0.00 - 1.49  APTT     Status: None   Collection Time: 07/23/15 12:19 AM  Result Value Ref Range   aPTT 32 24 - 37 seconds  CBC  Status: Abnormal (Preliminary result)   Collection Time: 07/23/15 12:19 AM  Result Value Ref Range   WBC 8.7 4.0 - 10.5 K/uL   RBC 4.32 4.22 - 5.81 MIL/uL   Hemoglobin 15.0 13.0 - 17.0 g/dL   HCT 41.6 39.0 - 52.0 %   MCV 96.3 78.0 - 100.0 fL   MCH 34.7 (H) 26.0 - 34.0 pg   MCHC 36.1 (H) 30.0 - 36.0 g/dL   RDW 13.1 11.5 - 15.5 %   Platelets PENDING 150 - 400  K/uL  Differential     Status: None (Preliminary result)   Collection Time: 07/23/15 12:19 AM  Result Value Ref Range   Neutrophils Relative % PENDING %   Neutro Abs PENDING 1.7 - 7.7 K/uL   Band Neutrophils PENDING %   Lymphocytes Relative PENDING %   Lymphs Abs PENDING 0.7 - 4.0 K/uL   Monocytes Relative PENDING %   Monocytes Absolute PENDING 0.1 - 1.0 K/uL   Eosinophils Relative PENDING %   Eosinophils Absolute PENDING 0.0 - 0.7 K/uL   Basophils Relative PENDING %   Basophils Absolute PENDING 0.0 - 0.1 K/uL   WBC Morphology PENDING    RBC Morphology PENDING    Smear Review PENDING    nRBC PENDING 0 /100 WBC   Metamyelocytes Relative PENDING %   Myelocytes PENDING %   Promyelocytes Absolute PENDING %   Blasts PENDING %  Comprehensive metabolic panel     Status: Abnormal   Collection Time: 07/23/15 12:19 AM  Result Value Ref Range   Sodium 138 135 - 145 mmol/L   Potassium 3.6 3.5 - 5.1 mmol/L   Chloride 107 101 - 111 mmol/L   CO2 21 (L) 22 - 32 mmol/L   Glucose, Bld 114 (H) 65 - 99 mg/dL   BUN <5 (L) 6 - 20 mg/dL   Creatinine, Ser 0.79 0.61 - 1.24 mg/dL   Calcium 9.3 8.9 - 10.3 mg/dL   Total Protein 7.6 6.5 - 8.1 g/dL   Albumin 4.1 3.5 - 5.0 g/dL   AST 30 15 - 41 U/L   ALT 18 17 - 63 U/L   Alkaline Phosphatase 76 38 - 126 U/L   Total Bilirubin 0.8 0.3 - 1.2 mg/dL   GFR calc non Af Amer >60 >60 mL/min   GFR calc Af Amer >60 >60 mL/min    Comment: (NOTE) The eGFR has been calculated using the CKD EPI equation. This calculation has not been validated in all clinical situations. eGFR's persistently <60 mL/min signify possible Chronic Kidney Disease.    Anion gap 10 5 - 15  I-stat troponin, ED     Status: None   Collection Time: 07/23/15 12:20 AM  Result Value Ref Range   Troponin i, poc 0.00 0.00 - 0.08 ng/mL   Comment 3            Comment: Due to the release kinetics of cTnI, a negative result within the first hours of the onset of symptoms does not rule  out myocardial infarction with certainty. If myocardial infarction is still suspected, repeat the test at appropriate intervals.   I-Stat Chem 8, ED     Status: Abnormal   Collection Time: 07/23/15 12:21 AM  Result Value Ref Range   Sodium 139 135 - 145 mmol/L   Potassium 3.6 3.5 - 5.1 mmol/L   Chloride 103 101 - 111 mmol/L   BUN 5 (L) 6 - 20 mg/dL   Creatinine, Ser 0.60 (L) 0.61 - 1.24  mg/dL   Glucose, Bld 105 (H) 65 - 99 mg/dL   Calcium, Ion 1.09 (L) 1.13 - 1.30 mmol/L   TCO2 21 0 - 100 mmol/L   Hemoglobin 16.3 13.0 - 17.0 g/dL   HCT 48.0 39.0 - 52.0 %  CBG monitoring, ED     Status: None   Collection Time: 07/23/15 12:40 AM  Result Value Ref Range   Glucose-Capillary 97 65 - 99 mg/dL   Ct Head Wo Contrast  07/23/2015  CLINICAL DATA:  Code stroke.  Altered mental status. EXAM: CT HEAD WITHOUT CONTRAST TECHNIQUE: Contiguous axial images were obtained from the base of the skull through the vertex without intravenous contrast. COMPARISON:  CT and MRI 06/27/2015 FINDINGS: Brain: No evidence of acute infarction, hemorrhage, extra-axial collection, hydrocephalus or mass effect. Generalized cerebral and cerebellar atrophy and chronic small vessel ischemia unchanged from prior. Vascular: No hyperdense vessel or unexpected calcification. Skull: Negative for fracture or focal lesion. Sinuses/Orbits: Chronic opacification of left frontal sinus. Chronic sclerosis about the left sphenoid sinus with mucosal thickening. Chronic mucosal thickening and fluid level in the left maxillary sinus. Other: None. IMPRESSION: 1. Stable generalized atrophy and chronic small vessel ischemia. No hemorrhage or CT findings of acute infarct. 2. Chronic paranasal sinus disease. These results were called by telephone at the time of interpretation on 07/23/2015 at 12:39 am to Dr. Nicole Kindred , who verbally acknowledged these results. Electronically Signed   By: Jeb Levering [name]   On: 07/23/2015 00:39     Assessment/Plan 70 year old man with a history of polysubstance abuse, including alcohol and illicit drugs, presenting with probable recurrent partial seizure activity, manifested by confusion and inattention to surroundings. Alcohol level and urine for drug screening is still pending. No focal deficits were noted on examination, and CT scan of his head was unremarkable for acute findings.  Recommendations: 1. Loading dose of Keppra 1000 mg IV 2. Increase Keppra to 1000 mg twice a day for maintenance (from 750 mg twice a day) 3. No change in Trileptal dosing for now 4. Alcohol withdrawal precautions 5. Thiamine and multivitamins daily  We will continue to follow this patient with you.  C.R. Nicole Kindred, MD [facility]  07/23/2015, 1:07 AM

## 2015-07-23 NOTE — ED Notes (Addendum)
Arrived at CT scan with Dr. Nicole Kindred Miguel Dibble response nurse and primary RN .

## 2015-07-23 NOTE — ED Notes (Signed)
Returned to room from CT scan , placed on a monitor / pulse oximetry .

## 2015-07-23 NOTE — ED Notes (Signed)
Dr. Nicole Kindred cancelled code stroke on pt.

## 2015-07-23 NOTE — ED Notes (Signed)
Pt. arrived with EMS from home family reports altered LOC with confusion /lethargy , LSN  07/22/2013 1900 , vomitted en route . Transported to CT scan after brief evaluation by EDP and neurologist at arrival .

## 2015-07-23 NOTE — Discharge Instructions (Signed)
You were seen today for confusion. It is very important that you take your seizure medication as prescribed. He likely had a seizure prior to arrival resulting in confusion. Abstain from alcohol.  Confusion Confusion is the inability to think with your usual speed or clarity. Confusion may come on quickly or slowly over time. How quickly the confusion comes on depends on the cause. Confusion can be due to any number of causes. CAUSES   Concussion, head injury, or head trauma.  Seizures.  Stroke.  Fever.  Brain tumor.  Age related decreased brain function (dementia).  Heightened emotional states like rage or terror.  Mental illness in which the person loses the ability to determine what is real and what is not (hallucinations).  Infections such as a urinary tract infection (UTI).  Toxic effects from alcohol, drugs, or prescription medicines.  Dehydration and an imbalance of salts in the body (electrolytes).  Lack of sleep.  Low blood sugar (diabetes).  Low levels of oxygen from conditions such as chronic lung disorders.  Drug interactions or other medicine side effects.  Nutritional deficiencies, especially niacin, thiamine, vitamin C, or vitamin B.  Sudden drop in body temperature (hypothermia).  Change in routine, such as when traveling or hospitalized. SIGNS AND SYMPTOMS  People often describe their thinking as cloudy or unclear when they are confused. Confusion can also include feeling disoriented. That means you are unaware of where or who you are. You may also not know what the date or time is. If confused, you may also have difficulty paying attention, remembering, and making decisions. Some people also act aggressively when they are confused.  DIAGNOSIS  The medical evaluation of confusion may include:  Blood and urine tests.  X-rays.  Brain and nervous system tests.  Analyzing your brain waves (electroencephalogram or EEG).  Magnetic resonance imaging  (MRI) of your head.  Computed tomography (CT) scan of your head.  Mental status tests in which your health care provider may ask many questions. Some of these questions may seem silly or strange, but they are a very important test to help diagnose and treat confusion. TREATMENT  An admission to the hospital may not be needed, but a person with confusion should not be left alone. Stay with a family member or friend until the confusion clears. Avoid alcohol, pain relievers, or sedative drugs until you have fully recovered. Do not drive until directed by your health care provider. HOME CARE INSTRUCTIONS  What family and friends can do:  To find out if someone is confused, ask the person to state his or her name, age, and the date. If the person is unsure or answers incorrectly, he or she is confused.  Always introduce yourself, no matter how well the person knows you.  Often remind the person of his or her location.  Place a calendar and clock near the confused person.  Help the person with his or her medicines. You may want to use a pill box, an alarm as a reminder, or give the person each dose as prescribed.  Talk about current events and plans for the day.  Try to keep the environment calm, quiet, and peaceful.  Make sure the person keeps follow-up visits with his or her health care provider. PREVENTION  Ways to prevent confusion:  Avoid alcohol.  Eat a balanced diet.  Get enough sleep.  Take medicine only as directed by your health care provider.  Do not become isolated. Spend time with other people and make  plans for your days.  Keep careful watch on your blood sugar levels if you are diabetic. SEEK IMMEDIATE MEDICAL CARE IF:   You develop severe headaches, repeated vomiting, seizures, blackouts, or slurred speech.  There is increasing confusion, weakness, numbness, restlessness, or personality changes.  You develop a loss of balance, have marked dizziness, feel  uncoordinated, or fall.  You have delusions, hallucinations, or develop severe anxiety.  Your family members think you need to be rechecked.   This information is not intended to replace advice given to you by your health care provider. Make sure you discuss any questions you have with your health care provider.   Document Released: 03/26/2004 Document Revised: 03/09/2014 Document Reviewed: 03/24/2013 Elsevier Interactive Patient Education [employer]. Epilepsy Epilepsy is a disorder in which a person has repeated seizures over time. A seizure is a release of abnormal electrical activity in the brain. Seizures can cause a change in attention, behavior, or the ability to remain awake and alert (altered mental status). Seizures often involve uncontrollable shaking (convulsions).  Most people with epilepsy lead normal lives. However, people with epilepsy are at an increased risk of falls, accidents, and injuries. Therefore, it is important to begin treatment right away. CAUSES  Epilepsy has many possible causes. Anything that disturbs the normal pattern of brain cell activity can lead to seizures. This may include:   Head injury.  Birth trauma.  High fever as a child.  Stroke.  Bleeding into or around the brain.  Certain drugs.  Prolonged low oxygen, such as what occurs after CPR efforts.  Abnormal brain development.  Certain illnesses, such as meningitis, encephalitis (brain infection), malaria, and other infections.  An imbalance of nerve signaling chemicals (neurotransmitters).  SIGNS AND SYMPTOMS  The symptoms of a seizure can vary greatly from one person to another. Right before a seizure, you may have a warning (aura) that a seizure is about to occur. An aura may include the following symptoms:  Fear or anxiety.  Nausea.  Feeling like the room is spinning (vertigo).  Vision changes, such as seeing flashing lights or spots. Common symptoms during a seizure  include:  Abnormal sensations, such as an abnormal smell or a bitter taste in the mouth.   Sudden, general body stiffness.   Convulsions that involve rhythmic jerking of the face, arm, or leg on one or both sides.   Sudden change in consciousness.   Appearing to be awake but not responding.   Appearing to be asleep but cannot be awakened.   Grimacing, chewing, lip smacking, drooling, tongue biting, or loss of bowel or bladder control. After a seizure, you may feel sleepy for a while. DIAGNOSIS  Your health care provider will ask about your symptoms and take a medical history. Descriptions from any witnesses to your seizures will be very helpful in the diagnosis. A physical exam, including a detailed neurological exam, is necessary. Various tests may be done, such as:   An electroencephalogram (EEG). This is a painless test of your brain waves. In this test, a diagram is created of your brain waves. These diagrams can be interpreted by a specialist.  An MRI of the brain.   A CT scan of the brain.   A spinal tap (lumbar puncture, LP).  Blood tests to check for signs of infection or abnormal blood chemistry. TREATMENT  There is no cure for epilepsy, but it is generally treatable. Once epilepsy is diagnosed, it is important to begin treatment as soon as  possible. For most people with epilepsy, seizures can be controlled with medicines. The following may also be used:  A pacemaker for the brain (vagus nerve stimulator) can be used for people with seizures that are not well controlled by medicine.  Surgery on the brain. For some people, epilepsy eventually goes away. HOME CARE INSTRUCTIONS   Follow your health care provider's recommendations on driving and safety in normal activities.  Get enough rest. Lack of sleep can cause seizures.  Only take over-the-counter or prescription medicines as directed by your health care provider. Take any prescribed medicine exactly as  directed.  Avoid any known triggers of your seizures.  Keep a seizure diary. Record what you recall about any seizure, especially any possible trigger.   Make sure the people you live and work with know that you are prone to seizures. They should receive instructions on how to help you. In general, a witness to a seizure should:   Cushion your head and body.   Turn you on your side.   Avoid unnecessarily restraining you.   Not place anything inside your mouth.   Call for emergency medical help if there is any question about what has occurred.   Follow up with your health care provider as directed. You may need regular blood tests to monitor the levels of your medicine.  SEEK MEDICAL CARE IF:   You develop signs of infection or other illness. This might increase the risk of a seizure.   You seem to be having more frequent seizures.   Your seizure pattern is changing.  SEEK IMMEDIATE MEDICAL CARE IF:   You have a seizure that does not stop after a few moments.   You have a seizure that causes any difficulty in breathing.   You have a seizure that results in a very severe headache.   You have a seizure that leaves you with the inability to speak or use a part of your body.    This information is not intended to replace advice given to you by your health care provider. Make sure you discuss any questions you have with your health care provider.   Document Released: 02/16/2005 Document Revised: 12/07/2012 Document Reviewed: 09/28/2012 Elsevier Interactive Patient Education [employer].

## 2015-07-23 NOTE — ED Provider Notes (Signed)
CSN: [id number]     Arrival date & time 07/23/15  0013 History  By signing my name below, I, Miguel Hanson, attest that this documentation has been prepared under the direction and in the presence of Miguel Hacker, MD. Electronically Signed: Altamease Hanson, ED Scribe. 07/23/2015. 12:20 AM   Chief Complaint  Patient presents with  . Altered Mental Status   LEVEL V CAVEAT DUE TO AMS  The history is provided by the EMS personnel. No language interpreter was used.   Brought in by EMS as a Code Stroke, Miguel Hanson is a 70 y.o. male with PMHx of seizures, alcohol abuse, and HTN who presents to the Emergency Department for evaluation of AMS. Pt was last seen normal approximately 5 hours ago. EMS reports that the pt was initially unable to follow commands but had no focal deficits. Family reported to EMS that the pt typically drinks at least two 40 oz beers per day.   Past Medical History  Diagnosis Date  . Hypertension   . Seizures ([place])   . ETOH abuse   . Drug abuse   . PTSD (post-traumatic stress disorder)   . H/O prolonged Q-T interval on ECG   . Polysubstance abuse   . Alcohol abuse   . Depression    Past Surgical History  Procedure Laterality Date  . No past surgeries     Family History  Problem Relation Age of Onset  . Hypertension Mother   . Hypertension Father    Social History  Substance Use Topics  . Smoking status: Current Some Day Smoker -- 0.50 packs/day for 49 years    Types: Cigarettes  . Smokeless tobacco: Current User  . Alcohol Use: 21.6 oz/week    36 Shots of liquor per week     Comment: 03/01/2013 "drink a pint of brandy q 2 days" "Drinks alcohol often"    Review of Systems  Unable to perform ROS: Mental status change    Allergies  Review of patient's allergies indicates no known allergies.  Home Medications   Prior to Admission medications   Medication Sig Start Date End Date Taking? Authorizing Provider  aspirin 325 MG tablet Take 1  tablet (325 mg total) by mouth daily. 12/31/14   Delfina Redwood, MD  atorvastatin (LIPITOR) 40 MG tablet Take 1 tablet (40 mg total) by mouth daily at 6 PM. 12/31/14   Delfina Redwood, MD  docusate sodium (COLACE) 100 MG capsule Take 1 capsule (100 mg total) by mouth 2 (two) times daily. 06/30/15   Maryann Mikhail, DO  feeding supplement, ENSURE ENLIVE, (ENSURE ENLIVE) LIQD Take 237 mLs by mouth 3 (three) times daily between meals. 06/30/15   Maryann Mikhail, DO  folic acid (FOLVITE) 1 MG tablet Take 1 tablet (1 mg total) by mouth daily. 06/30/15   Maryann Mikhail, DO  levETIRAcetam (KEPPRA) 1000 MG tablet Take 1 tablet (1,000 mg total) by mouth 2 (two) times daily. 07/23/15   Miguel Hacker, MD  metoprolol tartrate (LOPRESSOR) 25 MG tablet Take 0.5 tablets (12.5 mg total) by mouth 2 (two) times daily. 06/30/15   Maryann Mikhail, DO  Multiple Vitamin (MULTIVITAMIN WITH MINERALS) TABS tablet Take 1 tablet by mouth daily. 06/30/15   Maryann Mikhail, DO  oxcarbazepine (TRILEPTAL) 600 MG tablet Take 1 tablet (600 mg total) by mouth 2 (two) times daily. 07/23/15   Miguel Hacker, MD  thiamine 100 MG tablet Take 1 tablet (100 mg total) by mouth daily. 06/30/15   Maryann  Mikhail, DO   BP 136/86 mmHg  Pulse 84  Temp(Src) 98.6 F (37 C) (Oral)  Resp 14  Ht 5\' 8"  (1.727 m)  Wt 130 lb (58.968 kg)  BMI 19.77 kg/m2  SpO2 99% Physical Exam  Constitutional: No distress.  Ill-appearing, ABC's intact, no acute distress  HENT:  Head: Normocephalic and atraumatic.  Mucous membranes dry  Eyes: Pupils are equal, round, and reactive to light.  Cardiovascular: Normal rate, regular rhythm and normal heart sounds.   No murmur heard. Pulmonary/Chest: Effort normal and breath sounds normal. No respiratory distress. He has no wheezes.  Abdominal: Soft. Bowel sounds are normal. There is no tenderness. There is no rebound.  Occasionally retching  Musculoskeletal: He exhibits no edema.  Neurological: He is  alert.  Follow simple commands, mimics, no facial droop noted, moves all 4 extremities without difficulty  Skin: Skin is warm and dry.  Psychiatric: He has a normal mood and affect.  Nursing note and vitals reviewed.   ED Course  Procedures (including critical care time) COORDINATION OF CARE: 12:18 AM Treatment plan includes lab work, CT head, and EKG. Dr. Nicole Kindred (Neurology) at the bridge.   Labs Review Labs Reviewed  PROTIME-INR - Abnormal; Notable for the following:    Prothrombin Time 15.6 (*)    All other components within normal limits  CBC - Abnormal; Notable for the following:    MCH 34.7 (*)    MCHC 36.1 (*)    Platelets 117 (*)    All other components within normal limits  DIFFERENTIAL - Abnormal; Notable for the following:    Eosinophils Absolute 0.9 (*)    All other components within normal limits  COMPREHENSIVE METABOLIC PANEL - Abnormal; Notable for the following:    CO2 21 (*)    Glucose, Bld 114 (*)    BUN <5 (*)    All other components within normal limits  CARBAMAZEPINE LEVEL, TOTAL - Abnormal; Notable for the following:    Carbamazepine Lvl <0.1 (*)    All other components within normal limits  I-STAT CHEM 8, ED - Abnormal; Notable for the following:    BUN 5 (*)    Creatinine, Ser 0.60 (*)    Glucose, Bld 105 (*)    Calcium, Ion 1.09 (*)    All other components within normal limits  APTT  ETHANOL  URINE RAPID DRUG SCREEN, HOSP PERFORMED  URINALYSIS, ROUTINE W REFLEX MICROSCOPIC (NOT AT [facility])  I-STAT TROPOININ, ED  CBG MONITORING, ED    Imaging Review Ct Head Wo Contrast  07/23/2015  CLINICAL DATA:  Code stroke.  Altered mental status. EXAM: CT HEAD WITHOUT CONTRAST TECHNIQUE: Contiguous axial images were obtained from the base of the skull through the vertex without intravenous contrast. COMPARISON:  CT and MRI 06/27/2015 FINDINGS: Brain: No evidence of acute infarction, hemorrhage, extra-axial collection, hydrocephalus or mass effect. Generalized  cerebral and cerebellar atrophy and chronic small vessel ischemia unchanged from prior. Vascular: No hyperdense vessel or unexpected calcification. Skull: Negative for fracture or focal lesion. Sinuses/Orbits: Chronic opacification of left frontal sinus. Chronic sclerosis about the left sphenoid sinus with mucosal thickening. Chronic mucosal thickening and fluid level in the left maxillary sinus. Other: None. IMPRESSION: 1. Stable generalized atrophy and chronic small vessel ischemia. No hemorrhage or CT findings of acute infarct. 2. Chronic paranasal sinus disease. These results were called by telephone at the time of interpretation on 07/23/2015 at 12:39 am to Dr. Nicole Kindred , who verbally acknowledged these results. Electronically Signed  By: Jeb Levering [name]   On: 07/23/2015 00:39   I have personally reviewed and evaluated these images and lab results as part of my medical decision-making.   EKG Interpretation   Date/Time:  Tuesday Jul 23 2015 00:45:33 EDT Ventricular Rate:  91 PR Interval:  206 QRS Duration: 87 QT Interval:  413 QTC Calculation: 508 R Axis:   75 Text Interpretation:  Sinus rhythm Atrial premature complex Prolonged QT  interval Confirmed by Dina Rich  MD, [place] ([id number]) on 07/23/2015 1:33:57 AM      MDM   Final diagnoses:  Altered mental status, unspecified altered mental status type  Seizure [facility])    Patient presents with altered mental status from home. Initiated as a code stroke prior to arrival. Neurology is at the bedside. Patient has a history of seizures and medication noncompliance. Also history of alcoholism. He currently is nonfocal but appears confused and has difficulty following specific commands. Neurology suspects seizure prior to arrival. She was loaded with Keppra. Code stroke was discontinued. Patient was given a banana bag given his alcohol abuse. Alcohol level is less than 5. Lab work is largely reassuring. CT scan without evidence of bleed or focal  abnormality. Patient was observed in the ER. Lamictal level is undetectable.  5:10 AM On recheck, patient is more alert. Able to answer questions and is oriented. He appears back to his baseline. Neurology recommending increasing Keppra to gram twice a day. Patient reports noncompliance with medication. He was able to ambulate without difficulty and eat. Will discharge home with follow-up at the [place].  After history, exam, and medical workup I feel the patient has been appropriately medically screened and is safe for discharge home. Pertinent diagnoses were discussed with the patient. Patient was given return precautions.  I personally performed the services described in this documentation, which was scribed in my presence. The recorded information has been reviewed and is accurate.    Miguel Hacker, MD 07/23/15 [phone]

## 2015-09-11 ENCOUNTER — Encounter (HOSPITAL_COMMUNITY): Payer: Self-pay | Admitting: Emergency Medicine

## 2015-09-11 ENCOUNTER — Observation Stay (HOSPITAL_COMMUNITY)
Admission: EM | Admit: 2015-09-11 | Discharge: 2015-09-13 | Disposition: A | Discharge status: Home / Self Care | Payer: Medicare Other | Attending: Internal Medicine | Admitting: Internal Medicine

## 2015-09-11 DIAGNOSIS — F329 Major depressive disorder, single episode, unspecified: Secondary | ICD-10-CM | POA: Diagnosis not present

## 2015-09-11 DIAGNOSIS — R569 Unspecified convulsions: Secondary | ICD-10-CM | POA: Diagnosis not present

## 2015-09-11 DIAGNOSIS — F1290 Cannabis use, unspecified, uncomplicated: Secondary | ICD-10-CM | POA: Diagnosis not present

## 2015-09-11 DIAGNOSIS — G9340 Encephalopathy, unspecified: Secondary | ICD-10-CM | POA: Diagnosis not present

## 2015-09-11 DIAGNOSIS — E876 Hypokalemia: Secondary | ICD-10-CM | POA: Diagnosis present

## 2015-09-11 DIAGNOSIS — F4310 Post-traumatic stress disorder, unspecified: Secondary | ICD-10-CM | POA: Diagnosis not present

## 2015-09-11 DIAGNOSIS — I4581 Long QT syndrome: Secondary | ICD-10-CM | POA: Diagnosis not present

## 2015-09-11 DIAGNOSIS — R410 Disorientation, unspecified: Secondary | ICD-10-CM

## 2015-09-11 DIAGNOSIS — F1910 Other psychoactive substance abuse, uncomplicated: Secondary | ICD-10-CM | POA: Diagnosis not present

## 2015-09-11 DIAGNOSIS — I10 Essential (primary) hypertension: Secondary | ICD-10-CM | POA: Diagnosis not present

## 2015-09-11 DIAGNOSIS — Z7982 Long term (current) use of aspirin: Secondary | ICD-10-CM | POA: Insufficient documentation

## 2015-09-11 DIAGNOSIS — G40909 Epilepsy, unspecified, not intractable, without status epilepticus: Principal | ICD-10-CM | POA: Insufficient documentation

## 2015-09-11 DIAGNOSIS — F1010 Alcohol abuse, uncomplicated: Secondary | ICD-10-CM | POA: Diagnosis present

## 2015-09-11 DIAGNOSIS — F17210 Nicotine dependence, cigarettes, uncomplicated: Secondary | ICD-10-CM | POA: Diagnosis not present

## 2015-09-11 DIAGNOSIS — Z6821 Body mass index (BMI) 21.0-21.9, adult: Secondary | ICD-10-CM | POA: Insufficient documentation

## 2015-09-11 DIAGNOSIS — E43 Unspecified severe protein-calorie malnutrition: Secondary | ICD-10-CM | POA: Diagnosis present

## 2015-09-11 NOTE — ED Notes (Signed)
Pt BIB [facility] after having an approx. 1 min. Seizure tonight witnessed by his son. Hx of same but has been out of meds since yesterday. Pt is alert and ambulatory at this time but not oriented. Post ictal on arrival.

## 2015-09-11 NOTE — ED Notes (Signed)
Bed: WA06 Expected date:  Expected time:  Means of arrival:  Comments: EMS 70yo M altered LOC / seizure

## 2015-09-12 ENCOUNTER — Emergency Department (HOSPITAL_COMMUNITY): Payer: Medicare Other

## 2015-09-12 ENCOUNTER — Encounter (HOSPITAL_COMMUNITY): Payer: Self-pay

## 2015-09-12 DIAGNOSIS — E876 Hypokalemia: Secondary | ICD-10-CM | POA: Diagnosis not present

## 2015-09-12 DIAGNOSIS — F1010 Alcohol abuse, uncomplicated: Secondary | ICD-10-CM | POA: Diagnosis not present

## 2015-09-12 DIAGNOSIS — R569 Unspecified convulsions: Secondary | ICD-10-CM

## 2015-09-12 DIAGNOSIS — G9340 Encephalopathy, unspecified: Secondary | ICD-10-CM

## 2015-09-12 LAB — URINALYSIS, ROUTINE W REFLEX MICROSCOPIC
Bilirubin Urine: NEGATIVE
GLUCOSE, UA: NEGATIVE mg/dL
HGB URINE DIPSTICK: NEGATIVE
Ketones, ur: NEGATIVE mg/dL
LEUKOCYTES UA: NEGATIVE
Nitrite: NEGATIVE
PROTEIN: NEGATIVE mg/dL
SPECIFIC GRAVITY, URINE: 1.011 (ref 1.005–1.030)
pH: 7 (ref 5.0–8.0)

## 2015-09-12 LAB — CBC WITH DIFFERENTIAL/PLATELET
Basophils Absolute: 0 10*3/uL (ref 0.0–0.1)
Basophils Relative: 1 %
Eosinophils Absolute: 0.5 10*3/uL (ref 0.0–0.7)
Eosinophils Relative: 6 %
HEMATOCRIT: 38.6 % — AB (ref 39.0–52.0)
HEMOGLOBIN: 14.2 g/dL (ref 13.0–17.0)
LYMPHS PCT: 41 %
Lymphs Abs: 3.4 10*3/uL (ref 0.7–4.0)
MCH: 34.8 pg — ABNORMAL HIGH (ref 26.0–34.0)
MCHC: 36.8 g/dL — ABNORMAL HIGH (ref 30.0–36.0)
MCV: 94.6 fL (ref 78.0–100.0)
MONO ABS: 0.7 10*3/uL (ref 0.1–1.0)
Monocytes Relative: 9 %
NEUTROS ABS: 3.6 10*3/uL (ref 1.7–7.7)
NEUTROS PCT: 44 %
Platelets: 131 10*3/uL — ABNORMAL LOW (ref 150–400)
RBC: 4.08 MIL/uL — AB (ref 4.22–5.81)
RDW: 13.9 % (ref 11.5–15.5)
WBC: 8.3 10*3/uL (ref 4.0–10.5)

## 2015-09-12 LAB — COMPREHENSIVE METABOLIC PANEL
ALK PHOS: 68 U/L (ref 38–126)
ALT: 24 U/L (ref 17–63)
AST: 30 U/L (ref 15–41)
Albumin: 4.3 g/dL (ref 3.5–5.0)
Anion gap: 9 (ref 5–15)
BILIRUBIN TOTAL: 0.8 mg/dL (ref 0.3–1.2)
BUN: 16 mg/dL (ref 6–20)
CO2: 24 mmol/L (ref 22–32)
Calcium: 9.3 mg/dL (ref 8.9–10.3)
Chloride: 102 mmol/L (ref 101–111)
Creatinine, Ser: 0.65 mg/dL (ref 0.61–1.24)
GFR calc Af Amer: >60 mL/min (ref >60)
Glucose, Bld: 121 mg/dL — ABNORMAL HIGH (ref 65–99)
Potassium: 3.1 mmol/L — ABNORMAL LOW (ref 3.5–5.1)
Sodium: 135 mmol/L (ref 135–145)
TOTAL PROTEIN: 7.6 g/dL (ref 6.5–8.1)

## 2015-09-12 LAB — ETHANOL: Alcohol, Ethyl (B): <5 mg/dL (ref <5)

## 2015-09-12 LAB — CBC
HCT: 39.4 % (ref 39.0–52.0)
Hemoglobin: 14.5 g/dL (ref 13.0–17.0)
MCH: 35.3 pg — ABNORMAL HIGH (ref 26.0–34.0)
MCHC: 36.8 g/dL — ABNORMAL HIGH (ref 30.0–36.0)
MCV: 95.9 fL (ref 78.0–100.0)
PLATELETS: 129 10*3/uL — AB (ref 150–400)
RBC: 4.11 MIL/uL — ABNORMAL LOW (ref 4.22–5.81)
RDW: 13.8 % (ref 11.5–15.5)
WBC: 9.1 10*3/uL (ref 4.0–10.5)

## 2015-09-12 LAB — RAPID URINE DRUG SCREEN, HOSP PERFORMED
AMPHETAMINES: NOT DETECTED
BARBITURATES: NOT DETECTED
BENZODIAZEPINES: NOT DETECTED
COCAINE: NOT DETECTED
Opiates: NOT DETECTED
Tetrahydrocannabinol: POSITIVE — AB

## 2015-09-12 LAB — CREATININE, SERUM: Creatinine, Ser: 0.62 mg/dL (ref 0.61–1.24)

## 2015-09-12 LAB — AMMONIA: Ammonia: 20 umol/L (ref 9–35)

## 2015-09-12 LAB — CBG MONITORING, ED: GLUCOSE-CAPILLARY: 128 mg/dL — AB (ref 65–99)

## 2015-09-12 LAB — MAGNESIUM: Magnesium: 1.5 mg/dL — ABNORMAL LOW (ref 1.7–2.4)

## 2015-09-12 MED ORDER — OXCARBAZEPINE 300 MG PO TABS
600.0000 mg | ORAL_TABLET | Freq: Two times a day (BID) | ORAL | Status: DC
  Administered 2015-09-12 – 2015-09-13 (×2): 600 mg via ORAL
  Filled 2015-09-12 (×3): qty 2

## 2015-09-12 MED ORDER — ADULT MULTIVITAMIN W/MINERALS CH
1.0000 | ORAL_TABLET | Freq: Every day | ORAL | Status: DC
  Administered 2015-09-13: 1 via ORAL
  Filled 2015-09-12: qty 1

## 2015-09-12 MED ORDER — ASPIRIN 325 MG PO TABS
325.0000 mg | ORAL_TABLET | Freq: Every day | ORAL | Status: DC
  Administered 2015-09-13: 325 mg via ORAL
  Filled 2015-09-12: qty 1

## 2015-09-12 MED ORDER — LORAZEPAM 2 MG/ML IJ SOLN: 1.0000 mg | Freq: Four times a day (QID) | INTRAMUSCULAR | Status: DC | PRN

## 2015-09-12 MED ORDER — LORAZEPAM 2 MG/ML IJ SOLN
0.0000 mg | Freq: Four times a day (QID) | INTRAMUSCULAR | Status: DC
  Administered 2015-09-12: 2 mg via INTRAVENOUS
  Filled 2015-09-12: qty 1

## 2015-09-12 MED ORDER — THIAMINE HCL 100 MG/ML IJ SOLN
100.0000 mg | Freq: Once | INTRAMUSCULAR | Status: AC
  Administered 2015-09-12: 100 mg via INTRAVENOUS
  Filled 2015-09-12: qty 2

## 2015-09-12 MED ORDER — POTASSIUM CHLORIDE 10 MEQ/100ML IV SOLN
10.0000 meq | INTRAVENOUS | Status: AC
  Administered 2015-09-12 (×2): 10 meq via INTRAVENOUS
  Filled 2015-09-12 (×2): qty 100

## 2015-09-12 MED ORDER — VITAMIN B-1 100 MG PO TABS
100.0000 mg | ORAL_TABLET | Freq: Every day | ORAL | Status: DC
  Administered 2015-09-13: 100 mg via ORAL
  Filled 2015-09-12: qty 1

## 2015-09-12 MED ORDER — SODIUM CHLORIDE 0.9 % IV SOLN: INTRAVENOUS | Status: AC

## 2015-09-12 MED ORDER — ENOXAPARIN SODIUM 40 MG/0.4ML ~~LOC~~ SOLN
40.0000 mg | SUBCUTANEOUS | Status: DC
  Administered 2015-09-12: 40 mg via SUBCUTANEOUS
  Filled 2015-09-12: qty 0.4

## 2015-09-12 MED ORDER — SODIUM CHLORIDE 0.9 % IV SOLN
1000.0000 mg | Freq: Two times a day (BID) | INTRAVENOUS | Status: DC
  Administered 2015-09-12 – 2015-09-13 (×3): 1000 mg via INTRAVENOUS
  Filled 2015-09-12 (×5): qty 10

## 2015-09-12 MED ORDER — LORAZEPAM 1 MG PO TABS: 1.0000 mg | ORAL_TABLET | Freq: Four times a day (QID) | ORAL | Status: DC | PRN

## 2015-09-12 MED ORDER — SODIUM CHLORIDE 0.9% FLUSH
3.0000 mL | Freq: Two times a day (BID) | INTRAVENOUS | Status: DC
  Administered 2015-09-12: 3 mL via INTRAVENOUS

## 2015-09-12 MED ORDER — LORAZEPAM 2 MG/ML IJ SOLN: 0.0000 mg | Freq: Two times a day (BID) | INTRAMUSCULAR | Status: DC

## 2015-09-12 MED ORDER — FOLIC ACID 1 MG PO TABS
1.0000 mg | ORAL_TABLET | Freq: Every day | ORAL | Status: DC
  Administered 2015-09-13: 1 mg via ORAL
  Filled 2015-09-12: qty 1

## 2015-09-12 MED ORDER — ATORVASTATIN CALCIUM 40 MG PO TABS
40.0000 mg | ORAL_TABLET | Freq: Every day | ORAL | Status: DC
  Administered 2015-09-12 – 2015-09-13 (×2): 40 mg via ORAL
  Filled 2015-09-12 (×2): qty 1

## 2015-09-12 MED ORDER — POTASSIUM CHLORIDE IN NACL 20-0.9 MEQ/L-% IV SOLN
INTRAVENOUS | Status: AC
  Administered 2015-09-12 (×2): via INTRAVENOUS
  Filled 2015-09-12 (×2): qty 1000

## 2015-09-12 MED ORDER — METOPROLOL TARTRATE 12.5 MG HALF TABLET
12.5000 mg | ORAL_TABLET | Freq: Two times a day (BID) | ORAL | Status: DC
  Administered 2015-09-12 – 2015-09-13 (×3): 12.5 mg via ORAL
  Filled 2015-09-12 (×3): qty 1

## 2015-09-12 MED ORDER — SODIUM CHLORIDE 0.9 % IV SOLN
1000.0000 mg | Freq: Once | INTRAVENOUS | Status: AC
  Administered 2015-09-12: 1000 mg via INTRAVENOUS
  Filled 2015-09-12: qty 10

## 2015-09-12 NOTE — H&P (Signed)
History and Physical  Miguel Hanson [id number] DOB: 03/02/1946 DOA: 09/11/2015  Referring physician: EDP PCP: [facility]   Chief Complaint:  Recurrent seizure  HPI: Miguel Hanson is a 70 y.o. male   With h/o PTSD, HtN, polysubstance abuse, alcohol, recurrent seizure is sent to [facility] due to seizure x13mins witnessed by his son during the night. Son reports that patient has run out of his seizure meds, upon arrival to ED, patient is post ictal, incoherent, word incomprehensible. Vital stable. No fever, no leukocytosis, lft, cr, ammonia level wnl, uds + THC, alcohol level less than 5. He is loaded with keppra. Neurology consulted by EDP who recommended admit to [facility], hospitalist called to admit the patient.  Review of Systems:  Detail per HPI, Review of systems are otherwise negative  Past Medical History  Diagnosis Date  . Hypertension   . Seizures ([place])   . ETOH abuse   . Drug abuse   . PTSD (post-traumatic stress disorder)   . H/O prolonged Q-T interval on ECG   . Polysubstance abuse   . Alcohol abuse   . Depression    Past Surgical History  Procedure Laterality Date  . No past surgeries     Social History:  reports that he has been smoking Cigarettes.  He has a 24.5 pack-year smoking history. He uses smokeless tobacco. He reports that he drinks about 21.6 oz of alcohol per week. He reports that he uses illicit drugs (Marijuana). Patient lives at home & is able to participate in activities of daily living independently   No Known Allergies  Family History  Problem Relation Age of Onset  . Hypertension Mother   . Hypertension Father       Prior to Admission medications   Medication Sig Start Date End Date Taking? Authorizing Provider  aspirin 325 MG tablet Take 1 tablet (325 mg total) by mouth daily. 12/31/14   Delfina Redwood, MD  atorvastatin (LIPITOR) 40 MG tablet Take 1 tablet (40 mg total) by mouth daily at 6 PM. 12/31/14   Delfina Redwood, MD  docusate sodium (COLACE) 100 MG capsule Take 1 capsule (100 mg total) by mouth 2 (two) times daily. 06/30/15   Maryann Mikhail, DO  feeding supplement, ENSURE ENLIVE, (ENSURE ENLIVE) LIQD Take 237 mLs by mouth 3 (three) times daily between meals. 06/30/15   Maryann Mikhail, DO  folic acid (FOLVITE) 1 MG tablet Take 1 tablet (1 mg total) by mouth daily. 06/30/15   Maryann Mikhail, DO  levETIRAcetam (KEPPRA) 1000 MG tablet Take 1 tablet (1,000 mg total) by mouth 2 (two) times daily. 07/23/15   [name], MD  metoprolol tartrate (LOPRESSOR) 25 MG tablet Take 0.5 tablets (12.5 mg total) by mouth 2 (two) times daily. 06/30/15   Maryann Mikhail, DO  Multiple Vitamin (MULTIVITAMIN WITH MINERALS) TABS tablet Take 1 tablet by mouth daily. 06/30/15   Maryann Mikhail, DO  oxcarbazepine (TRILEPTAL) 600 MG tablet Take 1 tablet (600 mg total) by mouth 2 (two) times daily. 07/23/15   [name], MD  thiamine 100 MG tablet Take 1 tablet (100 mg total) by mouth daily. 06/30/15   Maryann Mikhail, DO    Physical Exam: BP 141/80 mmHg  Pulse 84  Temp(Src) 98.3 F (36.8 C) (Oral)  Resp 16  SpO2 99%  General:  Confused, does attempt to follow commands Eyes: PERRL ENT: unremarkable Neck: supple, no JVD Cardiovascular: RRR Respiratory: CTABL Abdomen: soft/ND/ND, positive bowel sounds Skin: no rash  Musculoskeletal:  No edema Psychiatric: calm/cooperative Neurologic: no focal findings            Labs on Admission:  Basic Metabolic Panel:  Recent Labs Lab 09/12/15 0003  NA 135  K 3.1*  CL 102  CO2 24  GLUCOSE 121*  BUN 16  CREATININE 0.65  CALCIUM 9.3   Liver Function Tests:  Recent Labs Lab 09/12/15 0003  AST 30  ALT 24  ALKPHOS 68  BILITOT 0.8  PROT 7.6  ALBUMIN 4.3   No results for input(s): LIPASE, AMYLASE in the last 168 hours.  Recent Labs Lab 09/12/15 0443  AMMONIA 20   CBC:  Recent Labs Lab 09/12/15 0003  WBC 8.3  NEUTROABS 3.6  HGB 14.2    HCT 38.6*  MCV 94.6  PLT 131*   Cardiac Enzymes: No results for input(s): CKTOTAL, CKMB, CKMBINDEX, TROPONINI in the last 168 hours.  BNP (last 3 results) No results for input(s): BNP in the last 8760 hours.  ProBNP (last 3 results) No results for input(s): PROBNP in the last 8760 hours.  CBG:  Recent Labs Lab 09/12/15 0019  GLUCAP 128*    Radiological Exams on Admission: Ct Head Wo Contrast  09/12/2015  CLINICAL DATA:  70 year old male with seizures EXAM: CT HEAD WITHOUT CONTRAST TECHNIQUE: Contiguous axial images were obtained from the base of the skull through the vertex without intravenous contrast. COMPARISON:  Head CT dated 07/23/2015 FINDINGS: There is mild age-related atrophy and chronic microvascular ischemic changes. There is no acute intracranial hemorrhage. No mass effect or midline shift noted. There is opacification of the left frontal sinus. No air-fluid level. The remainder of the visualized paranasal sinuses and mastoid air cells are clear. The calvarium is intact. There is chronic fracture of the nasal bridge with deviation of the nose to the right. The calvarium is intact. IMPRESSION: No acute intracranial hemorrhage. Mild age-related atrophy and chronic microvascular ischemic disease. If symptoms persist and there are no contraindications, MRI may provide better evaluation if clinically indicated Electronically Signed   By: Anner Crete [name]   On: 09/12/2015 01:51    EKG: Independently reviewed. Sinus rhythm, first degree av block, QTc 477, no acute st/t changes  Assessment/Plan Present on Admission:  **None**   Recurrent seizure, likely from medication noncompliance +/- alcohol withdrawal? No infection identified. CT head no acute findings, ammonia level wnl. keppra level pending, He is loaded with iv keppra, continue trileptal. Seizure and aspiration precaution. Neurology consulted.  Confusion: likely combination of post ictal state, alcohol withdrawal,  wernicke's/korsakoff. On folic acid /thiamin/multivitamins  Hypokalemia: k 3.1, replace, check mag  Alcohol abuse, last drink unknown , on CIWA protocol.  HTN; continue low dose lopressor   DVT prophylaxis: lovenox  Consultants: neurology consulted by EDP  Code Status: full   Family Communication:  Patient   Disposition Plan: admit to [facility] stepdown per neurology request  Time spent: 48mins  Lorne Winkels MD, PhD [facility] Pager [phone] If 7PM-7AM, please contact night-coverage at [email or web address], password [facility]

## 2015-09-12 NOTE — ED Provider Notes (Signed)
  Physical Exam  BP 141/80 mmHg  Pulse 84  Temp(Src) 98.3 F (36.8 C) (Oral)  Resp 16  SpO2 99%  Physical Exam  ED Course  Procedures  MDM  Pt with hx of alcohol abuse and hx of seizures. He had witnessed seizure prior to ER arrival. Pt is still not at baseline mentation - pt is alert, but not oriented. He is following simple commands. No fevers. No seizures here. Ct head is neg. Keppra loaded. No concerns for infection.  Awaiting step down bed.     Varney Biles, MD 09/12/15 0800

## 2015-09-12 NOTE — Progress Notes (Signed)
Pt admitted from [facility] ED via carelink. [facility] admit text paged.

## 2015-09-12 NOTE — ED Notes (Signed)
Attempted in & out cath without success.

## 2015-09-13 DIAGNOSIS — E876 Hypokalemia: Secondary | ICD-10-CM | POA: Diagnosis present

## 2015-09-13 DIAGNOSIS — G9340 Encephalopathy, unspecified: Secondary | ICD-10-CM | POA: Diagnosis not present

## 2015-09-13 DIAGNOSIS — F1010 Alcohol abuse, uncomplicated: Secondary | ICD-10-CM | POA: Diagnosis not present

## 2015-09-13 DIAGNOSIS — I10 Essential (primary) hypertension: Secondary | ICD-10-CM | POA: Diagnosis not present

## 2015-09-13 DIAGNOSIS — E43 Unspecified severe protein-calorie malnutrition: Secondary | ICD-10-CM | POA: Diagnosis not present

## 2015-09-13 DIAGNOSIS — F4310 Post-traumatic stress disorder, unspecified: Secondary | ICD-10-CM | POA: Diagnosis not present

## 2015-09-13 DIAGNOSIS — R569 Unspecified convulsions: Secondary | ICD-10-CM | POA: Diagnosis not present

## 2015-09-13 DIAGNOSIS — G40909 Epilepsy, unspecified, not intractable, without status epilepticus: Secondary | ICD-10-CM | POA: Diagnosis not present

## 2015-09-13 LAB — COMPREHENSIVE METABOLIC PANEL
ALT: 16 U/L — ABNORMAL LOW (ref 17–63)
AST: 19 U/L (ref 15–41)
Albumin: 3.1 g/dL — ABNORMAL LOW (ref 3.5–5.0)
Alkaline Phosphatase: 44 U/L (ref 38–126)
Anion gap: 8 (ref 5–15)
CALCIUM: 8.3 mg/dL — AB (ref 8.9–10.3)
CO2: 23 mmol/L (ref 22–32)
CREATININE: 0.71 mg/dL (ref 0.61–1.24)
Chloride: 104 mmol/L (ref 101–111)
GFR calc non Af Amer: >60 mL/min (ref >60)
GLUCOSE: 87 mg/dL (ref 65–99)
Potassium: 4.3 mmol/L (ref 3.5–5.1)
SODIUM: 135 mmol/L (ref 135–145)
TOTAL PROTEIN: 6.1 g/dL — AB (ref 6.5–8.1)
Total Bilirubin: 1 mg/dL (ref 0.3–1.2)

## 2015-09-13 LAB — CBC
HCT: 37.5 % — ABNORMAL LOW (ref 39.0–52.0)
HEMOGLOBIN: 13.6 g/dL (ref 13.0–17.0)
MCH: 34.9 pg — AB (ref 26.0–34.0)
MCHC: 36.3 g/dL — AB (ref 30.0–36.0)
MCV: 96.2 fL (ref 78.0–100.0)
PLATELETS: 119 10*3/uL — AB (ref 150–400)
RBC: 3.9 MIL/uL — ABNORMAL LOW (ref 4.22–5.81)
RDW: 14 % (ref 11.5–15.5)
WBC: 8.9 10*3/uL (ref 4.0–10.5)

## 2015-09-13 LAB — MAGNESIUM: MAGNESIUM: 1.5 mg/dL — AB (ref 1.7–2.4)

## 2015-09-13 MED ORDER — ATORVASTATIN CALCIUM 40 MG PO TABS: 40.0000 mg | ORAL_TABLET | Freq: Every day | ORAL | Status: AC

## 2015-09-13 MED ORDER — LEVETIRACETAM 1000 MG PO TABS: 1000.0000 mg | ORAL_TABLET | Freq: Two times a day (BID) | ORAL | Status: DC

## 2015-09-13 MED ORDER — FOLIC ACID 1 MG PO TABS: 1.0000 mg | ORAL_TABLET | Freq: Every day | ORAL | Status: AC

## 2015-09-13 MED ORDER — MAGNESIUM SULFATE 4 GM/100ML IV SOLN
4.0000 g | Freq: Once | INTRAVENOUS | Status: AC
  Administered 2015-09-13: 4 g via INTRAVENOUS
  Filled 2015-09-13: qty 100

## 2015-09-13 MED ORDER — METOPROLOL TARTRATE 25 MG PO TABS: 12.5000 mg | ORAL_TABLET | Freq: Two times a day (BID) | ORAL | Status: AC

## 2015-09-13 MED ORDER — OXCARBAZEPINE 600 MG PO TABS: 600.0000 mg | ORAL_TABLET | Freq: Two times a day (BID) | ORAL | Status: DC

## 2015-09-13 MED ORDER — LEVETIRACETAM 500 MG PO TABS: 1000.0000 mg | ORAL_TABLET | Freq: Two times a day (BID) | ORAL | Status: DC

## 2015-09-13 NOTE — Care Management Note (Addendum)
Case Management Note  Patient Details  Name: Miguel Hanson MRN: [id number] Date of Birth: 31-Jan-1946  Subjective/Objective:                 Follow up with Dr Criss Rosales for PCP. Entered in AVS. Patient from home with wife. Wife has MS and goes to [facility]. Patient gets transportation form family members. Has walker. Insured through [facility]. [facility] Call niece Dorien Chihuahua [phone] or cell [phone] for transportation home at [place].    Action/Plan:  [place] referral made to [facility] for RN and CSW Expected Discharge Date:   (UNKNOWN)               Expected Discharge Plan:  Home/Self Care  In-House Referral:     Discharge planning Services  CM Consult, Follow-up appt scheduled  Post Acute Care Choice:  NA Choice offered to:  NA  DME Arranged:    DME Agency:     HH Arranged:    HH Agency:     Status of Service:  Completed, signed off  If discussed at [employer] of Stay Meetings, dates discussed:    Additional Comments:  Carles Collet, RN 09/13/2015, 2:57 PM

## 2015-09-13 NOTE — Care Management CC44 (Signed)
Condition Code 44 Documentation Completed  Patient Details  Name: Miguel Hanson MRN: [id number] Date of Birth: 10-13-1945   Condition Code 44 given:  Yes Patient signature on Condition Code 44 notice:  Yes Documentation of 2 MD's agreement:  Yes Code 44 added to claim:  Yes    Carles Collet, RN 09/13/2015, 2:56 PM

## 2015-09-13 NOTE — Progress Notes (Deleted)
Referral made to [facility] for home health

## 2015-09-13 NOTE — Progress Notes (Signed)
Subjective:  Resting in bed, appears stable.  Answering questions.  Exam: Filed Vitals:   09/12/15 2351 09/13/15 0630  BP: 135/88 147/87  Pulse: 82 83  Temp: 97.8 F (36.6 C) 98.4 F (36.9 C)  Resp: 18 16    HEENT-  Normocephalic, no lesions, without obvious abnormality.  Normal external eye and conjunctiva. Normal external nose, mucus membranes and septum.  Normal pharynx. Cardiovascular- RRR Lungs- clear Musculoskeletal- wnl Gen: In bed, NAD MS: Confused and disoriented, attempts to follow commands CN: I-XII intact Motor: MAE Sensory:grossly intact Tremor on finger to nose testing  Pertinent Labs/Diagnostics: reviewed  Impression:   Miguel Hanson appears stable and without seizure activity at present.  It is unclear in his confusion represents his baseline or if there is a superimposed component of encephalopathy.  There is no seizure activity at present.   Recommendations: 1) Follow up outpatient with neurology   Jeneen Rinks A. Tasia Catchings, [name] Neurohospitalist Phone: [phone]

## 2015-09-13 NOTE — Progress Notes (Signed)
Pt still sleeping through iv ativan scheduled at [place] yesterday, scheduled 0600  Iv ativan not given

## 2015-09-13 NOTE — Progress Notes (Signed)
Pt disoriented and confuse not new was in that state before he came for admission, pt education not done due to his mental status, still sleeping through the iv ativan that was given scheduled at [place] therefore the scheduled ativan at 0000 was not given will continue to monitor

## 2015-09-13 NOTE — Progress Notes (Signed)
Spoke with son Quillian Quince on the phone. He confirmed that patient lives at home with wife. He has access to walker. He usually does not need assitance walking. He goes to [facility] for psychologist for PTSD and ETOH abuse management. Informed son of follow up appointments and plans for discharge today. Patient's sister Orbie Hurst also aware.

## 2015-09-13 NOTE — Discharge Summary (Signed)
Physician Discharge Summary  Miguel Hanson [id number] DOB: 10/29/1945 DOA: 09/11/2015  PCP: [place] date: 09/11/2015 Discharge date: 09/13/2015  Time spent: 65 minutes  Recommendations for Outpatient Follow-up:  1. Patient will be discharged home with home health RN and social worker. Follow-up with Dr. Beatrix Shipper, 09/18/2015 to be established with a new PCP. Patient will need a basic metabolic profile done as well as a magnesium level checked to follow-up on electrolytes and renal function. 2. Follow-up with Dr. Delice Lesch, neurology 10/24/2015. For follow-up on recurrent seizures.   Discharge Diagnoses:  Principal Problem:   Seizure ([place]) Active Problems:   Alcohol abuse   PTSD (post-traumatic stress disorder)   Protein-calorie malnutrition, severe (HCC)   Acute encephalopathy   Essential hypertension   ETOH abuse   Hypokalemia   Hypomagnesemia   Discharge Condition: Stable and improved  Diet recommendation: Regular  Filed Weights   09/12/15 1842  Weight: 56.609 kg (124 lb 12.8 oz)    History of present illness:  Per Dr Juanda Chance is a 70 y.o. male   With h/o PTSD, HtN, polysubstance abuse, alcohol, recurrent seizure was sent to [facility] due to seizure x76mins witnessed by his son during the night. Son reported that patient had run out of his seizure meds, upon arrival to ED, patient was post ictal, incoherent, word incomprehensible. Vital stable. No fever, no leukocytosis, lft, cr, ammonia level wnl, uds + THC, alcohol level less than 5. He was loaded with keppra. Neurology consulted by EDP who recommended admit to [facility], hospitalist called to admit the patient.   Hospital Course:  #1 recurrent seizures Patient had presented with recurrent seizures witnessed by patient's son patient noted to be postictal on admission. It was felt patient's recurrent seizures were likely secondary to medication noncompliance plus or minus alcohol withdrawal.  CT head which was done was unremarkable. Patient had no signs or symptoms of infection. Patient had no arrest artery symptoms. Urinalysis was unremarkable. Patient remained afebrile. Patient was noted to be hypokalemic and hypomagnesemic on both repleted. Neurology was consulted and patient was seen in consultation by Dr. Tasia Catchings on 09/13/2015. Patient on admission was given a Keppra loading dose. On home regimen of Trileptal 600 mg twice daily. Patient's home dose Keppra 1000 mg twice daily was also resumed and patient placed on Ativan as needed. Patient did not have any further seizures. It was recommended that patient follow-up with outpatient neurology. Patient has been given prescriptions for all his anticonvulsant medications and hospital follow-up with outpatient neurology has been set up with Dr. Delice Lesch. Patient be discharged in stable and improved condition.  #2 hypokalemia/hypomagnesemia Repleted.  #3 acute encephalopathy On admission patient was noted to be confused and it was felt this was likely multifactorial secondary to postictal state, I'll call withdrawal, probable Wernicke-Korsakoff's. Patient was placed on folic acid, multivitamin, thiamine. Patient improved clinically however baseline unknown. Patient be discharged home in stable and improved condition and is to follow-up with PCP and neurology and outpatient setting.  #4 history of alcohol abuse Patient did not go through any DTs during the hospitalization. Patient was maintained on the Ativan withdrawal protocol.  #5 hypertension Remained stable. Patient can continued on home regimen of Lopressor.  #6 severe protein calorie malnutrition Patient was placed on nutritional supplementation.  Procedures:  CT head 09/12/2015    Consultations:  Neurology: Dr. Tasia Catchings 09/13/2015  Discharge Exam: Filed Vitals:   09/13/15 0630 09/13/15 1207  BP: 147/87 135/85  Pulse:  83 81  Temp: 98.4 F (36.9 C) 98.4 F (36.9 C)   Resp: 16 18    General: NAD Cardiovascular: RRR Respiratory: CTAB  Discharge Instructions   Discharge Instructions    Diet general    Complete by:  As directed      Discharge instructions    Complete by:  As directed   Follow up with Dr Criss Rosales as scheduled to establish PCP. Follow up with Dr Delice Lesch, neurology on 10/24/2015. No driving.     Increase activity slowly    Complete by:  As directed           Current Discharge Medication List    CONTINUE these medications which have CHANGED   Details  atorvastatin (LIPITOR) 40 MG tablet Take 1 tablet (40 mg total) by mouth daily at 6 PM. Qty: 30 tablet, Refills: 1    folic acid (FOLVITE) 1 MG tablet Take 1 tablet (1 mg total) by mouth daily. Qty: 30 tablet, Refills: 0    levETIRAcetam (KEPPRA) 1000 MG tablet Take 1 tablet (1,000 mg total) by mouth 2 (two) times daily. Qty: 60 tablet, Refills: 3    metoprolol tartrate (LOPRESSOR) 25 MG tablet Take 0.5 tablets (12.5 mg total) by mouth 2 (two) times daily. Qty: 60 tablet, Refills: 0    oxcarbazepine (TRILEPTAL) 600 MG tablet Take 1 tablet (600 mg total) by mouth 2 (two) times daily. Qty: 60 tablet, Refills: 3      CONTINUE these medications which have NOT CHANGED   Details  aspirin 325 MG tablet Take 1 tablet (325 mg total) by mouth daily.    docusate sodium (COLACE) 100 MG capsule Take 1 capsule (100 mg total) by mouth 2 (two) times daily. Qty: 10 capsule, Refills: 0    feeding supplement, ENSURE ENLIVE, (ENSURE ENLIVE) LIQD Take 237 mLs by mouth 3 (three) times daily between meals. Qty: 90 Bottle, Refills: 0    Multiple Vitamin (MULTIVITAMIN WITH MINERALS) TABS tablet Take 1 tablet by mouth daily. Qty: 30 tablet, Refills: 0    thiamine 100 MG tablet Take 1 tablet (100 mg total) by mouth daily. Qty: 30 tablet, Refills: 0       No Known Allergies Follow-up Information    Follow up with Elyn Peers, MD. Go on 09/18/2015.   Specialty:  Family Medicine   Why:  at  2:50. Please bring medication bottles, picture ID, and insurance cards. This appointment will establish you with a primary care doctor.   Contact information:   [address] [place] [address] [phone]       Follow up with Cameron Sprang, MD On 10/24/2015.   Specialty:  Neurology   Why:  f/u at 115pm   Contact information:   [address] [place] [address] [phone]       Follow up with [facility].   Why:  for home health rn and social worker   Contact information:   [address] [place] [address] [phone]        The results of significant diagnostics from this hospitalization (including imaging, microbiology, ancillary and laboratory) are listed below for reference.    Significant Diagnostic Studies: Ct Head Wo Contrast  09/12/2015  CLINICAL DATA:  70 year old male with seizures EXAM: CT HEAD WITHOUT CONTRAST TECHNIQUE: Contiguous axial images were obtained from the base of the skull through the vertex without intravenous contrast. COMPARISON:  Head CT dated 07/23/2015 FINDINGS: There is mild age-related  atrophy and chronic microvascular ischemic changes. There is no acute intracranial hemorrhage. No mass effect or midline shift noted. There is opacification of the left frontal sinus. No air-fluid level. The remainder of the visualized paranasal sinuses and mastoid air cells are clear. The calvarium is intact. There is chronic fracture of the nasal bridge with deviation of the nose to the right. The calvarium is intact. IMPRESSION: No acute intracranial hemorrhage. Mild age-related atrophy and chronic microvascular ischemic disease. If symptoms persist and there are no contraindications, MRI may provide better evaluation if clinically indicated Electronically Signed   By: Anner Crete [name]   On: 09/12/2015 01:51    Microbiology: No results found for this or any previous visit (from the past 240 hour(s)).    Labs: Basic Metabolic Panel:  Recent Labs Lab 09/12/15 0003 09/12/15 0945 09/12/15 1838 09/13/15 0843  NA 135  --   --  135  K 3.1*  --   --  4.3  CL 102  --   --  104  CO2 24  --   --  23  GLUCOSE 121*  --   --  87  BUN 16  --   --  <5*  CREATININE 0.65  --  0.62 0.71  CALCIUM 9.3  --   --  8.3*  MG  --  1.5*  --  1.5*   Liver Function Tests:  Recent Labs Lab 09/12/15 0003 09/13/15 0843  AST 30 19  ALT 24 16*  ALKPHOS 68 44  BILITOT 0.8 1.0  PROT 7.6 6.1*  ALBUMIN 4.3 3.1*   No results for input(s): LIPASE, AMYLASE in the last 168 hours.  Recent Labs Lab 09/12/15 0443  AMMONIA 20   CBC:  Recent Labs Lab 09/12/15 0003 09/12/15 1838 09/13/15 0843  WBC 8.3 9.1 8.9  NEUTROABS 3.6  --   --   HGB 14.2 14.5 13.6  HCT 38.6* 39.4 37.5*  MCV 94.6 95.9 96.2  PLT 131* 129* 119*   Cardiac Enzymes: No results for input(s): CKTOTAL, CKMB, CKMBINDEX, TROPONINI in the last 168 hours. BNP: BNP (last 3 results) No results for input(s): BNP in the last 8760 hours.  ProBNP (last 3 results) No results for input(s): PROBNP in the last 8760 hours.  CBG:  Recent Labs Lab 09/12/15 0019  GLUCAP 128*       Signed:  THOMPSON,DANIEL MD.  [facility] 09/13/2015, 5:39 PM

## 2015-09-15 LAB — LEVETIRACETAM LEVEL: LEVETIRACETAM: 15.7 ug/mL (ref 10.0–40.0)

## 2015-09-18 DIAGNOSIS — E43 Unspecified severe protein-calorie malnutrition: Secondary | ICD-10-CM | POA: Diagnosis not present

## 2015-09-18 DIAGNOSIS — R561 Post traumatic seizures: Secondary | ICD-10-CM | POA: Diagnosis not present

## 2015-09-18 DIAGNOSIS — Z7142 Counseling for family member of alcoholic: Secondary | ICD-10-CM | POA: Diagnosis not present

## 2015-09-18 DIAGNOSIS — G40409 Other generalized epilepsy and epileptic syndromes, not intractable, without status epilepticus: Secondary | ICD-10-CM | POA: Diagnosis not present

## 2015-10-03 DIAGNOSIS — Z7142 Counseling for family member of alcoholic: Secondary | ICD-10-CM | POA: Diagnosis not present

## 2015-10-03 DIAGNOSIS — E782 Mixed hyperlipidemia: Secondary | ICD-10-CM | POA: Diagnosis not present

## 2015-10-03 DIAGNOSIS — G40409 Other generalized epilepsy and epileptic syndromes, not intractable, without status epilepticus: Secondary | ICD-10-CM | POA: Diagnosis not present

## 2015-10-03 DIAGNOSIS — Z6821 Body mass index (BMI) 21.0-21.9, adult: Secondary | ICD-10-CM | POA: Diagnosis not present

## 2015-10-24 ENCOUNTER — Ambulatory Visit: Payer: Medicare Other | Admitting: Neurology

## 2015-12-25 ENCOUNTER — Ambulatory Visit: Payer: Medicare Other | Admitting: Neurology

## 2016-01-20 DIAGNOSIS — E782 Mixed hyperlipidemia: Secondary | ICD-10-CM | POA: Diagnosis not present

## 2016-01-20 DIAGNOSIS — N189 Chronic kidney disease, unspecified: Secondary | ICD-10-CM | POA: Diagnosis not present

## 2016-04-20 DIAGNOSIS — I10 Essential (primary) hypertension: Secondary | ICD-10-CM | POA: Diagnosis not present

## 2016-04-20 DIAGNOSIS — Z0000 Encounter for general adult medical examination without abnormal findings: Secondary | ICD-10-CM | POA: Diagnosis not present

## 2016-04-20 DIAGNOSIS — E782 Mixed hyperlipidemia: Secondary | ICD-10-CM | POA: Diagnosis not present

## 2016-06-01 DIAGNOSIS — E782 Mixed hyperlipidemia: Secondary | ICD-10-CM | POA: Diagnosis not present

## 2016-06-01 DIAGNOSIS — E43 Unspecified severe protein-calorie malnutrition: Secondary | ICD-10-CM | POA: Diagnosis not present

## 2016-06-01 DIAGNOSIS — I10 Essential (primary) hypertension: Secondary | ICD-10-CM | POA: Diagnosis not present

## 2016-06-10 ENCOUNTER — Ambulatory Visit
Admission: RE | Admit: 2016-06-10 | Discharge: 2016-06-10 | Disposition: A | Discharge status: Home / Self Care | Payer: Medicare Other | Source: Ambulatory Visit | Attending: Family Medicine | Admitting: Family Medicine

## 2016-06-10 ENCOUNTER — Other Ambulatory Visit: Payer: Self-pay | Admitting: Family Medicine

## 2016-06-10 DIAGNOSIS — R0789 Other chest pain: Secondary | ICD-10-CM | POA: Diagnosis not present

## 2016-06-10 DIAGNOSIS — R0781 Pleurodynia: Secondary | ICD-10-CM

## 2016-07-06 ENCOUNTER — Encounter (HOSPITAL_COMMUNITY): Payer: Self-pay | Admitting: Emergency Medicine

## 2016-07-06 ENCOUNTER — Inpatient Hospital Stay (HOSPITAL_COMMUNITY)
Admission: EM | Admit: 2016-07-06 | Discharge: 2016-07-08 | DRG: 100 | Disposition: A | Discharge status: Home Health | Payer: Medicare Other | Attending: Student in an Organized Health Care Education/Training Program | Admitting: Student in an Organized Health Care Education/Training Program

## 2016-07-06 DIAGNOSIS — G40909 Epilepsy, unspecified, not intractable, without status epilepticus: Principal | ICD-10-CM | POA: Diagnosis present

## 2016-07-06 DIAGNOSIS — R9401 Abnormal electroencephalogram [EEG]: Secondary | ICD-10-CM | POA: Diagnosis not present

## 2016-07-06 DIAGNOSIS — F4310 Post-traumatic stress disorder, unspecified: Secondary | ICD-10-CM | POA: Diagnosis present

## 2016-07-06 DIAGNOSIS — Z9114 Patient's other noncompliance with medication regimen: Secondary | ICD-10-CM

## 2016-07-06 DIAGNOSIS — J449 Chronic obstructive pulmonary disease, unspecified: Secondary | ICD-10-CM | POA: Diagnosis not present

## 2016-07-06 DIAGNOSIS — E861 Hypovolemia: Secondary | ICD-10-CM | POA: Diagnosis not present

## 2016-07-06 DIAGNOSIS — E876 Hypokalemia: Secondary | ICD-10-CM | POA: Diagnosis not present

## 2016-07-06 DIAGNOSIS — D696 Thrombocytopenia, unspecified: Secondary | ICD-10-CM | POA: Diagnosis present

## 2016-07-06 DIAGNOSIS — R4701 Aphasia: Secondary | ICD-10-CM | POA: Diagnosis present

## 2016-07-06 DIAGNOSIS — F17210 Nicotine dependence, cigarettes, uncomplicated: Secondary | ICD-10-CM | POA: Diagnosis present

## 2016-07-06 DIAGNOSIS — F1010 Alcohol abuse, uncomplicated: Secondary | ICD-10-CM | POA: Diagnosis present

## 2016-07-06 DIAGNOSIS — F1910 Other psychoactive substance abuse, uncomplicated: Secondary | ICD-10-CM | POA: Diagnosis present

## 2016-07-06 DIAGNOSIS — E43 Unspecified severe protein-calorie malnutrition: Secondary | ICD-10-CM

## 2016-07-06 DIAGNOSIS — E871 Hypo-osmolality and hyponatremia: Secondary | ICD-10-CM | POA: Diagnosis not present

## 2016-07-06 DIAGNOSIS — Z8659 Personal history of other mental and behavioral disorders: Secondary | ICD-10-CM

## 2016-07-06 DIAGNOSIS — I110 Hypertensive heart disease with heart failure: Secondary | ICD-10-CM | POA: Diagnosis not present

## 2016-07-06 DIAGNOSIS — Z6821 Body mass index (BMI) 21.0-21.9, adult: Secondary | ICD-10-CM

## 2016-07-06 DIAGNOSIS — R404 Transient alteration of awareness: Secondary | ICD-10-CM | POA: Diagnosis not present

## 2016-07-06 DIAGNOSIS — Z9119 Patient's noncompliance with other medical treatment and regimen: Secondary | ICD-10-CM

## 2016-07-06 DIAGNOSIS — I10 Essential (primary) hypertension: Secondary | ICD-10-CM | POA: Diagnosis present

## 2016-07-06 DIAGNOSIS — Z8249 Family history of ischemic heart disease and other diseases of the circulatory system: Secondary | ICD-10-CM

## 2016-07-06 DIAGNOSIS — R6889 Other general symptoms and signs: Secondary | ICD-10-CM | POA: Diagnosis not present

## 2016-07-06 DIAGNOSIS — I5032 Chronic diastolic (congestive) heart failure: Secondary | ICD-10-CM | POA: Diagnosis not present

## 2016-07-06 DIAGNOSIS — G9340 Encephalopathy, unspecified: Secondary | ICD-10-CM | POA: Diagnosis not present

## 2016-07-06 DIAGNOSIS — R4182 Altered mental status, unspecified: Secondary | ICD-10-CM

## 2016-07-06 LAB — BASIC METABOLIC PANEL
ANION GAP: 9 (ref 5–15)
BUN: <5 mg/dL — ABNORMAL LOW (ref 6–20)
CHLORIDE: 86 mmol/L — AB (ref 101–111)
CO2: 30 mmol/L (ref 22–32)
Calcium: 8.7 mg/dL — ABNORMAL LOW (ref 8.9–10.3)
Creatinine, Ser: 0.64 mg/dL (ref 0.61–1.24)
GFR calc non Af Amer: >60 mL/min (ref >60)
Glucose, Bld: 107 mg/dL — ABNORMAL HIGH (ref 65–99)
POTASSIUM: 3.2 mmol/L — AB (ref 3.5–5.1)
SODIUM: 125 mmol/L — AB (ref 135–145)

## 2016-07-06 LAB — COMPREHENSIVE METABOLIC PANEL
ALT: 26 U/L (ref 17–63)
AST: 60 U/L — ABNORMAL HIGH (ref 15–41)
Albumin: 3.9 g/dL (ref 3.5–5.0)
Alkaline Phosphatase: 61 U/L (ref 38–126)
Anion gap: 11 (ref 5–15)
BUN: <5 mg/dL — ABNORMAL LOW (ref 6–20)
CO2: 29 mmol/L (ref 22–32)
Calcium: 8.8 mg/dL — ABNORMAL LOW (ref 8.9–10.3)
Chloride: 85 mmol/L — ABNORMAL LOW (ref 101–111)
Creatinine, Ser: 0.74 mg/dL (ref 0.61–1.24)
GFR calc Af Amer: >60 mL/min (ref >60)
GFR calc non Af Amer: >60 mL/min (ref >60)
Glucose, Bld: 138 mg/dL — ABNORMAL HIGH (ref 65–99)
Potassium: 3 mmol/L — ABNORMAL LOW (ref 3.5–5.1)
Sodium: 125 mmol/L — ABNORMAL LOW (ref 135–145)
Total Bilirubin: 1.3 mg/dL — ABNORMAL HIGH (ref 0.3–1.2)
Total Protein: 6.8 g/dL (ref 6.5–8.1)

## 2016-07-06 LAB — ETHANOL: Alcohol, Ethyl (B): <5 mg/dL (ref <5)

## 2016-07-06 LAB — CBC
HCT: 38.8 % — ABNORMAL LOW (ref 39.0–52.0)
Hemoglobin: 14.9 g/dL (ref 13.0–17.0)
MCH: 36.1 pg — ABNORMAL HIGH (ref 26.0–34.0)
MCHC: 38.4 g/dL — ABNORMAL HIGH (ref 30.0–36.0)
MCV: 93.9 fL (ref 78.0–100.0)
Platelets: 115 10*3/uL — ABNORMAL LOW (ref 150–400)
RBC: 4.13 MIL/uL — ABNORMAL LOW (ref 4.22–5.81)
RDW: 13.1 % (ref 11.5–15.5)
WBC: 6.2 10*3/uL (ref 4.0–10.5)

## 2016-07-06 LAB — CK: CK TOTAL: 242 U/L (ref 49–397)

## 2016-07-06 LAB — PROTIME-INR
INR: 1.18
PROTHROMBIN TIME: 15.1 s (ref 11.4–15.2)

## 2016-07-06 LAB — URINALYSIS, ROUTINE W REFLEX MICROSCOPIC
Bilirubin Urine: NEGATIVE
Glucose, UA: 50 mg/dL — AB
Hgb urine dipstick: NEGATIVE
Ketones, ur: NEGATIVE mg/dL
Leukocytes, UA: NEGATIVE
Nitrite: NEGATIVE
Protein, ur: NEGATIVE mg/dL
Specific Gravity, Urine: 1.006 (ref 1.005–1.030)
pH: 8 (ref 5.0–8.0)

## 2016-07-06 LAB — MAGNESIUM
Magnesium: 1.4 mg/dL — ABNORMAL LOW (ref 1.7–2.4)
Magnesium: 1.9 mg/dL (ref 1.7–2.4)

## 2016-07-06 LAB — CBG MONITORING, ED: Glucose-Capillary: 149 mg/dL — ABNORMAL HIGH (ref 65–99)

## 2016-07-06 LAB — OSMOLALITY: OSMOLALITY: 261 mosm/kg — AB (ref 275–295)

## 2016-07-06 LAB — OSMOLALITY, URINE: Osmolality, Ur: 317 mOsm/kg (ref 300–900)

## 2016-07-06 MED ORDER — POTASSIUM CHLORIDE CRYS ER 20 MEQ PO TBCR
60.0000 meq | EXTENDED_RELEASE_TABLET | Freq: Once | ORAL | Status: DC
Start: 2016-07-06 — End: 2016-07-06
  Filled 2016-07-06: qty 3

## 2016-07-06 MED ORDER — POTASSIUM CHLORIDE CRYS ER 20 MEQ PO TBCR
40.0000 meq | EXTENDED_RELEASE_TABLET | Freq: Once | ORAL | Status: AC
  Administered 2016-07-06: 40 meq via ORAL
  Filled 2016-07-06: qty 2

## 2016-07-06 MED ORDER — VITAMIN B-1 100 MG PO TABS
100.0000 mg | ORAL_TABLET | Freq: Every day | ORAL | Status: DC
  Administered 2016-07-07 – 2016-07-08 (×2): 100 mg via ORAL
  Filled 2016-07-06 (×2): qty 1

## 2016-07-06 MED ORDER — POTASSIUM CHLORIDE IN NACL 40-0.9 MEQ/L-% IV SOLN
INTRAVENOUS | Status: AC
  Administered 2016-07-07: 75 mL/h via INTRAVENOUS
  Filled 2016-07-06: qty 1000

## 2016-07-06 MED ORDER — ACETAMINOPHEN 325 MG PO TABS: 650.0000 mg | ORAL_TABLET | Freq: Four times a day (QID) | ORAL | Status: DC | PRN

## 2016-07-06 MED ORDER — SODIUM CHLORIDE 0.9% FLUSH
3.0000 mL | Freq: Two times a day (BID) | INTRAVENOUS | Status: DC
  Administered 2016-07-06 – 2016-07-08 (×5): 3 mL via INTRAVENOUS

## 2016-07-06 MED ORDER — ENOXAPARIN SODIUM 40 MG/0.4ML ~~LOC~~ SOLN
40.0000 mg | SUBCUTANEOUS | Status: DC
  Administered 2016-07-06 – 2016-07-07 (×2): 40 mg via SUBCUTANEOUS
  Filled 2016-07-06 (×2): qty 0.4

## 2016-07-06 MED ORDER — ADULT MULTIVITAMIN W/MINERALS CH
1.0000 | ORAL_TABLET | Freq: Every day | ORAL | Status: DC
  Administered 2016-07-07 – 2016-07-08 (×2): 1 via ORAL
  Filled 2016-07-06 (×2): qty 1

## 2016-07-06 MED ORDER — SODIUM CHLORIDE 0.9 % IV SOLN
1000.0000 mg | Freq: Two times a day (BID) | INTRAVENOUS | Status: DC
  Administered 2016-07-06 – 2016-07-08 (×4): 1000 mg via INTRAVENOUS
  Filled 2016-07-06 (×5): qty 10

## 2016-07-06 MED ORDER — MAGNESIUM SULFATE 2 GM/50ML IV SOLN
2.0000 g | Freq: Once | INTRAVENOUS | Status: AC
  Administered 2016-07-06: 2 g via INTRAVENOUS
  Filled 2016-07-06: qty 50

## 2016-07-06 MED ORDER — LORAZEPAM 2 MG/ML IJ SOLN: 1.0000 mg | Freq: Four times a day (QID) | INTRAMUSCULAR | Status: DC | PRN

## 2016-07-06 MED ORDER — SODIUM CHLORIDE 0.9 % IV SOLN
1000.0000 mg | Freq: Once | INTRAVENOUS | Status: AC
  Administered 2016-07-06: 1000 mg via INTRAVENOUS
  Filled 2016-07-06: qty 10

## 2016-07-06 MED ORDER — ACETAMINOPHEN 650 MG RE SUPP: 650.0000 mg | Freq: Four times a day (QID) | RECTAL | Status: DC | PRN

## 2016-07-06 MED ORDER — OXCARBAZEPINE 300 MG PO TABS
600.0000 mg | ORAL_TABLET | Freq: Two times a day (BID) | ORAL | Status: DC
  Administered 2016-07-06 – 2016-07-08 (×4): 600 mg via ORAL
  Filled 2016-07-06 (×4): qty 2

## 2016-07-06 MED ORDER — THIAMINE HCL 100 MG/ML IJ SOLN
Freq: Once | INTRAVENOUS | Status: DC
  Filled 2016-07-06: qty 1000

## 2016-07-06 MED ORDER — ATORVASTATIN CALCIUM 40 MG PO TABS
40.0000 mg | ORAL_TABLET | Freq: Every day | ORAL | Status: DC
  Administered 2016-07-06 – 2016-07-07 (×2): 40 mg via ORAL
  Filled 2016-07-06 (×3): qty 1

## 2016-07-06 MED ORDER — METOPROLOL TARTRATE 12.5 MG HALF TABLET
12.5000 mg | ORAL_TABLET | Freq: Two times a day (BID) | ORAL | Status: DC
  Administered 2016-07-06: 12.5 mg via ORAL
  Filled 2016-07-06: qty 1

## 2016-07-06 MED ORDER — METOPROLOL TARTRATE 5 MG/5ML IV SOLN
10.0000 mg | Freq: Four times a day (QID) | INTRAVENOUS | Status: DC
  Administered 2016-07-07 (×2): 10 mg via INTRAVENOUS
  Filled 2016-07-06 (×3): qty 10

## 2016-07-06 MED ORDER — LEVETIRACETAM 500 MG PO TABS
1000.0000 mg | ORAL_TABLET | Freq: Two times a day (BID) | ORAL | Status: DC
  Administered 2016-07-06: 1000 mg via ORAL
  Filled 2016-07-06: qty 2

## 2016-07-06 MED ORDER — ASPIRIN 325 MG PO TABS
325.0000 mg | ORAL_TABLET | Freq: Every day | ORAL | Status: DC
  Administered 2016-07-07 – 2016-07-08 (×2): 325 mg via ORAL
  Filled 2016-07-06 (×2): qty 1

## 2016-07-06 MED ORDER — POTASSIUM CHLORIDE 10 MEQ/100ML IV SOLN
10.0000 meq | INTRAVENOUS | Status: AC
  Administered 2016-07-06 (×2): 10 meq via INTRAVENOUS
  Filled 2016-07-06 (×2): qty 100

## 2016-07-06 MED ORDER — FOLIC ACID 1 MG PO TABS
1.0000 mg | ORAL_TABLET | Freq: Every day | ORAL | Status: DC
  Administered 2016-07-07 – 2016-07-08 (×2): 1 mg via ORAL
  Filled 2016-07-06 (×2): qty 1

## 2016-07-06 MED ORDER — LORAZEPAM 1 MG PO TABS: 1.0000 mg | ORAL_TABLET | Freq: Four times a day (QID) | ORAL | Status: DC | PRN

## 2016-07-06 NOTE — H&P (Signed)
Date: 07/06/2016               Patient Name:  Miguel Hanson MRN: [id number]  DOB: 1945/03/05 Age / Sex: 71 y.o., male   PCP: [facility] Service: Internal Medicine Teaching Service              Attending Physician: Dr. Evette Doffing, Mallie Mussel, *    First Contact: Madelynn Done, MS4 Pager: [phone]  Second Contact: Dr. Vernelle Emerald Pager: [phone]  Third Contact Dr. N/A Pager: [phone]       After Hours (After 5p/  First Contact Pager: [phone]  weekends / holidays): Second Contact Pager: [phone]   Chief Complaint:  Altered mental status  History of Present Illness: Mr. Depuy is a 71 year old male with a past medical history of seizure disorder, Wernicke-Korsakoff encephalopathy, ploysubstance abuse (alcohol, marijuana), PTSD, HTN, COPD and diastolic HF who presented to the ED 5/7 via EMS for altered mental status. Current history obtained via chart review and telephone conversation with the patient's wife, as patient has difficulty understanding and responding to questions at this time. Per EMS, a home health nurse was checking on patient's wife, who is bedbound, earlier this morning and found the patient on the floor "confused." Nurse helped the patient to the couch and called 911. His wife states that she thinks she heard him fall out of bed this morning, "which is what usually happens when he has a seizure." She is not sure what exactly happened, however, because they do not sleep in the same bed. She adds that he does not take his seizure medications consistently. She believes that his last drink was on May 2nd and that he drinks "a lot of beer" but is unable to quantify this further.  Apparently patient has had numerous prior hospital admissions for altered mental status, including twice in the past year: July 2017 -- post-ictal following a witnessed seizure after running out of seizure medications; April 2017 -- reported a history of not taking  anti-seizure medications for one week. His wife states that at baseline he is not generally confused and is able to speak and respond to questions without much difficulty.  Allergies: Allergies as of 07/06/2016  . (No Known Allergies)   Past Medical History:  Diagnosis Date  . Alcohol abuse   . Depression   . Drug abuse   . ETOH abuse   . H/O prolonged Q-T interval on ECG   . Hypertension   . Polysubstance abuse   . PTSD (post-traumatic stress disorder)   . Seizures ([place])    Past Surgical History:  Procedure Laterality Date  . NO PAST SURGERIES     Family History  Problem Relation Age of Onset  . Hypertension Mother   . Hypertension Father    Social History   Social History  . Marital status: Married    Spouse name: [name]   . Number of children: 3  . Years of education: 12   Occupational History  . Not on file.   Social History Main Topics  . Smoking status: Current Some Day Smoker    Packs/day: 0.50    Years: 49.00    Types: Cigarettes  . Smokeless tobacco: Current User  . Alcohol use 21.6 oz/week    36 Shots of liquor per week     Comment: 03/01/2013 "drink a pint of brandy q 2 days" "Drinks alcohol often"  .  Drug use: Yes    Types: Marijuana     Comment: per spouse per ems ":1 joint per day"  . Sexual activity: No   Other Topics Concern  . Not on file   Social History Narrative   Lives at home with wife.   Caffeine use: Drinks coffee "Every now and then"   Review of Systems: Unable to obtain due to patient's clinical condition.  Physical Exam: Blood pressure 135/87, pulse 94, temperature 98.6 F (37 C), temperature source Oral, resp. rate 14, weight 56.7 kg (125 lb), SpO2 100 %. General: Pleasant but confused appearing male sitting up in bed. HEENT: Head normocephalic, atraumatic. Pupils equal, round, and reactive to light. Extraocular movements grossly intact, although difficult to assess fully due to difficulty following commands. Dry mucus  membranes. Cardiovascular: Tachycardic with regular rhythm. No murmurs, rubs, or gallops. Pulmonary: Clear to auscultation bilaterally. Normal work of breathing. Abdominal: Soft, non-tender. Extremities: Warm and dry. 2+ dorsalis pedis pulses bilaterally. No lower extremity edema. Neurologic: Expressive aphasia -- repeats "one" and "two" in response to questions; will sometimes say "yes" or "no." Has some difficulty following commands but moves all extremities well.   Lab results: Basic Metabolic Panel:  Recent Labs  07/06/16 1000 07/06/16 1053  NA 125*  --   K 3.0*  --   CL 85*  --   CO2 29  --   GLUCOSE 138*  --   BUN <5*  --   CREATININE 0.74  --   CALCIUM 8.8*  --   MG  --  1.4*   Liver Function Tests:  Recent Labs  07/06/16 1000  AST 60*  ALT 26  ALKPHOS 61  BILITOT 1.3*  PROT 6.8  ALBUMIN 3.9   CBC:  Recent Labs  07/06/16 1000  WBC 6.2  HGB 14.9  HCT 38.8*  MCV 93.9  PLT 115*   CBG:  Recent Labs  07/06/16 0950  GLUCAP 149*   Urine Drug Screen: Drugs of Abuse     Component Value Date/Time   LABOPIA NONE DETECTED 09/12/2015 0638   COCAINSCRNUR NONE DETECTED 09/12/2015 0638   COCAINSCRNUR NEGATIVE 12/11/2012 1207   LABBENZ NONE DETECTED 09/12/2015 0638   LABBENZ POSITIVE (A) 12/11/2012 1207   AMPHETMU NONE DETECTED 09/12/2015 0638   THCU POSITIVE (A) 09/12/2015 0638   LABBARB NONE DETECTED 09/12/2015 0638    Alcohol Level:  Recent Labs  07/06/16 1053  ETH <5   Urinalysis:  Recent Labs  07/06/16 0940  [place] 1.006  PHURINE 8.0  GLUCOSEU [place] NEGATIVE  NITRITE NEGATIVE  LEUKOCYTESUR NEGATIVE   Other results: EKG: Sinus tachycardia with prolonged QT interval. No acute ischemic changes.  Problem List: -- Altered mental status -- Seizure disorder -- Polysubstance abuse (alcohol, marijuana) -- PTSD -- HTN -- COPD -- Chronic diastolic  HF, NYHA class 1 -- Thrombocytopenia -- Hyponatremia -- Hypokalemia -- Hypomagnesemia  Assessment and Plan by Problem: #Altered mental status #Seizure disorder Differential diagnosis for Mr. Hicklin' altered mental status includes post-ictal state following an unwitnessed seizure secondary to treatment nonadherence and/or alcohol withdrawal, worsening Wernicke encephalopathy / Korsakoff syndrome, stroke, and infection. Given his history of medication nonadherence, his wife stating that she thinks she heard him fall out of bed "which is what usually happens when he has a seizure," and his two prior hospital admissions in the past year due to altered mental status due to post-ictal state following  a seizure, an unwitnessed seizure secondary to treatment nonadherence appears most likely at this time. Neurologic exam appears nonfocal, although difficult to assess fully due to difficulty following commands. CBC and UA not suspicious for infection. -- Neuro checks -- Home Keppra and Trileptal  -- Check 10-hydroxycarbazepine level -- Seizure precautions -- NPO pending swallow study -- Consider consulting Neurology tomorrow am -- Consider EEG or head CT/MRI tomorrow am if mental status does not improve  #Polysubstance abuse (alcohol, marijuana) #PTSD -- CIWA protocol -- Thiamine, folate, and multivitamin supplementation  #HTN -- Continue home metoprolol  #COPD -- Not on home medications; continue to monitor  #Chronic diastolic HF, NYHA class 1 EF 55-60% with grade 1 diastolic dysfunction as noted on echo 05/08/14. -- Compensated; continue to monitor  #Thrombocytopenia Platelet count 115 today; 119 last July. Appears chronic; likely due to alcoholism. -- Monitor CBC for worsening  #Hyponatremia #Hypokalemia #Hypomagnesemia Hyponatremia is likely hypovolemic, but beer potomania is also a possibility. Will work-up further and reassess. -- Urine/serum osmoles -- IV normal saline for volume  repletion -- Replete K and Mg; repeat electrolytes and EKG -- Monitor on telemetry  FENGI: NPO DVT PPX: Lovenox subcut Code Status: Presume full code Dispo: Patient admitted to Internal Medicine teaching service for management of altered mental status. Anticipate >2 midnight stay.  This is a Careers information officer Note.  The care of the patient was discussed with Dr. Benjamine Mola and the assessment and plan was formulated with their assistance.  Please see their note for official documentation of the patient encounter.   Signed: Madelynn Done, Medical Student 07/06/2016, 3:22 PM

## 2016-07-06 NOTE — ED Triage Notes (Signed)
Arrived by EMS Home health nurse checking on wife who is bed bound found patient on the floor confused.  Helped to the couch and called 911.  EMS reported CBG 189 alert and confused airway intact bilateral equal chest rise and fall. Able to follow commands.

## 2016-07-06 NOTE — ED Notes (Signed)
Checked patient cbg it was 36 notified Dr Wilson Singer of blood sugar

## 2016-07-06 NOTE — ED Provider Notes (Signed)
[facility] DEPT Provider Note   CSN: [id number] Arrival date & time: 07/06/16  6568     History   Chief Complaint Chief Complaint  Patient presents with  . Seizures  . Altered Mental Status    HPI Miguel Hanson is a 71 y.o. male.  HPI   70yM with confusion. Pt found at home by home health nurse who actually came to check on pt's wife. Pt was awake but confused. He is unable to tell me how/why he ended up on floor. He denies acute pain. Denies drug or alcohol use but does have a listed hx of abuse. Unclear how long he was down. Listed hx of seizures as well.   Past Medical History:  Diagnosis Date  . Alcohol abuse   . Depression   . Drug abuse   . ETOH abuse   . H/O prolonged Q-T interval on ECG   . Hypertension   . Polysubstance abuse   . PTSD (post-traumatic stress disorder)   . Seizures [facility])     Patient Active Problem List   Diagnosis Date Noted  . Essential hypertension   . ETOH abuse   . Hypokalemia   . Hypomagnesemia   . Seizure ([place]) 06/27/2015  . Thrombocytopenia ([place]) 06/27/2015  . Fall 06/27/2015  . Convulsions ([place])   . Acute arterial ischemic stroke, vertebrobasilar, cerebellar ([place]) 12/28/2014  . Malnutrition of moderate degree 12/28/2014  . Aphasia 07/04/2014  . Acute encephalopathy 07/04/2014  . Encephalopathy acute   . Alcohol withdrawal delirium ([place])   . Confusion   . Chronic diastolic heart failure, NYHA class 1 ([place]) 05/08/2014  . Expressive aphasia 05/08/2014  . Macrocytic anemia 05/08/2014  . Prolonged Q-T interval on ECG   . Hepatic encephalopathy ([place]) 02/25/2014  . Protein-calorie malnutrition, severe ([place]) 03/03/2013  . Dilantin toxicity 03/01/2013  . HTN (hypertension), malignant 12/16/2011  . Seizure, convulsive ([place]) 08/11/2011  . Altered mental status 08/11/2011  . COPD (chronic obstructive pulmonary disease) ([place]) 03/10/2011  . Seizure disorder ([place]) 03/08/2011  . Alcohol abuse 03/08/2011  . PTSD (post-traumatic  stress disorder) 03/08/2011    Past Surgical History:  Procedure Laterality Date  . NO PAST SURGERIES         Home Medications    Prior to Admission medications   Medication Sig Start Date End Date Taking? Authorizing Provider  aspirin 325 MG tablet Take 1 tablet (325 mg total) by mouth daily. 12/31/14   Delfina Redwood, MD  atorvastatin (LIPITOR) 40 MG tablet Take 1 tablet (40 mg total) by mouth daily at 6 PM. 09/13/15   Eugenie Filler, MD  docusate sodium (COLACE) 100 MG capsule Take 1 capsule (100 mg total) by mouth 2 (two) times daily. 06/30/15   Mikhail, Velta Addison, DO  feeding supplement, ENSURE ENLIVE, (ENSURE ENLIVE) LIQD Take 237 mLs by mouth 3 (three) times daily between meals. 06/30/15   Cristal Ford, DO  folic acid (FOLVITE) 1 MG tablet Take 1 tablet (1 mg total) by mouth daily. 09/13/15   Eugenie Filler, MD  levETIRAcetam (KEPPRA) 1000 MG tablet Take 1 tablet (1,000 mg total) by mouth 2 (two) times daily. 09/13/15   Eugenie Filler, MD  metoprolol tartrate (LOPRESSOR) 25 MG tablet Take 0.5 tablets (12.5 mg total) by mouth 2 (two) times daily. 09/13/15   Eugenie Filler, MD  Multiple Vitamin (MULTIVITAMIN WITH MINERALS) TABS tablet Take 1 tablet by mouth daily. 06/30/15   Mikhail, Velta Addison, DO  oxcarbazepine (TRILEPTAL) 600 MG tablet Take 1  tablet (600 mg total) by mouth 2 (two) times daily. 09/13/15   Eugenie Filler, MD  thiamine 100 MG tablet Take 1 tablet (100 mg total) by mouth daily. 06/30/15   Cristal Ford, DO    Family History Family History  Problem Relation Age of Onset  . Hypertension Mother   . Hypertension Father     Social History Social History  Substance Use Topics  . Smoking status: Current Some Day Smoker    Packs/day: 0.50    Years: 49.00    Types: Cigarettes  . Smokeless tobacco: Current User  . Alcohol use 21.6 oz/week    36 Shots of liquor per week     Comment: 03/01/2013 "drink a pint of brandy q 2 days" "Drinks alcohol  often"     Allergies   Patient has no known allergies.   Review of Systems Review of Systems  Level 5 caveat because of confusion.    Physical Exam Updated Vital Signs BP (!) 130/91   Pulse 92   Temp 98.4 F (36.9 C) (Oral)   Resp 18   Wt 125 lb (56.7 kg)   SpO2 98%   BMI 21.46 kg/m   Physical Exam  Constitutional: He appears well-developed and well-nourished. No distress.  HENT:  Head: Normocephalic and atraumatic.  Eyes: Conjunctivae are normal. Right eye exhibits no discharge. Left eye exhibits no discharge.  Neck: Neck supple.  Cardiovascular: Regular rhythm and normal heart sounds.  Exam reveals no gallop and no friction rub.   No murmur heard. Mild tachycardia  Pulmonary/Chest: Effort normal and breath sounds normal. No respiratory distress.  Abdominal: Soft. He exhibits no distension. There is no tenderness.  Musculoskeletal: He exhibits no edema or tenderness.  Neurological: He is alert.  Oriented to self. Follows simple commands. No focal motor deficit. Will answer some very simple questions, but is generally confused.   Skin: Skin is warm and dry.  Nursing note and vitals reviewed.    ED Treatments / Results  Labs (all labs ordered are listed, but only abnormal results are displayed) Labs Reviewed  COMPREHENSIVE METABOLIC PANEL - Abnormal; Notable for the following:       Result Value   Sodium 125 (*)    Potassium 3.0 (*)    Chloride 85 (*)    Glucose, Bld 138 (*)    BUN <5 (*)    Calcium 8.8 (*)    AST 60 (*)    Total Bilirubin 1.3 (*)    All other components within normal limits  URINALYSIS, ROUTINE W REFLEX MICROSCOPIC - Abnormal; Notable for the following:    Color, Urine STRAW (*)    Glucose, UA 50 (*)    All other components within normal limits  CBG MONITORING, ED - Abnormal; Notable for the following:    Glucose-Capillary 149 (*)    All other components within normal limits  CBC    EKG  EKG Interpretation  Date/Time:  Monday  Jul 06 2016 09:44:36 EDT Ventricular Rate:  96 PR Interval:    QRS Duration: 101 QT Interval:  398 QTC Calculation: 503 R Axis:   72 Text Interpretation:  Sinus tachycardia Multiple premature complexes, vent & supraven Prolonged QT interval Confirmed by Wilson Singer  MD, Zniya Cottone ([id number]) on 07/06/2016 11:16:42 AM       Radiology No results found.  Procedures Procedures (including critical care time)  Medications Ordered in ED Medications - No data to display   Initial Impression / Assessment and Plan / ED  Course  I have reviewed the triage vital signs and the nursing notes.  Pertinent labs & imaging results that were available during my care of the patient were reviewed by me and considered in my medical decision making (see chart for details).  89yM with AMS. Found confused at home. Unclear when last normal. Possible seizure. Hx of the same. Given dose of keppra. . Pt denies, but is unreliable historian. Aside from confusion, neuro exam is nonfocal. Does follow commands. No external signs of trauma. Noted to be hyponatremic. No obviously offending medications. Listed hx of ETOH abuse. Could be from from this. Fluid restrict in ED.   Final Clinical Impressions(s) / ED Diagnoses   Final diagnoses:  Hyponatremia  Altered mental status, unspecified altered mental status type    New Prescriptions New Prescriptions   No medications on file     Virgel Manifold, MD 07/12/16 [phone]

## 2016-07-06 NOTE — H&P (Signed)
Date: 07/06/2016               Patient Name:  Miguel Hanson MRN: [id number]  DOB: September 24, 1945 Age / Sex: 71 y.o., male   PCP: Lucianne Lei, MD         Medical Service: Internal Medicine Teaching Service         Attending Physician: Dr. Evette Doffing, Mallie Mussel, *    First Contact: Madelynn Done, MS4 Pager: [phone]  Second Contact: Dr. Vernelle Emerald Pager: [phone]       After Hours (After 5p/  First Contact Pager: [phone]  weekends / holidays): Second Contact Pager: [phone]   Chief Complaint: Altered mental status  History of Present Illness: Miguel Hanson is a 71 year old man with a past medical history significant for seizure disorder, alcohol use disorder, PTSD, hypertension, COPD, and diastolic congestive heart failure who was brought to the emergency department by EMS for altered mental status. The history is obtained through chart review and discussion with emergency provider and the patient's wife due to his encephalopathy and aphasia. The patient was actually observed by EMS staff visiting the home to evaluate the patient's wife who is bedbound. They found the patient on the floor in a confused state. According to his wife she thinks she heard a fall but was not present in the room and unable to go evaluate her husband. It is unclear the last time he was at baseline mental status. It is unclear at this time if or how many doses of antiepileptic medication he may have missed. Time of his last alcoholic drink is also unknown althuogh ethanol level was undetectable in the emergency department. His wife reported this was probably on 5/2.  He has been seen in the emergency department multiple times over the past year for episodes of encephalopathy most attributed to his seizure disorder. During these evaluations he has undergone multiple head CTs and a MRI suggesting global atrophy without focal abnormalities found. According to the patient's wife he has had multiple seizures in the past due  to periods of missing his antiepileptic medications.  Meds:  Current Meds  Medication Sig  . levETIRAcetam (KEPPRA) 1000 MG tablet Take 1 tablet (1,000 mg total) by mouth 2 (two) times daily.  . metoprolol tartrate (LOPRESSOR) 25 MG tablet Take 0.5 tablets (12.5 mg total) by mouth 2 (two) times daily.  Marland Kitchen oxcarbazepine (TRILEPTAL) 600 MG tablet Take 1 tablet (600 mg total) by mouth 2 (two) times daily.     Allergies: Allergies as of 07/06/2016  . (No Known Allergies)   Past Medical History:  Diagnosis Date  . Alcohol abuse   . Depression   . Drug abuse   . ETOH abuse   . H/O prolonged Q-T interval on ECG   . Hypertension   . Polysubstance abuse   . PTSD (post-traumatic stress disorder)   . PTSD (post-traumatic stress disorder)    "got RX and saw psychiatrist when he went to [facility]; doesn't get treated w/Dr. Criss Rosales" (07/06/2016)  . Seizures ([place])     Family History  Problem Relation Age of Onset  . Hypertension Mother   . Hypertension Father     Social History   Social History  . Marital status: Married    Spouse name: [name]   . Number of children: 3  . Years of education: 60   Social History Main Topics  . Smoking status: Current Some Day Smoker    Packs/day: 0.50    Years:  49.00    Types: Cigarettes  . Smokeless tobacco: Current User  . Alcohol use Yes     Comment: 03/01/2013 "drink a pint of brandy q 2 days" "Drinks alcohol often"; 07/06/2016 "he drinks alot of beer; quite a bit of brandy; last drink was 2-3 days ago"  . Drug use: Yes    Types: Marijuana     Comment: 07/06/2016 per spouse ":1 joint maybe q 2 wks""  . Sexual activity: No   Social History Narrative   Lives at home with wife.   Caffeine use: Drinks coffee "Every now and then"     Review of Systems: A complete ROS could not be obtained due to the patient's altered mental status  Physical Exam: Blood pressure 135/87, pulse 94, temperature 98.6 F (37 C), temperature source Oral,  resp. rate 14, height 5\' 3"  (1.6 m), weight 125 lb (56.7 kg), SpO2 100 %. GENERAL- Confused appearing man resting in bed in no acute distress HEENT- Atraumatic, PERRL, arcus senilis present, could not fully assess EOM but appear probably intact CARDIAC- Slight tachycardia with regular rhythm, no murmurs, rubs or gallops. RESP- CTAB, no wheezes or crackles. ABDOMEN- Soft, nontender, no guarding or rebound NEURO- Limited by his confusion, moves all extremities on command, perseverating with a few words "1" "2" EXTREMITIES- pulse 2+, symmetric, no pedal edema, no contusions, no peripheral edema SKIN- Warm, dry, No rash or lesion.    EKG: Sinus rhythm at a rate of 96 bpm with borderline QT and PR interval prolongations, no significant new ST segment abnormalities  CXR: none obtained  Assessment & Plan by Problem: #Acute encephalopathy #Seizure disorder At this time his confusion seems most likely from his seizure disorder. This could have been provoked by his hyponatremia, alcohol withdrawal, or noncompliance with home antiepileptic medications. We'll check oxcarbazepine metabolite levels and restart his home antiepileptics. Seizure precautions and close neurological monitoring will be continued overnight. If he is postictal secondary to seizures based expected see some clearance of his mental status by tomorrow. If seizures recur overnight we will begin abortive therapy with benzodiazepines immediately. If his mental status is not improving by tomorrow we could consider EEG and neurology evaluation to rule out ongoing subclinical or partial seizure activity.  Infection is less likely without any fever, leukocytosis, or other systemic manifestations. His metabolic derangements do not obviously explain altered mental status. A new CNS lesion seems less likely without focal neurologic changes but we can consider head imaging if problems are not improving. There is no contusion or laceration, but  based on the wife's history he could have fallen at home so consider a traumatic process. He has had multiple unremarkable head images over the past year when being evaluated for episodes of altered mental status.   #Alcohol use disorder  Per family and chart review it appears he consumes very large amounts of beer on a regular basis, last drink being maybe on 5/2. This is a possible cause of seizure and also his current hyponatremia. We'll monitor on CIWA protocol and supplement with thiamine, folate, multivitamins.  #Hyponatremia Sodium is 125 on presentation with a history of previously normal sodium as recently as July 2017. This is probably not low enough to cause seizure or his current severe encephalopathy alone. Etiology is unclear we will check serum and urine osmolality to try to assess for a low by mouth solute intake versus SIADH or other cause.  #Protein-calorie malnutrition, severe #Hypokalemia #Hypomagnesemia Several areas of muscle wasting and very low  body fat this could be due to poor nutritional intake secondary to alcohol abuse. In setting of his metabolic derangements this could also explain them with poor oral intake. We will supplement electrolytes and repeat again in the morning.  #Chronic diastolic heart failure, NYHA class 1 (HCC) #HTN Blood pressures are normal to minimally elevated on presentation. We will continue his home metoprolol. He does not appear to be in acute exacerbation.  #Thrombocytopenia Plts 115. This is a mild and chronic appearing issue could be explained by some early liver disease or has malnutrition. No intervention at this time and normal VTE prophylaxis offered.  He'll be nothing by mouth for now until mental status is clearing up. Subcutaneous Lovenox for DVT prophylaxis. Patient will be admitted as presumed full code status. Dispo: Admit patient to Inpatient with expected length of stay greater than 2 midnights.  Signed: Collier Salina,  MD PGY-II Internal Medicine Resident Pager# [phone] 07/06/2016, 9:31 PM

## 2016-07-07 ENCOUNTER — Inpatient Hospital Stay (HOSPITAL_COMMUNITY): Payer: Medicare Other

## 2016-07-07 ENCOUNTER — Encounter (HOSPITAL_COMMUNITY): Payer: Self-pay

## 2016-07-07 DIAGNOSIS — G9340 Encephalopathy, unspecified: Secondary | ICD-10-CM

## 2016-07-07 LAB — CBC
HCT: 35.6 % — ABNORMAL LOW (ref 39.0–52.0)
HEMOGLOBIN: 14 g/dL (ref 13.0–17.0)
MCH: 37.2 pg — AB (ref 26.0–34.0)
MCHC: 39.3 g/dL — AB (ref 30.0–36.0)
MCV: 94.7 fL (ref 78.0–100.0)
Platelets: 106 10*3/uL — ABNORMAL LOW (ref 150–400)
RBC: 3.76 MIL/uL — AB (ref 4.22–5.81)
RDW: 12.7 % (ref 11.5–15.5)
WBC: 6.7 10*3/uL (ref 4.0–10.5)

## 2016-07-07 LAB — COMPREHENSIVE METABOLIC PANEL
ALBUMIN: 3.6 g/dL (ref 3.5–5.0)
ALT: 23 U/L (ref 17–63)
ANION GAP: 10 (ref 5–15)
AST: 42 U/L — ABNORMAL HIGH (ref 15–41)
Alkaline Phosphatase: 54 U/L (ref 38–126)
BUN: <5 mg/dL — ABNORMAL LOW (ref 6–20)
CO2: 25 mmol/L (ref 22–32)
Calcium: 8.2 mg/dL — ABNORMAL LOW (ref 8.9–10.3)
Chloride: 91 mmol/L — ABNORMAL LOW (ref 101–111)
Creatinine, Ser: 0.72 mg/dL (ref 0.61–1.24)
GFR calc Af Amer: >60 mL/min (ref >60)
GFR calc non Af Amer: >60 mL/min (ref >60)
GLUCOSE: 103 mg/dL — AB (ref 65–99)
POTASSIUM: 2.9 mmol/L — AB (ref 3.5–5.1)
SODIUM: 126 mmol/L — AB (ref 135–145)
Total Bilirubin: 1.6 mg/dL — ABNORMAL HIGH (ref 0.3–1.2)
Total Protein: 6.4 g/dL — ABNORMAL LOW (ref 6.5–8.1)

## 2016-07-07 LAB — BASIC METABOLIC PANEL
Anion gap: 11 (ref 5–15)
CHLORIDE: 93 mmol/L — AB (ref 101–111)
CO2: 22 mmol/L (ref 22–32)
CREATININE: 0.56 mg/dL — AB (ref 0.61–1.24)
Calcium: 8.4 mg/dL — ABNORMAL LOW (ref 8.9–10.3)
GFR calc Af Amer: >60 mL/min (ref >60)
GLUCOSE: 130 mg/dL — AB (ref 65–99)
POTASSIUM: 3.7 mmol/L (ref 3.5–5.1)
SODIUM: 126 mmol/L — AB (ref 135–145)

## 2016-07-07 LAB — MAGNESIUM: Magnesium: 1.8 mg/dL (ref 1.7–2.4)

## 2016-07-07 MED ORDER — ENSURE ENLIVE PO LIQD
237.0000 mL | Freq: Three times a day (TID) | ORAL | Status: DC
  Administered 2016-07-07 – 2016-07-08 (×3): 237 mL via ORAL

## 2016-07-07 MED ORDER — POTASSIUM CHLORIDE CRYS ER 20 MEQ PO TBCR
40.0000 meq | EXTENDED_RELEASE_TABLET | Freq: Two times a day (BID) | ORAL | Status: AC
  Administered 2016-07-07 (×2): 40 meq via ORAL
  Filled 2016-07-07 (×2): qty 2

## 2016-07-07 NOTE — Procedures (Signed)
ELECTROENCEPHALOGRAM REPORT  Date of Study: 07/07/2016  Patient's Name: Miguel Hanson MRN: [id number] Date of Birth: December 26, 1945  Referring Provider: Dr. Lalla Brothers  Clinical History: This is a 71 year old man with a history of seizures, admitted for altered mental status.  Medications: levETIRAcetam (KEPPRA) 1,000 mg in sodium chloride 0.9 % 100 mL IVPB  acetaminophen (TYLENOL) tablet 650 mg  aspirin tablet 325 mg  atorvastatin (LIPITOR) tablet 40 mg  enoxaparin (LOVENOX) injection 40 mg  feeding supplement (ENSURE ENLIVE) (ENSURE ENLIVE) liquid 616 mL  folic acid (FOLVITE) tablet 1 mg  metoprolol (LOPRESSOR) injection 10 mg  multivitamin with minerals tablet 1 tablet  Oxcarbazepine (TRILEPTAL) tablet 600 mg  potassium chloride SA (K-DUR,KLOR-CON) CR tablet 40 mEq  thiamine (VITAMIN B-1) tablet 100 mg   Technical Summary: A multichannel digital EEG recording measured by the international 10-20 system with electrodes applied with paste and impedances below 5000 ohms performed in our laboratory with EKG monitoring in an awake and drowsy patient.  Hyperventilation and photic stimulation were not performed.  The digital EEG was referentially recorded, reformatted, and digitally filtered in a variety of bipolar and referential montages for optimal display.    Description: The patient is awake and drowsy during the recording. There is no clear posterior dominant rhythm. There is electrode artifact at  P3, however there appears to be underlying focal delta slowing seen over the left temporal region in addition to the electrode artifact.  During drowsiness, there is an increase in theta and delta slowing of the background. Deeper stages of sleep were not seen. Hyperventilation and photic stimulation were not performed.  There were no epileptiform discharges or electrographic seizures seen.    Technical difficulties with EKG lead.  Impression: This awake and asleep EEG is abnormal due to  occasional focal slowing over the left temporal region. There is electrode artifact over P3 however there appears to be underlying focal delta slowing over the left temporal region.   Clinical Correlation of the above findings indicates focal cerebral dysfunction over the left temporal region suggestive of underlying structural or physiologic abnormality. The absence of epileptiform discharges does not exclude a clinical diagnosis of epilepsy. Clinical correlation is advised.   Ellouise Newer, [name]

## 2016-07-07 NOTE — Progress Notes (Signed)
Initial Nutrition Assessment  DOCUMENTATION CODES:   Severe malnutrition in context of chronic illness  INTERVENTION:   -Ensure Enlive po TID, each supplement provides 350 kcal and 20 grams of protein -Continue MVI daily  NUTRITION DIAGNOSIS:   Malnutrition related to chronic illness (polysubstance abuse) as evidenced by moderate depletion of body fat, severe depletion of body fat, moderate depletions of muscle mass, severe depletion of muscle mass.  GOAL:   Patient will meet greater than or equal to 90% of their needs  MONITOR:   PO intake, Supplement acceptance, Diet advancement, Labs, Weight trends, Skin, I & O's  REASON FOR ASSESSMENT:   Malnutrition Screening Tool    ASSESSMENT:   Miguel Hanson is a 71 year old male with a past medical history of seizure disorder, Wernicke-Korsakoff encephalopathy, ploysubstance abuse (alcohol, marijuana), PTSD, HTN, COPD and diastolic HF who presented to the ED 5/7 via EMS for altered mental status. Current history obtained via chart review and telephone conversation with the patient's wife, as patient has difficulty understanding and responding to questions at this time.   Pt admitted with AMS and seizure disorder.   Pt initially was able to answer first few questions questions appropriately, however, mental status declined during interview (answered each question with "I'm just here, watching this mysterious picture").   Per discussion with RN, pt was just advanced to a dysphagia 3 diet with thin liquids just prior to RD visit based on SLP eval. However, pt did not recall consuming pudding and graham crackers or SLP visiting him.   RN reports that pt is typically coherent at baseline and lives with his wife, who is bedbound.   Suspect intake was poor PTA, given hx of polysubstance abuse.   Nutrition-Focused physical exam completed. Findings are moderate to severe fat depletion, moderate to severe muscle depletion, and no edema.    Medications reviewed and include folvite, MVI, KCl, vitamin B-1, and ativan.   Labs reviewed: Na: 126 (on IV supplementation), K: 2.9 (on IV and PO supplementation).   Diet Order:  DIET DYS 3 Room service appropriate? Yes; Fluid consistency: Thin  Skin:  Reviewed, no issues  Last BM:  PTA  Height:   Ht Readings from Last 1 Encounters:  07/06/16 5\' 3"  (1.6 m)    Weight:   Wt Readings from Last 1 Encounters:  07/06/16 125 lb (56.7 kg)    Ideal Body Weight:  56.3 kg  BMI:  Body mass index is 22.14 kg/m.  Estimated Nutritional Needs:   Kcal:  1700-1900  Protein:  85-100 grams  Fluid:  1.7-1.9 L  EDUCATION NEEDS:   Education needs no appropriate at this time  Carolin Quang A. Jimmye Norman, RD, LDN, CDE Pager: [phone] After hours Pager: [phone]

## 2016-07-07 NOTE — Evaluation (Signed)
Clinical/Bedside Swallow Evaluation Patient Details  Name: Miguel Hanson MRN: [id number] Date of Birth: January 12, 1946  Today's Date: 07/07/2016 Time: SLP Start Time (ACUTE ONLY): 1324 SLP Stop Time (ACUTE ONLY): 1006 SLP Time Calculation (min) (ACUTE ONLY): 10 min  Past Medical History:  Past Medical History:  Diagnosis Date  . Alcohol abuse   . Depression   . Drug abuse   . ETOH abuse   . H/O prolonged Q-T interval on ECG   . Hypertension   . Polysubstance abuse   . PTSD (post-traumatic stress disorder)   . PTSD (post-traumatic stress disorder)    "got RX and saw psychiatrist when he went to [facility]; doesn't get treated w/Dr. Criss Rosales" (07/06/2016)  . Seizures ([place])    Past Surgical History:  Past Surgical History:  Procedure Laterality Date  . NO PAST SURGERIES     HPI:  [name] is a 71 year old man with a past medical history significant for seizure disorder, alcohol use disorder, poly substance abuse, PTSD, hypertension, COPD, and diastolic congestive heart failure who was brought to the emergency department by EMS for altered mental status. BSE 2016 mild oropharyngel dyspahgia recommended Dys 3, thin. No CXR results at this time.    Assessment / Plan / Recommendation Clinical Impression  Pt required moderate cues throughout due to confusion. No indications of compromised airway or impairments acorss consistencies. Respiratory support for volitional cough mildly weak and discoordinated oral movements. Three oz water consumed in consecutive sips without impairment. Missing and poor dentition leads to recommendation for Dys 3 and thin liquids, straws allowed, pills with thin. No follow up needed.  SLP Visit Diagnosis: Dysphagia, unspecified (R13.10)    Aspiration Risk  Mild aspiration risk    Diet Recommendation Dysphagia 3 (Mech soft);Thin liquid   Liquid Administration via: Cup;Straw Medication Administration: Whole meds with liquid Supervision: Patient able to self  feed Compensations: Slow rate;Small sips/bites;Minimize environmental distractions Postural Changes: Seated upright at 90 degrees    Other  Recommendations Oral Care Recommendations: Oral care BID   Follow up Recommendations None      Frequency and Duration            Prognosis        Swallow Study   General HPI: [name] is a 71 year old man with a past medical history significant for seizure disorder, alcohol use disorder, poly substance abuse, PTSD, hypertension, COPD, and diastolic congestive heart failure who was brought to the emergency department by EMS for altered mental status. BSE 2016 mild oropharyngel dyspahgia recommended Dys 3, thin. No CXR results at this time.  Type of Study: Bedside Swallow Evaluation Previous Swallow Assessment: see HPI Diet Prior to this Study: NPO Temperature Spikes Noted: No Respiratory Status: Room air History of Recent Intubation: No Behavior/Cognition: Alert;Cooperative;Pleasant mood;Requires cueing Oral Cavity Assessment: Within Functional Limits Oral Care Completed by SLP: No Oral Cavity - Dentition: Poor condition;Missing dentition Vision: Functional for self-feeding Self-Feeding Abilities: Able to feed self;Needs set up Patient Positioning: Upright in bed Baseline Vocal Quality: Normal Volitional Cough: Weak Volitional Swallow: Unable to elicit    Oral/Motor/Sensory Function Overall Oral Motor/Sensory Function:  (decreased coordination)   Ice Chips Ice chips: Not tested   Thin Liquid Thin Liquid: Within functional limits Presentation: Cup;Straw    Nectar Thick Nectar Thick Liquid: Not tested   Honey Thick Honey Thick Liquid: Not tested   Puree Puree: Within functional limits   Solid   GO   Solid: Within functional limits  Houston Siren 07/07/2016,10:22 AM   Orbie Pyo Colvin Caroli.Ed [employer] [phone]

## 2016-07-07 NOTE — Progress Notes (Signed)
Internal Medicine Attending:   I saw and examined the patient. I reviewed the resident's note and I agree with the resident's findings and plan as documented in the resident's note.  71 year old [place] hospital day #2 with acute encephalopathy that is most likely due to a post ictal state after a seizure. Encephalopathy is improved today, but not yet resolved. Still seems delirious with distractability, inability to follow commands and innappropriate answers to questions. Given prolonged encephalopathy, we will do an EEG to rule out partial status. Also check MRI to look rule out CVA or new lesion.

## 2016-07-07 NOTE — Progress Notes (Signed)
   Subjective: Mr. Rosner remains disoriented and not answering questions. He is speaking with more words than yesterday. He is using about 25% of his lunch and had stooled on himself prior to reevaluation this afternoon. Collateral history from his wife and sister suggests a more or less intact and oriented baseline mental status clearly better than his activity today.  Objective:  Vital signs in last 24 hours: Vitals:   07/06/16 2300 07/06/16 2330 07/07/16 0502 07/07/16 1312  BP: 100/65 124/78 103/73 136/88  Pulse: 75 86 77 73  Resp:  18 18 20   Temp:  98.5 F (36.9 C) 98.2 F (36.8 C) 98.8 F (37.1 C)  TempSrc:  Oral Oral Oral  SpO2: 95% 100% 96% 99%  Weight:      Height:       GENERAL- disoriented, follows commands inconsistently and cannot answer questions, appears comfortable in NAD HEENT- Atraumatic, EOMI, oral mucosa appears moist CARDIAC- RRR, no murmurs, rubs or gallops RESP- CTAB, no wheezes or crackles ABDOMEN- Soft, nontender, no guarding or rebound NEURO- he is moving all extremities spontaneously with grossly intact strength EXTREMITIES- warm, thin, symmetric, no pedal edema SKIN- Warm, dry, No rash or lesion  Assessment/Plan: #Acute encephalopathy #Seizure disorder This seems most likely be driven by a postictal state after recurrent seizure at home although his mental status is not improving as quickly as I would hope a day after last clinical seizure. He had multiple risk factors including hyponatremia, heavy alcohol use, and noncompliance with antiepileptics at home. We'll continue monitoring and seizure precautions. He is receiving his home antiepileptics since yesterday here. I think EEG to rule out recurrent subclinical seizure activity would be prudent due to slow recovery. He is also had multiple hospital for ED evaluations for altered mental status since his MRI last year so I think this would be a reasonable test for any new epileptogenic focus.  #Alcohol  use disorder  Clinically no evidence of alcohol withdrawal in the past 24 hours. Per history and ED toxicology he probably is outside the window for a severe acute withdrawal.  #Hyponatremia Serum sodium is unimproved after a day of continuous gentle fluid replacement. Urine/serum osmolality tests are not definitive for SIADH versus low solute state as causes. We will start him on a fluid restricted diet.  #Protein-calorie malnutrition, severe #Hypokalemia We will continue encouraging food intake and replacing these metabolic derangements is as needed.  #Chronic diastolic heart failure, NYHA class 1 (HCC) #HTN Not in acute exacerbation  #Thrombocytopenia Plts 115->106. This is a chronic problem and we will monitor daily but no interventions at this time.   Dispo: Anticipated discharge pending further evaluation and clinical improvement, possibly 1-3 day(s).   Collier Salina, MD PGY-II Internal Medicine Resident Pager# [phone] 07/07/2016, 3:56 PM

## 2016-07-07 NOTE — Progress Notes (Signed)
EEG Completed; Results Pending  

## 2016-07-07 NOTE — Progress Notes (Signed)
Subjective: Miguel Hanson reports that he is feeling well this morning. His communication is certainly improved from yesterday -- he speaks full sentences such as "I am feeling a lot better" rather than perseverating on "number one, number two" -- but he still appears to be fairly confused. He states that he had been feeling well in the weeks leading up to his hospitalization and that he thinks that he had a seizure yesterday morning.  Objective: Vital signs in last 24 hours: Vitals:   07/06/16 1438 07/06/16 2300 07/06/16 2330 07/07/16 0502  BP: 135/87 100/65 124/78 103/73  Pulse: 94 75 86 77  Resp: 14  18 18   Temp: 98.6 F (37 C)  98.5 F (36.9 C) 98.2 F (36.8 C)  TempSrc: Oral  Oral Oral  SpO2: 100% 95% 100% 96%  Weight: 56.7 kg (125 lb)     Height: 5\' 3"  (1.6 m)      Physical Exam: General: Pleasant but confused frail-appearing man sitting up in bed. HEENT: Head normocephalic, atraumatic. Extraocular movements grossly intact, although difficult to assess fully due to difficulty following commands. Moist mucus membranes. Cardiovascular: Regular rate and rhythm. No murmurs, rubs, or gallops. Pulmonary: Clear to auscultation bilaterally. Normal work of breathing. Abdominal: Soft, non-tender. Extremities: Warm and dry. No lower extremity edema. Neurologic: Expressive aphasia, improved from yesterday. Now using full phrases rather than "number one, number two" and speech is somewhat logical but still perseverates on certain words. Impaired attention. Not oriented to place. Moves all extremities well. No focal deficits noted but difficult to fully assess due to difficulty following commands.  Lab Results: Basic Metabolic Panel:  Recent Labs Lab 07/06/16 1903 07/07/16 0527  NA 125* 126*  K 3.2* 2.9*  CL 86* 91*  CO2 30 25  GLUCOSE 107* 103*  BUN <5* <5*  CREATININE 0.64 0.72  CALCIUM 8.7* 8.2*  MG 1.9 1.8   Liver Function Tests:  Recent Labs Lab 07/06/16 1000  07/07/16 0527  AST 60* 42*  ALT 26 23  ALKPHOS 61 54  BILITOT 1.3* 1.6*  PROT 6.8 6.4*  ALBUMIN 3.9 3.6   CBC:  Recent Labs Lab 07/06/16 1000 07/07/16 0527  WBC 6.2 6.7  HGB 14.9 14.0  HCT 38.8* 35.6*  MCV 93.9 94.7  PLT 115* 106*   Coagulation:  Recent Labs Lab 07/06/16 1903  LABPROT 15.1  INR 1.18   Alcohol Level:  Recent Labs Lab 07/06/16 1053  [place] <5   Urinalysis:  Recent Labs Lab 07/06/16 0940  COLORURINE STRAW*  LABSPEC 1.006  PHURINE 8.0  GLUCOSEU 50*  HGBUR NEGATIVE  BILIRUBINUR NEGATIVE  KETONESUR NEGATIVE  PROTEINUR NEGATIVE  NITRITE NEGATIVE  LEUKOCYTESUR NEGATIVE   Assessment/Plan: Problem List: -- Altered mental status -- Seizure disorder -- Polysubstance abuse (alcohol, marijuana) -- PTSD -- HTN -- COPD -- Chronic diastolic HF, NYHA class 1 -- Thrombocytopenia -- Hyponatremia  Assessment and Plan by Problem: #Altered mental status #Seizure disorder Most likely post-ictal state following an unwitnessed seizure secondary to treatment nonadherence and/or alcohol withdrawal and/or hyponatremia. Neurologic exam continues to appear nonfocal, although difficult to assess fully due to difficulty following commands. Mental status has certainly improved since yesterday but if his wife's description of his baseline mental status is accurate, he is not yet back to his baseline. -- Neuro checks -- Home Keppra and Trileptal  -- F/u 10-hydroxycarbazepine level -- Seizure precautions -- Consider consulting Neurology given mental status has not yet returned to baseline -- Consider EEG or head CT/MRI given  mental status has not yet returned to baseline  #Polysubstance abuse (alcohol, marijuana) #PTSD -- CIWA protocol -- Thiamine, folate, and multivitamin supplementation  #HTN -- Continue home metoprolol  #COPD -- Not on home medications; continue to monitor  #Chronic diastolic HF, NYHA class 1 EF 55-60% with grade 1 diastolic  dysfunction as noted on echo 05/08/14. -- Compensated; continue to monitor  #Thrombocytopenia Platelet count 115 on 5/7; 119 last July. 106 today. Appears chronic; likely due to alcoholism. -- Monitor CBC for worsening  #Hyponatremia Serum sodium 125 on 5/7; serum osmoles 261 and urine osmoles 317. Serum sodium 126 on 5/8 status post 1,066mL NS. Possible explanations include hypovolemia, beer potomania, and SIADH. Hyponatremia secondary to hypovolemia would generally have resolved with NS; hyponatremia secondary to SIADH would generally have worsened. With SIADH, urine generally would have been more concentrated. Therefore difficult to discern an exact cause at this time. Will work-up further and reassess. -- Repeat serum and urine osmoles  FENGI: DYS3 DVT PPX: Lovenox subcut Code Status: Presume full code Dispo: Patient requires continued management by the Internal Medicine teaching service for management of altered mental status.   This is a Careers information officer Note.  The care of the patient was discussed with Dr. Benjamine Mola and the assessment and plan formulated with their assistance.  Please see their attached note for official documentation of the daily encounter.   LOS: 1 day   Madelynn Done, Medical Student 07/07/2016, 11:31 AM

## 2016-07-07 NOTE — Consult Note (Signed)
           [facility] CM Primary Care Navigator  07/07/2016  Miguel Hanson 01-Oct-1945 [id number]    Went to see patientin the room to identify possible discharge needsbut staffreports that patient is off the unit for a procedure (EEG) at this time.   Will attempt to meet with patient at another time when he is available in the room.   For questions, please contact:  Dannielle Huh, BSN, RN- [facility] Primary Care Navigator  Telephone: [phone] [name]

## 2016-07-07 NOTE — Progress Notes (Signed)
All night time meds were scheduled by mouth, but patient was NPO. Paged Blum,MD. Blum,MD returned page and replaced all PO meds with IV meds. MD asked RN to re-do swallow evaluation. RN completed the swallow screen and patient failed due to being unable to touch his lips with his tongue. Patient is still NPO but with sips with meds. Patient's BP was also 100/65 and patient had scheduled 10mg  of IV metoprolol. Per MD, RN held scheduled IV metoprolol. Will continue to monitor and treat per MD orders.

## 2016-07-08 ENCOUNTER — Inpatient Hospital Stay (HOSPITAL_COMMUNITY): Payer: Medicare Other

## 2016-07-08 LAB — BASIC METABOLIC PANEL
Anion gap: 11 (ref 5–15)
CALCIUM: 8.9 mg/dL (ref 8.9–10.3)
CO2: 24 mmol/L (ref 22–32)
CREATININE: 0.66 mg/dL (ref 0.61–1.24)
Chloride: 93 mmol/L — ABNORMAL LOW (ref 101–111)
GFR calc Af Amer: >60 mL/min (ref >60)
GLUCOSE: 135 mg/dL — AB (ref 65–99)
Potassium: 3.4 mmol/L — ABNORMAL LOW (ref 3.5–5.1)
Sodium: 128 mmol/L — ABNORMAL LOW (ref 135–145)

## 2016-07-08 LAB — CORTISOL-AM, BLOOD: Cortisol - AM: 20.7 ug/dL (ref 6.7–22.6)

## 2016-07-08 LAB — TSH: TSH: 0.511 u[IU]/mL (ref 0.350–4.500)

## 2016-07-08 MED ORDER — LEVETIRACETAM 1000 MG PO TABS: 1000.0000 mg | ORAL_TABLET | Freq: Two times a day (BID) | ORAL | 2 refills | Status: AC

## 2016-07-08 MED ORDER — ASPIRIN 81 MG PO TABS: 81.0000 mg | ORAL_TABLET | Freq: Every day | ORAL | Status: AC

## 2016-07-08 MED ORDER — OXCARBAZEPINE 600 MG PO TABS: 600.0000 mg | ORAL_TABLET | Freq: Two times a day (BID) | ORAL | 2 refills | Status: AC

## 2016-07-08 NOTE — Progress Notes (Signed)
Patient midnight BP was 101/75 and HR 81. Paged Blum,MD. MD returned page and discontinued order for IV lopressor. Will continue to monitor and treat per MD orders.

## 2016-07-08 NOTE — Evaluation (Addendum)
Physical Therapy Evaluation Patient Details Name: Miguel Hanson MRN: [id number] DOB: 08/03/1945 Today's Date: 07/08/2016   History of Present Illness  PAtient is a 71 y/o male who presents with AMS. Numerous prior hospital admissions for altered mental status, including twice in the past year for AMS due to noncompliance for meds.  Found to have seizure. PMH includes HTN, polysubstance abuse, CVA, hepatic encephalopathy, ETOH abuse, question underlying Wernicke's/korsaloff syndrome, PTSD, seizure disorder.  Clinical Impression  Patient presents with balance deficits, generalized weakness, ? cognitive deficits which could be baseline and impaired mobility s/p above. Tolerated gait training with Min A for safety/balance due to staggering at times. This was pt's first time out of bed since admission. Encouraged use of RW at home until strength improves. Pt agreeable. Pt Mod I PTA and cares for wife who has MS and is bed bound. If pt is to d/c home today, will need follow up HHPT to maximize independence and mobility so pt can return to PLOF. Will follow acutely.    Follow Up Recommendations Home health PT;Supervision - Intermittent;Other (comment)    Equipment Recommendations  None recommended by PT    Recommendations for Other Services       Precautions / Restrictions Precautions Precautions: Fall Restrictions Weight Bearing Restrictions: No      Mobility  Bed Mobility Overal bed mobility: Needs Assistance Bed Mobility: Supine to Sit;Sit to Supine     Supine to sit: Modified independent (Device/Increase time);HOB elevated Sit to supine: Modified independent (Device/Increase time);HOB elevated   General bed mobility comments: No assist needed.   Transfers Overall transfer level: Needs assistance Equipment used: None Transfers: Sit to/from Stand Sit to Stand: Min guard         General transfer comment: Min guard for safety.   Ambulation/Gait Ambulation/Gait assistance: Min  assist Ambulation Distance (Feet): 200 Feet Assistive device: None Gait Pattern/deviations: Step-through pattern;Decreased stride length;Staggering left;Staggering right Gait velocity: decreased Gait velocity interpretation: Below normal speed for age/gender General Gait Details: Slow, mildly unsteady gait with no overt LOB but staggering noted to right/left. Recommend using RW at home, pt agrees. HR up to 120s bpm.  Stairs            Wheelchair Mobility    Modified Rankin (Stroke Patients Only)       Balance Overall balance assessment: Needs assistance Sitting-balance support: Feet supported;No upper extremity supported Sitting balance-Leahy Scale: Fair     Standing balance support: During functional activity Standing balance-Leahy Scale: Fair Standing balance comment: Able to stand statically without UE support, some staggering without UE support during dynamic standing activities.                             Pertinent Vitals/Pain Pain Assessment: No/denies pain    Home Living Family/patient expects to be discharged to:: Private residence Living Arrangements: Spouse/significant other Available Help at Discharge: Family;Available PRN/intermittently Type of Home: House Home Access: Ramped entrance     Home Layout: One level Home Equipment: Walker - 2 wheels;Cane - single point;Wheelchair - manual      Prior Function Level of Independence: Independent with assistive device(s)         Comments: Uses SPC for ambulation. Does cooking, cleaning. Wife is non ambulatory due to MS and has an aide assist with bathing, transfers etc. Rides the bus, does not drive.     Hand Dominance   Dominant Hand: Right    Extremity/Trunk Assessment  Upper Extremity Assessment Upper Extremity Assessment: Defer to OT evaluation    Lower Extremity Assessment Lower Extremity Assessment: Generalized weakness       Communication   Communication: No difficulties   Cognition Arousal/Alertness: Awake/alert Behavior During Therapy: WFL for tasks assessed/performed Overall Cognitive Status: No family/caregiver present to determine baseline cognitive functioning                                 General Comments: Repeats same answers to different questions asked. Reports not feeling quite at baseline. A&0x4,. "you know, you know what I mean."      General Comments      Exercises     Assessment/Plan    PT Assessment Patient needs continued PT services  PT Problem List Decreased strength;Decreased mobility;Decreased balance;Decreased activity tolerance;Cardiopulmonary status limiting activity;Decreased cognition       PT Treatment Interventions Therapeutic activities;Gait training;Therapeutic exercise;Patient/family education;Balance training;Functional mobility training    PT Goals (Current goals can be found in the Care Plan section)  Acute Rehab PT Goals Patient Stated Goal: to go home PT Goal Formulation: With patient Time For Goal Achievement: 07/22/16 Potential to Achieve Goals: Fair    Frequency Min 3X/week   Barriers to discharge Decreased caregiver support cares for wife who is bedbound    Co-evaluation               AM-PAC PT "6 Clicks" Daily Activity  Outcome Measure Difficulty turning over in bed (including adjusting bedclothes, sheets and blankets)?: None Difficulty moving from lying on back to sitting on the side of the bed? : None Difficulty sitting down on and standing up from a chair with arms (e.g., wheelchair, bedside commode, etc,.)?: None Help needed moving to and from a bed to chair (including a wheelchair)?: None Help needed walking in hospital room?: A Little Help needed climbing 3-5 steps with a railing? : A Little 6 Click Score: 22    End of Session Equipment Utilized During Treatment: Gait belt Activity Tolerance: Patient tolerated treatment well Patient left: in bed;with call  bell/phone within reach;with bed alarm set Nurse Communication: Mobility status PT Visit Diagnosis: Unsteadiness on feet (R26.81);Muscle weakness (generalized) (M62.81)    Time: 5465-0354 PT Time Calculation (min) (ACUTE ONLY): 15 min   Charges:   PT Evaluation $PT Eval Low Complexity: 1 Procedure     PT G Codes:       Wray Kearns, PT, DPT [phone]    Marguarite Arbour A Jaylinn Hellenbrand 07/08/2016, 3:42 PM

## 2016-07-08 NOTE — Progress Notes (Signed)
Patient pulled out IV at 2130 and a transporter came to get patient for MRI. RN called MRI and told them about what was going on and MRI just told RN to call when the patient was more stable and had an IV. RN called MRI at this time and spoke with Thurmond Butts. Ryan told RN that he would add the patient back to the list that patient is ready to come down to MRI, but that it would probably be awhile. Will continue to monitor and treat per MD orders.

## 2016-07-08 NOTE — Progress Notes (Addendum)
   Subjective: Mr. Ivey is more alert today and able to answer questions appropriately. He still has trouble maintaining attention and removed his IV overnight while confused. MRI head was initially delayed while confused but was completed this morning.  Objective:  Vital signs in last 24 hours: Vitals:   07/07/16 0502 07/07/16 1312 07/08/16 0000 07/08/16 0634  BP: 103/73 136/88 101/75 124/89  Pulse: 77 73 81 95  Resp: 18 20 18 18   Temp: 98.2 F (36.8 C) 98.8 F (37.1 C) 98.3 F (36.8 C) 98.6 F (37 C)  TempSrc: Oral Oral Oral   SpO2: 96% 99% 100% 100%  Weight:      Height:       GENERAL- Chronically malnourished appearing man appears comfortable in NAD CARDIAC- RRR NEURO- he is moving all extremities spontaneously, speech is intelligible and mostly appropriate with some word searching, attentional deficits with multiple step questions EXTREMITIES- warm, thin, no pedal edema  Assessment/Plan: #Acute encephalopathy #Seizure disorder His mental status has continued to improve significantly since yesterday. EEG ruled out ongoing seizures and MRI revealed no new acute process. This is most consistent with recovery after a seizure at home. He has multiple risk factors including hyponatremia, heavy alcohol use, and noncompliance with antiepileptics at home.  We have resumed his AEDs and will send him home on these. He would benefit from alcohol use reduction we will offer treatment with naltrexone if he is agreeable at discharge. We will encourage free water restriction and decreased EtOH use as an initial for hyponatremia. We will have PT/OT evaluate him today for assistance needs at discharge. He will need to follow up with his [facility] provider soon.  #Alcohol use disorder  Offered naltrexone for assistance in alcohol use reduction. He was uncertain about this and will discuss again this afternoon.  #Hyponatremia Indeterminate serum and urine studies yesterday for SIADH. Plan  for treatment with EtOH and free water restrictions at home and outpatient follow up.   Dispo: Possible discharge this afternoon pending repeat counseling, PT/OT evaluation  Collier Salina, MD PGY-II Internal Medicine Resident Pager# [phone] 07/08/2016, 11:51 AM

## 2016-07-08 NOTE — Progress Notes (Signed)
Transitions of Care Pharmacy Note  Plan:  Educated on importance of antiepileptic medications and semi-frequent EKG monitoring  Outpatient follow-up of relatively frequent EKG monitoring given prolonged QTc (503 on admission) and oxcarbazepine (unclear evidence on QTc prolongation)  --------------------------------------------- Miguel Hanson is an 71 y.o. male who presents with a chief complaint of AMS. In anticipation of discharge, pharmacy has reviewed this patient's prior to admission medication history, as well as current inpatient medications listed per the [facility].  Current medication indications, dosing, frequency, and notable side effects reviewed with patient. Patient verbalized understanding of current inpatient medication regimen and is aware that the After Visit Summary when presented, will represent the most accurate medication list at discharge.   Miguel Hanson expressed no concerns regarding his medications.   Assessment: Understanding of regimen: fair Understanding of indications: fair Potential of compliance: fair Barriers to Obtaining Medications: No  Patient instructed to contact inpatient pharmacy team with further questions or concerns if needed.    Time spent preparing for discharge counseling: 10 min Time spent counseling patient: 10 min   Thank you for allowing pharmacy to be a part of this patient's care.  Belia Heman, PharmD PGY1 Pharmacy Resident 07/08/2016 6:11 PM

## 2016-07-08 NOTE — Progress Notes (Addendum)
Pt & family given discharge instructions, prescriptions, and care notes. Pt verbalized understanding AEB no further questions or concerns at this time. IV was discontinued, no redness, pain, or swelling noted at this time. Telemetry discontinued and Centralized Telemetry was notified. Pt left the floor via wheelchair with staff in stable condition.  

## 2016-07-08 NOTE — Progress Notes (Signed)
Subjective: Miguel Hanson tells me that he is feeling well this morning. He converses with me without as much difficulty as yesterday, although he still perseverates on some phrases ("You know me" / "You know what I mean"). He does state that he tends to get confused for "a few days" following his seizures and that he does not consistently take his antiepileptics.  Objective: Vital signs in last 24 hours: Vitals:   07/07/16 0502 07/07/16 1312 07/08/16 0000 07/08/16 0634  BP: 103/73 136/88 101/75 124/89  Pulse: 77 73 81 95  Resp: 18 20 18 18   Temp: 98.2 F (36.8 C) 98.8 F (37.1 C) 98.3 F (36.8 C) 98.6 F (37 C)  TempSrc: Oral Oral Oral   SpO2: 96% 99% 100% 100%  Weight:      Height:       General: Pleasant, slightly confused frail-appearing man sitting up in bed. HEENT: Head normocephalic, atraumatic. Extraocular movements intact. Moist mucus membranes. Cardiovascular: Regular rate and rhythm. No murmurs, rubs, or gallops. Pulmonary: Clear to auscultation bilaterally. Normal work of breathing. Abdominal: Soft, non-tender. Extremities: Warm and dry. No lower extremity edema. Neurologic: Oriented to place. Expressive aphasia, improved from yesterday. Speech is often logical, but he still perseverates on certain words and does not always answer questions appropriately. Is generally able to follow commands. 5/5 strength in upper and lower extremities bilaterally. Finger to nose and rapid alternating movements intact.  Lab Results: Basic Metabolic Panel:  Recent Labs Lab 07/06/16 1903 07/07/16 0527 07/07/16 1402 07/08/16 0853  NA 125* 126* 126* 128*  K 3.2* 2.9* 3.7 3.4*  CL 86* 91* 93* 93*  CO2 30 25 22 24   GLUCOSE 107* 103* 130* 135*  BUN <5* <5* <5* <5*  CREATININE 0.64 0.72 0.56* 0.66  CALCIUM 8.7* 8.2* 8.4* 8.9  MG 1.9 1.8  --   --    Thyroid Function Tests:  Recent Labs Lab 07/08/16 0853  TSH 0.511   Studies/Results: [name] Contrast  Result Date:  07/08/2016 IMPRESSION: 1. No acute finding or change from 06/27/2015. 2. Generalized cortical atrophy and moderate chronic small vessel ischemia. Electronically Signed   By: Miguel Hanson [name]   On: 07/08/2016 08:40   Problem List: -- Acute encephalopathy -- Seizure disorder -- Polysubstance abuse (alcohol, marijuana) -- PTSD -- HTN -- COPD -- Chronic diastolic HF, NYHA class 1 -- Thrombocytopenia -- Hyponatremia  Assessment and Plan by Problem: #Acute encephalopathy #Seizure disorder Mental status has certainly improved since yesterday and since his admission. He still seems somewhat confused and seems to have some difficulties in understanding. He was following commands well enough to complete a full neurological exam today with no focal deficits. EEG without epileptiform dischages rules out subclinical status epilepticus. MRI without acute change from prior. Prolonged post-ictal state following an unwitnessed seizure secondary to treatment nonadherence and/or alcohol withdrawal and/or hyponatremia remains most likely cause of acute encephalopathy. It is quite possible that current mental status is his baseline, especially given ~40-year history of chronic alcohol use.  -- Home Keppra and Trileptal  -- Seizure precautions -- PT/OT evaluations prior to discharge  #Polysubstance abuse (alcohol, marijuana) #PTSD Does not appear to be in withdrawal. Counseled on the importance of abstinence. Offered pharmacological assistance with this and patient said he would consider it. -- CIWA protocol -- Thiamine, folate, and multivitamin supplementation  #HTN Currently normotensive --Holding home metoprolol  #COPD -- Not on home medications; continue to monitor  #Chronic diastolic HF, NYHA class 1 EF 55-60%  with grade 1 diastolic dysfunction as noted on echo 05/08/14. -- Compensated; continue to monitor  #Thrombocytopenia Platelet count 115 on 5/7; 119 last July. 106 on 5/8. Appears  chronic; likely due to alcoholism. -- Monitor CBC for worsening  #Hyponatremia Slight increase to 128 today. TSH within normal limits. Will encourage 1500 mL fluid restriction upon discharge.  FENGI: DYS3 DVT PPX: Lovenox subcut Code Status: Presume full code Dispo: Given improvement in mental status and normal EEG/MRI, patient is stable for discharge following PT/OT evaluations this afternoon. Home health would be ideal in order to ensure medication compliance and avoid rehospitalization. PCP follow-up at the [facility] will be important as well.   This is a Careers information officer Note.  The care of the patient was discussed with Dr. Benjamine Mola and the assessment and plan formulated with their assistance.  Please see their attached note for official documentation of the daily encounter.   LOS: 2 days   Madelynn Done, Medical Student 07/08/2016, 11:30 AM

## 2016-07-08 NOTE — Discharge Summary (Signed)
Name: Miguel Hanson MRN: [id number] DOB: 23-Oct-1945 71 y.o. PCP: Lucianne Lei, MD  Date of Admission: 07/06/2016  9:26 AM Date of Discharge: 07/08/2016 Attending Physician: Lalla Brothers T.  Discharge Diagnosis: Principal Problem:   Acute encephalopathy Active Problems:   Seizure disorder (HCC)   Alcohol abuse   COPD (chronic obstructive pulmonary disease) (HCC)   HTN (hypertension), malignant   Protein-calorie malnutrition, severe (HCC)   Chronic diastolic heart failure, NYHA class 1 (HCC)   Thrombocytopenia (HCC)   Hypokalemia   Hypomagnesemia   Discharge Medications: Allergies as of 07/08/2016   No Known Allergies     Medication List    STOP taking these medications   docusate sodium 100 MG capsule Commonly known as:  COLACE     TAKE these medications   aspirin 81 MG tablet Take 1 tablet (81 mg total) by mouth daily. What changed:  medication strength  how much to take   atorvastatin 40 MG tablet Commonly known as:  LIPITOR Take 1 tablet (40 mg total) by mouth daily at 6 PM.   feeding supplement (ENSURE ENLIVE) Liqd Take 237 mLs by mouth 3 (three) times daily between meals.   folic acid 1 MG tablet Commonly known as:  FOLVITE Take 1 tablet (1 mg total) by mouth daily.   levETIRAcetam 1000 MG tablet Commonly known as:  KEPPRA Take 1 tablet (1,000 mg total) by mouth 2 (two) times daily.   metoprolol tartrate 25 MG tablet Commonly known as:  LOPRESSOR Take 0.5 tablets (12.5 mg total) by mouth 2 (two) times daily.   multivitamin with minerals Tabs tablet Take 1 tablet by mouth daily.   oxcarbazepine 600 MG tablet Commonly known as:  TRILEPTAL Take 1 tablet (600 mg total) by mouth 2 (two) times daily.   thiamine 100 MG tablet Take 1 tablet (100 mg total) by mouth daily.       Disposition and follow-up:   MiguelMiguel Hanson was discharged from [facility] in Good condition.  At the hospital follow up visit please address:  1.  Seizure disorder: Seizures appear to be multifactorial from EtOH abuse, hyponatremia, and medication noncompliance. Pleas make sure he has continued access to his antiepileptic medications.  2.  EtOH use disorder: Mr. Ransom was indecisive about starting naltrexone for help with reducing his alcohol use at time of discharge but said he would consider it.  3.  Hypnotremia: Please check Bmet and would recommend he limit free water intake, which by history is also largely from beer.  4.  Labs / imaging needed at time of follow-up: Basic metabolic panel   Follow-up Appointments:   Follow up appointment with Dr. Fransico Setters office on 5/16  Hospital Course by problem list: 1. Acute Encephalopathy Miguel Hanson presented to the ED 5/7 via EMS for altered mental status. Upon admission he had severe expressive aphasia, repeating the words "number one" and "number two" in response to all questions. Neurologic exam was limited by his difficulty understanding and following commands, but it appeared to be nonfocal. He was restarted on his home antiepileptics (Keppra and Trileptal) and his mental status gradually improved over the course of his hospital stay. EEG on 5/8 was without epileptiform discharges, ruling out subclinical status epilepticus, and MRI on 5/9 showed generalized cortical atrophy with no acute change from prior. By 5/9, he was able to converse and answer some questions logically but still repeated certain phrases "you know me" / "you know what I mean". It was concluded that  his acute encephalopathy was likely due to a prolonged post-ictal state following a seizure in the setting of medication noncompliance / alcohol use / hyponatremia. He was discharged with referral for home health PT.  2. Seizure Disorder Miguel Hanson wife reported that he is not compliant with his antiepileptic medications. Serum levels for metabolites of oxcarbazepine were checked and actually at a therapeutic range. He had  received a dose of medications prior to testing however and the utility of this assay does not have well established clinical significance.  3. Alcohol Use Disorder Miguel Hanson was placed on CIWA protocol upon admission and did not appear to be in withdrawal at any time during his hospitalization. He was offered naltrexone upon admission but he declined. He was counseled on the importance of abstinence.  4. Hyponatremia Miguel Hanson's sodium upon admission was 125. This did not improve with normal saline administration. Serum and urine studies were indeterminate for SIADH. TSH and morning cortisol were within normal limits. He was placed on free water restriction with plans for free water and alcohol restrictions at home and outpatient follow-up.   Discharge Vitals:   BP 126/84 (BP Location: Right Arm)   Pulse (!) 102   Temp 98.2 F (36.8 C) (Oral)   Resp 20   Ht 5\' 3"  (1.6 m)   Wt 125 lb (56.7 kg)   SpO2 100%   BMI 22.14 kg/m   Pertinent Labs, Studies, and Procedures:  MRI head 5/9 demonstrating no interval changes compared to 06/27/15. EEG 5/8 demonstrated no continuing seizure activity.  Discharge Instructions: Discharge Instructions    Call MD for:    Complete by:  As directed    Seizure, loss of consciousness, or increased confusion   Diet - low sodium heart healthy    Complete by:  As directed    Discharge instructions    Complete by:  As directed    You were admitted to the hospital after suffering a seizure at home. You are at risk for seizures because of not taking your home anti-seizure medication and continued alcohol use. If you start taking your medication again and can decrease drinking you will be at less risk for repeat seizures.  We also recommend you try to limit the total amount of water and alcohol you are drinking daily to 2 bottles or less. This will also help to limit your risk for repeat seizure.   Face-to-face encounter (required for Medicare/Medicaid  patients)    Complete by:  As directed    [name] certify that this patient is under my care and that I, or a nurse practitioner or physician's assistant working with me, had a face-to-face encounter that meets the physician face-to-face encounter requirements with this patient on 07/08/2016. The encounter with the patient was in whole, or in part for the following medical condition(s) which is the primary reason for home health care (List medical condition): Seizure disorder with falls, malnutrition and deconditioning   The encounter with the patient was in whole, or in part, for the following medical condition, which is the primary reason for home health care:  Seizure disorder with falls   I certify that, based on my findings, the following services are medically necessary home health services:  Physical therapy   Reason for Medically Necessary Home Health Services:  Therapy- Home Adaptation to Facilitate Safety   My clinical findings support the need for the above services:  Unsafe ambulation due to balance issues   Further, I  certify that my clinical findings support that this patient is homebound due to:  Unable to leave home safely without assistance   Home Health    Complete by:  As directed    To provide the following care/treatments:   PT OT     Increase activity slowly    Complete by:  As directed       Signed: Collier Salina, MD PGY-II Internal Medicine Resident Pager# [phone] 07/11/2016, 1:51 AM

## 2016-07-09 LAB — 10-HYDROXYCARBAZEPINE: Triliptal/MTB(Oxcarbazepin): 24 ug/mL (ref 10–35)

## 2016-07-09 NOTE — Consult Note (Signed)
           [facility] CM Primary Care Navigator  07/09/2016  Miguel Hanson 11-May-1945 [id number]   Jeryl Columbia to see patient earlier today at the bedside to identify possible discharge needs but he was alreadydischarged per staff report.   Primary care provider's office called Miguel Hammans, RN)to notify of patient's discharge and need for post hospitalfollow-up and transition of care. Notified also of patient's health needs/ issues needing follow-up. Miguel Hanson reports that patient has an appointment scheduled for Wednesday 5/16 for follow-up.  Made aware to refer patient to [facility] care management ifdeemed appropriatefor services.    For questions, please contact:  Dannielle Huh, BSN, RN- [facility] Primary Care Navigator  Telephone: [phone] [place]

## 2016-07-15 DIAGNOSIS — E43 Unspecified severe protein-calorie malnutrition: Secondary | ICD-10-CM | POA: Diagnosis not present

## 2016-07-15 DIAGNOSIS — I10 Essential (primary) hypertension: Secondary | ICD-10-CM | POA: Diagnosis not present

## 2016-07-15 DIAGNOSIS — E871 Hypo-osmolality and hyponatremia: Secondary | ICD-10-CM | POA: Diagnosis not present

## 2016-07-15 DIAGNOSIS — N189 Chronic kidney disease, unspecified: Secondary | ICD-10-CM | POA: Diagnosis not present

## 2016-07-29 DIAGNOSIS — N189 Chronic kidney disease, unspecified: Secondary | ICD-10-CM | POA: Diagnosis not present

## 2016-07-29 DIAGNOSIS — I10 Essential (primary) hypertension: Secondary | ICD-10-CM | POA: Diagnosis not present

## 2016-09-09 DIAGNOSIS — I10 Essential (primary) hypertension: Secondary | ICD-10-CM | POA: Diagnosis not present

## 2016-10-14 DIAGNOSIS — N189 Chronic kidney disease, unspecified: Secondary | ICD-10-CM | POA: Diagnosis not present

## 2016-10-14 DIAGNOSIS — I10 Essential (primary) hypertension: Secondary | ICD-10-CM | POA: Diagnosis not present

## 2016-10-14 DIAGNOSIS — E43 Unspecified severe protein-calorie malnutrition: Secondary | ICD-10-CM | POA: Diagnosis not present

## 2016-11-25 DIAGNOSIS — I10 Essential (primary) hypertension: Secondary | ICD-10-CM | POA: Diagnosis not present

## 2016-11-25 DIAGNOSIS — Z23 Encounter for immunization: Secondary | ICD-10-CM | POA: Diagnosis not present

## 2017-01-27 DIAGNOSIS — I10 Essential (primary) hypertension: Secondary | ICD-10-CM | POA: Diagnosis not present

## 2017-01-27 DIAGNOSIS — N189 Chronic kidney disease, unspecified: Secondary | ICD-10-CM | POA: Diagnosis not present

## 2017-04-01 DIAGNOSIS — I10 Essential (primary) hypertension: Secondary | ICD-10-CM | POA: Diagnosis not present

## 2017-05-03 DIAGNOSIS — N189 Chronic kidney disease, unspecified: Secondary | ICD-10-CM | POA: Diagnosis not present

## 2017-05-03 DIAGNOSIS — I10 Essential (primary) hypertension: Secondary | ICD-10-CM | POA: Diagnosis not present

## 2017-06-16 DIAGNOSIS — I10 Essential (primary) hypertension: Secondary | ICD-10-CM | POA: Diagnosis not present

## 2017-06-16 DIAGNOSIS — E782 Mixed hyperlipidemia: Secondary | ICD-10-CM | POA: Diagnosis not present

## 2017-08-04 DIAGNOSIS — I10 Essential (primary) hypertension: Secondary | ICD-10-CM | POA: Diagnosis not present

## 2017-08-04 DIAGNOSIS — N189 Chronic kidney disease, unspecified: Secondary | ICD-10-CM | POA: Diagnosis not present

## 2017-08-31 IMAGING — MR MR HEAD W/O CM
8 of 10 series · 34 of 48 positions shown · non-contrast
Comparison: 06/27/2015

CLINICAL DATA: Altered mental status.  History of seizures.

EXAM:
MRI HEAD WITHOUT CONTRAST
TECHNIQUE: Multiplanar, multiecho pulse sequences of the brain and surrounding
structures were obtained without intravenous contrast.

[Series 2: FLAIR · sagittal · 5.0mm · 0.47mm/px · 3 of 23 slices shown (1 of 2)]
[im 1/23]
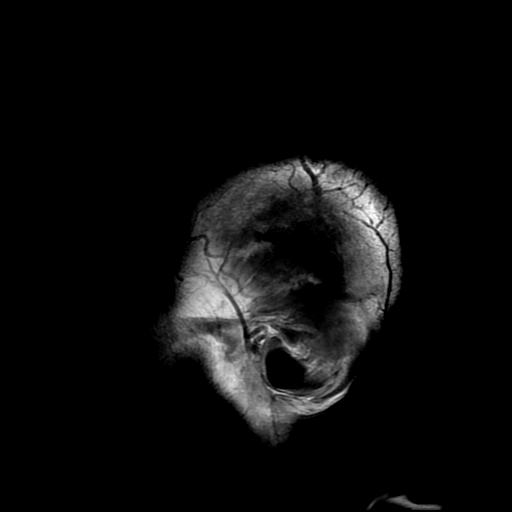
[im 12/23]
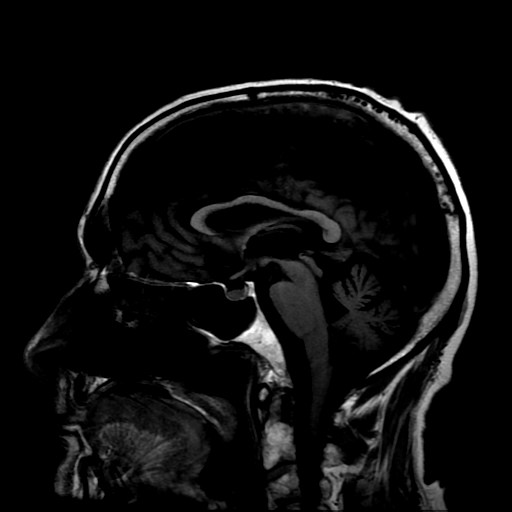
[im 23/23]
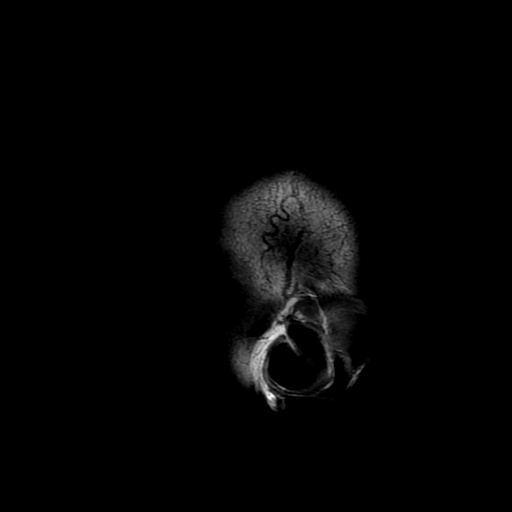

[Series 4: DWI · axial · 3.0mm · 0.94mm/px · z∈[-77,+70]mm · 8 of 100 slices shown (1 of 2)]
[im 1/100]
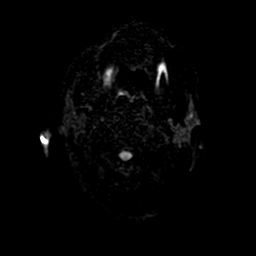
[im 15/100]
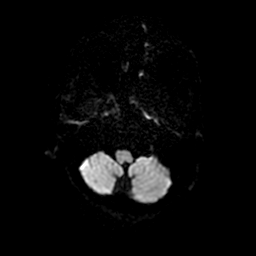
[im 29/100]
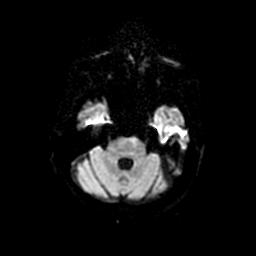
[im 43/100]
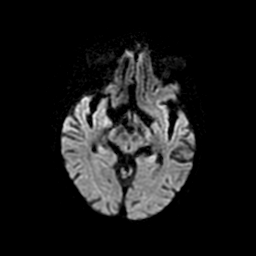
[im 57/100]
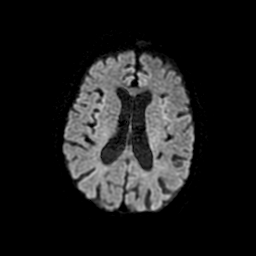
[im 71/100]
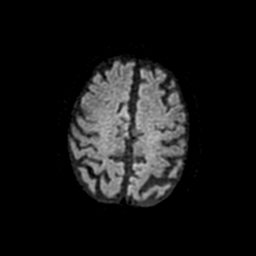
[im 85/100]
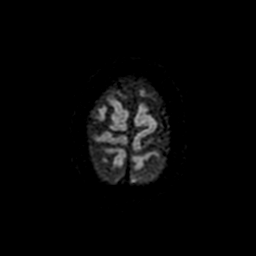
[im 100/100]
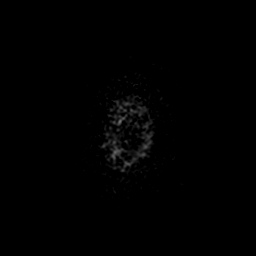

[Series 5: T2 · axial · 5.0mm · 0.47mm/px · z∈[-76,+74]mm · 2 of 26 slices shown]
[im 1/26]
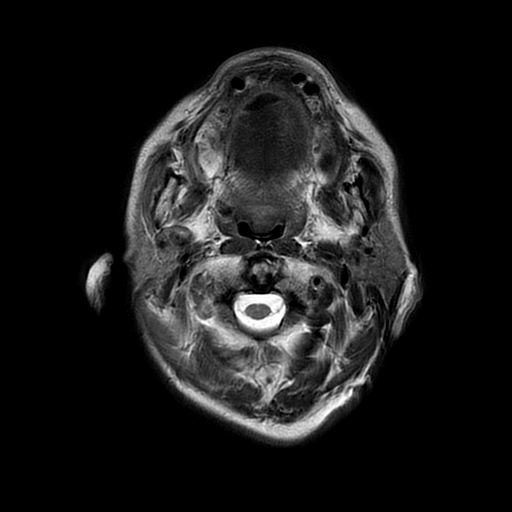
[im 26/26]
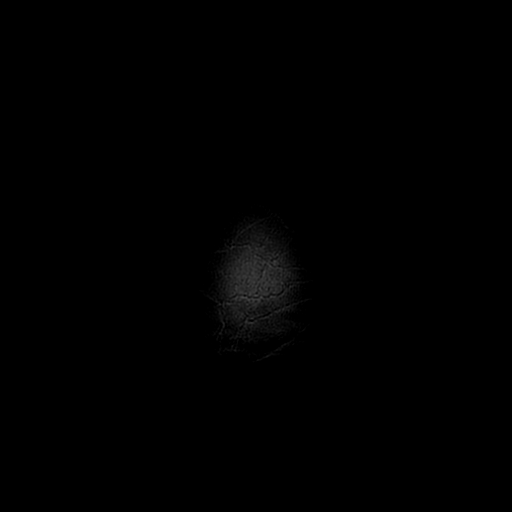

[Series 6: FLAIR · axial · 5.0mm · 0.47mm/px · z∈[-76,+74]mm · 2 of 26 slices shown (2 of 2)]
[im 1/26]
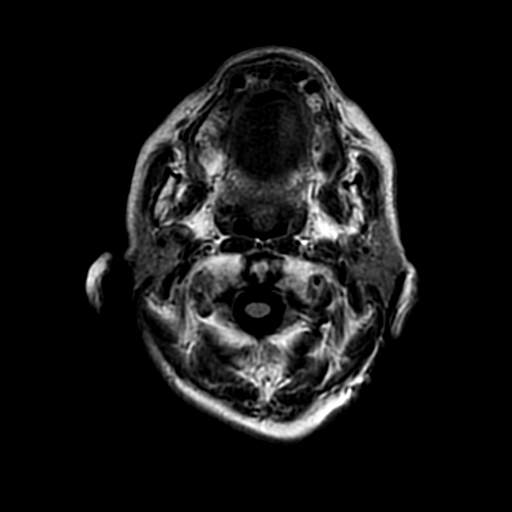
[im 26/26]
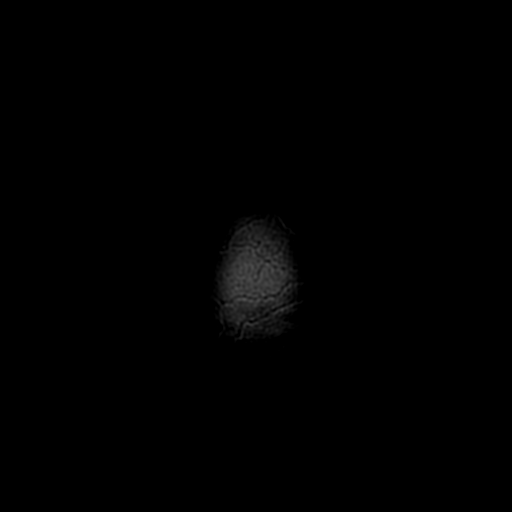

[Series 7: DWI · coronal · 4.0mm · 0.94mm/px · 6 of 68 slices shown (2 of 2)]
[im 1/68]
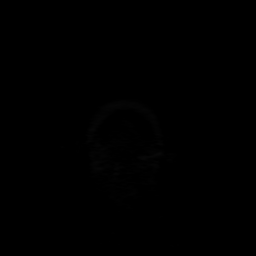
[im 14/68]
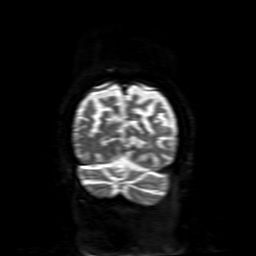
[im 27/68]
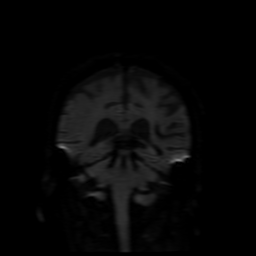
[im 41/68]
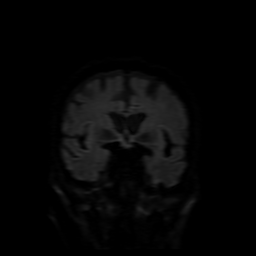
[im 54/68]
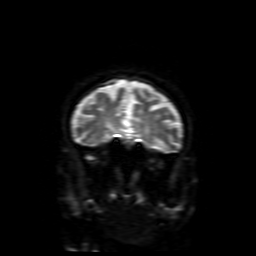
[im 68/68]
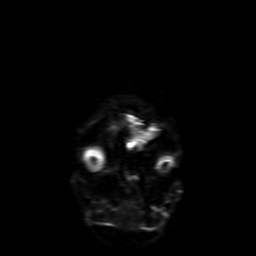

[Series 8: (person_name) · axial · 3.0mm · 0.47mm/px · z∈[-74,+31]mm · 6 of 100 slices shown]
[im 1/100]
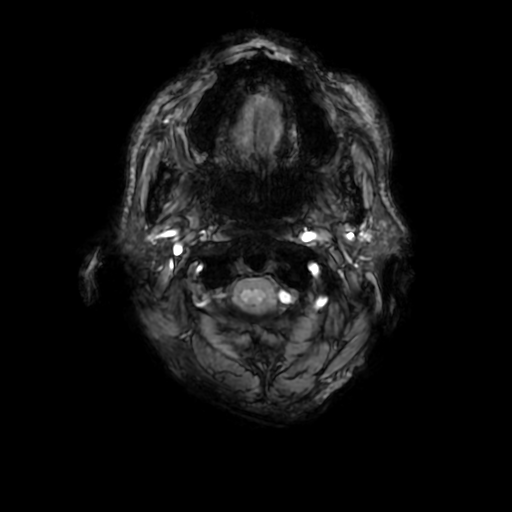
[im 15/100]
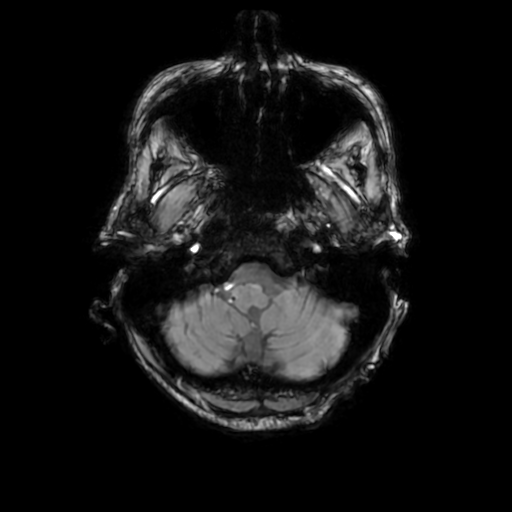
[im 29/100]
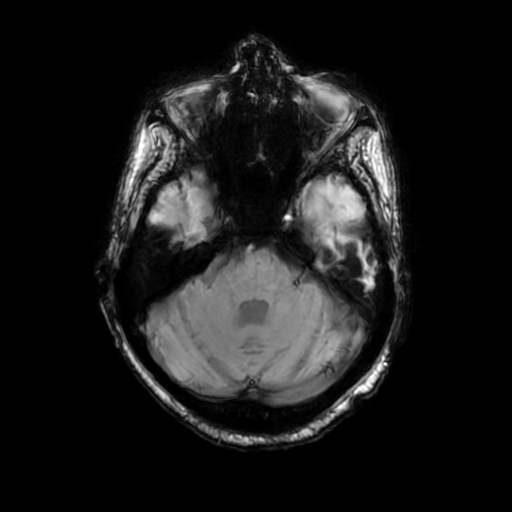
[im 43/100]
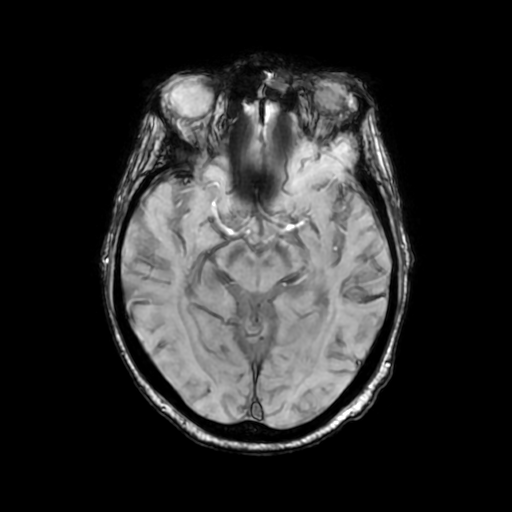
[im 57/100]
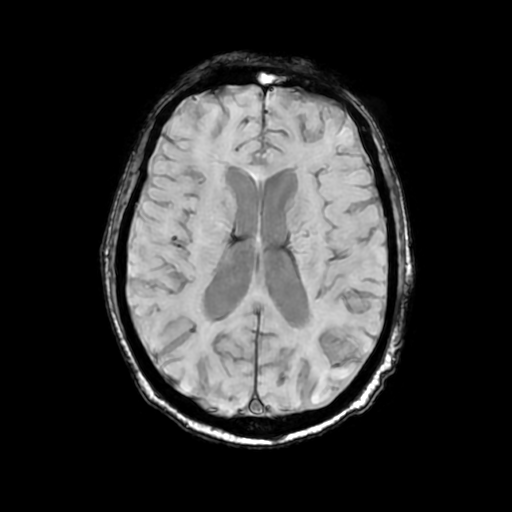
[im 71/100]
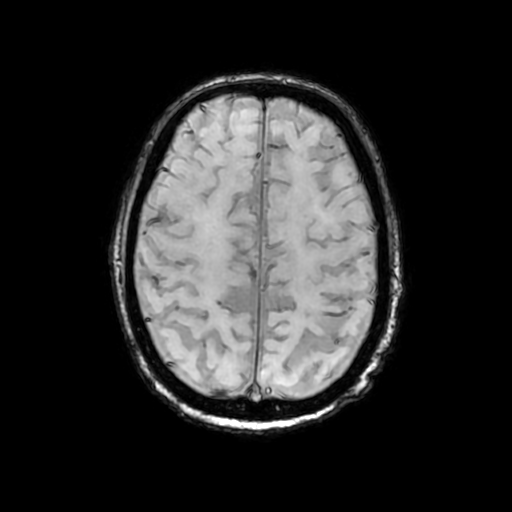

[Series 450: ADC · axial · 3.0mm · 0.94mm/px · z∈[-77,+70]mm · 4 of 49 slices shown (1 of 2)]
[im 1/49]
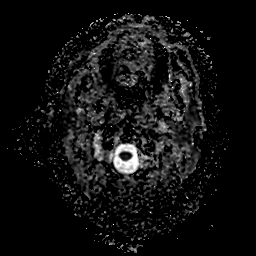
[im 17/49]
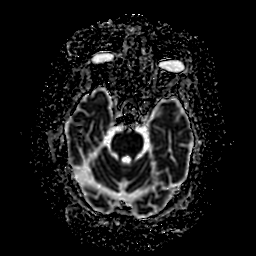
[im 33/49]
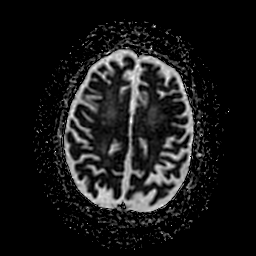
[im 49/49]
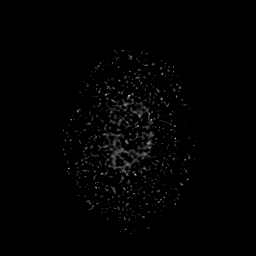

[Series 750: ADC · coronal · 4.0mm · 0.94mm/px · 3 of 34 slices shown (2 of 2)]
[im 1/34]
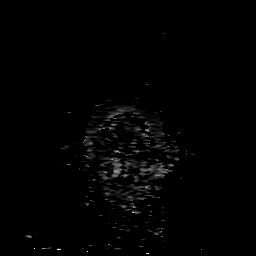
[im 17/34]
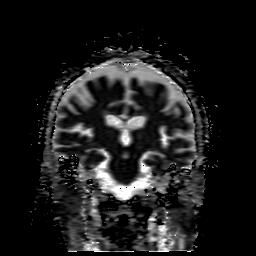
[im 34/34]
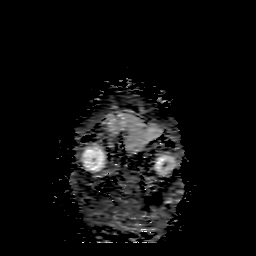

[34 of 48 positions shown; findings below may reference images not displayed]

FINDINGS: Brain: No acute infarction, hemorrhage, hydrocephalus, extra-axial
collection or mass lesion.

Generalized cortical atrophy that is stable and at least moderate.
Moderate chronic microvascular ischemic change in the deep cerebral
white matter. Remote micro hemorrhages in the right pons and left
thalamus, locations affected by hypertensive disease.

Vascular: Major flow voids are preserved

Skull and upper cervical spine: No evidence of marrow lesion.

Sinuses/Orbits: Chronic opacification of the left frontal sinus
without detected expansion, noted since at least 4302 head CT.

Other: Patient terminated exam before coronal T2 weighted imaging
could be obtained.
IMPRESSION: 1. No acute finding or change from 06/27/2015.
2. Generalized cortical atrophy and moderate chronic small vessel
ischemia.

## 2017-10-04 DIAGNOSIS — Z0000 Encounter for general adult medical examination without abnormal findings: Secondary | ICD-10-CM | POA: Diagnosis not present

## 2017-10-04 DIAGNOSIS — N189 Chronic kidney disease, unspecified: Secondary | ICD-10-CM | POA: Diagnosis not present

## 2017-11-04 DIAGNOSIS — Z6822 Body mass index (BMI) 22.0-22.9, adult: Secondary | ICD-10-CM | POA: Diagnosis not present

## 2017-11-04 DIAGNOSIS — E782 Mixed hyperlipidemia: Secondary | ICD-10-CM | POA: Diagnosis not present

## 2017-11-04 DIAGNOSIS — I10 Essential (primary) hypertension: Secondary | ICD-10-CM | POA: Diagnosis not present

## 2017-12-15 DIAGNOSIS — Z6823 Body mass index (BMI) 23.0-23.9, adult: Secondary | ICD-10-CM | POA: Diagnosis not present

## 2017-12-15 DIAGNOSIS — I10 Essential (primary) hypertension: Secondary | ICD-10-CM | POA: Diagnosis not present

## 2017-12-15 DIAGNOSIS — K5900 Constipation, unspecified: Secondary | ICD-10-CM | POA: Diagnosis not present

## 2018-01-26 DIAGNOSIS — I10 Essential (primary) hypertension: Secondary | ICD-10-CM | POA: Diagnosis not present

## 2018-01-26 DIAGNOSIS — Z6823 Body mass index (BMI) 23.0-23.9, adult: Secondary | ICD-10-CM | POA: Diagnosis not present

## 2018-04-06 DIAGNOSIS — I10 Essential (primary) hypertension: Secondary | ICD-10-CM | POA: Diagnosis not present

## 2018-04-06 DIAGNOSIS — E782 Mixed hyperlipidemia: Secondary | ICD-10-CM | POA: Diagnosis not present

## 2018-04-06 DIAGNOSIS — Z6823 Body mass index (BMI) 23.0-23.9, adult: Secondary | ICD-10-CM | POA: Diagnosis not present

## 2018-06-06 DIAGNOSIS — Z0000 Encounter for general adult medical examination without abnormal findings: Secondary | ICD-10-CM | POA: Diagnosis not present

## 2018-06-06 DIAGNOSIS — I10 Essential (primary) hypertension: Secondary | ICD-10-CM | POA: Diagnosis not present

## 2018-07-28 DIAGNOSIS — I10 Essential (primary) hypertension: Secondary | ICD-10-CM | POA: Diagnosis not present

## 2018-07-28 DIAGNOSIS — N189 Chronic kidney disease, unspecified: Secondary | ICD-10-CM | POA: Diagnosis not present

## 2018-09-08 DIAGNOSIS — E782 Mixed hyperlipidemia: Secondary | ICD-10-CM | POA: Diagnosis not present

## 2018-09-08 DIAGNOSIS — I10 Essential (primary) hypertension: Secondary | ICD-10-CM | POA: Diagnosis not present

## 2018-09-08 DIAGNOSIS — C252 Malignant neoplasm of tail of pancreas: Secondary | ICD-10-CM | POA: Diagnosis not present

## 2018-09-08 DIAGNOSIS — N189 Chronic kidney disease, unspecified: Secondary | ICD-10-CM | POA: Diagnosis not present

## 2018-10-20 DIAGNOSIS — I10 Essential (primary) hypertension: Secondary | ICD-10-CM | POA: Diagnosis not present

## 2018-10-20 DIAGNOSIS — N189 Chronic kidney disease, unspecified: Secondary | ICD-10-CM | POA: Diagnosis not present

## 2018-10-20 DIAGNOSIS — E782 Mixed hyperlipidemia: Secondary | ICD-10-CM | POA: Diagnosis not present

## 2018-12-01 DIAGNOSIS — I10 Essential (primary) hypertension: Secondary | ICD-10-CM | POA: Diagnosis not present

## 2018-12-01 DIAGNOSIS — E782 Mixed hyperlipidemia: Secondary | ICD-10-CM | POA: Diagnosis not present

## 2018-12-01 DIAGNOSIS — G252 Other specified forms of tremor: Secondary | ICD-10-CM | POA: Diagnosis not present

## 2019-01-04 DIAGNOSIS — I10 Essential (primary) hypertension: Secondary | ICD-10-CM | POA: Diagnosis not present

## 2019-01-04 DIAGNOSIS — E782 Mixed hyperlipidemia: Secondary | ICD-10-CM | POA: Diagnosis not present

## 2019-07-17 DIAGNOSIS — N189 Chronic kidney disease, unspecified: Secondary | ICD-10-CM | POA: Diagnosis not present

## 2019-07-17 DIAGNOSIS — E782 Mixed hyperlipidemia: Secondary | ICD-10-CM | POA: Diagnosis not present

## 2019-07-17 DIAGNOSIS — I10 Essential (primary) hypertension: Secondary | ICD-10-CM | POA: Diagnosis not present

## 2019-08-17 DIAGNOSIS — E782 Mixed hyperlipidemia: Secondary | ICD-10-CM | POA: Diagnosis not present

## 2019-08-17 DIAGNOSIS — I10 Essential (primary) hypertension: Secondary | ICD-10-CM | POA: Diagnosis not present

## 2019-08-17 DIAGNOSIS — E871 Hypo-osmolality and hyponatremia: Secondary | ICD-10-CM | POA: Diagnosis not present

## 2019-09-12 DIAGNOSIS — I10 Essential (primary) hypertension: Secondary | ICD-10-CM | POA: Diagnosis not present

## 2019-09-29 DIAGNOSIS — Z720 Tobacco use: Secondary | ICD-10-CM | POA: Diagnosis not present

## 2019-09-29 DIAGNOSIS — I10 Essential (primary) hypertension: Secondary | ICD-10-CM | POA: Diagnosis not present

## 2019-10-24 DIAGNOSIS — I10 Essential (primary) hypertension: Secondary | ICD-10-CM | POA: Diagnosis not present

## 2019-12-28 DIAGNOSIS — N189 Chronic kidney disease, unspecified: Secondary | ICD-10-CM | POA: Diagnosis not present

## 2019-12-28 DIAGNOSIS — R634 Abnormal weight loss: Secondary | ICD-10-CM | POA: Diagnosis not present

## 2019-12-28 DIAGNOSIS — I10 Essential (primary) hypertension: Secondary | ICD-10-CM | POA: Diagnosis not present

## 2020-01-01 ENCOUNTER — Ambulatory Visit: Payer: Medicare Other | Attending: Internal Medicine

## 2020-01-01 DIAGNOSIS — Z23 Encounter for immunization: Secondary | ICD-10-CM

## 2020-01-01 NOTE — Progress Notes (Signed)
   Covid-19 Vaccination Clinic  Name:  Amariyon Maynes    MRN: [id number] DOB: September 01, 1945  01/01/2020  Mr. Dyal was observed post Covid-19 immunization for 15 minutes without incident. He was provided with Vaccine Information Sheet and instruction to access the V-Safe system.   Mr. Leahy was instructed to call [phone] with any severe reactions post vaccine: Marland Kitchen Difficulty breathing  . Swelling of face and throat  . A fast heartbeat  . A bad rash all over body  . Dizziness and weakness

## 2020-06-13 DIAGNOSIS — I10 Essential (primary) hypertension: Secondary | ICD-10-CM | POA: Diagnosis not present

## 2020-06-13 DIAGNOSIS — Z720 Tobacco use: Secondary | ICD-10-CM | POA: Diagnosis not present

## 2020-06-13 DIAGNOSIS — G252 Other specified forms of tremor: Secondary | ICD-10-CM | POA: Diagnosis not present

## 2020-06-13 DIAGNOSIS — Z0000 Encounter for general adult medical examination without abnormal findings: Secondary | ICD-10-CM | POA: Diagnosis not present

## 2020-06-24 DIAGNOSIS — G252 Other specified forms of tremor: Secondary | ICD-10-CM | POA: Diagnosis not present

## 2020-06-24 DIAGNOSIS — I10 Essential (primary) hypertension: Secondary | ICD-10-CM | POA: Diagnosis not present

## 2020-07-30 DIAGNOSIS — I10 Essential (primary) hypertension: Secondary | ICD-10-CM | POA: Diagnosis not present

## 2020-07-30 DIAGNOSIS — Z720 Tobacco use: Secondary | ICD-10-CM | POA: Diagnosis not present

## 2020-09-12 DIAGNOSIS — G253 Myoclonus: Secondary | ICD-10-CM | POA: Diagnosis not present

## 2020-09-12 DIAGNOSIS — E871 Hypo-osmolality and hyponatremia: Secondary | ICD-10-CM | POA: Diagnosis not present

## 2020-09-12 DIAGNOSIS — R634 Abnormal weight loss: Secondary | ICD-10-CM | POA: Diagnosis not present

## 2020-09-12 DIAGNOSIS — E43 Unspecified severe protein-calorie malnutrition: Secondary | ICD-10-CM | POA: Diagnosis not present

## 2020-09-12 DIAGNOSIS — G252 Other specified forms of tremor: Secondary | ICD-10-CM | POA: Diagnosis not present

## 2020-10-09 DIAGNOSIS — R9431 Abnormal electrocardiogram [ECG] [EKG]: Secondary | ICD-10-CM | POA: Diagnosis not present

## 2020-10-09 DIAGNOSIS — I10 Essential (primary) hypertension: Secondary | ICD-10-CM | POA: Diagnosis not present

## 2020-10-17 ENCOUNTER — Emergency Department (HOSPITAL_COMMUNITY)
Admission: EM | Admit: 2020-10-17 | Discharge: 2020-10-18 | Disposition: A | Discharge status: Home / Self Care | Payer: Medicare Other | Attending: Emergency Medicine | Admitting: Emergency Medicine

## 2020-10-17 ENCOUNTER — Other Ambulatory Visit: Payer: Self-pay

## 2020-10-17 DIAGNOSIS — I5032 Chronic diastolic (congestive) heart failure: Secondary | ICD-10-CM | POA: Insufficient documentation

## 2020-10-17 DIAGNOSIS — Z7982 Long term (current) use of aspirin: Secondary | ICD-10-CM | POA: Insufficient documentation

## 2020-10-17 DIAGNOSIS — I110 Hypertensive heart disease with heart failure: Secondary | ICD-10-CM | POA: Insufficient documentation

## 2020-10-17 DIAGNOSIS — I10 Essential (primary) hypertension: Secondary | ICD-10-CM | POA: Diagnosis not present

## 2020-10-17 DIAGNOSIS — E871 Hypo-osmolality and hyponatremia: Secondary | ICD-10-CM | POA: Insufficient documentation

## 2020-10-17 DIAGNOSIS — E43 Unspecified severe protein-calorie malnutrition: Secondary | ICD-10-CM | POA: Diagnosis not present

## 2020-10-17 DIAGNOSIS — R634 Abnormal weight loss: Secondary | ICD-10-CM | POA: Diagnosis not present

## 2020-10-17 DIAGNOSIS — Z79899 Other long term (current) drug therapy: Secondary | ICD-10-CM | POA: Insufficient documentation

## 2020-10-17 DIAGNOSIS — R799 Abnormal finding of blood chemistry, unspecified: Secondary | ICD-10-CM | POA: Diagnosis present

## 2020-10-17 DIAGNOSIS — E876 Hypokalemia: Secondary | ICD-10-CM | POA: Diagnosis not present

## 2020-10-17 DIAGNOSIS — Z6820 Body mass index (BMI) 20.0-20.9, adult: Secondary | ICD-10-CM | POA: Diagnosis not present

## 2020-10-17 DIAGNOSIS — F17210 Nicotine dependence, cigarettes, uncomplicated: Secondary | ICD-10-CM | POA: Diagnosis not present

## 2020-10-17 LAB — CBC WITH DIFFERENTIAL/PLATELET
Abs Immature Granulocytes: 0.04 10*3/uL (ref 0.00–0.07)
Basophils Absolute: 0.1 10*3/uL (ref 0.0–0.1)
Basophils Relative: 1 %
Eosinophils Absolute: 0.6 10*3/uL — ABNORMAL HIGH (ref 0.0–0.5)
Eosinophils Relative: 8 %
Hemoglobin: 14.8 g/dL (ref 13.0–17.0)
Immature Granulocytes: 1 %
Lymphocytes Relative: 42 %
Lymphs Abs: 3.4 10*3/uL (ref 0.7–4.0)
Monocytes Absolute: 0.5 10*3/uL (ref 0.1–1.0)
Monocytes Relative: 7 %
Neutro Abs: 3.4 10*3/uL (ref 1.7–7.7)
Neutrophils Relative %: 41 %
Platelets: 186 10*3/uL (ref 150–400)
WBC: 8.1 10*3/uL (ref 4.0–10.5)

## 2020-10-17 LAB — COMPREHENSIVE METABOLIC PANEL
ALT: 10 U/L (ref 0–44)
AST: 18 U/L (ref 15–41)
Albumin: 3.9 g/dL (ref 3.5–5.0)
Alkaline Phosphatase: 61 U/L (ref 38–126)
Anion gap: 10 (ref 5–15)
BUN: 8 mg/dL (ref 8–23)
CO2: 28 mmol/L (ref 22–32)
Calcium: 8.7 mg/dL — ABNORMAL LOW (ref 8.9–10.3)
Chloride: 84 mmol/L — ABNORMAL LOW (ref 98–111)
Creatinine, Ser: 0.55 mg/dL — ABNORMAL LOW (ref 0.61–1.24)
GFR, Estimated: >60 mL/min (ref >60)
Glucose, Bld: 116 mg/dL — ABNORMAL HIGH (ref 70–99)
Potassium: 2.9 mmol/L — ABNORMAL LOW (ref 3.5–5.1)
Sodium: 122 mmol/L — ABNORMAL LOW (ref 135–145)
Total Bilirubin: 0.4 mg/dL (ref 0.3–1.2)
Total Protein: 6.9 g/dL (ref 6.5–8.1)

## 2020-10-17 LAB — URINALYSIS, ROUTINE W REFLEX MICROSCOPIC
Bilirubin Urine: NEGATIVE
Glucose, UA: NEGATIVE mg/dL
Hgb urine dipstick: NEGATIVE
Ketones, ur: NEGATIVE mg/dL
Leukocytes,Ua: NEGATIVE
Nitrite: NEGATIVE
Protein, ur: NEGATIVE mg/dL
Specific Gravity, Urine: 1.008 (ref 1.005–1.030)
pH: 8 (ref 5.0–8.0)

## 2020-10-17 MED ORDER — SODIUM CHLORIDE 0.9 % IV BOLUS
1000.0000 mL | Freq: Once | INTRAVENOUS | Status: AC
  Administered 2020-10-17: 1000 mL via INTRAVENOUS

## 2020-10-17 MED ORDER — ONDANSETRON HCL 4 MG/2ML IJ SOLN
4.0000 mg | Freq: Once | INTRAMUSCULAR | Status: AC
  Administered 2020-10-17: 4 mg via INTRAVENOUS
  Filled 2020-10-17: qty 2

## 2020-10-17 NOTE — ED Triage Notes (Signed)
Pt reports not feeling well for a couple of days. Went to PCP for same today and sent here for abnormal lab work drawn today: potassium 3.1, Na 123, chloride 83.

## 2020-10-17 NOTE — ED Provider Notes (Signed)
Emergency Medicine Provider Triage Evaluation Note  Miguel Hanson , a 75 y.o. male  was evaluated in triage.  Pt complains of abnormal lab work and "not feeling good."  Patient reports that he has not been feeling good over the last few days.  Patient is unable to give specifics on this feeling.  Patient was sent to emergency department due to abnormal lab work.  Hyponatremia @ 123 , hypochloremia @ 83, and hypokalemia '@3'$ .1.  Review of Systems  Positive: Feeling unwell, urinary frequency Negative: Fevers, chills, dysuria, hematuria, abdominal pain, nausea, vomiting, diarrhea,  Physical Exam  BP 125/87 (BP Location: Right Arm)   Pulse 93   Temp 98 F (36.7 C) (Oral)   Resp 15   SpO2 96%  Gen:   Awake, no distress   Resp:  Normal effort, lungs clear to auscultation bilaterally. MSK:   Moves extremities without difficulty  Other:  Abdomen soft, nondistended, nontender, no guarding or rebound tenderness.  Medical Decision Making  Medically screening exam initiated at 4:15 PM.  Appropriate orders placed.  Authur Laughead was informed that the remainder of the evaluation will be completed by another provider, this initial triage assessment does not replace that evaluation, and the importance of remaining in the ED until their evaluation is complete.  The patient appears stable so that the remainder of the work up may be completed by another provider.      Loni Beckwith, PA-C [id number]    Lianne Cure, DO [id number]

## 2020-10-18 MED ORDER — POTASSIUM CHLORIDE 20 MEQ PO PACK
80.0000 meq | PACK | Freq: Once | ORAL | Status: AC
  Administered 2020-10-18: 80 meq via ORAL
  Filled 2020-10-18: qty 4

## 2020-10-18 MED ORDER — ONDANSETRON 4 MG PO TBDP: ORAL_TABLET | ORAL | 0 refills | Status: AC

## 2020-10-18 NOTE — Discharge Instructions (Addendum)
Your sodium and potassium level were low.  Try to eat something that is high in potassium with each meal until you are seen by your primary care provider.  Try to see them within a week so they can recheck your sodium and potassium levels.  I prescribed you nausea medicine to try and see if that will help you increase your oral intake at home.  Try to increase your oral intake at home that is the most likely cause of your abnormal lab test today.

## 2020-10-18 NOTE — ED Provider Notes (Signed)
[name] EMERGENCY DEPARTMENT Provider Note   CSN: [id number] Arrival date & time: 10/17/20  1603     History Chief Complaint  Patient presents with   Abnormal Lab    Miguel Hanson is a 75 y.o. male.  75 yo M with a chief complaints of abnormal lab work.  Patient states that he has not felt well for a couple days.  He is not sure exactly why.  Has not really felt like eating or drinking.  He went to see his family doctor today and had blood work checked that showed that his sodium was a bit lower than baseline and was sent here for evaluation.  He denies any recent medication change denies nausea vomiting or diarrhea denies cough congestion or fever.  Denies chest pain or pressure.  Denies abdominal pain.  He has a history of hyponatremia and is not sure what the causes.  Used to be a heavy beer drinker but has not drank in some time.  The history is provided by the patient.  Abnormal Lab Illness Severity:  Moderate Onset quality:  Gradual Duration:  2 days Timing:  Constant Progression:  Worsening Chronicity:  New Associated symptoms: no abdominal pain, no chest pain, no congestion, no diarrhea, no fever, no headaches, no myalgias, no rash, no shortness of breath and no vomiting       Past Medical History:  Diagnosis Date   Alcohol abuse    Depression    Drug abuse    ETOH abuse    H/O prolonged Q-T interval on ECG    Hypertension    Polysubstance abuse    PTSD (post-traumatic stress disorder)    PTSD (post-traumatic stress disorder)    "got RX and saw psychiatrist when he went to [facility]; doesn't get treated w/Dr. Criss Rosales" (07/06/2016)   Seizures ([place])     Patient Active Problem List   Diagnosis Date Noted   Essential hypertension    ETOH abuse    Hypokalemia    Hypomagnesemia    Seizure ([place]) 06/27/2015   Thrombocytopenia ([place]) 06/27/2015   Fall 06/27/2015   Convulsions ([place])    Acute arterial ischemic stroke, vertebrobasilar,  cerebellar ([place]) 12/28/2014   Malnutrition of moderate degree 12/28/2014   Aphasia 07/04/2014   Acute encephalopathy 07/04/2014   Encephalopathy acute    Alcohol withdrawal delirium ([facility])    Confusion    Chronic diastolic heart failure, NYHA class 1 ([place]) 05/08/2014   Expressive aphasia 05/08/2014   Macrocytic anemia 05/08/2014   Prolonged Q-T interval on ECG    Hepatic encephalopathy ([place]) 02/25/2014   Protein-calorie malnutrition, severe ([place]) 03/03/2013   Dilantin toxicity 03/01/2013   HTN (hypertension), malignant 12/16/2011   Seizure, convulsive ([place]) 08/11/2011   Altered mental status 08/11/2011   COPD (chronic obstructive pulmonary disease) ([place]) 03/10/2011   Seizure disorder ([place]) 03/08/2011   Alcohol abuse 03/08/2011   PTSD (post-traumatic stress disorder) 03/08/2011    Past Surgical History:  Procedure Laterality Date   NO PAST SURGERIES         Family History  Problem Relation Age of Onset   Hypertension Mother    Hypertension Father     Social History   Tobacco Use   Smoking status: Some Days    Packs/day: 0.50    Years: 49.00    Pack years: 24.50    Types: Cigarettes   Smokeless tobacco: Current  Substance Use Topics   Alcohol use: Yes    Comment: 03/01/2013 "drink a  pint of brandy q 2 days" "Drinks alcohol often"; 07/06/2016 "he drinks alot of beer; quite a bit of brandy; last drink was 2-3 days ago"   Drug use: Yes    Types: Marijuana    Comment: 07/06/2016 per spouse ":1 joint maybe q 2 wks""    Home Medications Prior to Admission medications   Medication Sig Start Date End Date Taking? Authorizing Provider  ondansetron (ZOFRAN ODT) 4 MG disintegrating tablet '4mg'$  ODT q4 hours prn nausea/vomit 10/18/20  Yes Deno Etienne, DO  aspirin 81 MG tablet Take 1 tablet (81 mg total) by mouth daily. 07/08/16   Rice, Resa Miner, MD  atorvastatin (LIPITOR) 40 MG tablet Take 1 tablet (40 mg total) by mouth daily at 6 PM. Patient not taking: Reported on 07/06/2016  09/13/15   Eugenie Filler, MD  feeding supplement, ENSURE ENLIVE, (ENSURE ENLIVE) LIQD Take 237 mLs by mouth 3 (three) times daily between meals. Patient not taking: Reported on 07/06/2016 06/30/15   Cristal Ford, DO  folic acid (FOLVITE) 1 MG tablet Take 1 tablet (1 mg total) by mouth daily. Patient not taking: Reported on 07/06/2016 09/13/15   Eugenie Filler, MD  levETIRAcetam (KEPPRA) 1000 MG tablet Take 1 tablet (1,000 mg total) by mouth 2 (two) times daily. 07/08/16   Rice, Resa Miner, MD  metoprolol tartrate (LOPRESSOR) 25 MG tablet Take 0.5 tablets (12.5 mg total) by mouth 2 (two) times daily. 09/13/15   Eugenie Filler, MD  Multiple Vitamin (MULTIVITAMIN WITH MINERALS) TABS tablet Take 1 tablet by mouth daily. Patient not taking: Reported on 07/06/2016 06/30/15   Cristal Ford, DO  oxcarbazepine (TRILEPTAL) 600 MG tablet Take 1 tablet (600 mg total) by mouth 2 (two) times daily. 07/08/16   Rice, Resa Miner, MD  thiamine 100 MG tablet Take 1 tablet (100 mg total) by mouth daily. Patient not taking: Reported on 07/06/2016 06/30/15   Cristal Ford, DO    Allergies    Patient has no known allergies.  Review of Systems   Review of Systems  Constitutional:  Positive for appetite change. Negative for chills and fever.  HENT:  Negative for congestion and facial swelling.   Eyes:  Negative for discharge and visual disturbance.  Respiratory:  Negative for shortness of breath.   Cardiovascular:  Negative for chest pain and palpitations.  Gastrointestinal:  Negative for abdominal pain, diarrhea and vomiting.  Musculoskeletal:  Negative for arthralgias and myalgias.  Skin:  Negative for color change and rash.  Neurological:  Negative for tremors, syncope and headaches.  Psychiatric/Behavioral:  Negative for confusion and dysphoric mood.    Physical Exam Updated Vital Signs BP (!) 167/80   Pulse (!) 101   Temp 98 F (36.7 C)   Resp 19   Ht '5\' 9"'$  (1.753 m)   Wt 56.7 kg   SpO2  93%   BMI 18.46 kg/m   Physical Exam Vitals and nursing note reviewed.  Constitutional:      Appearance: He is well-developed.     Comments: Chronically ill-appearing  HENT:     Head: Normocephalic and atraumatic.  Eyes:     Pupils: Pupils are equal, round, and reactive to light.  Neck:     Vascular: No JVD.  Cardiovascular:     Rate and Rhythm: Normal rate and regular rhythm.     Heart sounds: No murmur heard.   No friction rub. No gallop.  Pulmonary:     Effort: No respiratory distress.     Breath  sounds: No wheezing.  Abdominal:     General: There is no distension.     Tenderness: There is no abdominal tenderness. There is no guarding or rebound.  Musculoskeletal:        General: Normal range of motion.     Cervical back: Normal range of motion and neck supple.  Skin:    Coloration: Skin is not pale.     Findings: No rash.  Neurological:     Mental Status: He is alert and oriented to person, place, and time.  Psychiatric:        Behavior: Behavior normal.    ED Results / Procedures / Treatments   Labs (all labs ordered are listed, but only abnormal results are displayed) Labs Reviewed  COMPREHENSIVE METABOLIC PANEL - Abnormal; Notable for the following components:      Result Value   Sodium 122 (*)    Potassium 2.9 (*)    Chloride 84 (*)    Glucose, Bld 116 (*)    Creatinine, Ser 0.55 (*)    Calcium 8.7 (*)    All other components within normal limits  CBC WITH DIFFERENTIAL/PLATELET - Abnormal; Notable for the following components:   Eosinophils Absolute 0.6 (*)    All other components within normal limits  URINALYSIS, ROUTINE W REFLEX MICROSCOPIC    EKG EKG Interpretation  Date/Time:  Thursday October 17 2020 21:51:58 EDT Ventricular Rate:  85 PR Interval:  262 QRS Duration: 100 QT Interval:  405 QTC Calculation: 482 R Axis:   74 Text Interpretation: Sinus rhythm Ventricular premature complex Prolonged PR interval Otherwise no significant change  Confirmed by Deno Etienne [phone]) on 10/17/2020 11:01:02 PM  Radiology No results found.  Procedures Procedures   Medications Ordered in ED Medications  sodium chloride 0.9 % bolus 1,000 mL (0 mLs Intravenous Stopped 10/18/20 0045)  ondansetron (ZOFRAN) injection 4 mg (4 mg Intravenous Given 10/17/20 2350)  potassium chloride (KLOR-CON) packet 80 mEq (80 mEq Oral Given 10/18/20 0109)    ED Course  I have reviewed the triage vital signs and the nursing notes.  Pertinent labs & imaging results that were available during my care of the patient were reviewed by me and considered in my medical decision making (see chart for details).    MDM Rules/Calculators/A&P                           75 yo M with a chief complaints of not feeling like eating or drinking.  Going on for the past couple days.  Patient went to see his family doctor today and was noticed to have a sodium level of 123 he was then sent to the ED for evaluation.  Patient sodium is likely due to dehydration with also hypochloridemia.  He has not really been eating or drinking for the past 48 hours.  Shared decision making with the patient offered admission which she is declining.  We will give a bolus of IV fluids here.  Oral trial.  Patient is able to tolerate by mouth without issue.  Fluids given and patient endorses feeling a bit better.  We will have him call his family doctor in the morning.  1:29 AM:  I have discussed the diagnosis/risks/treatment options with the patient and family and believe the pt to be eligible for discharge home to follow-up with PCP. We also discussed returning to the ED immediately if new or worsening sx occur. We discussed the sx which  are most concerning (e.g., sudden worsening pain, fever, inability to tolerate by mouth, confusion, seizures, weakness) that necessitate immediate return. Medications administered to the patient during their visit and any new prescriptions provided to the patient are  listed below.  Medications given during this visit Medications  sodium chloride 0.9 % bolus 1,000 mL (0 mLs Intravenous Stopped 10/18/20 0045)  ondansetron (ZOFRAN) injection 4 mg (4 mg Intravenous Given 10/17/20 2350)  potassium chloride (KLOR-CON) packet 80 mEq (80 mEq Oral Given 10/18/20 0109)     The patient appears reasonably screen and/or stabilized for discharge and I doubt any other medical condition or other [facility] requiring further screening, evaluation, or treatment in the ED at this time prior to discharge.   Final Clinical Impression(s) / ED Diagnoses Final diagnoses:  Hyponatremia  Hypokalemia    Rx / DC Orders ED Discharge Orders          Ordered    ondansetron (ZOFRAN ODT) 4 MG disintegrating tablet        10/18/20 0127             Deno Etienne, DO 10/18/20 0129

## 2020-11-29 DIAGNOSIS — I10 Essential (primary) hypertension: Secondary | ICD-10-CM | POA: Diagnosis not present

## 2020-11-29 DIAGNOSIS — Z720 Tobacco use: Secondary | ICD-10-CM | POA: Diagnosis not present

## 2021-01-29 DIAGNOSIS — I10 Essential (primary) hypertension: Secondary | ICD-10-CM | POA: Diagnosis not present

## 2021-01-29 DIAGNOSIS — Z720 Tobacco use: Secondary | ICD-10-CM | POA: Diagnosis not present

## 2021-01-30 DIAGNOSIS — G252 Other specified forms of tremor: Secondary | ICD-10-CM | POA: Diagnosis not present

## 2021-01-30 DIAGNOSIS — I10 Essential (primary) hypertension: Secondary | ICD-10-CM | POA: Diagnosis not present

## 2021-02-28 DIAGNOSIS — I10 Essential (primary) hypertension: Secondary | ICD-10-CM | POA: Diagnosis not present

## 2021-02-28 DIAGNOSIS — Z720 Tobacco use: Secondary | ICD-10-CM | POA: Diagnosis not present

## 2021-04-02 DIAGNOSIS — G252 Other specified forms of tremor: Secondary | ICD-10-CM | POA: Diagnosis not present

## 2021-04-02 DIAGNOSIS — I10 Essential (primary) hypertension: Secondary | ICD-10-CM | POA: Diagnosis not present

## 2021-04-02 DIAGNOSIS — E782 Mixed hyperlipidemia: Secondary | ICD-10-CM | POA: Diagnosis not present

## 2021-05-30 DIAGNOSIS — I10 Essential (primary) hypertension: Secondary | ICD-10-CM | POA: Diagnosis not present

## 2021-06-30 DIAGNOSIS — I10 Essential (primary) hypertension: Secondary | ICD-10-CM | POA: Diagnosis not present

## 2021-06-30 DIAGNOSIS — Z0000 Encounter for general adult medical examination without abnormal findings: Secondary | ICD-10-CM | POA: Diagnosis not present

## 2021-06-30 DIAGNOSIS — Z720 Tobacco use: Secondary | ICD-10-CM | POA: Diagnosis not present

## 2021-06-30 DIAGNOSIS — I440 Atrioventricular block, first degree: Secondary | ICD-10-CM | POA: Diagnosis not present

## 2021-06-30 DIAGNOSIS — G252 Other specified forms of tremor: Secondary | ICD-10-CM | POA: Diagnosis not present

## 2021-07-23 DIAGNOSIS — I10 Essential (primary) hypertension: Secondary | ICD-10-CM | POA: Diagnosis not present

## 2021-07-23 DIAGNOSIS — E782 Mixed hyperlipidemia: Secondary | ICD-10-CM | POA: Diagnosis not present

## 2021-08-29 DIAGNOSIS — I10 Essential (primary) hypertension: Secondary | ICD-10-CM | POA: Diagnosis not present

## 2021-09-30 DIAGNOSIS — G252 Other specified forms of tremor: Secondary | ICD-10-CM | POA: Diagnosis not present

## 2021-09-30 DIAGNOSIS — Z720 Tobacco use: Secondary | ICD-10-CM | POA: Diagnosis not present

## 2021-09-30 DIAGNOSIS — E782 Mixed hyperlipidemia: Secondary | ICD-10-CM | POA: Diagnosis not present

## 2021-09-30 DIAGNOSIS — I10 Essential (primary) hypertension: Secondary | ICD-10-CM | POA: Diagnosis not present

## 2021-12-18 DIAGNOSIS — H43813 Vitreous degeneration, bilateral: Secondary | ICD-10-CM | POA: Diagnosis not present

## 2021-12-18 DIAGNOSIS — H35342 Macular cyst, hole, or pseudohole, left eye: Secondary | ICD-10-CM | POA: Diagnosis not present

## 2021-12-18 DIAGNOSIS — H2513 Age-related nuclear cataract, bilateral: Secondary | ICD-10-CM | POA: Diagnosis not present

## 2021-12-18 DIAGNOSIS — H35341 Macular cyst, hole, or pseudohole, right eye: Secondary | ICD-10-CM | POA: Diagnosis not present

## 2021-12-31 DIAGNOSIS — I10 Essential (primary) hypertension: Secondary | ICD-10-CM | POA: Diagnosis not present

## 2021-12-31 DIAGNOSIS — E782 Mixed hyperlipidemia: Secondary | ICD-10-CM | POA: Diagnosis not present

## 2022-04-09 DIAGNOSIS — I10 Essential (primary) hypertension: Secondary | ICD-10-CM | POA: Diagnosis not present

## 2022-06-16 DIAGNOSIS — H35341 Macular cyst, hole, or pseudohole, right eye: Secondary | ICD-10-CM | POA: Diagnosis not present

## 2022-06-16 DIAGNOSIS — H35342 Macular cyst, hole, or pseudohole, left eye: Secondary | ICD-10-CM | POA: Diagnosis not present

## 2022-06-16 DIAGNOSIS — H43813 Vitreous degeneration, bilateral: Secondary | ICD-10-CM | POA: Diagnosis not present

## 2022-06-16 DIAGNOSIS — H2513 Age-related nuclear cataract, bilateral: Secondary | ICD-10-CM | POA: Diagnosis not present

## 2022-07-16 DIAGNOSIS — I10 Essential (primary) hypertension: Secondary | ICD-10-CM | POA: Diagnosis not present

## 2022-07-16 DIAGNOSIS — N189 Chronic kidney disease, unspecified: Secondary | ICD-10-CM | POA: Diagnosis not present

## 2022-07-16 DIAGNOSIS — E782 Mixed hyperlipidemia: Secondary | ICD-10-CM | POA: Diagnosis not present

## 2022-10-19 DIAGNOSIS — R627 Adult failure to thrive: Secondary | ICD-10-CM | POA: Diagnosis not present

## 2022-10-19 DIAGNOSIS — R6251 Failure to thrive (child): Secondary | ICD-10-CM | POA: Diagnosis not present

## 2022-10-19 DIAGNOSIS — I10 Essential (primary) hypertension: Secondary | ICD-10-CM | POA: Diagnosis not present

## 2022-10-19 DIAGNOSIS — R634 Abnormal weight loss: Secondary | ICD-10-CM | POA: Diagnosis not present

## 2022-11-23 DIAGNOSIS — N189 Chronic kidney disease, unspecified: Secondary | ICD-10-CM | POA: Diagnosis not present

## 2022-11-23 DIAGNOSIS — E43 Unspecified severe protein-calorie malnutrition: Secondary | ICD-10-CM | POA: Diagnosis not present

## 2022-11-23 DIAGNOSIS — I10 Essential (primary) hypertension: Secondary | ICD-10-CM | POA: Diagnosis not present

## 2022-11-23 DIAGNOSIS — Z23 Encounter for immunization: Secondary | ICD-10-CM | POA: Diagnosis not present

## 2022-12-17 DIAGNOSIS — H35342 Macular cyst, hole, or pseudohole, left eye: Secondary | ICD-10-CM | POA: Diagnosis not present

## 2022-12-17 DIAGNOSIS — H35341 Macular cyst, hole, or pseudohole, right eye: Secondary | ICD-10-CM | POA: Diagnosis not present

## 2022-12-17 DIAGNOSIS — H2513 Age-related nuclear cataract, bilateral: Secondary | ICD-10-CM | POA: Diagnosis not present

## 2022-12-17 DIAGNOSIS — H43813 Vitreous degeneration, bilateral: Secondary | ICD-10-CM | POA: Diagnosis not present

## 2023-01-04 DIAGNOSIS — R634 Abnormal weight loss: Secondary | ICD-10-CM | POA: Diagnosis not present

## 2023-01-04 DIAGNOSIS — I10 Essential (primary) hypertension: Secondary | ICD-10-CM | POA: Diagnosis not present

## 2023-03-15 DIAGNOSIS — E782 Mixed hyperlipidemia: Secondary | ICD-10-CM | POA: Diagnosis not present

## 2023-03-15 DIAGNOSIS — E43 Unspecified severe protein-calorie malnutrition: Secondary | ICD-10-CM | POA: Diagnosis not present

## 2023-03-15 DIAGNOSIS — N189 Chronic kidney disease, unspecified: Secondary | ICD-10-CM | POA: Diagnosis not present

## 2023-03-15 DIAGNOSIS — I129 Hypertensive chronic kidney disease with stage 1 through stage 4 chronic kidney disease, or unspecified chronic kidney disease: Secondary | ICD-10-CM | POA: Diagnosis not present

## 2023-03-15 DIAGNOSIS — I10 Essential (primary) hypertension: Secondary | ICD-10-CM | POA: Diagnosis not present

## 2023-05-24 DIAGNOSIS — R3912 Poor urinary stream: Secondary | ICD-10-CM | POA: Diagnosis not present

## 2023-06-01 DIAGNOSIS — I129 Hypertensive chronic kidney disease with stage 1 through stage 4 chronic kidney disease, or unspecified chronic kidney disease: Secondary | ICD-10-CM | POA: Diagnosis not present

## 2023-06-01 DIAGNOSIS — E874 Mixed disorder of acid-base balance: Secondary | ICD-10-CM | POA: Diagnosis not present

## 2023-06-01 DIAGNOSIS — E876 Hypokalemia: Secondary | ICD-10-CM | POA: Diagnosis not present

## 2023-08-03 DIAGNOSIS — I10 Essential (primary) hypertension: Secondary | ICD-10-CM | POA: Diagnosis not present

## 2023-09-27 DIAGNOSIS — R3912 Poor urinary stream: Secondary | ICD-10-CM | POA: Diagnosis not present

## 2023-10-14 DIAGNOSIS — Z1211 Encounter for screening for malignant neoplasm of colon: Secondary | ICD-10-CM | POA: Diagnosis not present

## 2023-10-14 DIAGNOSIS — Z1212 Encounter for screening for malignant neoplasm of rectum: Secondary | ICD-10-CM | POA: Diagnosis not present

## 2023-12-06 DIAGNOSIS — N189 Chronic kidney disease, unspecified: Secondary | ICD-10-CM | POA: Diagnosis not present

## 2023-12-06 DIAGNOSIS — I129 Hypertensive chronic kidney disease with stage 1 through stage 4 chronic kidney disease, or unspecified chronic kidney disease: Secondary | ICD-10-CM | POA: Diagnosis not present

## 2023-12-14 DIAGNOSIS — H35341 Macular cyst, hole, or pseudohole, right eye: Secondary | ICD-10-CM | POA: Diagnosis not present

## 2023-12-14 DIAGNOSIS — H35342 Macular cyst, hole, or pseudohole, left eye: Secondary | ICD-10-CM | POA: Diagnosis not present

## 2023-12-14 DIAGNOSIS — H43813 Vitreous degeneration, bilateral: Secondary | ICD-10-CM | POA: Diagnosis not present
